# Patient Record
Sex: Female | Born: 1965
Health system: Southern US, Community
[De-identification: ages and names within clinical notes are randomized; demographics above are authoritative.]

## PROBLEM LIST (undated history)

## (undated) DIAGNOSIS — E079 Disorder of thyroid, unspecified: Secondary | ICD-10-CM

## (undated) DIAGNOSIS — A6920 Lyme disease, unspecified: Secondary | ICD-10-CM

## (undated) DIAGNOSIS — I10 Essential (primary) hypertension: Secondary | ICD-10-CM

## (undated) DIAGNOSIS — F419 Anxiety disorder, unspecified: Secondary | ICD-10-CM

## (undated) DIAGNOSIS — E7800 Pure hypercholesterolemia, unspecified: Secondary | ICD-10-CM

## (undated) HISTORY — PX: MASTOIDECTOMY: SHX711

## (undated) HISTORY — PX: ABDOMINAL EXPLORATION SURGERY: SHX538

## (undated) HISTORY — DX: Lyme disease, unspecified: A69.20

## (undated) HISTORY — PX: APPENDECTOMY: SHX54

## (undated) HISTORY — PX: TONSILECTOMY/ADENOIDECTOMY WITH MYRINGOTOMY: SHX6125

---

## 1997-12-02 ENCOUNTER — Inpatient Hospital Stay (HOSPITAL_COMMUNITY): Admission: AD | Admit: 1997-12-02 | Discharge: 1997-12-02 | Payer: Self-pay | Admitting: Obstetrics and Gynecology

## 2009-04-11 ENCOUNTER — Emergency Department (HOSPITAL_BASED_OUTPATIENT_CLINIC_OR_DEPARTMENT_OTHER): Admission: EM | Admit: 2009-04-11 | Discharge: 2009-04-12 | Payer: Self-pay | Admitting: Emergency Medicine

## 2009-04-12 ENCOUNTER — Ambulatory Visit: Payer: Self-pay | Admitting: Diagnostic Radiology

## 2010-10-09 LAB — URINALYSIS, ROUTINE W REFLEX MICROSCOPIC
Bilirubin Urine: NEGATIVE
Glucose, UA: NEGATIVE mg/dL
Hgb urine dipstick: NEGATIVE
Protein, ur: NEGATIVE mg/dL
Urobilinogen, UA: 0.2 mg/dL (ref 0.0–1.0)
pH: 6.5 (ref 5.0–8.0)

## 2010-10-09 LAB — BASIC METABOLIC PANEL
Chloride: 98 mEq/L (ref 96–112)
GFR calc Af Amer: >60 mL/min (ref >60)
Glucose, Bld: 94 mg/dL (ref 70–99)
Sodium: 136 mEq/L (ref 135–145)

## 2010-10-09 LAB — CBC
HCT: 41.5 % (ref 36.0–46.0)
Hemoglobin: 14.3 g/dL (ref 12.0–15.0)
Platelets: 306 10*3/uL (ref 150–400)
WBC: 7.5 10*3/uL (ref 4.0–10.5)

## 2010-10-09 LAB — DIFFERENTIAL
Basophils Relative: 1 % (ref 0–1)
Eosinophils Relative: 2 % (ref 0–5)

## 2010-10-09 LAB — WET PREP, GENITAL

## 2010-10-09 LAB — GC/CHLAMYDIA PROBE AMP, GENITAL: Chlamydia, DNA Probe: NEGATIVE

## 2011-06-17 ENCOUNTER — Ambulatory Visit: Payer: Self-pay | Admitting: Pulmonary Disease

## 2011-08-11 ENCOUNTER — Ambulatory Visit: Payer: Self-pay | Admitting: Pulmonary Disease

## 2014-08-25 ENCOUNTER — Emergency Department (HOSPITAL_BASED_OUTPATIENT_CLINIC_OR_DEPARTMENT_OTHER)
Admission: EM | Admit: 2014-08-25 | Discharge: 2014-08-25 | Disposition: A | Discharge status: Home / Self Care | Payer: 59 | Attending: Emergency Medicine | Admitting: Emergency Medicine

## 2014-08-25 ENCOUNTER — Encounter (HOSPITAL_BASED_OUTPATIENT_CLINIC_OR_DEPARTMENT_OTHER): Payer: Self-pay | Admitting: *Deleted

## 2014-08-25 DIAGNOSIS — Z79899 Other long term (current) drug therapy: Secondary | ICD-10-CM | POA: Insufficient documentation

## 2014-08-25 DIAGNOSIS — E079 Disorder of thyroid, unspecified: Secondary | ICD-10-CM | POA: Diagnosis not present

## 2014-08-25 DIAGNOSIS — R112 Nausea with vomiting, unspecified: Secondary | ICD-10-CM

## 2014-08-25 DIAGNOSIS — M791 Myalgia, unspecified site: Secondary | ICD-10-CM

## 2014-08-25 DIAGNOSIS — E875 Hyperkalemia: Secondary | ICD-10-CM | POA: Diagnosis not present

## 2014-08-25 HISTORY — DX: Disorder of thyroid, unspecified: E07.9

## 2014-08-25 LAB — CBC WITH DIFFERENTIAL/PLATELET
BASOS PCT: 0 % (ref 0–1)
Basophils Absolute: 0 10*3/uL (ref 0.0–0.1)
EOS ABS: 0.2 10*3/uL (ref 0.0–0.7)
Eosinophils Relative: 2 % (ref 0–5)
HCT: 44.2 % (ref 36.0–46.0)
HEMOGLOBIN: 15 g/dL (ref 12.0–15.0)
LYMPHS ABS: 2.2 10*3/uL (ref 0.7–4.0)
LYMPHS PCT: 20 % (ref 12–46)
MCH: 30.2 pg (ref 26.0–34.0)
MCHC: 33.9 g/dL (ref 30.0–36.0)
MCV: 88.9 fL (ref 78.0–100.0)
MONO ABS: 0.5 10*3/uL (ref 0.1–1.0)
Monocytes Relative: 5 % (ref 3–12)
Neutro Abs: 7.8 10*3/uL — ABNORMAL HIGH (ref 1.7–7.7)
Neutrophils Relative %: 73 % (ref 43–77)
Platelets: 320 10*3/uL (ref 150–400)
RBC: 4.97 MIL/uL (ref 3.87–5.11)
RDW: 13.5 % (ref 11.5–15.5)
WBC: 10.7 10*3/uL — ABNORMAL HIGH (ref 4.0–10.5)

## 2014-08-25 LAB — BASIC METABOLIC PANEL
ANION GAP: 4 — AB (ref 5–15)
BUN: 21 mg/dL (ref 6–23)
CALCIUM: 10 mg/dL (ref 8.4–10.5)
CO2: 31 mmol/L (ref 19–32)
Chloride: 102 mmol/L (ref 96–112)
Creatinine, Ser: 0.9 mg/dL (ref 0.50–1.10)
GFR calc non Af Amer: 74 mL/min — ABNORMAL LOW (ref >90)
GFR, EST AFRICAN AMERICAN: 86 mL/min — AB (ref >90)
GLUCOSE: 103 mg/dL — AB (ref 70–99)
POTASSIUM: 5.2 mmol/L — AB (ref 3.5–5.1)
Sodium: 137 mmol/L (ref 135–145)

## 2014-08-25 LAB — URINALYSIS, ROUTINE W REFLEX MICROSCOPIC
BILIRUBIN URINE: NEGATIVE
GLUCOSE, UA: NEGATIVE mg/dL
HGB URINE DIPSTICK: NEGATIVE
Ketones, ur: NEGATIVE mg/dL
LEUKOCYTES UA: NEGATIVE
Nitrite: NEGATIVE
PH: 6.5 (ref 5.0–8.0)
PROTEIN: NEGATIVE mg/dL
Specific Gravity, Urine: 1.006 (ref 1.005–1.030)
UROBILINOGEN UA: 0.2 mg/dL (ref 0.0–1.0)

## 2014-08-25 LAB — MONONUCLEOSIS SCREEN: Mono Screen: NEGATIVE

## 2014-08-25 MED ORDER — PROMETHAZINE HCL 25 MG PO TABS: 25.0000 mg | ORAL_TABLET | Freq: Four times a day (QID) | ORAL | Status: DC | PRN

## 2014-08-25 MED ORDER — PROMETHAZINE HCL 25 MG PO TABS
25.0000 mg | ORAL_TABLET | Freq: Once | ORAL | Status: AC
  Administered 2014-08-25: 25 mg via ORAL
  Filled 2014-08-25: qty 1

## 2014-08-25 NOTE — ED Notes (Signed)
C/o weakness and vomiting. Strep throat 2 weeks ago. Weakness started Tuesday. Pt was zithromax for strep throat. Also c/o diarrhea.

## 2014-08-25 NOTE — ED Provider Notes (Signed)
CSN: [id number]     Arrival date & time 08/25/14  1625 History   This chart was scribed for No att. providers found by Jarvis Morgan, ED Scribe. This patient was seen in room MH08/MH08 and the patient's care was started at 12:14 AM.    Chief Complaint  Patient presents with  . Emesis    Patient is a 49 y.o. female presenting with vomiting. The history is provided by the patient. No language interpreter was used.  Emesis Severity:  Moderate Duration:  2 weeks Timing:  Intermittent Able to tolerate:  Liquids Progression:  Worsening Chronicity:  New Recent urination:  Normal Relieved by:  Nothing Worsened by:  Nothing tried Ineffective treatments:  None tried Associated symptoms: chills, cough, diarrhea, headaches, myalgias and sore throat   Associated symptoms: no abdominal pain, no arthralgias, no fever and no URI     HPI Comments: Jenna Arroyo is a 49 y.o. female who presents to the Emergency Department complaining of episodic emesis for 2 weeks. Pt states that 2 weeks ago she had strep throat and was given Zithromax. She was having associated headaches, emesis, and diarrhea. She states that she took the Zithromax for 4 days and started feeling great again and then she states that about 4 days ago she began feeling bad again. She is currently having the same symptoms as she had previously along with fatigue, chills, subjective fever, mild non productive cough, generalized body aches. Her last episode of emesis was last night She was given a refill of 500 mg Zithromax and began to feel her previous symptoms again and having diaphoresis and heart palpitations. She went off her thyroid medication to see if that helped at all with how she felt. She also notes she has been having spiking BP readings. 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 She has been having some sick contacts at work. She denies any other complaints at this time.   Past Medical History   Diagnosis Date  . Thyroid disease    History reviewed. No pertinent past surgical history. No family history on file. History  Substance Use Topics  . Smoking status: Never Smoker   . Smokeless tobacco: Not on file  . Alcohol Use: Not on file   OB History    No data available     Review of Systems  Constitutional: Positive for chills and fatigue. Negative for fever, diaphoresis, activity change and appetite change.  HENT: Positive for sore throat. Negative for congestion, facial swelling and rhinorrhea.   Eyes: Negative for photophobia and discharge.  Respiratory: Positive for cough. Negative for chest tightness and shortness of breath.   Cardiovascular: Negative for chest pain, palpitations and leg swelling.  Gastrointestinal: Positive for vomiting and diarrhea. Negative for nausea and abdominal pain.  Endocrine: Negative for polydipsia and polyuria.  Genitourinary: Negative for dysuria, frequency, difficulty urinating and pelvic pain.  Musculoskeletal: Positive for myalgias. Negative for back pain, arthralgias, neck pain and neck stiffness.  Skin: Negative for color change and wound.  Allergic/Immunologic: Negative for immunocompromised state.  Neurological: Positive for light-headedness and headaches. Negative for facial asymmetry, weakness and numbness.  Hematological: Does not bruise/bleed easily.  Psychiatric/Behavioral: Negative for confusion and agitation.      Allergies  Rocephin  Home Medications   Prior to Admission medications   Medication Sig Start Date End Date Taking? Authorizing Provider  ALPRAZolam Prudy Feeler) 0.5 MG tablet Take 0.5 mg by mouth 3 (three) times daily  as needed for anxiety.   Yes Historical Provider, MD  atenolol (TENORMIN) 25 MG tablet Take 25 mg by mouth 2 (two) times daily.   Yes Historical Provider, MD  DULoxetine (CYMBALTA) 60 MG capsule Take 60 mg by mouth daily.   Yes Historical Provider, MD  thyroid (ARMOUR) 32.5 MG tablet Take 32.5 mg  by mouth daily.   Yes Historical Provider, MD  promethazine (PHENERGAN) 25 MG tablet Take 1 tablet (25 mg total) by mouth every 6 (six) hours as needed for nausea or vomiting. 08/25/14   Toy Cookey, MD   Triage Vitals: BP 139/105 mmHg  Pulse 73  Temp(Src) 98.2 F (36.8 C) (Oral)  Resp 16  Ht  (1.676 m)  Wt 204 lb (92.534 kg)  BMI 32.94 kg/m2  SpO2 100%  LMP   Physical Exam  Constitutional: She is oriented to person, place, and time. She appears well-developed and well-nourished. No distress.  HENT:  Head: Normocephalic.  Mouth/Throat: Oropharynx is clear and moist.  Small effusion behind right TM  Eyes: Pupils are equal, round, and reactive to light.  Neck: Neck supple.  Cardiovascular: Normal rate, regular rhythm and normal heart sounds.   Pulmonary/Chest: Effort normal and breath sounds normal. No respiratory distress. She has no wheezes.  Abdominal: Soft. She exhibits no distension. There is no tenderness. There is no rebound and no guarding.  Musculoskeletal: She exhibits no edema or tenderness.  Lymphadenopathy:    She has cervical adenopathy.  Neurological: She is alert and oriented to person, place, and time.  Skin: Skin is warm and dry.  Psychiatric: She has a normal mood and affect.  Nursing note and vitals reviewed.   ED Course  Procedures (including critical care time)  DIAGNOSTIC STUDIES: Oxygen Saturation is 100% on RA, normal by my interpretation.    COORDINATION OF CARE: 8:24 PM- Will order diagnostic lab work and 12-lead EKG.  Pt advised of plan for treatment and pt agrees.     Labs Review Labs Reviewed  CBC WITH DIFFERENTIAL/PLATELET - Abnormal; Notable for the following:    WBC 10.7 (*)    Neutro Abs 7.8 (*)    All other components within normal limits  BASIC METABOLIC PANEL - Abnormal; Notable for the following:    Potassium 5.2 (*)    Glucose, Bld 103 (*)    GFR calc non Af Amer 74 (*)    GFR calc Af Amer 86 (*)    Anion gap 4 (*)     All other components within normal limits  URINE CULTURE  URINALYSIS, ROUTINE W REFLEX MICROSCOPIC  MONONUCLEOSIS SCREEN  TSH  T4, FREE  T4, FREE    Imaging Review No results found.   EKG Interpretation   Date/Time:  Saturday August 25 2014 18:33:24 EST Ventricular Rate:  66 PR Interval:  114 QRS Duration: 88 QT Interval:  382 QTC Calculation: 400 R Axis:   52 Text Interpretation:  Normal sinus rhythm Normal ECG No prior for  comparison Confirmed by Devaney Segers  MD, Elzie Sheets ([id number]) on 08/25/2014 7:30:01 PM      MDM   Final diagnoses:  Non-intractable vomiting with nausea, vomiting of unspecified type  Myalgia  Hyperkalemia    Patient presents with multiple complaints including intermittent vomiting, generalized weakness, malaise.  She had strep throat 2 weeks ago and was treated with azithromycin.  She is also had some intermittent loose stools.  She denies fevers.  On physical exam, vital signs are stable.  She is no acute  distress.  Physical exam grossly unremarkable.  Monospot is negative.  CBC with mild leukocytosis.  BMP with a K of 5.2 without hemolysis, but a normal creatinine.  She has had similar potassiums in the past.  She has no EKG changes.  I suspect an acute viral syndrome.  We'll discharge home with instructions for supportive care as well as Phenergan for nausea or vomiting.  Asked her to follow-up with her primary care doctor for a repeat potassium in one week.   .  She'll follow-up with her primary doctor for results of TSH and free T4  Toy CookeyMegan Szymon Foiles, MD 08/26/14 [id number]

## 2014-08-25 NOTE — ED Notes (Signed)
Pt alert x4 respirations easy non labored.  

## 2014-08-25 NOTE — ED Notes (Signed)
Pt alert x4 mae randomly. Pt c/o generalized fatigue

## 2014-08-25 NOTE — Discharge Instructions (Signed)

## 2014-08-26 LAB — TSH: TSH: 4.116 u[IU]/mL (ref 0.350–4.500)

## 2014-08-26 LAB — T4, FREE: FREE T4: 0.99 ng/dL (ref 0.80–1.80)

## 2014-08-27 LAB — URINE CULTURE

## 2014-08-30 LAB — T4, FREE

## 2015-11-07 DIAGNOSIS — Z23 Encounter for immunization: Secondary | ICD-10-CM | POA: Diagnosis not present

## 2015-12-12 DIAGNOSIS — J45909 Unspecified asthma, uncomplicated: Secondary | ICD-10-CM | POA: Diagnosis not present

## 2015-12-12 DIAGNOSIS — J209 Acute bronchitis, unspecified: Secondary | ICD-10-CM | POA: Diagnosis not present

## 2016-02-06 DIAGNOSIS — J029 Acute pharyngitis, unspecified: Secondary | ICD-10-CM | POA: Diagnosis not present

## 2016-03-12 DIAGNOSIS — F411 Generalized anxiety disorder: Secondary | ICD-10-CM | POA: Diagnosis not present

## 2016-04-02 DIAGNOSIS — M2550 Pain in unspecified joint: Secondary | ICD-10-CM | POA: Diagnosis not present

## 2016-04-02 DIAGNOSIS — M545 Low back pain: Secondary | ICD-10-CM | POA: Diagnosis not present

## 2016-04-02 DIAGNOSIS — I119 Hypertensive heart disease without heart failure: Secondary | ICD-10-CM | POA: Diagnosis not present

## 2016-04-02 DIAGNOSIS — E784 Other hyperlipidemia: Secondary | ICD-10-CM | POA: Diagnosis not present

## 2016-05-06 DIAGNOSIS — F329 Major depressive disorder, single episode, unspecified: Secondary | ICD-10-CM | POA: Diagnosis not present

## 2016-05-06 DIAGNOSIS — F419 Anxiety disorder, unspecified: Secondary | ICD-10-CM | POA: Diagnosis not present

## 2016-05-06 DIAGNOSIS — I10 Essential (primary) hypertension: Secondary | ICD-10-CM | POA: Diagnosis not present

## 2016-05-06 DIAGNOSIS — R112 Nausea with vomiting, unspecified: Secondary | ICD-10-CM | POA: Diagnosis not present

## 2016-05-06 DIAGNOSIS — R079 Chest pain, unspecified: Secondary | ICD-10-CM | POA: Diagnosis not present

## 2016-05-06 DIAGNOSIS — Z885 Allergy status to narcotic agent status: Secondary | ICD-10-CM | POA: Diagnosis not present

## 2016-05-06 DIAGNOSIS — E039 Hypothyroidism, unspecified: Secondary | ICD-10-CM | POA: Diagnosis not present

## 2016-05-06 DIAGNOSIS — R0789 Other chest pain: Secondary | ICD-10-CM | POA: Diagnosis not present

## 2016-05-06 DIAGNOSIS — Z780 Asymptomatic menopausal state: Secondary | ICD-10-CM | POA: Diagnosis not present

## 2016-05-06 DIAGNOSIS — R002 Palpitations: Secondary | ICD-10-CM | POA: Diagnosis not present

## 2016-05-06 DIAGNOSIS — E782 Mixed hyperlipidemia: Secondary | ICD-10-CM | POA: Diagnosis not present

## 2016-05-12 DIAGNOSIS — E784 Other hyperlipidemia: Secondary | ICD-10-CM | POA: Diagnosis not present

## 2016-05-12 DIAGNOSIS — E559 Vitamin D deficiency, unspecified: Secondary | ICD-10-CM | POA: Diagnosis not present

## 2016-05-12 DIAGNOSIS — R5382 Chronic fatigue, unspecified: Secondary | ICD-10-CM | POA: Diagnosis not present

## 2016-05-12 DIAGNOSIS — I509 Heart failure, unspecified: Secondary | ICD-10-CM | POA: Diagnosis not present

## 2016-05-13 DIAGNOSIS — F418 Other specified anxiety disorders: Secondary | ICD-10-CM | POA: Diagnosis not present

## 2016-05-13 DIAGNOSIS — R5383 Other fatigue: Secondary | ICD-10-CM | POA: Diagnosis not present

## 2016-05-13 DIAGNOSIS — I10 Essential (primary) hypertension: Secondary | ICD-10-CM | POA: Diagnosis not present

## 2016-05-13 DIAGNOSIS — E785 Hyperlipidemia, unspecified: Secondary | ICD-10-CM | POA: Diagnosis not present

## 2016-05-13 DIAGNOSIS — E559 Vitamin D deficiency, unspecified: Secondary | ICD-10-CM | POA: Diagnosis not present

## 2016-05-13 DIAGNOSIS — M2550 Pain in unspecified joint: Secondary | ICD-10-CM | POA: Diagnosis not present

## 2016-05-13 DIAGNOSIS — E063 Autoimmune thyroiditis: Secondary | ICD-10-CM | POA: Diagnosis not present

## 2016-05-13 DIAGNOSIS — E039 Hypothyroidism, unspecified: Secondary | ICD-10-CM | POA: Diagnosis not present

## 2016-05-25 DIAGNOSIS — E042 Nontoxic multinodular goiter: Secondary | ICD-10-CM | POA: Diagnosis not present

## 2016-05-25 DIAGNOSIS — E063 Autoimmune thyroiditis: Secondary | ICD-10-CM | POA: Diagnosis not present

## 2016-06-18 ENCOUNTER — Ambulatory Visit (INDEPENDENT_AMBULATORY_CARE_PROVIDER_SITE_OTHER): Payer: BLUE CROSS/BLUE SHIELD | Admitting: Family Medicine

## 2016-06-18 ENCOUNTER — Encounter: Payer: Self-pay | Admitting: Family Medicine

## 2016-06-18 VITALS — BP 142/92 | HR 74 | Temp 98.2°F | Ht 66.0 in | Wt 227.2 lb

## 2016-06-18 DIAGNOSIS — I10 Essential (primary) hypertension: Secondary | ICD-10-CM | POA: Insufficient documentation

## 2016-06-18 DIAGNOSIS — E039 Hypothyroidism, unspecified: Secondary | ICD-10-CM | POA: Diagnosis not present

## 2016-06-18 DIAGNOSIS — E785 Hyperlipidemia, unspecified: Secondary | ICD-10-CM | POA: Diagnosis not present

## 2016-06-18 DIAGNOSIS — R1013 Epigastric pain: Secondary | ICD-10-CM

## 2016-06-18 NOTE — Progress Notes (Addendum)
[facility] [address], [address] [place], [place] [address] [phone] [phone]  Date:  06/18/2016   Name:  Jenna Arroyo   DOB:  10/17/1965   MRN:  [id number]  PCP:  Abbe AmsterdamOPLAND,Deshara Rossi, MD    Chief Complaint: Establish Care   History of Present Illness:  Jenna Arroyo is a 50 y.o. very pleasant female patient who presents with the following:  Here today to establish care- [place] too far [place] for her She has been seeing a cardiologist for her lipids- we think with [facility] She has had a lot of trouble with statins in the past- she is going to try pravastatin, and if she fails this they will try an injectable.  She had a stress last spring which was normal.  She had lyme disease in the past- ever since then she seemed to have an underactive thyroid.  She has acquired hypothyroidism. This is managed per [facility]- apparently they are also "trying to get a positive lyme test so they can treat me for chronic lyme."    She did not take her atenolol this morning  She takes cymbalta, and she is trying to taper off her xanax. She is taking 3 a day right now- down from a high of 5 pills  She has dx of "hiatal hernia." She notes epigastric pains and more "acid in my stomach," she will feel bloated after eating.  She is not on any anti-reflux medications.  She has tried tums, and zantac on occasion This has bothered her more for 2-3 weeks but has been chronic off and on Pt reports that her chiropractor diagnosed her hiatal hernia many years ago by feeling her stomach- she did not know that this cannot be reliably dx on physical exam. Never had any imaging that confirmed this hernia.   Decided to get an h pylori breath test for her and treat accordingly      There are no active problems to display for this patient.   Past Medical History:  Diagnosis Date  . Thyroid disease     History  reviewed. No pertinent surgical history.  Social History  Substance Use Topics  . Smoking status: Never Smoker  . Smokeless tobacco: Never Used  . Alcohol use No    Family History  Problem Relation Age of Onset  . Hypertension Mother   . Heart disease Father   . Hypertension Sister   . Hypertension Brother     Allergies  Allergen Reactions  . Ceftriaxone Sodium In Dextrose Other (See Comments)    "heart races and cannot breathe"  . Guaifenesin Other (See Comments)    "heart races, dizziness, light-headedness"  . Guaifenesin & Derivatives Other (See Comments)    "heart races, dizziness, light-headedness"  . Meperidine Other (See Comments)    "heart races"  . Other Rash  . Pseudoephedrine Other (See Comments)    "heart races and high blood pressure"  . Rosuvastatin Calcium Nausea And Vomiting    Muscle aches   . Simvastatin Other (See Comments)    muscle aching   . Ciprofloxacin   . Rocephin [Ceftriaxone] Itching    Medication list has been reviewed and updated.  Current Outpatient Prescriptions on File Prior to Visit  Medication Sig Dispense Refill  . ALPRAZolam (XANAX) 0.5 MG tablet Take 0.5 mg by mouth 3 (three) times daily as needed for anxiety.    .Marland Kitchen  atenolol (TENORMIN) 25 MG tablet Take 25 mg by mouth 2 (two) times daily.    . DULoxetine (CYMBALTA) 60 MG capsule Take 60 mg by mouth daily.    . promethazine (PHENERGAN) 25 MG tablet Take 1 tablet (25 mg total) by mouth every 6 (six) hours as needed for nausea or vomiting. 15 tablet 0  . thyroid (ARMOUR) 32.5 MG tablet Take 32.5 mg by mouth daily.     No current facility-administered medications on file prior to visit.     Review of Systems:  As per HPI- otherwise negative.   Physical Examination: Vitals:   06/18/16 1529  BP: (!) 142/92  Pulse: 74  Temp: 98.2 F (36.8 C)   Vitals:   06/18/16 1529  Weight: 227 lb 3.2 oz (103.1 kg)  Height: 5\' 6"  (1.676 m)   Body mass index is 36.67 kg/m. Ideal  Body Weight: Weight in (lb) to have BMI = 25: 154.6  GEN: WDWN, NAD, Non-toxic, A & O x 3, obese, looks well HEENT: Atraumatic, Normocephalic. Neck supple. No masses, No LAD.  Bilateral TM wnl, oropharynx normal.  PEERL,EOMI.   Ears and Nose: No external deformity. CV: RRR, No M/G/R. No JVD. No thrill. No extra heart sounds. PULM: CTA B, no wheezes, crackles, rhonchi. No retractions. No resp. distress. No accessory muscle use. ABD: S, NT, ND, +BS. No rebound. No HSM. EXTR: No c/c/e NEURO Normal gait.  PSYCH: Normally interactive. Conversant. Not depressed or anxious appearing.  Calm demeanor.    Assessment and Plan: Abdominal pain, epigastric - Plan: H. pylori breath test  Dyslipidemia  Essential hypertension, benign  Acquired hypothyroidism  Here today as a new patient as has last PCP have to move too far away Her BP is under reasonable control- continue current regimen It seems that she was dx with hiatal hernia on a physical exam only years ago by her chiropractor. Advised pt that per my knowledge this cannot be dx on physical exam alone. Will do an H pylori due to her epigastric symptoms. If negative would suggest that we have her see GI. She is ok with this plan   Signed Abbe AmsterdamJessica Kamaile Zachow, MD  Results for orders placed or performed in visit on 06/18/16  H. pylori breath test  Result Value Ref Range   H. pylori Breath Test NOT DETECTED Not Detected   Negative H pylori- called pt on 12/20. Will refer to GI

## 2016-06-18 NOTE — Progress Notes (Signed)
Pre visit review using our clinic review tool, if applicable. No additional management support is needed unless otherwise documented below in the visit note. 

## 2016-06-18 NOTE — Patient Instructions (Signed)
It was very nice to see you today- let me know if you need anything, but it sounds like your thyroid and cholesterol are being managed by [facility] We will do a breath test today to look for H pylori; this is a bacteria that can cause gastritis and ulcers.  If this is negative I would recommend that we have you see GI to evaluate your abdominal pain. You might need an upper GI scope to determine if you actually have a hiatal hernia   I will be in touch with your breath test results asap

## 2016-06-19 LAB — H. PYLORI BREATH TEST: H. PYLORI BREATH TEST: NOT DETECTED

## 2016-06-24 NOTE — Addendum Note (Signed)
Addended by: Abbe AmsterdamOPLAND, Winna Golla C on: 06/24/2016 12:40 PM   Modules accepted: Orders

## 2016-06-25 ENCOUNTER — Encounter: Payer: Self-pay | Admitting: Family Medicine

## 2016-06-25 DIAGNOSIS — R1013 Epigastric pain: Secondary | ICD-10-CM | POA: Diagnosis not present

## 2016-06-25 DIAGNOSIS — Z1211 Encounter for screening for malignant neoplasm of colon: Secondary | ICD-10-CM | POA: Diagnosis not present

## 2016-06-25 NOTE — Telephone Encounter (Signed)
error:[id number] ° °

## 2016-06-28 DIAGNOSIS — M5432 Sciatica, left side: Secondary | ICD-10-CM | POA: Diagnosis not present

## 2016-06-28 DIAGNOSIS — S39012A Strain of muscle, fascia and tendon of lower back, initial encounter: Secondary | ICD-10-CM | POA: Diagnosis not present

## 2016-07-09 DIAGNOSIS — G8929 Other chronic pain: Secondary | ICD-10-CM | POA: Diagnosis not present

## 2016-07-09 DIAGNOSIS — M25561 Pain in right knee: Secondary | ICD-10-CM | POA: Diagnosis not present

## 2016-07-17 ENCOUNTER — Encounter: Payer: Self-pay | Admitting: Family Medicine

## 2016-07-17 ENCOUNTER — Ambulatory Visit (INDEPENDENT_AMBULATORY_CARE_PROVIDER_SITE_OTHER): Payer: BLUE CROSS/BLUE SHIELD | Admitting: Family Medicine

## 2016-07-17 VITALS — BP 128/56 | HR 89 | Temp 98.3°F | Wt 230.8 lb

## 2016-07-17 DIAGNOSIS — J111 Influenza due to unidentified influenza virus with other respiratory manifestations: Secondary | ICD-10-CM

## 2016-07-17 MED ORDER — BENZONATATE 100 MG PO CAPS: 100.0000 mg | ORAL_CAPSULE | Freq: Three times a day (TID) | ORAL | 0 refills | Status: DC | PRN

## 2016-07-17 MED ORDER — OSELTAMIVIR PHOSPHATE 75 MG PO CAPS: 75.0000 mg | ORAL_CAPSULE | Freq: Two times a day (BID) | ORAL | 0 refills | Status: DC

## 2016-07-17 NOTE — Progress Notes (Signed)
Chief Complaint  Patient presents with  . Flu like symptom    Body aches, sore throat, chills, ear ache. Doesn't know if she has ran a fever.    Jenna GessLisa M Arroyo here for URI complaints.  Duration: 1 day  Associated symptoms: ear pain, sore throat, chills, myalgia and cough Denies: itchy watery eyes, ear drainage and shortness of breath Treatment to date: ibuprofen Sick contacts: Yes- father was dx'd with PNA  ROS:  Const: +chills, unsure about fever HEENT: As noted in HPI Lungs: No SOB  Past Medical History:  Diagnosis Date  . Thyroid disease    Family History  Problem Relation Age of Onset  . Hypertension Mother   . Heart disease Father   . Hypertension Sister   . Hypertension Brother     BP (!) 128/56 (BP Location: Right Arm, Patient Position: Sitting, Cuff Size: Large)   Pulse 89   Temp 98.3 F (36.8 C) (Oral)   Wt 230 lb 12.8 oz (104.7 kg)   SpO2 98% Comment: RA  BMI 37.25 kg/m  General: Awake, alert, appears stated age HEENT: AT, [place], ears patent b/l and TM's neg, nares patent w/o discharge, pharynx pink and without exudates, MMM Neck: No masses or asymmetry Heart: RRR, no murmurs, no bruits Lungs: CTAB, no accessory muscle use Skin: Diaphoretic Psych: Age appropriate judgment and insight, normal mood and affect  Influenza - Plan: oseltamivir (TAMIFLU) 75 MG capsule, benzonatate (TESSALON) 100 MG capsule  Orders as above. Continue to push fluids, practice good hand hygiene, cover mouth when coughing. Letter for work given. F/u in 1 week if symptoms worsen or fail to improve. Pt voiced understanding and agreement to the plan.  Jenna Arroyo [place], DO 07/17/16 10:22 AM

## 2016-07-17 NOTE — Patient Instructions (Signed)
Keep pushing fluids, cover your mouth when you cough, keep practicing good hand hygiene.   Ibuprofen and Tylenol for muscle aches and chills.

## 2016-07-17 NOTE — Progress Notes (Signed)
Pre visit review using our clinic review tool, if applicable. No additional management support is needed unless otherwise documented below in the visit note. 

## 2016-07-19 DIAGNOSIS — J209 Acute bronchitis, unspecified: Secondary | ICD-10-CM | POA: Diagnosis not present

## 2016-07-20 ENCOUNTER — Telehealth: Payer: Self-pay | Admitting: Family Medicine

## 2016-07-20 NOTE — Telephone Encounter (Signed)
Caller name: Relationship to patient: Self Can be reached: [phone]  Pharmacy:  Reason for call: Patient saw Dr. Carmelia RollerWendling last week and was told to call if she needed a work note. Patient request note for work be faxed to [phone] ATTN: Teressa LowerJackie Kennedy. Patient is coming in tomorrow to see Ramon Dredgedward but has been out of work for 3 days.

## 2016-07-20 NOTE — Telephone Encounter (Signed)
OK to give letter excusing through Monday. TY.

## 2016-07-20 NOTE — Telephone Encounter (Signed)
Letter faxed to [phone]

## 2016-07-21 ENCOUNTER — Ambulatory Visit (HOSPITAL_BASED_OUTPATIENT_CLINIC_OR_DEPARTMENT_OTHER)
Admission: RE | Admit: 2016-07-21 | Discharge: 2016-07-21 | Disposition: A | Discharge status: Home / Self Care | Payer: BLUE CROSS/BLUE SHIELD | Source: Ambulatory Visit | Attending: Medical | Admitting: Medical

## 2016-07-21 ENCOUNTER — Telehealth: Payer: Self-pay | Admitting: Medical

## 2016-07-21 ENCOUNTER — Ambulatory Visit (INDEPENDENT_AMBULATORY_CARE_PROVIDER_SITE_OTHER): Payer: BLUE CROSS/BLUE SHIELD | Admitting: Medical

## 2016-07-21 ENCOUNTER — Telehealth: Payer: Self-pay | Admitting: *Deleted

## 2016-07-21 ENCOUNTER — Encounter: Payer: Self-pay | Admitting: Medical

## 2016-07-21 VITALS — BP 126/67 | HR 82 | Temp 98.5°F | Resp 16 | Ht 66.0 in | Wt 226.5 lb

## 2016-07-21 DIAGNOSIS — J209 Acute bronchitis, unspecified: Secondary | ICD-10-CM

## 2016-07-21 DIAGNOSIS — R05 Cough: Secondary | ICD-10-CM | POA: Diagnosis not present

## 2016-07-21 DIAGNOSIS — R509 Fever, unspecified: Secondary | ICD-10-CM | POA: Insufficient documentation

## 2016-07-21 DIAGNOSIS — R6889 Other general symptoms and signs: Secondary | ICD-10-CM

## 2016-07-21 DIAGNOSIS — J029 Acute pharyngitis, unspecified: Secondary | ICD-10-CM

## 2016-07-21 DIAGNOSIS — R059 Cough, unspecified: Secondary | ICD-10-CM

## 2016-07-21 LAB — CBC WITH DIFFERENTIAL/PLATELET
BASOS ABS: 0 10*3/uL (ref 0.0–0.1)
BASOS PCT: 0.3 % (ref 0.0–3.0)
EOS ABS: 0.3 10*3/uL (ref 0.0–0.7)
Eosinophils Relative: 3.8 % (ref 0.0–5.0)
HCT: 40.4 % (ref 36.0–46.0)
Hemoglobin: 14.1 g/dL (ref 12.0–15.0)
LYMPHS PCT: 20.5 % (ref 12.0–46.0)
Lymphs Abs: 1.5 10*3/uL (ref 0.7–4.0)
MCHC: 34.9 g/dL (ref 30.0–36.0)
MCV: 88.4 fl (ref 78.0–100.0)
Monocytes Absolute: 0.6 10*3/uL (ref 0.1–1.0)
Monocytes Relative: 8.4 % (ref 3.0–12.0)
NEUTROS ABS: 4.9 10*3/uL (ref 1.4–7.7)
Neutrophils Relative %: 67 % (ref 43.0–77.0)
PLATELETS: 304 10*3/uL (ref 150.0–400.0)
RBC: 4.57 Mil/uL (ref 3.87–5.11)
RDW: 13.6 % (ref 11.5–15.5)
WBC: 7.4 10*3/uL (ref 4.0–10.5)

## 2016-07-21 LAB — POCT INFLUENZA A: RAPID INFLUENZA A AGN: NEGATIVE

## 2016-07-21 LAB — POCT RAPID STREP A (OFFICE): Rapid Strep A Screen: NEGATIVE

## 2016-07-21 MED ORDER — HYDROCOD POLST-CPM POLST ER 10-8 MG/5ML PO SUER: 5.0000 mL | Freq: Two times a day (BID) | ORAL | 0 refills | Status: DC | PRN

## 2016-07-21 MED ORDER — AMOXICILLIN 875 MG PO TABS
875.0000 mg | ORAL_TABLET | Freq: Two times a day (BID) | ORAL | 0 refills | Status: DC
Start: 2016-07-21 — End: 2016-07-24

## 2016-07-21 NOTE — Telephone Encounter (Signed)
tyring to close pt note. Will you result her test. Thanks

## 2016-07-21 NOTE — Telephone Encounter (Signed)
Amoxicillin Rx printed this Am; did not go to pharmacy, phoned Rx to pharmacy and spoke with patient to inform her, verified that she has no problem with Amoxicillin/SLS 01/16

## 2016-07-21 NOTE — Patient Instructions (Addendum)
Your rapid flu and strep test is negative. Send out throat culture pending.  For your cough will rx tussionex. Stop benzonatate.  Will get cbc an cxr.   You have extensive allergy and side effect history to cephalosporin, sulfa, quinolones, doxycycline and azithromycin. Possible to amoxicillin as well as this is standard treatment for lyme as you report having been treated for.  Will follow you stat chest xray and cbc results and decide if you need antibiotic and what type?  Rest hydrate and tylenol for fever.  Follow up in 7 days or as needed   Note pt at the end of exam stated she had reaction to rocephin 10 yrs ago. She states has used amoxicillin various times since then with no reaction. She expresses strong confidence that she can take. Pt wbc was not elevated and her cxr did not show pneumonia. She may have some persisting secondar sinus infection and bronchitis post flu like syndrome. So will rx amoxicillin.

## 2016-07-21 NOTE — Progress Notes (Signed)
Pre visit review using our clinic review tool, if applicable. No additional management support is needed unless otherwise documented below in the visit note/SLS  

## 2016-07-21 NOTE — Progress Notes (Signed)
Subjective:    Patient ID: Jenna Arroyo, female    DOB: 05-27-1966, 51 y.o.   MRN: [id number]  HPI  Pt in for evaluation. Pt had one day of body aches, chills and ear pain before saw Dr. Carmelia Arroyo. Pt treated with benzonatate. No tamiflu given per pt.   Pt then went to UC and was given doxycycline but she could not tolerate the med. She states vomited with doxycycline. Pt took with food.  Pt overall sick for 4-5 days. Has transient fever. Temp 100.3 on and off. Pt has chest congestion, sinus pressure and pain. Also some pnd and productive cough. Pt is fatigued. Pt had body aches before seen first time.  Pt has very extensive allergy history to various antibiotics. Mentions azithromycin. Also reaction to antibiotic that was once given for lyme disease(she thinks may have been doxy)    Review of Systems  Constitutional: Positive for chills, fatigue and fever.  HENT: Positive for congestion, sinus pain, sinus pressure and sore throat. Negative for trouble swallowing.   Respiratory: Positive for cough. Negative for choking, shortness of breath and wheezing.   Cardiovascular: Negative for chest pain and palpitations.  Gastrointestinal: Negative for abdominal pain, constipation, diarrhea, nausea and vomiting.  Musculoskeletal: Positive for myalgias. Negative for arthralgias and back pain.  Neurological: Negative for dizziness, seizures and headaches.  Hematological: Negative for adenopathy. Does not bruise/bleed easily.  Psychiatric/Behavioral: Negative for behavioral problems, confusion and dysphoric mood.    Past Medical History:  Diagnosis Date  . Thyroid disease      Social History   Social History  . Marital status: Divorced    Spouse name: N/A  . Number of children: N/A  . Years of education: N/A   Occupational History  . Not on file.   Social History Main Topics  . Smoking status: Never Smoker  . Smokeless tobacco: Never Used  . Alcohol use No  . Drug use: No  .  Sexual activity: No   Other Topics Concern  . Not on file   Social History Narrative  . No narrative on file    No past surgical history on file.  Family History  Problem Relation Age of Onset  . Hypertension Mother   . Heart disease Father   . Hypertension Sister   . Hypertension Brother     Allergies  Allergen Reactions  . Ceftriaxone Sodium In Dextrose Other (See Comments)    "heart races and cannot breathe"  . Guaifenesin Other (See Comments)    "heart races, dizziness, light-headedness"  . Guaifenesin & Derivatives Other (See Comments)    "heart races, dizziness, light-headedness"  . Meperidine Other (See Comments)    "heart races"  . Other Rash  . Pseudoephedrine Other (See Comments)    "heart races and high blood pressure"  . Rosuvastatin Calcium Nausea And Vomiting    Muscle aches   . Simvastatin Other (See Comments)    muscle aching   . Ciprofloxacin   . Rocephin [Ceftriaxone] Itching    Current Outpatient Prescriptions on File Prior to Visit  Medication Sig Dispense Refill  . ALPRAZolam (XANAX) 0.5 MG tablet Take 0.5 mg by mouth 3 (three) times daily as needed for anxiety.    Marland Kitchen atenolol (TENORMIN) 25 MG tablet Take 25 mg by mouth 2 (two) times daily.    . DULoxetine (CYMBALTA) 60 MG capsule Take 60 mg by mouth daily.    . irbesartan (AVAPRO) 150 MG tablet Take 0.5 tablets by mouth daily.  Take half tablet daily  6  . promethazine (PHENERGAN) 25 MG tablet Take 1 tablet (25 mg total) by mouth every 6 (six) hours as needed for nausea or vomiting. 15 tablet 0  . thyroid (ARMOUR) 32.5 MG tablet Take 32.5 mg by mouth daily.     No current facility-administered medications on file prior to visit.     BP 126/67 (BP Location: Right Arm, Patient Position: Sitting, Cuff Size: Large)   Pulse 82   Temp 98.5 F (36.9 C) (Oral)   Resp 16   Ht 5\' 6"  (1.676 m)   Wt 226 lb 8 oz (102.7 kg)   SpO2 96%   BMI 36.56 kg/m       Objective:   Physical  Exam  General  Mental Status - Alert. General Appearance - Well groomed. Not in acute distress.  Skin Rashes- No Rashes.  HEENT Head- Normal. Ear Auditory Canal - Left- Normal. Right - Normal.Tympanic Membrane- Left- Normal. Right- Normal. Eye Sclera/Conjunctiva- Left- Normal. Right- Normal. Nose & Sinuses Nasal Mucosa- Left-  Boggy and Congested. Right-  Boggy and  Congested.Bilateral maxillary and frontal sinus pressure. Mouth & Throat Lips: Upper Lip- Normal: no dryness, cracking, pallor, cyanosis, or vesicular eruption. Lower Lip-Normal: no dryness, cracking, pallor, cyanosis or vesicular eruption. Buccal Mucosa- Bilateral- No Aphthous ulcers. Oropharynx- No Discharge or Erythema. Tonsils: Characteristics- Bilateral- No Erythema or Congestion. Size/Enlargement- Bilateral- No enlargement. Discharge- bilateral-None.  Neck Neck- Supple. No Masses.   Chest and Lung Exam Auscultation: Breath Sounds:-Clear even and unlabored.  Cardiovascular Auscultation:Rythm- Regular, rate and rhythm. Murmurs & Other Heart Sounds:Ausculatation of the heart reveal- No Murmurs.  Lymphatic Head & Neck General Head & Neck Lymphatics: Bilateral: Description- No Localized lymphadenopathy.       Assessment & Plan:  Your rapid flu and strep test is negative. Send out throat culture pending.  For your cough will rx tussionex. Stop benzonatate.  Will get cbc an cxr.   You have extensive allergy and side effect history to cephalasporin, sulfa, quinolones, doxycycline and azithromycin. Possible to amoxicillin as well as this is standard treatment for lyme as you report having been treated for.  Will follow you stat chest xray and cbc results and decide if you need antibiotic and what type?  Rest hydrate and tylenol for fever.  Follow up in 7 days or as needed  Note pt at the end of exam stated she had reaction to rocephin 10 yrs ago. She states has used amoxicillin various times since then  with no reaction. She expresses strong confidence that she can take. Pt wbc was not elevated and her cxr did not show pneumonia. She may have some persisting secondar sinus infection and bronchitis post flu like syndrome. So will rx amoxicillin.  Jenna Arroyo, Jenna DredgeEdward, PA-C

## 2016-07-22 LAB — CULTURE, GROUP A STREP: Organism ID, Bacteria: NORMAL

## 2016-07-24 ENCOUNTER — Other Ambulatory Visit: Payer: Self-pay | Admitting: *Deleted

## 2016-07-24 ENCOUNTER — Telehealth: Payer: Self-pay | Admitting: Family Medicine

## 2016-07-24 MED ORDER — AMOXICILLIN 875 MG PO TABS
875.0000 mg | ORAL_TABLET | Freq: Two times a day (BID) | ORAL | 0 refills | Status: DC
Start: 2016-07-24 — End: 2016-12-11

## 2016-07-24 NOTE — Telephone Encounter (Signed)
LMOM with contact name and number [for return call, if needed] RE: results and further provider instructions; Rx to pharmacy with note to pharmacist/SLs 01/19

## 2016-07-24 NOTE — Progress Notes (Signed)
Rx to pharmacy, see Result note/SLS 01/19

## 2016-07-24 NOTE — Telephone Encounter (Signed)
Patient was seen on 07/21/16 by Ramon DredgeEdward and is calling to request her lab and imaging results from that visit. Please advise.   Phone: [phone]

## 2016-08-06 DIAGNOSIS — E559 Vitamin D deficiency, unspecified: Secondary | ICD-10-CM | POA: Diagnosis not present

## 2016-08-06 DIAGNOSIS — R5383 Other fatigue: Secondary | ICD-10-CM | POA: Diagnosis not present

## 2016-08-06 DIAGNOSIS — I10 Essential (primary) hypertension: Secondary | ICD-10-CM | POA: Diagnosis not present

## 2016-08-06 DIAGNOSIS — E039 Hypothyroidism, unspecified: Secondary | ICD-10-CM | POA: Diagnosis not present

## 2016-08-06 DIAGNOSIS — F418 Other specified anxiety disorders: Secondary | ICD-10-CM | POA: Diagnosis not present

## 2016-08-06 DIAGNOSIS — M2550 Pain in unspecified joint: Secondary | ICD-10-CM | POA: Diagnosis not present

## 2016-08-17 DIAGNOSIS — Z789 Other specified health status: Secondary | ICD-10-CM | POA: Diagnosis not present

## 2016-08-17 DIAGNOSIS — E785 Hyperlipidemia, unspecified: Secondary | ICD-10-CM | POA: Diagnosis not present

## 2016-08-24 DIAGNOSIS — I2510 Atherosclerotic heart disease of native coronary artery without angina pectoris: Secondary | ICD-10-CM | POA: Diagnosis not present

## 2016-09-08 DIAGNOSIS — J020 Streptococcal pharyngitis: Secondary | ICD-10-CM | POA: Diagnosis not present

## 2016-09-18 DIAGNOSIS — J029 Acute pharyngitis, unspecified: Secondary | ICD-10-CM | POA: Diagnosis not present

## 2016-09-24 DIAGNOSIS — F411 Generalized anxiety disorder: Secondary | ICD-10-CM | POA: Diagnosis not present

## 2016-10-08 DIAGNOSIS — M2550 Pain in unspecified joint: Secondary | ICD-10-CM | POA: Diagnosis not present

## 2016-10-08 DIAGNOSIS — R5383 Other fatigue: Secondary | ICD-10-CM | POA: Diagnosis not present

## 2016-10-08 DIAGNOSIS — E063 Autoimmune thyroiditis: Secondary | ICD-10-CM | POA: Diagnosis not present

## 2016-10-08 DIAGNOSIS — E785 Hyperlipidemia, unspecified: Secondary | ICD-10-CM | POA: Diagnosis not present

## 2016-10-08 DIAGNOSIS — E039 Hypothyroidism, unspecified: Secondary | ICD-10-CM | POA: Diagnosis not present

## 2016-11-19 DIAGNOSIS — M2550 Pain in unspecified joint: Secondary | ICD-10-CM | POA: Diagnosis not present

## 2016-11-19 DIAGNOSIS — E039 Hypothyroidism, unspecified: Secondary | ICD-10-CM | POA: Diagnosis not present

## 2016-11-19 DIAGNOSIS — R5383 Other fatigue: Secondary | ICD-10-CM | POA: Diagnosis not present

## 2016-11-19 DIAGNOSIS — I10 Essential (primary) hypertension: Secondary | ICD-10-CM | POA: Diagnosis not present

## 2016-12-06 DIAGNOSIS — J029 Acute pharyngitis, unspecified: Secondary | ICD-10-CM | POA: Diagnosis not present

## 2016-12-09 DIAGNOSIS — R5383 Other fatigue: Secondary | ICD-10-CM | POA: Diagnosis not present

## 2016-12-09 DIAGNOSIS — Z885 Allergy status to narcotic agent status: Secondary | ICD-10-CM | POA: Diagnosis not present

## 2016-12-09 DIAGNOSIS — F419 Anxiety disorder, unspecified: Secondary | ICD-10-CM | POA: Diagnosis not present

## 2016-12-09 DIAGNOSIS — Z87891 Personal history of nicotine dependence: Secondary | ICD-10-CM | POA: Diagnosis not present

## 2016-12-09 DIAGNOSIS — F329 Major depressive disorder, single episode, unspecified: Secondary | ICD-10-CM | POA: Diagnosis not present

## 2016-12-09 DIAGNOSIS — Z881 Allergy status to other antibiotic agents status: Secondary | ICD-10-CM | POA: Diagnosis not present

## 2016-12-09 DIAGNOSIS — Z91040 Latex allergy status: Secondary | ICD-10-CM | POA: Diagnosis not present

## 2016-12-09 DIAGNOSIS — Z888 Allergy status to other drugs, medicaments and biological substances status: Secondary | ICD-10-CM | POA: Diagnosis not present

## 2016-12-09 DIAGNOSIS — M791 Myalgia: Secondary | ICD-10-CM | POA: Diagnosis not present

## 2016-12-09 DIAGNOSIS — A6920 Lyme disease, unspecified: Secondary | ICD-10-CM | POA: Diagnosis not present

## 2016-12-09 DIAGNOSIS — Z91048 Other nonmedicinal substance allergy status: Secondary | ICD-10-CM | POA: Diagnosis not present

## 2016-12-09 DIAGNOSIS — Z79899 Other long term (current) drug therapy: Secondary | ICD-10-CM | POA: Diagnosis not present

## 2016-12-09 DIAGNOSIS — I10 Essential (primary) hypertension: Secondary | ICD-10-CM | POA: Diagnosis not present

## 2016-12-11 ENCOUNTER — Ambulatory Visit (INDEPENDENT_AMBULATORY_CARE_PROVIDER_SITE_OTHER): Payer: BLUE CROSS/BLUE SHIELD | Admitting: Family Medicine

## 2016-12-11 ENCOUNTER — Encounter: Payer: Self-pay | Admitting: Family Medicine

## 2016-12-11 VITALS — BP 122/80 | HR 79 | Temp 98.3°F | Resp 16 | Ht 66.0 in | Wt 221.2 lb

## 2016-12-11 DIAGNOSIS — J029 Acute pharyngitis, unspecified: Secondary | ICD-10-CM | POA: Insufficient documentation

## 2016-12-11 DIAGNOSIS — R519 Headache, unspecified: Secondary | ICD-10-CM

## 2016-12-11 DIAGNOSIS — A6920 Lyme disease, unspecified: Secondary | ICD-10-CM

## 2016-12-11 DIAGNOSIS — R590 Localized enlarged lymph nodes: Secondary | ICD-10-CM | POA: Diagnosis not present

## 2016-12-11 DIAGNOSIS — R51 Headache: Secondary | ICD-10-CM | POA: Diagnosis not present

## 2016-12-11 LAB — CBC WITH DIFFERENTIAL/PLATELET
Basophils Absolute: 0 cells/uL (ref 0–200)
Basophils Relative: 0 %
EOS ABS: 392 {cells}/uL (ref 15–500)
Eosinophils Relative: 4 %
HCT: 43.9 % (ref 35.0–45.0)
HEMOGLOBIN: 14.6 g/dL (ref 11.7–15.5)
LYMPHS ABS: 2156 {cells}/uL (ref 850–3900)
Lymphocytes Relative: 22 %
MCH: 30.2 pg (ref 27.0–33.0)
MCHC: 33.3 g/dL (ref 32.0–36.0)
MCV: 90.7 fL (ref 80.0–100.0)
MONO ABS: 588 {cells}/uL (ref 200–950)
MPV: 8.8 fL (ref 7.5–12.5)
Monocytes Relative: 6 %
NEUTROS ABS: 6664 {cells}/uL (ref 1500–7800)
Neutrophils Relative %: 68 %
Platelets: 326 10*3/uL (ref 140–400)
RBC: 4.84 MIL/uL (ref 3.80–5.10)
RDW: 13.6 % (ref 11.0–15.0)
WBC: 9.8 10*3/uL (ref 3.8–10.8)

## 2016-12-11 MED ORDER — KETOROLAC TROMETHAMINE 60 MG/2ML IM SOLN
60.0000 mg | Freq: Once | INTRAMUSCULAR | Status: AC
  Administered 2016-12-11: 60 mg via INTRAMUSCULAR

## 2016-12-11 NOTE — Patient Instructions (Signed)

## 2016-12-11 NOTE — Assessment & Plan Note (Signed)
Finish abx

## 2016-12-11 NOTE — Progress Notes (Signed)
Patient ID: BILLY ROCCO, female   DOB: 1966-01-07, 51 y.o.   MRN: [id number]     Subjective:  I acted as a Education administrator for Dr. Carollee Herter.  Guerry Bruin, [place]   Patient ID: SHARMEL BALLANTINE, female    DOB: 20-Apr-1966, 51 y.o.   MRN: [id number]  Chief Complaint  Patient presents with  . Migraine  . lymph nodes under both arms    Migraine   This is a new problem. Episode onset: 6 days ago. Associated symptoms include nausea. Pertinent negatives include no back pain, blurred vision, coughing, fever or vomiting.   pt went to urgent care about 5 days ago with sore throat and was given amoxicillin She went to ER 2 days ago because of the headache and nausea.  Er gave her reglan and benadryl.  Headache improved so they sent her home with instructions to f/u here  Patient is in today for migraine.  She also has had lymph nodules under both arms for about a couple of week.  They do not hurt and just swollen.  Patient Care Team: Copland, Gay Filler, MD as PCP - General (Family Medicine)   Past Medical History:  Diagnosis Date  . Thyroid disease     No past surgical history on file.  Family History  Problem Relation Age of Onset  . Hypertension Mother   . Heart disease Father   . Hypertension Sister   . Hypertension Brother     Social History   Social History  . Marital status: Divorced    Spouse name: N/A  . Number of children: N/A  . Years of education: N/A   Occupational History  . Not on file.   Social History Main Topics  . Smoking status: Never Smoker  . Smokeless tobacco: Never Used  . Alcohol use No  . Drug use: No  . Sexual activity: No   Other Topics Concern  . Not on file   Social History Narrative  . No narrative on file    Outpatient Medications Prior to Visit  Medication Sig Dispense Refill  . ALPRAZolam (XANAX) 0.5 MG tablet Take 0.5 mg by mouth 3 (three) times daily as needed for anxiety.    Marland Kitchen atenolol (TENORMIN) 25 MG tablet Take 25 mg by mouth 2 (two) times  daily.    . DULoxetine (CYMBALTA) 60 MG capsule Take 60 mg by mouth daily.    . irbesartan (AVAPRO) 150 MG tablet Take 0.5 tablets by mouth daily. Take half tablet daily  6  . amoxicillin (AMOXIL) 875 MG tablet Take 1 tablet (875 mg total) by mouth 2 (two) times daily. 20 tablet 0  . chlorpheniramine-HYDROcodone (TUSSIONEX PENNKINETIC ER) 10-8 MG/5ML SUER Take 5 mLs by mouth every 12 (twelve) hours as needed for cough. 140 mL 0  . promethazine (PHENERGAN) 25 MG tablet Take 1 tablet (25 mg total) by mouth every 6 (six) hours as needed for nausea or vomiting. 15 tablet 0  . thyroid (ARMOUR) 32.5 MG tablet Take 32.5 mg by mouth daily.     No facility-administered medications prior to visit.     Allergies  Allergen Reactions  . Ceftriaxone Sodium In Dextrose Other (See Comments)    "heart races and cannot breathe"  . Guaifenesin Other (See Comments)    "heart races, dizziness, light-headedness"  . Guaifenesin & Derivatives Other (See Comments)    "heart races, dizziness, light-headedness"  . Meperidine Other (See Comments)    "heart races"  . Other Rash  .  Pseudoephedrine Other (See Comments)    "heart races and high blood pressure"  . Rosuvastatin Calcium Nausea And Vomiting    Muscle aches   . Simvastatin Other (See Comments)    muscle aching   . Ciprofloxacin   . Rocephin [Ceftriaxone] Itching    Review of Systems  Constitutional: Negative for fever and malaise/fatigue.       Swollen lymph nodes under both arm pits.  HENT: Negative for congestion.   Eyes: Negative for blurred vision.  Respiratory: Negative for cough and shortness of breath.   Cardiovascular: Negative for chest pain, palpitations and leg swelling.  Gastrointestinal: Positive for nausea. Negative for vomiting.  Musculoskeletal: Negative for back pain.  Skin: Negative for rash.  Neurological: Negative for loss of consciousness and headaches.       Objective:    Physical Exam  Constitutional: She is  oriented to person, place, and time. She appears well-developed and well-nourished.  HENT:  Head: Normocephalic and atraumatic.  Eyes: Conjunctivae and EOM are normal.  Neck: Normal range of motion. Neck supple. No JVD present. Carotid bruit is not present. No thyromegaly present.  Cardiovascular: Normal rate, regular rhythm and normal heart sounds.   No murmur heard. Pulmonary/Chest: Effort normal and breath sounds normal. No respiratory distress. She has no wheezes. She has no rales. She exhibits no tenderness.  Abdominal: Soft. She exhibits no distension. There is no tenderness. There is no rebound and no guarding.  Musculoskeletal: She exhibits tenderness. She exhibits no edema.       Back:  Neurological: She is alert and oriented to person, place, and time.  Psychiatric: She has a normal mood and affect.  Nursing note and vitals reviewed.   BP 122/80 (BP Location: Left Arm, Cuff Size: Normal)   Pulse 79   Temp 98.3 F (36.8 C) (Oral)   Resp 16   Ht '5\' 6"'  (1.676 m)   Wt 221 lb 3.2 oz (100.3 kg)   SpO2 98%   BMI 35.70 kg/m  Wt Readings from Last 3 Encounters:  12/11/16 221 lb 3.2 oz (100.3 kg)  07/21/16 226 lb 8 oz (102.7 kg)  07/17/16 230 lb 12.8 oz (104.7 kg)   BP Readings from Last 3 Encounters:  12/11/16 122/80  07/21/16 126/67  07/17/16 (!) 128/56      There is no immunization history on file for this patient.  Health Maintenance  Topic Date Due  . HIV Screening  08/12/1980  . TETANUS/TDAP  08/12/1984  . PAP SMEAR  08/12/1986  . MAMMOGRAM  08/13/2015  . COLONOSCOPY  08/13/2015  . INFLUENZA VACCINE  02/03/2017    Lab Results  Component Value Date   WBC 9.8 12/11/2016   HGB 14.6 12/11/2016   HCT 43.9 12/11/2016   PLT 326 12/11/2016   GLUCOSE 103 (H) 08/25/2014   NA 137 08/25/2014   K 5.2 (H) 08/25/2014   CL 102 08/25/2014   CREATININE 0.90 08/25/2014   BUN 21 08/25/2014   CO2 31 08/25/2014   TSH 4.116 08/25/2014    Lab Results  Component Value  Date   TSH 4.116 08/25/2014   Lab Results  Component Value Date   WBC 9.8 12/11/2016   HGB 14.6 12/11/2016   HCT 43.9 12/11/2016   MCV 90.7 12/11/2016   PLT 326 12/11/2016   Lab Results  Component Value Date   NA 137 08/25/2014   K 5.2 (H) 08/25/2014   CO2 31 08/25/2014   GLUCOSE 103 (H) 08/25/2014   BUN  21 08/25/2014   CREATININE 0.90 08/25/2014   CALCIUM 10.0 08/25/2014   ANIONGAP 4 (L) 08/25/2014   No results found for: CHOL No results found for: HDL No results found for: LDLCALC No results found for: TRIG No results found for: CHOLHDL No results found for: HGBA1C       Assessment & Plan:   Problem List Items Addressed This Visit      Unprioritized   Axillary lymphadenopathy    Check labs Finish abx rto if they do not resolve      Lyme disease (Chronic)    Per [facility] Pt will back off herbal meds and inc slowly Probable cause of nausea and hot flashes etc      Sore throat    Finish abx        Other Visit Diagnoses    Nonintractable headache, unspecified chronicity pattern, unspecified headache type    -  Primary   Relevant Medications   ketorolac (TORADOL) injection 60 mg (Completed)   Other Relevant Orders   CBC w/Diff (Completed)   Comp Met (CMET) (Completed)   Sed Rate (ESR) (Completed)      I have discontinued Ms. Commons's thyroid, promethazine, chlorpheniramine-HYDROcodone, and amoxicillin. I am also having her maintain her atenolol, ALPRAZolam, DULoxetine, irbesartan, and ARMOUR THYROID. We administered ketorolac.  Meds ordered this encounter  Medications  . ARMOUR THYROID 15 MG tablet    Sig: Take 2 tablets by mouth daily.    Refill:  2  . ketorolac (TORADOL) injection 60 mg    CMA served as scribe during this visit. History, Physical and Plan performed by medical provider. Documentation and orders reviewed and attested to.  Ann Held, DO

## 2016-12-11 NOTE — Assessment & Plan Note (Signed)
[facility] Pt will back off herbal meds and inc slowly Probable cause of nausea and hot flashes etc

## 2016-12-11 NOTE — Assessment & Plan Note (Signed)
Check labs Finish abx rto if they do not resolve

## 2016-12-12 LAB — COMPREHENSIVE METABOLIC PANEL
ALBUMIN: 4.2 g/dL (ref 3.6–5.1)
ALK PHOS: 101 U/L (ref 33–130)
ALT: 43 U/L — ABNORMAL HIGH (ref 6–29)
AST: 24 U/L (ref 10–35)
BILIRUBIN TOTAL: 0.4 mg/dL (ref 0.2–1.2)
BUN: 21 mg/dL (ref 7–25)
CALCIUM: 10 mg/dL (ref 8.6–10.4)
CO2: 26 mmol/L (ref 20–31)
Chloride: 99 mmol/L (ref 98–110)
Creat: 0.9 mg/dL (ref 0.50–1.05)
GLUCOSE: 82 mg/dL (ref 65–99)
POTASSIUM: 4.8 mmol/L (ref 3.5–5.3)
Sodium: 140 mmol/L (ref 135–146)
TOTAL PROTEIN: 6.9 g/dL (ref 6.1–8.1)

## 2016-12-12 LAB — SEDIMENTATION RATE: SED RATE: 14 mm/h (ref 0–30)

## 2016-12-14 ENCOUNTER — Other Ambulatory Visit: Payer: Self-pay | Admitting: Family Medicine

## 2016-12-14 DIAGNOSIS — R945 Abnormal results of liver function studies: Secondary | ICD-10-CM

## 2016-12-14 DIAGNOSIS — R109 Unspecified abdominal pain: Secondary | ICD-10-CM

## 2016-12-14 DIAGNOSIS — R7989 Other specified abnormal findings of blood chemistry: Secondary | ICD-10-CM

## 2016-12-15 ENCOUNTER — Ambulatory Visit (HOSPITAL_BASED_OUTPATIENT_CLINIC_OR_DEPARTMENT_OTHER)
Admission: RE | Admit: 2016-12-15 | Discharge: 2016-12-15 | Disposition: A | Discharge status: Home / Self Care | Payer: BLUE CROSS/BLUE SHIELD | Source: Ambulatory Visit | Attending: Family Medicine | Admitting: Family Medicine

## 2016-12-15 DIAGNOSIS — K760 Fatty (change of) liver, not elsewhere classified: Secondary | ICD-10-CM | POA: Insufficient documentation

## 2016-12-15 DIAGNOSIS — R109 Unspecified abdominal pain: Secondary | ICD-10-CM

## 2016-12-15 DIAGNOSIS — R7989 Other specified abnormal findings of blood chemistry: Secondary | ICD-10-CM

## 2016-12-15 DIAGNOSIS — R945 Abnormal results of liver function studies: Secondary | ICD-10-CM

## 2016-12-16 ENCOUNTER — Other Ambulatory Visit: Payer: Self-pay | Admitting: *Deleted

## 2016-12-16 DIAGNOSIS — R10811 Right upper quadrant abdominal tenderness: Secondary | ICD-10-CM

## 2016-12-25 DIAGNOSIS — I10 Essential (primary) hypertension: Secondary | ICD-10-CM | POA: Diagnosis not present

## 2016-12-25 DIAGNOSIS — E559 Vitamin D deficiency, unspecified: Secondary | ICD-10-CM | POA: Diagnosis not present

## 2016-12-25 DIAGNOSIS — F419 Anxiety disorder, unspecified: Secondary | ICD-10-CM | POA: Diagnosis not present

## 2016-12-25 DIAGNOSIS — E039 Hypothyroidism, unspecified: Secondary | ICD-10-CM | POA: Diagnosis not present

## 2016-12-25 DIAGNOSIS — E063 Autoimmune thyroiditis: Secondary | ICD-10-CM | POA: Diagnosis not present

## 2017-01-07 DIAGNOSIS — A6920 Lyme disease, unspecified: Secondary | ICD-10-CM | POA: Diagnosis not present

## 2017-01-07 DIAGNOSIS — F419 Anxiety disorder, unspecified: Secondary | ICD-10-CM | POA: Diagnosis not present

## 2017-01-07 DIAGNOSIS — R748 Abnormal levels of other serum enzymes: Secondary | ICD-10-CM | POA: Diagnosis not present

## 2017-01-07 DIAGNOSIS — R5383 Other fatigue: Secondary | ICD-10-CM | POA: Diagnosis not present

## 2017-01-07 DIAGNOSIS — E039 Hypothyroidism, unspecified: Secondary | ICD-10-CM | POA: Diagnosis not present

## 2017-01-07 DIAGNOSIS — E063 Autoimmune thyroiditis: Secondary | ICD-10-CM | POA: Diagnosis not present

## 2017-03-02 ENCOUNTER — Ambulatory Visit (INDEPENDENT_AMBULATORY_CARE_PROVIDER_SITE_OTHER): Payer: BLUE CROSS/BLUE SHIELD | Admitting: Family

## 2017-03-02 ENCOUNTER — Encounter: Payer: Self-pay | Admitting: Family

## 2017-03-02 VITALS — BP 132/78 | HR 83 | Temp 98.0°F | Resp 16 | Ht 66.0 in | Wt 223.0 lb

## 2017-03-02 DIAGNOSIS — R110 Nausea: Secondary | ICD-10-CM

## 2017-03-02 DIAGNOSIS — R1011 Right upper quadrant pain: Secondary | ICD-10-CM

## 2017-03-02 LAB — CBC WITH DIFFERENTIAL/PLATELET
BASOS ABS: 0.1 10*3/uL (ref 0.0–0.1)
BASOS PCT: 1.1 % (ref 0.0–3.0)
EOS ABS: 0.3 10*3/uL (ref 0.0–0.7)
Eosinophils Relative: 4.1 % (ref 0.0–5.0)
HEMATOCRIT: 43 % (ref 36.0–46.0)
HEMOGLOBIN: 14.5 g/dL (ref 12.0–15.0)
LYMPHS PCT: 25.5 % (ref 12.0–46.0)
Lymphs Abs: 1.9 10*3/uL (ref 0.7–4.0)
MCHC: 33.7 g/dL (ref 30.0–36.0)
MCV: 91 fl (ref 78.0–100.0)
MONO ABS: 0.4 10*3/uL (ref 0.1–1.0)
Monocytes Relative: 5.8 % (ref 3.0–12.0)
Neutro Abs: 4.6 10*3/uL (ref 1.4–7.7)
Neutrophils Relative %: 63.5 % (ref 43.0–77.0)
PLATELETS: 342 10*3/uL (ref 150.0–400.0)
RBC: 4.72 Mil/uL (ref 3.87–5.11)
RDW: 13.2 % (ref 11.5–15.5)
WBC: 7.3 10*3/uL (ref 4.0–10.5)

## 2017-03-02 LAB — COMPREHENSIVE METABOLIC PANEL
ALBUMIN: 4.1 g/dL (ref 3.5–5.2)
ALK PHOS: 84 U/L (ref 39–117)
ALT: 29 U/L (ref 0–35)
AST: 19 U/L (ref 0–37)
BUN: 11 mg/dL (ref 6–23)
CO2: 35 mEq/L — ABNORMAL HIGH (ref 19–32)
CREATININE: 0.86 mg/dL (ref 0.40–1.20)
Calcium: 10.3 mg/dL (ref 8.4–10.5)
Chloride: 99 mEq/L (ref 96–112)
GFR: 73.78 mL/min (ref >60.00)
Glucose, Bld: 96 mg/dL (ref 70–99)
Potassium: 4.8 mEq/L (ref 3.5–5.1)
SODIUM: 139 meq/L (ref 135–145)
Total Bilirubin: 0.4 mg/dL (ref 0.2–1.2)
Total Protein: 7.2 g/dL (ref 6.0–8.3)

## 2017-03-02 LAB — LIPASE: LIPASE: 25 U/L (ref 11.0–59.0)

## 2017-03-02 MED ORDER — OMEPRAZOLE 40 MG PO CPDR: 40.0000 mg | DELAYED_RELEASE_CAPSULE | Freq: Every day | ORAL | 3 refills | Status: DC

## 2017-03-02 MED ORDER — ONDANSETRON 4 MG PO TBDP: 4.0000 mg | ORAL_TABLET | Freq: Three times a day (TID) | ORAL | 0 refills | Status: DC | PRN

## 2017-03-02 NOTE — Progress Notes (Signed)
Subjective:    Patient ID: Jenna Arroyo, female    DOB: May 03, 1966, 51 y.o.   MRN: [id number]  HPI  Jenna Arroyo is a 51 yr old female who presents today with chief complaint of nausea. She reports that nausea has been present x 1 week. Reports associated vomiting which began last night. She did have an abdominal ultrasound performed on June 12 of this year which noted questionable dependent gallbladder sludge without gallstones or wall thickening.   Reports sore throat, "lymph nodes swollen" beneath her arms. Reports that her lymph nodes have been swollen since "last visit." That was in early June. Reports that she even stopped using deodorant.  She reports normal temp is 96.3-7. Has had temp of 98 which she fells is high for her. She denies sore throat today, thinks it may have been related to acid.    Never did follow through with surgical referral. Reports that last night she was unable to keep "even water down."  At a piece of bread which helps some with her acid symptoms.  + fatigue.  She reports hx of chronic lyme disease.  Nausea today is "not as bad." Has kept down some coconut milk today and water.    Review of Systems See HPI  Past Medical History:  Diagnosis Date  . Thyroid disease      Social History   Social History  . Marital status: Divorced    Spouse name: N/A  . Number of children: N/A  . Years of education: N/A   Occupational History  . Not on file.   Social History Main Topics  . Smoking status: Never Smoker  . Smokeless tobacco: Never Used  . Alcohol use No  . Drug use: No  . Sexual activity: No   Other Topics Concern  . Not on file   Social History Narrative  . No narrative on file    No past surgical history on file.  Family History  Problem Relation Age of Onset  . Hypertension Mother   . Heart disease Father   . Hypertension Sister   . Hypertension Brother     Allergies  Allergen Reactions  . Ceftriaxone Sodium In Dextrose Other (See  Comments)    "heart races and cannot breathe"  . Guaifenesin Other (See Comments)    "heart races, dizziness, light-headedness"  . Guaifenesin & Derivatives Other (See Comments)    "heart races, dizziness, light-headedness"  . Meperidine Other (See Comments)    "heart races"  . Other Rash  . Pseudoephedrine Other (See Comments)    "heart races and high blood pressure"  . Rosuvastatin Calcium Nausea And Vomiting    Muscle aches   . Simvastatin Other (See Comments)    muscle aching   . Ciprofloxacin   . Rocephin [Ceftriaxone] Itching    Current Outpatient Prescriptions on File Prior to Visit  Medication Sig Dispense Refill  . ALPRAZolam (XANAX) 0.5 MG tablet Take 0.5 mg by mouth 3 (three) times daily as needed for anxiety.    Mack Guise THYROID 15 MG tablet Take 2 tablets by mouth daily.  2  . atenolol (TENORMIN) 25 MG tablet Take 25 mg by mouth 2 (two) times daily.    . DULoxetine (CYMBALTA) 60 MG capsule Take 60 mg by mouth daily.    . irbesartan (AVAPRO) 150 MG tablet Take 0.5 tablets by mouth daily. Take half tablet daily  6   No current facility-administered medications on file prior to visit.  BP 132/78 (BP Location: Left Arm, Cuff Size: Large)   Pulse 83   Temp 98 F (36.7 C) (Oral)   Resp 16   Ht 5\' 6"  (1.676 m)   Wt 223 lb (101.2 kg)   SpO2 98%   BMI 35.99 kg/m       Objective:   Physical Exam  Constitutional: She appears well-developed and well-nourished.  Cardiovascular: Normal rate, regular rhythm and normal heart sounds.   No murmur heard. Pulmonary/Chest: Effort normal and breath sounds normal. No respiratory distress. She has no wheezes.  Abdominal: Soft. Bowel sounds are normal. There is no tenderness.  + Murphy's sign  Lymphadenopathy:    She has no cervical adenopathy.  Skin: Skin is warm and dry.  Psychiatric: She has a normal mood and affect. Her behavior is normal. Judgment and thought content normal.  Lymph: no axillary lymphadenopathy.          Assessment & Plan:  Nausea/Abdominal Pain- CBC, lipase, LFT unremarkable. Abd [place] negative.  Trial of PPI and prn zofrain, obtain HIDA scan. Await H pylori breath test results.  If Hida scan negative and symptoms do not improve plan referral to GI.  If HIDA scan abnormal plan referral to gen surgery.

## 2017-03-02 NOTE — Patient Instructions (Addendum)
Please complete lab work prior to leaving. You may use zofran as needed for nausea. Please begin omeprazole (antacid medicine). Please schedule your ultrasound on the first floor in imaging. You will be contacted about your referral to the general surgeon. Please let us know if you have not been contacted in 1 week about this appointment.  Go to the ER if severe/worsening abdominal pain or if you are unable to keep down food/liquid.

## 2017-03-03 ENCOUNTER — Telehealth: Payer: Self-pay | Admitting: Family

## 2017-03-03 DIAGNOSIS — R1011 Right upper quadrant pain: Secondary | ICD-10-CM

## 2017-03-03 NOTE — Telephone Encounter (Signed)
Kidney function, electrolytes, and blood count looked good. I see that she did not complete the H. pylori test. If she has already started the omeprazole we cannot do the breath test. If she has not yet started omeprazole please ask her to return to the lab to complete the H. pylori breath test. If she has started omeprazole then I would like to change the order to an H. pylori IgG please.

## 2017-03-03 NOTE — Telephone Encounter (Signed)
Left message for pt to return my call.

## 2017-03-04 ENCOUNTER — Ambulatory Visit (HOSPITAL_BASED_OUTPATIENT_CLINIC_OR_DEPARTMENT_OTHER)
Admission: RE | Admit: 2017-03-04 | Discharge: 2017-03-04 | Disposition: A | Discharge status: Home / Self Care | Payer: BLUE CROSS/BLUE SHIELD | Source: Ambulatory Visit | Attending: Family | Admitting: Family

## 2017-03-04 ENCOUNTER — Other Ambulatory Visit: Payer: BLUE CROSS/BLUE SHIELD

## 2017-03-04 DIAGNOSIS — R1011 Right upper quadrant pain: Secondary | ICD-10-CM | POA: Diagnosis not present

## 2017-03-04 NOTE — Telephone Encounter (Signed)
Pt called back to follow up.  Lab results discussed.  She stated understanding.  Per patient she completed her h pylori test today.  She had not started her omeprazole. Therefore, orders were kept the same.  Please advise when patient should start omeprazole.

## 2017-03-04 NOTE — Telephone Encounter (Signed)
abd us normal.  No gallstones.  I am not convinced that her pain is related to her gallbladder.  I would like to have her complete a HIDA scan (evaluates gallbladder function). After reviewing HIDA and her H Pylori results, we will decide if GI or Surgery referral is most appropriate.

## 2017-03-04 NOTE — Telephone Encounter (Signed)
Pt called in returning call for lab results.  °

## 2017-03-05 ENCOUNTER — Encounter: Payer: Self-pay | Admitting: Family

## 2017-03-05 LAB — H. PYLORI BREATH TEST: H. pylori Breath Test: NOT DETECTED

## 2017-03-05 NOTE — Telephone Encounter (Signed)
Notified pt. She is concerned about the toxicity of the contrast media used for the HIDA scan?  Please advise?

## 2017-03-05 NOTE — Telephone Encounter (Signed)
Left detailed message on voicemail and to call and let us know how she wants to proceed. 

## 2017-03-05 NOTE — Telephone Encounter (Signed)
Typically we only worry about risk of contrast media in patients with kidney impairment. Her kidney function is normal.

## 2017-03-05 NOTE — Telephone Encounter (Signed)
Pt returned call. She said to go ahead and proceed.

## 2017-03-09 NOTE — Telephone Encounter (Signed)
Test was completed

## 2017-03-09 NOTE — Telephone Encounter (Signed)
Order signed.

## 2017-03-11 DIAGNOSIS — F411 Generalized anxiety disorder: Secondary | ICD-10-CM | POA: Diagnosis not present

## 2017-04-08 DIAGNOSIS — E039 Hypothyroidism, unspecified: Secondary | ICD-10-CM | POA: Diagnosis not present

## 2017-04-08 DIAGNOSIS — M2550 Pain in unspecified joint: Secondary | ICD-10-CM | POA: Diagnosis not present

## 2017-04-08 DIAGNOSIS — E559 Vitamin D deficiency, unspecified: Secondary | ICD-10-CM | POA: Diagnosis not present

## 2017-04-08 DIAGNOSIS — I10 Essential (primary) hypertension: Secondary | ICD-10-CM | POA: Diagnosis not present

## 2017-04-08 DIAGNOSIS — F419 Anxiety disorder, unspecified: Secondary | ICD-10-CM | POA: Diagnosis not present

## 2017-04-08 DIAGNOSIS — E063 Autoimmune thyroiditis: Secondary | ICD-10-CM | POA: Diagnosis not present

## 2017-04-15 DIAGNOSIS — E039 Hypothyroidism, unspecified: Secondary | ICD-10-CM | POA: Diagnosis not present

## 2017-04-15 DIAGNOSIS — R5383 Other fatigue: Secondary | ICD-10-CM | POA: Diagnosis not present

## 2017-04-15 DIAGNOSIS — A6920 Lyme disease, unspecified: Secondary | ICD-10-CM | POA: Diagnosis not present

## 2017-04-15 DIAGNOSIS — M2550 Pain in unspecified joint: Secondary | ICD-10-CM | POA: Diagnosis not present

## 2017-04-16 ENCOUNTER — Encounter: Payer: Self-pay | Admitting: Medical

## 2017-04-16 ENCOUNTER — Ambulatory Visit (INDEPENDENT_AMBULATORY_CARE_PROVIDER_SITE_OTHER): Payer: BLUE CROSS/BLUE SHIELD | Admitting: Medical

## 2017-04-16 VITALS — BP 120/80 | HR 86 | Temp 98.1°F | Resp 16 | Ht 66.0 in | Wt 228.6 lb

## 2017-04-16 DIAGNOSIS — R059 Cough, unspecified: Secondary | ICD-10-CM

## 2017-04-16 DIAGNOSIS — R05 Cough: Secondary | ICD-10-CM

## 2017-04-16 DIAGNOSIS — J3089 Other allergic rhinitis: Secondary | ICD-10-CM

## 2017-04-16 DIAGNOSIS — K219 Gastro-esophageal reflux disease without esophagitis: Secondary | ICD-10-CM | POA: Diagnosis not present

## 2017-04-16 MED ORDER — FLUTICASONE PROPIONATE 50 MCG/ACT NA SUSP: 2.0000 | Freq: Every day | NASAL | 1 refills | Status: DC

## 2017-04-16 MED ORDER — LEVOCETIRIZINE DIHYDROCHLORIDE 5 MG PO TABS: 5.0000 mg | ORAL_TABLET | Freq: Every evening | ORAL | 0 refills | Status: DC

## 2017-04-16 NOTE — Progress Notes (Signed)
Subjective:    Patient ID: Jenna Arroyo, female    DOB: 05-28-1966, 51 y.o.   MRN: [id number]  HPI  Pt in states in past gets rare bronchitis.  Pt states sometimes she has coughing episodes recently for a year and half. Worse in past but cough still intermittent and recently mild worse..  She states even if not obviously sick will get cough. Cough occurs randomly. No associated wheezing. Pt does get occasional heart burn sensation at times in past. Sometimes after she eats she does cough immediately.(no ace inhibitors used)   Pt has been sneezing some recently with recent increase in cough over past year. States when temp was hotter sneezed more. She can feel some pnd.   Pt does not smoke.  Faint mild st.(rapid strep test is negative today). Faint mild achy all over but chronic lyme disease. She sees integrative medicine for this.   Review of Systems  Constitutional: Negative for chills, fatigue and fever.  HENT: Positive for congestion, postnasal drip, sneezing and sore throat. Negative for ear pain, nosebleeds, rhinorrhea, sinus pressure and trouble swallowing.   Respiratory: Negative for cough, chest tightness, shortness of breath and wheezing.        Cough intermittent and dry. Not productive.  Cardiovascular: Negative for chest pain and palpitations.  Gastrointestinal: Negative for abdominal distention, abdominal pain, constipation, diarrhea, nausea and vomiting.  Musculoskeletal: Negative for back pain.  Skin: Negative for color change and rash.  Neurological: Negative for dizziness, seizures, weakness, numbness and headaches.  Hematological: Negative for adenopathy. Does not bruise/bleed easily.  Psychiatric/Behavioral: Negative for behavioral problems and confusion.    Past Medical History:  Diagnosis Date  . Thyroid disease      Social History   Social History  . Marital status: Divorced    Spouse name: N/A  . Number of children: N/A  . Years of education: N/A     Occupational History  . Not on file.   Social History Main Topics  . Smoking status: Never Smoker  . Smokeless tobacco: Never Used  . Alcohol use No  . Drug use: No  . Sexual activity: No   Other Topics Concern  . Not on file   Social History Narrative  . No narrative on file    No past surgical history on file.  Family History  Problem Relation Age of Onset  . Hypertension Mother   . Heart disease Father   . Hypertension Sister   . Hypertension Brother     Allergies  Allergen Reactions  . Ceftriaxone Sodium In Dextrose Other (See Comments)    "heart races and cannot breathe"  . Guaifenesin Other (See Comments)    "heart races, dizziness, light-headedness"  . Guaifenesin & Derivatives Other (See Comments)    "heart races, dizziness, light-headedness"  . Meperidine Other (See Comments)    "heart races"  . Other Rash  . Pseudoephedrine Other (See Comments)    "heart races and high blood pressure"  . Rosuvastatin Calcium Nausea And Vomiting    Muscle aches   . Simvastatin Other (See Comments)    muscle aching   . Ciprofloxacin   . Rocephin [Ceftriaxone] Itching    Current Outpatient Prescriptions on File Prior to Visit  Medication Sig Dispense Refill  . ALPRAZolam (XANAX) 0.5 MG tablet Take 0.5 mg by mouth 3 (three) times daily as needed for anxiety.    Mack Guise THYROID 15 MG tablet Take 2 tablets by mouth daily.  2  .  atenolol (TENORMIN) 25 MG tablet Take 25 mg by mouth 2 (two) times daily.    . DULoxetine (CYMBALTA) 60 MG capsule Take 60 mg by mouth daily.    . irbesartan (AVAPRO) 150 MG tablet Take 0.5 tablets by mouth daily. Take half tablet daily  6  . NON FORMULARY CBD OIL.  Take 1 capsule by mouth daily.    Marland Kitchen omeprazole (PRILOSEC) 40 MG capsule Take 1 capsule (40 mg total) by mouth daily. 30 capsule 3  . ondansetron (ZOFRAN ODT) 4 MG disintegrating tablet Take 1 tablet (4 mg total) by mouth every 8 (eight) hours as needed for nausea or vomiting. 20  tablet 0  . PREBIOTIC PRODUCT PO Take by mouth. Mix in shake and drink once a day.    . Probiotic Product (PROBIOTIC DAILY PO) Take 1 capsule by mouth daily.     No current facility-administered medications on file prior to visit.     BP (!) 115/97   Pulse 86   Temp 98.1 F (36.7 C) (Oral)   Resp 16   Ht  (1.676 m)   Wt 228 lb 9.6 oz (103.7 kg)   SpO2 100%   BMI 36.90 kg/m      Objective:   Physical Exam  General  Mental Status - Alert. General Appearance - Well groomed. Not in acute distress.  Skin Rashes- No Rashes.  HEENT Head- Normal. Ear Auditory Canal - Left- Normal. Right - Normal.Tympanic Membrane- Left- Normal. Right- Normal. Eye Sclera/Conjunctiva- Left- Normal. Right- Normal. Nose & Sinuses Nasal Mucosa- Left-  Boggy and Congested. Right-  Boggy and  Congested.Bilateral no maxillary and  No frontal sinus pressure. Mouth & Throat Lips: Upper Lip- Normal: no dryness, cracking, pallor, cyanosis, or vesicular eruption. Lower Lip-Normal: no dryness, cracking, pallor, cyanosis or vesicular eruption. Buccal Mucosa- Bilateral- No Aphthous ulcers. Oropharynx- No Discharge or Erythema. +pnd. Tonsils: Characteristics- Bilateral- No Erythema or Congestion. Size/Enlargement- Bilateral- No enlargement. Discharge- bilateral-None.  Neck Neck- Supple. No Masses.   Chest and Lung Exam Auscultation: Breath Sounds:-Clear even and unlabored.  Cardiovascular Auscultation:Rythm- Regular, rate and rhythm. Murmurs & Other Heart Sounds:Ausculatation of the heart reveal- No Murmurs.  Lymphatic Head & Neck General Head & Neck Lymphatics: Bilateral: Description- No Localized lymphadenopathy.   Neurologic Cranial Nerve exam:- CN III-XII intact(No nystagmus), symmetric smile. Strength:- 5/5 equal and symmetric strength both upper and lower extremities.       Assessment & Plan:  On review and discussion, I think your cough is probably allergy related. I will  prescribe Xyzal antihistamine and Flonase nasal spray. I want you to know that over the next 2 weeks if you think your cough has improved.  Also you had mentioned sometimes occasional cough after eating. Cough can also be associated with reflux. I do want you to eat healthy diet but if you note cough after eating then you can add on Zantac/ranitidine. This is over-the-counter.  Chest x-ray in the past was negative. If you develop productive cough and chest congestion recommend getting chest x-ray again.  If you note any cough associated with wheezing then would prescribe inhaler.  Your rapid strep test was negative. He report minimal faint throat. At times thinks sore throat can be postnasal drainage related. But I am also going to send out throat culture. We'll let you know the results of that culture.  Follow-up in 2-3 weeks or as needed.  I did not think any indication for antibiotic today/  Esperanza Richters, PA-C

## 2017-04-16 NOTE — Patient Instructions (Addendum)
On review and discussion, I think your cough is probably allergy related. I will prescribe Xyzal antihistamine and Flonase nasal spray. I want you to know that over the next 2 weeks if you think your cough has improved.  Also you had mentioned sometimes occasional cough after eating. Cough can also be associated with reflux. I do want you to eat healthy diet but if you note cough after eating then you can add on Zantac/ranitidine. This is over-the-counter.  Chest x-ray in the past was negative. If you develop productive cough and chest congestion recommend getting chest x-ray again.  If you note any cough associated with wheezing then would prescribe inhaler.  Your rapid strep test was negative. He report minimal faint throat. At times thinks sore throat can be postnasal drainage related. But I am also going to send out throat culture. We'll let you know the results of that culture.  Follow-up in 2-3 weeks or as needed.

## 2017-04-18 LAB — CULTURE, GROUP A STREP
MICRO NUMBER: 81140218
SPECIMEN QUALITY: ADEQUATE

## 2017-05-07 DIAGNOSIS — Z79899 Other long term (current) drug therapy: Secondary | ICD-10-CM | POA: Diagnosis not present

## 2017-05-07 DIAGNOSIS — E785 Hyperlipidemia, unspecified: Secondary | ICD-10-CM | POA: Diagnosis not present

## 2017-05-13 DIAGNOSIS — I10 Essential (primary) hypertension: Secondary | ICD-10-CM | POA: Diagnosis not present

## 2017-05-13 DIAGNOSIS — E785 Hyperlipidemia, unspecified: Secondary | ICD-10-CM | POA: Diagnosis not present

## 2017-05-25 NOTE — Progress Notes (Deleted)
[facility] [address], [address] [place], [place] [address] [phone] [phone]  Date:  05/26/2017   Name:  Jenna GessLisa M Arroyo   DOB:  05/17/1966   MRN:  [id number]  PCP:  Pearline Cablesopland, Jessica C, MD    Chief Complaint: No chief complaint on file.   History of Present Illness:  Jenna Arroyo is a 51 y.o. very pleasant female patient who presents with the following:  History of hypothyroidism, HTN.  I last saw her last December Here today with concern of   Her thyroid is managed by [facility]   Patient Active Problem List   Diagnosis Date Noted  . Sore throat 12/11/2016  . Axillary lymphadenopathy 12/11/2016  . Lyme disease 12/11/2016  . Dyslipidemia 06/18/2016  . Essential hypertension, benign 06/18/2016  . Acquired hypothyroidism 06/18/2016    Past Medical History:  Diagnosis Date  . Thyroid disease     No past surgical history on file.  Social History   Tobacco Use  . Smoking status: Never Smoker  . Smokeless tobacco: Never Used  Substance Use Topics  . Alcohol use: No  . Drug use: No    Family History  Problem Relation Age of Onset  . Hypertension Mother   . Heart disease Father   . Hypertension Sister   . Hypertension Brother     Allergies  Allergen Reactions  . Ceftriaxone Sodium In Dextrose Other (See Comments)    "heart races and cannot breathe"  . Guaifenesin Other (See Comments)    "heart races, dizziness, light-headedness"  . Guaifenesin & Derivatives Other (See Comments)    "heart races, dizziness, light-headedness"  . Meperidine Other (See Comments)    "heart races"  . Other Rash  . Pseudoephedrine Other (See Comments)    "heart races and high blood pressure"  . Rosuvastatin Calcium Nausea And Vomiting    Muscle aches   . Simvastatin Other (See Comments)    muscle aching   . Ciprofloxacin   . Rocephin [Ceftriaxone] Itching    Medication list has been reviewed and  updated.  Current Outpatient Medications on File Prior to Visit  Medication Sig Dispense Refill  . ALPRAZolam (XANAX) 0.5 MG tablet Take 0.5 mg by mouth 3 (three) times daily as needed for anxiety.    Mack Guise. ARMOUR THYROID 15 MG tablet Take 2 tablets by mouth daily.  2  . atenolol (TENORMIN) 25 MG tablet Take 25 mg by mouth 2 (two) times daily.    . DULoxetine (CYMBALTA) 60 MG capsule Take 60 mg by mouth daily.    . fluticasone (FLONASE) 50 MCG/ACT nasal spray Place 2 sprays into both nostrils daily. 16 g 1  . irbesartan (AVAPRO) 150 MG tablet Take 0.5 tablets by mouth daily. Take half tablet daily  6  . levocetirizine (XYZAL) 5 MG tablet Take 1 tablet (5 mg total) by mouth every evening. 30 tablet 0  . NON FORMULARY CBD OIL.  Take 1 capsule by mouth daily.    Marland Kitchen. omeprazole (PRILOSEC) 40 MG capsule Take 1 capsule (40 mg total) by mouth daily. 30 capsule 3  . ondansetron (ZOFRAN ODT) 4 MG disintegrating tablet Take 1 tablet (4 mg total) by mouth every 8 (eight) hours as needed for nausea or vomiting. 20 tablet 0  . PREBIOTIC PRODUCT PO Take by mouth. Mix in shake and drink once a day.    . Probiotic Product (PROBIOTIC DAILY PO) Take 1 capsule by mouth  daily.     No current facility-administered medications on file prior to visit.     Review of Systems:  As per HPI- otherwise negative.   Physical Examination: There were no vitals filed for this visit. There were no vitals filed for this visit. There is no height or weight on file to calculate BMI. Ideal Body Weight:    GEN: WDWN, NAD, Non-toxic, A & O x 3 HEENT: Atraumatic, Normocephalic. Neck supple. No masses, No LAD. Ears and Nose: No external deformity. CV: RRR, No M/G/R. No JVD. No thrill. No extra heart sounds. PULM: CTA B, no wheezes, crackles, rhonchi. No retractions. No resp. distress. No accessory muscle use. ABD: S, NT, ND, +BS. No rebound. No HSM. EXTR: No c/c/e NEURO Normal gait.  PSYCH: Normally interactive. Conversant.  Not depressed or anxious appearing.  Calm demeanor.    Assessment and Plan: ***  Signed Abbe AmsterdamJessica Copland, MD

## 2017-05-26 ENCOUNTER — Ambulatory Visit: Payer: Self-pay | Admitting: Family Medicine

## 2017-06-01 ENCOUNTER — Emergency Department (HOSPITAL_BASED_OUTPATIENT_CLINIC_OR_DEPARTMENT_OTHER)
Admission: EM | Admit: 2017-06-01 | Discharge: 2017-06-01 | Disposition: A | Discharge status: Home / Self Care | Payer: BLUE CROSS/BLUE SHIELD | Attending: Emergency Medicine | Admitting: Emergency Medicine

## 2017-06-01 ENCOUNTER — Encounter (HOSPITAL_BASED_OUTPATIENT_CLINIC_OR_DEPARTMENT_OTHER): Payer: Self-pay | Admitting: Emergency Medicine

## 2017-06-01 ENCOUNTER — Other Ambulatory Visit: Payer: Self-pay

## 2017-06-01 DIAGNOSIS — Z79899 Other long term (current) drug therapy: Secondary | ICD-10-CM | POA: Insufficient documentation

## 2017-06-01 DIAGNOSIS — S0990XA Unspecified injury of head, initial encounter: Secondary | ICD-10-CM | POA: Diagnosis present

## 2017-06-01 DIAGNOSIS — Y999 Unspecified external cause status: Secondary | ICD-10-CM | POA: Diagnosis not present

## 2017-06-01 DIAGNOSIS — E039 Hypothyroidism, unspecified: Secondary | ICD-10-CM | POA: Diagnosis not present

## 2017-06-01 DIAGNOSIS — Y9389 Activity, other specified: Secondary | ICD-10-CM | POA: Diagnosis not present

## 2017-06-01 DIAGNOSIS — R51 Headache: Secondary | ICD-10-CM | POA: Diagnosis not present

## 2017-06-01 DIAGNOSIS — S060X0A Concussion without loss of consciousness, initial encounter: Secondary | ICD-10-CM | POA: Diagnosis not present

## 2017-06-01 DIAGNOSIS — W2209XA Striking against other stationary object, initial encounter: Secondary | ICD-10-CM | POA: Insufficient documentation

## 2017-06-01 DIAGNOSIS — Y92512 Supermarket, store or market as the place of occurrence of the external cause: Secondary | ICD-10-CM | POA: Insufficient documentation

## 2017-06-01 DIAGNOSIS — I10 Essential (primary) hypertension: Secondary | ICD-10-CM | POA: Diagnosis not present

## 2017-06-01 NOTE — ED Triage Notes (Signed)
Pt presents with c/o head injury that happened Sunday. No LOC or vomiting.

## 2017-06-01 NOTE — ED Provider Notes (Signed)
[facility] EMERGENCY DEPARTMENT Provider Note   CSN: [id number] Arrival date & time: 06/01/17  2002     History   Chief Complaint Chief Complaint  Patient presents with  . Head Injury    HPI Jenna Arroyo is a 51 y.o. female.  HPI  51 year old female presents with a headache after suffering a head injury 2 days ago.  [facility] and was [facility] down to go under a brick wall above her.  However she stood up too quick and hit the back of her head on this wall.  She states it really jarred her.  However no loss of consciousness.  She states she actually did not feel bad the first 24 hours.  Has developed progressive headaches since last night.  Currently is about a 5 or 6 out of 10.  Has taken some ibuprofen with partial relief.  No vomiting, dizziness, blurry vision, or weakness/numbness.  Some nausea.  She is not taking aspirin or other blood thinners. Some neck/shoulder pain as well.  Past Medical History:  Diagnosis Date  . Thyroid disease     Patient Active Problem List   Diagnosis Date Noted  . Sore throat 12/11/2016  . Axillary lymphadenopathy 12/11/2016  . Lyme disease 12/11/2016  . Dyslipidemia 06/18/2016  . Essential hypertension, benign 06/18/2016  . Acquired hypothyroidism 06/18/2016    History reviewed. No pertinent surgical history.  OB History    No data available       Home Medications    Prior to Admission medications   Medication Sig Start Date End Date Taking? Authorizing Provider  ALPRAZolam Prudy Feeler(XANAX) 0.5 MG tablet Take 0.5 mg by mouth 3 (three) times daily as needed for anxiety.    [provider]  ARMOUR THYROID 15 MG tablet Take 2 tablets by mouth daily. 11/20/16   [provider]  atenolol (TENORMIN) 25 MG tablet Take 25 mg by mouth 2 (two) times daily.    [provider]  DULoxetine (CYMBALTA) 60 MG capsule Take 60 mg by mouth daily.    [provider]  fluticasone (FLONASE) 50 MCG/ACT nasal  spray Place 2 sprays into both nostrils daily. 04/16/17   Saguier, Ramon DredgeEdward, PA-C  irbesartan (AVAPRO) 150 MG tablet Take 0.5 tablets by mouth daily. Take half tablet daily 04/29/16   [provider]  levocetirizine (XYZAL) 5 MG tablet Take 1 tablet (5 mg total) by mouth every evening. 04/16/17   Saguier, Ramon DredgeEdward, PA-C  NON FORMULARY CBD OIL.  Take 1 capsule by mouth daily.    [provider]  omeprazole (PRILOSEC) 40 MG capsule Take 1 capsule (40 mg total) by mouth daily. 03/02/17   Sandford Craze'Sullivan, Melissa, NP  ondansetron (ZOFRAN ODT) 4 MG disintegrating tablet Take 1 tablet (4 mg total) by mouth every 8 (eight) hours as needed for nausea or vomiting. 03/02/17   Sandford Craze'Sullivan, Melissa, NP  PREBIOTIC PRODUCT PO Take by mouth. Mix in shake and drink once a day.    [provider]  Probiotic Product (PROBIOTIC DAILY PO) Take 1 capsule by mouth daily.    [provider]    Family History Family History  Problem Relation Age of Onset  . Hypertension Mother   . Heart disease Father   . Hypertension Sister   . Hypertension Brother     Social History Social History   Tobacco Use  . Smoking status: Never Smoker  . Smokeless tobacco: Never Used  Substance Use Topics  . Alcohol use: No  .  Drug use: No     Allergies   Ceftriaxone sodium in dextrose; Guaifenesin; Guaifenesin & derivatives; Meperidine; Other; Pseudoephedrine; Rosuvastatin calcium; Simvastatin; Ciprofloxacin; and Rocephin [ceftriaxone]   Review of Systems Review of Systems  Constitutional: Negative for fever.  Eyes: Positive for photophobia (mild). Negative for visual disturbance.  Gastrointestinal: Positive for nausea. Negative for vomiting.  Musculoskeletal: Positive for neck pain.  Neurological: Positive for headaches. Negative for dizziness and weakness.  All other systems reviewed and are negative.    Physical Exam Updated Vital Signs BP 126/85 (BP Location: Right Arm)   Pulse 68    Temp 98.8 F (37.1 C) (Oral)   Resp 18   Ht 5\' 6"  (1.676 m)   Wt 99.8 kg (220 lb)   SpO2 100%   BMI 35.51 kg/m   Physical Exam  Constitutional: She is oriented to person, place, and time. She appears well-developed and well-nourished.  HENT:  Head: Normocephalic and atraumatic.    Right Ear: External ear normal.  Left Ear: External ear normal.  Nose: Nose normal.  Eyes: EOM are normal. Pupils are equal, round, and reactive to light. Right eye exhibits no discharge. Left eye exhibits no discharge.  Neck: Normal range of motion. Neck supple. Muscular tenderness (mild) present. No spinous process tenderness present.    Cardiovascular: Normal rate, regular rhythm and normal heart sounds.  Pulmonary/Chest: Effort normal and breath sounds normal.  Abdominal: Soft. There is no tenderness.  Neurological: She is alert and oriented to person, place, and time.  CN 3-12 grossly intact. 5/5 strength in all 4 extremities. Normal finger to nose.   Skin: Skin is warm and dry.  Nursing note and vitals reviewed.    ED Treatments / Results  Labs (all labs ordered are listed, but only abnormal results are displayed) Labs Reviewed - No data to display  EKG  EKG Interpretation None       Radiology No results found.  Procedures Procedures (including critical care time)  Medications Ordered in ED Medications - No data to display   Initial Impression / Assessment and Plan / ED Course  I have reviewed the triage vital signs and the nursing notes.  Pertinent labs & imaging results that were available during my care of the patient were reviewed by me and considered in my medical decision making (see chart for details).     Patient symptoms are consistent with a concussion, mild in severity.  She did not lose consciousness or have a severe head injury just concern for intracranial injury such as bleeding or skull fracture.  Given the delayed onset of her symptoms my suspicion of  serious injury is very low.  I do not think CT scan warranted.  Treat symptomatically with NSAIDs, Tylenol, fluids.  Offered anti-emetics but she declines.  Follow-up with PCP.  Discussed return precautions.  Final Clinical Impressions(s) / ED Diagnoses   Final diagnoses:  Concussion without loss of consciousness, initial encounter    ED Discharge Orders    None       Pricilla LovelessGoldston, Greggory Safranek, MD 06/01/17 2134

## 2017-06-02 ENCOUNTER — Ambulatory Visit: Payer: BLUE CROSS/BLUE SHIELD | Admitting: Internal Medicine

## 2017-06-02 ENCOUNTER — Encounter: Payer: Self-pay | Admitting: Internal Medicine

## 2017-06-02 VITALS — BP 118/78 | HR 80 | Temp 97.5°F | Resp 14 | Ht 66.0 in | Wt 232.1 lb

## 2017-06-02 DIAGNOSIS — R51 Headache: Secondary | ICD-10-CM

## 2017-06-02 DIAGNOSIS — S060X0D Concussion without loss of consciousness, subsequent encounter: Secondary | ICD-10-CM

## 2017-06-02 DIAGNOSIS — R519 Headache, unspecified: Secondary | ICD-10-CM

## 2017-06-02 MED ORDER — CYCLOBENZAPRINE HCL 10 MG PO TABS: 10.0000 mg | ORAL_TABLET | Freq: Every evening | ORAL | 0 refills | Status: DC | PRN

## 2017-06-02 NOTE — Progress Notes (Signed)
Pre visit review using our clinic review tool, if applicable. No additional management support is needed unless otherwise documented below in the visit note. 

## 2017-06-02 NOTE — Progress Notes (Signed)
Subjective:    Patient ID: Jenna GessLisa M Curenton, female    DOB: 02/20/1966, 51 y.o.   MRN: [id number]  DOS:  06/02/2017 Type of visit - description : ED f/u Interval history:  Was seen at the ER 06/01/2017, 2 days after fall and head injury. Excerpts form the ER    51 year old female presents with a headache after suffering a head injury 2 days ago.  [facility] and was bending down to go under a brick wall above her.  However she stood up too quick and hit the back of her head on this wall.  She states it really jarred her.  However no loss of consciousness.  She states she actually did not feel bad the first 24 hours.  Has developed progressive headaches since last night.  Currently is about a 5 or 6 out of 10.  Has taken some ibuprofen with partial relief.  No vomiting, dizziness, blurry vision, or weakness/numbness.  Some nausea.  She is not taking aspirin or other blood thinners. Some neck/shoulder pain as well.  After the ER evaluation was recommended to go home, take ibuprofen. The patient declines to take ibuprofen because the risk of bleeding. She is  here today because she continue with a headache, goes from the  back of the head  down to the neck and upper back bilaterally . Worse with certain movements.  Review of Systems Denies upper or lower extremity paresthesias Had some nausea but she has that symptom from time to time. No vomiting. She did feel slightly lightheaded this morning when she turned in bed but no dizziness per se. Last menstrual period 4 years ago.  Past Medical History:  Diagnosis Date  . Thyroid disease     History reviewed. No pertinent surgical history.  Social History   Socioeconomic History  . Marital status: Divorced    Spouse name: Not on file  . Number of children: Not on file  . Years of education: Not on file  . Highest education level: Not on file  Social Needs  . Financial resource strain: Not on file  . Food insecurity - worry: Not  on file  . Food insecurity - inability: Not on file  . Transportation needs - medical: Not on file  . Transportation needs - non-medical: Not on file  Occupational History  . Not on file  Tobacco Use  . Smoking status: Never Smoker  . Smokeless tobacco: Never Used  Substance and Sexual Activity  . Alcohol use: No  . Drug use: No  . Sexual activity: No  Other Topics Concern  . Not on file  Social History Narrative  . Not on file      Allergies as of 06/02/2017      Reactions   Ceftriaxone Sodium In Dextrose Other (See Comments)   "heart races and cannot breathe"   Guaifenesin Other (See Comments)   "heart races, dizziness, light-headedness"   Guaifenesin & Derivatives Other (See Comments)   "heart races, dizziness, light-headedness"   Meperidine Other (See Comments)   "heart races"   Other Rash   Pseudoephedrine Other (See Comments)   "heart races and high blood pressure"   Rosuvastatin Calcium Nausea And Vomiting   Muscle aches    Simvastatin Other (See Comments)   muscle aching    Ciprofloxacin    Rocephin [ceftriaxone] Itching      Medication List        Accurate as of 06/02/17 11:59 PM. Always use  your most recent med list.          ALPRAZolam 0.5 MG tablet Commonly known as:  XANAX Take 0.5 mg by mouth 3 (three) times daily as needed for anxiety.   ARMOUR THYROID 15 MG tablet Generic drug:  thyroid Take 2 tablets by mouth daily.   atenolol 25 MG tablet Commonly known as:  TENORMIN Take 25 mg by mouth 2 (two) times daily.   CANNABIDIOL PO Take by mouth. As directed   cyclobenzaprine 10 MG tablet Commonly known as:  FLEXERIL Take 1 tablet (10 mg total) by mouth at bedtime as needed for muscle spasms.   DULoxetine 60 MG capsule Commonly known as:  CYMBALTA Take 60 mg by mouth daily.   fluticasone 50 MCG/ACT nasal spray Commonly known as:  FLONASE Place 2 sprays into both nostrils daily.   irbesartan 150 MG tablet Commonly known as:   AVAPRO Take 0.5 tablets by mouth daily. Take half tablet daily   NON FORMULARY CBD OIL.  Take 1 capsule by mouth daily.   omeprazole 40 MG capsule Commonly known as:  PRILOSEC Take 1 capsule (40 mg total) by mouth daily.   ondansetron 4 MG disintegrating tablet Commonly known as:  ZOFRAN ODT Take 1 tablet (4 mg total) by mouth every 8 (eight) hours as needed for nausea or vomiting.   PREBIOTIC PRODUCT PO Take by mouth. Mix in shake and drink once a day.   PROBIOTIC DAILY PO Take 1 capsule by mouth daily.          Objective:   Physical Exam  Neck:     BP 118/78 (BP Location: Left Arm, Patient Position: Sitting, Cuff Size: Normal)   Pulse 80   Temp (!) 97.5 F (36.4 C) (Oral)   Resp 14   Ht 5\' 6"  (1.676 m)   Wt 232 lb 2 oz (105.3 kg)   SpO2 98%   BMI 37.47 kg/m  General:   Well developed, well nourished . NAD.  HEENT:  Normocephalic . Face symmetric, atraumatic Neck: No TTP at the cervical spine, range of motion slightly decreased. + Pain particularly with hyper extension. Lungs:  CTA B Normal respiratory effort, no intercostal retractions, no accessory muscle use. Heart: RRR,  no murmur.  No pretibial edema bilaterally  Skin: Not pale. Not jaundice Neurologic:  alert & oriented X3.  Speech normal, gait appropriate for age and unassisted. Motor and DTRs symmetric Psych--  Cognition and judgment appear intact.  Cooperative with normal attention span and concentration.  Behavior appropriate. No anxious or depressed appearing.      Assessment & Plan:   51 year old female with history of HTN, thyroid disease, Lyme disease, presents for the following:  Head concussion: Head concussion 4 days ago without LOC. Currently with headache with radiation down to the neck and upper back. Neuro  exam is normal. She is concerned about the headache and concussion and would like to get a CT head. Plan: CTA head, warm compresses to the neck, stretching, Tylenol as  needed, Flexeril (watch for drowsiness). Patient does not like to take ibuprofen because the risk of bleeding in the context of recent head injury. Call if not better.

## 2017-06-02 NOTE — Patient Instructions (Addendum)
We are scheduling an CT head  Tylenol  500 mg OTC 2 tabs a day every 8 hours as needed for pain  Flexeril, a muscle relaxant, at nighttime.  Warm compress to the neck  Stretch your  neck frequently  Call if not gradually better, call if you have severe symptoms.   Concussion, Adult A concussion is a brain injury from a direct hit (blow) to the head or body. This injury causes the brain to shake quickly back and forth inside the skull. It is caused by:  A hit to the head.  A quick and sudden movement (jolt) of the head or neck.  How fast you will get better from a concussion depends on many things like how bad your concussion was, what part of your brain was hurt, how old you are, and how healthy you were before the concussion. Recovery can take time. It is important to wait to return to activity until a doctor says it is safe and your symptoms are all gone. Follow these instructions at home: Activity  Limit activities that need a lot of thought or concentration. These include: ? Homework or work for your job. ? Watching TV. ? Computer work. ? Playing memory games and puzzles.  Rest. Rest helps the brain to heal. Make sure you: ? Get plenty of sleep at night. Do not stay up late. ? Go to bed at the same time every day. ? Rest during the day. Take naps or rest breaks when you feel tired.  It can be dangerous if you get another concussion before the first one has healed Do not do activities that could cause a second concussion, such as riding a bike or playing sports.  Ask your doctor when you can return to your normal activities, like driving, riding a bike, or using machinery. Your ability to react may be slower. Do not do these activities if you are dizzy. Your doctor will likely give you a plan for slowly going back to activities. General instructions  Take over-the-counter and prescription medicines only as told by your doctor.  Do not drink alcohol until your doctor says  you can.  If it is harder than usual to remember things, write them down.  If you are easily distracted, try to do one thing at a time. For example, do not try to watch TV while making dinner.  Talk with family members or close friends when you need to make important decisions.  Watch your symptoms and tell other people to do the same. Other problems (complications) can happen after a concussion. Older adults with a brain injury may have a higher risk of serious problems, such as a blood clot in the brain.  Tell your teachers, school nurse, school counselor, coach, Event organiserathletic trainer, or work Production designer, theatre/television/filmmanager about your injury and symptoms. Tell them about what you can or cannot do. They should watch for: ? More problems with attention or concentration. ? More trouble remembering or learning new information. ? More time needed to do tasks or assignments. ? Being more annoyed (irritable) or having a harder time dealing with stress. ? Any other symptoms that get worse.  Keep all follow-up visits as told by your health care provider. This is important. Prevention  It is very important that you donot get another brain injury, especially before you have healed. In rare cases, another injury can cause permanent brain damage, brain swelling, or death. You have the most risk if you get another head injury in  the first 7-10 days after you were hurt before. To avoid injuries: ? Wear a seat belt when you ride in a car. ? Do not drink too much alcohol. ? Avoid activities that could make you get a second concussion, like contact sports. ? Wear a helmet when you do activities like:  Biking.  Skiing.  Skateboarding.  Skating. ? Make your home safe by:  Removing things from the floor or stairs that could make you trip.  Using grab bars in bathrooms and handrails by stairs.  Placing non-slip mats on floors and in bathtubs.  Putting more light in dark areas. Contact a doctor if:  Your symptoms get  worse.  You have new symptoms.  You keep having symptoms for more than 2 weeks. Get help right away if:  You have bad headaches, or your headaches get worse.  You have weakness in any part of your body.  You have loss of feeling (numbness).  You feel off balance.  You keep throwing up (vomiting).  You feel more sleepy.  The black center of one eye (pupil) is bigger than the other one.  You twitch or shake violently (convulse) or have a seizure.  Your speech is not clear (is slurred).  You feel more tired, more confused, or more annoyed.  You do not recognize people or places.  You have neck pain.  It is hard to wake you up.  You have strange behavior changes.  You pass out (lose consciousness). Summary  A concussion is a brain injury from a direct hit (blow) to the head or body.  This condition is treated with rest and careful watching of symptoms.  If you keep having symptoms for more than 2 weeks, call your doctor. This information is not intended to replace advice given to you by your health care provider. Make sure you discuss any questions you have with your health care provider. Document Released: 06/10/2009 Document Revised: 06/06/2016 Document Reviewed: 06/06/2016 Elsevier Interactive Patient Education  2017 [employer]

## 2017-06-04 ENCOUNTER — Ambulatory Visit (HOSPITAL_BASED_OUTPATIENT_CLINIC_OR_DEPARTMENT_OTHER)
Admission: RE | Admit: 2017-06-04 | Discharge: 2017-06-04 | Disposition: A | Discharge status: Home / Self Care | Payer: BLUE CROSS/BLUE SHIELD | Source: Ambulatory Visit | Attending: Internal Medicine | Admitting: Internal Medicine

## 2017-06-04 ENCOUNTER — Telehealth: Payer: Self-pay

## 2017-06-04 DIAGNOSIS — X58XXXD Exposure to other specified factors, subsequent encounter: Secondary | ICD-10-CM | POA: Insufficient documentation

## 2017-06-04 DIAGNOSIS — R51 Headache: Secondary | ICD-10-CM | POA: Diagnosis not present

## 2017-06-04 DIAGNOSIS — S060X0D Concussion without loss of consciousness, subsequent encounter: Secondary | ICD-10-CM | POA: Insufficient documentation

## 2017-06-04 NOTE — Telephone Encounter (Signed)
Letter placed at front desk for pick up

## 2017-06-04 NOTE — Telephone Encounter (Signed)
Copied from CRM [id number]. Topic: General - Other >> Jun 04, 2017 11:17 AM Clack, Princella PellegriniJessica D wrote: Reason for CRM: Pt is requesting a work note stating she was in office on 06/02/17. She will be coming by for a scan at 1 and would like to pick the note up then.

## 2017-06-04 NOTE — Telephone Encounter (Signed)
Pt came to pick up note, still having pain requesting medication. Please advise.

## 2017-06-04 NOTE — Telephone Encounter (Signed)
Letter printed, awaiting MD signature.

## 2017-06-04 NOTE — Telephone Encounter (Signed)
Advised patient:  Waiting for the CT report;  if it is negative then will be  okay to take ibuprofen 200 mg 1 or 2 every 6 hours as needed

## 2017-06-04 NOTE — Telephone Encounter (Signed)
Spoke w/ Pt, informed of results and recommendations. Pt verbalized understanding.  

## 2017-07-13 DIAGNOSIS — E559 Vitamin D deficiency, unspecified: Secondary | ICD-10-CM | POA: Diagnosis not present

## 2017-07-13 DIAGNOSIS — E063 Autoimmune thyroiditis: Secondary | ICD-10-CM | POA: Diagnosis not present

## 2017-07-13 DIAGNOSIS — M2550 Pain in unspecified joint: Secondary | ICD-10-CM | POA: Diagnosis not present

## 2017-07-13 DIAGNOSIS — E039 Hypothyroidism, unspecified: Secondary | ICD-10-CM | POA: Diagnosis not present

## 2017-07-13 DIAGNOSIS — F419 Anxiety disorder, unspecified: Secondary | ICD-10-CM | POA: Diagnosis not present

## 2017-07-13 DIAGNOSIS — I10 Essential (primary) hypertension: Secondary | ICD-10-CM | POA: Diagnosis not present

## 2017-07-20 DIAGNOSIS — E039 Hypothyroidism, unspecified: Secondary | ICD-10-CM | POA: Diagnosis not present

## 2017-07-20 DIAGNOSIS — R5383 Other fatigue: Secondary | ICD-10-CM | POA: Diagnosis not present

## 2017-07-20 DIAGNOSIS — A6920 Lyme disease, unspecified: Secondary | ICD-10-CM | POA: Diagnosis not present

## 2017-07-20 DIAGNOSIS — E559 Vitamin D deficiency, unspecified: Secondary | ICD-10-CM | POA: Diagnosis not present

## 2017-07-20 DIAGNOSIS — M2550 Pain in unspecified joint: Secondary | ICD-10-CM | POA: Diagnosis not present

## 2017-07-20 DIAGNOSIS — R748 Abnormal levels of other serum enzymes: Secondary | ICD-10-CM | POA: Diagnosis not present

## 2017-07-28 NOTE — Progress Notes (Signed)
[facility] [place] [address], [address] [place], [place] [address] [phone] [phone]  Date:  07/29/2017   Name:  Jenna GessLisa M Minkin   DOB:  02/07/1966   MRN:  [id number]  PCP:  Pearline Cablesopland, Alyzza Andringa C, MD    Chief Complaint: Cyst (c/o poss cyst on finger )   History of Present Illness:  Jenna Arroyo is a 52 y.o. very pleasant female patient who presents with the following:  History of dyslipidemia, hypothyroidism and HTN- I have seen her once in 12/17 She did have a concussion in November of 17 as well Here today with concern of a "mucinoid cyst" on her right long finger which she would like me to drain- she has been watching videos of this procedure on youtube This has been present for about 8 months  It has been getting larger- she "popped it at home" about 6 weeks ago and it drained some clear jelly like substance. It is smaller than it was but did not resolve, but it is no longer draining and is kind of hard and uncomfortable It does not restrict the motion of her finger She is otherwise doing ok today  Discussed options- can try needling the cyst again today but it may come back.  Can also see surgery to have the cyst removed. For the time being she prefers to try needling the cyst in hopes of avoiding a specialist appt  Patient Active Problem List   Diagnosis Date Noted  . Sore throat 12/11/2016  . Axillary lymphadenopathy 12/11/2016  . Lyme disease 12/11/2016  . Dyslipidemia 06/18/2016  . Essential hypertension, benign 06/18/2016  . Acquired hypothyroidism 06/18/2016    Past Medical History:  Diagnosis Date  . Thyroid disease     No past surgical history on file.  Social History   Tobacco Use  . Smoking status: Never Smoker  . Smokeless tobacco: Never Used  Substance Use Topics  . Alcohol use: No  . Drug use: No    Family History  Problem Relation Age of Onset  . Hypertension Mother   . Heart disease Father   .  Hypertension Sister   . Hypertension Brother     Allergies  Allergen Reactions  . Ceftriaxone Sodium In Dextrose Other (See Comments)    "heart races and cannot breathe"  . Guaifenesin Other (See Comments)    "heart races, dizziness, light-headedness"  . Guaifenesin & Derivatives Other (See Comments)    "heart races, dizziness, light-headedness"  . Meperidine Other (See Comments)    "heart races"  . Other Rash  . Pseudoephedrine Other (See Comments)    "heart races and high blood pressure"  . Rosuvastatin Calcium Nausea And Vomiting    Muscle aches   . Simvastatin Other (See Comments)    muscle aching   . Ciprofloxacin   . Rocephin [Ceftriaxone] Itching    Medication list has been reviewed and updated.  Current Outpatient Medications on File Prior to Visit  Medication Sig Dispense Refill  . ALPRAZolam (XANAX) 0.5 MG tablet Take 0.5 mg by mouth 3 (three) times daily as needed for anxiety.    Mack Guise. ARMOUR THYROID 15 MG tablet Take 2 tablets by mouth daily.  2  . atenolol (TENORMIN) 25 MG tablet Take 25 mg by mouth 2 (two) times daily.    Marland Kitchen. CANNABIDIOL PO Take by mouth. As directed    . cyclobenzaprine (FLEXERIL) 10 MG tablet Take 1 tablet (10 mg total)  by mouth at bedtime as needed for muscle spasms. 21 tablet 0  . DULoxetine (CYMBALTA) 60 MG capsule Take 60 mg by mouth daily.    . fluticasone (FLONASE) 50 MCG/ACT nasal spray Place 2 sprays into both nostrils daily. 16 g 1  . irbesartan (AVAPRO) 150 MG tablet Take 0.5 tablets by mouth daily. Take half tablet daily  6  . NON FORMULARY CBD OIL.  Take 1 capsule by mouth daily.    Marland Kitchen omeprazole (PRILOSEC) 40 MG capsule Take 1 capsule (40 mg total) by mouth daily. 30 capsule 3  . ondansetron (ZOFRAN ODT) 4 MG disintegrating tablet Take 1 tablet (4 mg total) by mouth every 8 (eight) hours as needed for nausea or vomiting. 20 tablet 0  . PREBIOTIC PRODUCT PO Take by mouth. Mix in shake and drink once a day.    . Probiotic Product  (PROBIOTIC DAILY PO) Take 1 capsule by mouth daily.     No current facility-administered medications on file prior to visit.     Review of Systems:  As per HPI- otherwise negative.   Physical Examination: Vitals:   07/29/17 1418  BP: 124/82  Pulse: 84  Temp: 98.4 F (36.9 C)  SpO2: 99%   Vitals:   07/29/17 1418  Weight: 235 lb 9.6 oz (106.9 kg)  Height: 5\' 6"  (1.676 m)   Body mass index is 38.03 kg/m. Ideal Body Weight: Weight in (lb) to have BMI = 25: 154.6  GEN: WDWN, NAD, Non-toxic, A & O x 3, obese, otherwise looks well  HEENT: Atraumatic, Normocephalic. Neck supple. No masses, No LAD. Ears and Nose: No external deformity. CV: RRR, No M/G/R. No JVD. No thrill. No extra heart sounds. PULM: CTA B, no wheezes, crackles, rhonchi. No retractions. No resp. distress. No accessory muscle use. EXTR: No c/c/e NEURO Normal gait.  PSYCH: Normally interactive. Conversant. Not depressed or anxious appearing.  Calm demeanor.  Right long finger, dorsal aspect over DIP displays a typical mucous cyst  Area prepped with alcohol pad x2. Pt requested cold spray which was employed.  Injected lateral aspect of cyst with a small wheal of 2% lido- clear jelly material spontaneously drained through needle defect in the skin. Squeezed gently to express all material and then placed a band-aid   Assessment and Plan: Mucous cyst of digit of right hand  Needle drainage of cyst as above.  Pt is aware of recurrence rate of at least 40%.   She will watch carefully for any sign of infection See patient instructions for more details.     Signed Abbe Amsterdam, MD

## 2017-07-29 ENCOUNTER — Encounter: Payer: Self-pay | Admitting: Family Medicine

## 2017-07-29 ENCOUNTER — Ambulatory Visit: Payer: BLUE CROSS/BLUE SHIELD | Admitting: Family Medicine

## 2017-07-29 VITALS — BP 124/82 | HR 84 | Temp 98.4°F | Ht 66.0 in | Wt 235.6 lb

## 2017-07-29 DIAGNOSIS — M67441 Ganglion, right hand: Secondary | ICD-10-CM | POA: Diagnosis not present

## 2017-07-29 NOTE — Patient Instructions (Addendum)
We drained the mucous cyst on your finger today.  Keep a snug band- aid or tape on the area for a couple of days to try and keep the material from coming back.  If it does return let me know and we can have you see a hand surgeon for a more definitive procedure.  If any sign of infection- swelling, heat, redness- please let me know right away

## 2017-08-02 ENCOUNTER — Encounter: Payer: Self-pay | Admitting: Medical

## 2017-08-02 ENCOUNTER — Ambulatory Visit: Payer: BLUE CROSS/BLUE SHIELD | Admitting: Medical

## 2017-08-02 VITALS — BP 129/93 | HR 87 | Temp 98.6°F | Resp 16 | Ht 66.0 in | Wt 232.4 lb

## 2017-08-02 DIAGNOSIS — J0100 Acute maxillary sinusitis, unspecified: Secondary | ICD-10-CM | POA: Diagnosis not present

## 2017-08-02 DIAGNOSIS — M791 Myalgia, unspecified site: Secondary | ICD-10-CM | POA: Diagnosis not present

## 2017-08-02 DIAGNOSIS — R0981 Nasal congestion: Secondary | ICD-10-CM

## 2017-08-02 DIAGNOSIS — J029 Acute pharyngitis, unspecified: Secondary | ICD-10-CM | POA: Diagnosis not present

## 2017-08-02 DIAGNOSIS — R059 Cough, unspecified: Secondary | ICD-10-CM

## 2017-08-02 DIAGNOSIS — R05 Cough: Secondary | ICD-10-CM | POA: Diagnosis not present

## 2017-08-02 LAB — POCT INFLUENZA A/B
Influenza A, POC: NEGATIVE
Influenza B, POC: NEGATIVE

## 2017-08-02 LAB — POCT RAPID STREP A (OFFICE): Rapid Strep A Screen: NEGATIVE

## 2017-08-02 MED ORDER — FLUTICASONE PROPIONATE 50 MCG/ACT NA SUSP: 2.0000 | Freq: Every day | NASAL | 1 refills | Status: DC

## 2017-08-02 NOTE — Progress Notes (Signed)
Subjective:    Patient ID: Jenna Arroyo, female    DOB: Jul 15, 1965, 52 y.o.   MRN: [id number]  HPI  Pt has nasal congestion, st, fatigue, runny nose and cough. She had this since past 4 days.  Pt has been taking care of niece who had st and some possible fever. Pt had some achiness all for 2 days.    Review of Systems  Constitutional: Positive for fatigue and fever. Negative for chills and diaphoresis.  HENT: Positive for congestion, sore throat and voice change. Negative for ear pain, nosebleeds, postnasal drip, rhinorrhea, sinus pressure, sinus pain and trouble swallowing.   Respiratory: Negative for cough, chest tightness, shortness of breath and wheezing.   Cardiovascular: Negative for chest pain and palpitations.  Gastrointestinal: Negative for abdominal pain, blood in stool, nausea and vomiting.  Genitourinary: Negative for dysuria, frequency and genital sores.  Musculoskeletal: Positive for myalgias. Negative for back pain.  Skin: Negative for rash.  Neurological: Negative for dizziness, speech difficulty, light-headedness and headaches.  Hematological: Negative for adenopathy. Does not bruise/bleed easily.  Psychiatric/Behavioral: Negative for behavioral problems and confusion. The patient is not nervous/anxious.      Past Medical History:  Diagnosis Date  . Thyroid disease      Social History   Socioeconomic History  . Marital status: Divorced    Spouse name: Not on file  . Number of children: Not on file  . Years of education: Not on file  . Highest education level: Not on file  Social Needs  . Financial resource strain: Not on file  . Food insecurity - worry: Not on file  . Food insecurity - inability: Not on file  . Transportation needs - medical: Not on file  . Transportation needs - non-medical: Not on file  Occupational History  . Not on file  Tobacco Use  . Smoking status: Never Smoker  . Smokeless tobacco: Never Used  Substance and Sexual Activity    . Alcohol use: No  . Drug use: No  . Sexual activity: No  Other Topics Concern  . Not on file  Social History Narrative  . Not on file    No past surgical history on file.  Family History  Problem Relation Age of Onset  . Hypertension Mother   . Heart disease Father   . Hypertension Sister   . Hypertension Brother     Allergies  Allergen Reactions  . Ceftriaxone Sodium In Dextrose Other (See Comments)    "heart races and cannot breathe"  . Guaifenesin Other (See Comments)    "heart races, dizziness, light-headedness"  . Guaifenesin & Derivatives Other (See Comments)    "heart races, dizziness, light-headedness"  . Meperidine Other (See Comments)    "heart races"  . Other Rash  . Pseudoephedrine Other (See Comments)    "heart races and high blood pressure"  . Rosuvastatin Calcium Nausea And Vomiting    Muscle aches   . Simvastatin Other (See Comments)    muscle aching   . Ciprofloxacin   . Rocephin [Ceftriaxone] Itching    Current Outpatient Medications on File Prior to Visit  Medication Sig Dispense Refill  . ALPRAZolam (XANAX) 0.5 MG tablet Take 0.5 mg by mouth 3 (three) times daily as needed for anxiety.    Mack Guise THYROID 15 MG tablet Take 2 tablets by mouth daily.  2  . atenolol (TENORMIN) 25 MG tablet Take 25 mg by mouth 2 (two) times daily.    Marland Kitchen CANNABIDIOL PO  Take by mouth. As directed    . cyclobenzaprine (FLEXERIL) 10 MG tablet Take 1 tablet (10 mg total) by mouth at bedtime as needed for muscle spasms. 21 tablet 0  . DULoxetine (CYMBALTA) 60 MG capsule Take 60 mg by mouth daily.    . fluticasone (FLONASE) 50 MCG/ACT nasal spray Place 2 sprays into both nostrils daily. 16 g 1  . irbesartan (AVAPRO) 150 MG tablet Take 0.5 tablets by mouth daily. Take half tablet daily  6  . NON FORMULARY CBD OIL.  Take 1 capsule by mouth daily.    Marland Kitchen. omeprazole (PRILOSEC) 40 MG capsule Take 1 capsule (40 mg total) by mouth daily. 30 capsule 3  . ondansetron (ZOFRAN ODT) 4  MG disintegrating tablet Take 1 tablet (4 mg total) by mouth every 8 (eight) hours as needed for nausea or vomiting. 20 tablet 0  . PREBIOTIC PRODUCT PO Take by mouth. Mix in shake and drink once a day.    . Probiotic Product (PROBIOTIC DAILY PO) Take 1 capsule by mouth daily.     No current facility-administered medications on file prior to visit.     BP (!) 129/93   Pulse 87   Temp 98.6 F (37 C) (Oral)   Resp 16   Ht 5\' 6"  (1.676 m)   Wt 232 lb 6.4 oz (105.4 kg)   SpO2 98%   BMI 37.51 kg/m      Objective:   Physical Exam   General  Mental Status - Alert. General Appearance - Well groomed. Not in acute distress.  Skin Rashes- No Rashes.  HEENT Head- Normal. Ear Auditory Canal - Left- Normal. Right - Normal.Tympanic Membrane- Left- Normal. Right- Normal. Eye Sclera/Conjunctiva- Left- Normal. Right- Normal. Nose & Sinuses Nasal Mucosa- Left-  Boggy and Congested. Right-  Boggy and  Congested.Bilateral faint maxillary  but no frontal sinus pressure. Mouth & Throat Lips: Upper Lip- Normal: no dryness, cracking, pallor, cyanosis, or vesicular eruption. Lower Lip-Normal: no dryness, cracking, pallor, cyanosis or vesicular eruption. Buccal Mucosa- Bilateral- No Aphthous ulcers. Oropharynx- No Discharge or Erythema. Tonsils: Characteristics- Bilateral- No Erythema or Congestion. Size/Enlargement- Bilateral- No enlargement. Discharge- bilateral-None.  Neck Neck- Supple. No Masses.   Chest and Lung Exam Auscultation: Breath Sounds:-Clear even and unlabored.  Cardiovascular Auscultation:Rythm- Regular, rate and rhythm. Murmurs & Other Heart Sounds:Ausculatation of the heart reveal- No Murmurs.  Lymphatic Head & Neck General Head & Neck Lymphatics: Bilateral: Description- No Localized lymphadenopathy.      Assessment & Plan:  Your rapid strep and flu test were both negative.  You may have viral uri vs early sinus infection. For congestion can use flonase. Rx  given.  For cough that is minimal use  otc cough drops. If cough worsens then let me know and could rx benzonatate.  You express reluctance to use antibiotics but if sinus pain worsens, st or if bronchitis type symptoms occur then notify me and could rx azithromycin.  Follow up in 7 days or as needed  Whole FoodsEdward Lashandra Arauz, [employer] CorporationPA-C

## 2017-08-02 NOTE — Patient Instructions (Addendum)
Your rapid strep and flu test were both negative.  You may have viral uri vs early sinus infection. For congestion can use flonase. Rx given.  For cough that is minimal use otc cough drops. If cough worsens then let me know and could rx benzonatate.  You express reluctance to use antibiotics but if sinus pain worsens, st or if bronchitis type symptoms occur then notify me and could rx azithromycin.  Follow up in 7 days or as needed

## 2017-08-03 ENCOUNTER — Telehealth: Payer: Self-pay

## 2017-08-03 NOTE — Telephone Encounter (Signed)
Pt may need to come in. We have to know what is causing he to have trouble breathing.

## 2017-08-03 NOTE — Telephone Encounter (Signed)
Copied from CRM [id number]. Topic: Quick Communication - Rx Refill/Question >> Aug 02, 2017  6:28 PM Alexander BergeronBarksdale, Harvey B wrote: Reason for CRM: pt called and stated that she forgot to  tell the doctor that she couldn't breathe/ having trouble breathing, pt would like to have something called in, contact pt to advise

## 2017-08-06 ENCOUNTER — Telehealth: Payer: Self-pay | Admitting: Emergency Medicine

## 2017-08-06 MED ORDER — IRBESARTAN 150 MG PO TABS: 75.0000 mg | ORAL_TABLET | Freq: Every day | ORAL | 2 refills | Status: DC

## 2017-08-27 ENCOUNTER — Ambulatory Visit: Payer: Self-pay | Admitting: Medical

## 2017-08-27 DIAGNOSIS — Z0289 Encounter for other administrative examinations: Secondary | ICD-10-CM

## 2017-08-28 DIAGNOSIS — J9801 Acute bronchospasm: Secondary | ICD-10-CM | POA: Diagnosis not present

## 2017-08-31 ENCOUNTER — Encounter: Payer: Self-pay | Admitting: Medical

## 2017-09-03 DIAGNOSIS — J209 Acute bronchitis, unspecified: Secondary | ICD-10-CM | POA: Diagnosis not present

## 2017-09-03 DIAGNOSIS — J9801 Acute bronchospasm: Secondary | ICD-10-CM | POA: Diagnosis not present

## 2017-09-09 DIAGNOSIS — A6920 Lyme disease, unspecified: Secondary | ICD-10-CM | POA: Diagnosis not present

## 2017-09-09 DIAGNOSIS — M2550 Pain in unspecified joint: Secondary | ICD-10-CM | POA: Diagnosis not present

## 2017-09-09 DIAGNOSIS — R5383 Other fatigue: Secondary | ICD-10-CM | POA: Diagnosis not present

## 2017-09-09 DIAGNOSIS — E039 Hypothyroidism, unspecified: Secondary | ICD-10-CM | POA: Diagnosis not present

## 2017-09-16 ENCOUNTER — Telehealth: Payer: Self-pay | Admitting: Family Medicine

## 2017-09-16 NOTE — Telephone Encounter (Signed)
Copied from CRM [id number]. Topic: Quick Communication - Rx Refill/Question >> Sep 16, 2017  2:09 PM Jolayne Hainesaylor, Brittany L wrote: Medication:  irbesartan (AVAPRO) 150 MG tablet  Has the patient contacted their pharmacy? Yes on yesterday   (Agent: If no, request that the patient contact the pharmacy for the refill.)   Preferred Pharmacy (with phone number or street name): [facility] [phone] - [place] - [address]   Agent: Please be advised that RX refills may take up to 3 business days. We ask that you follow-up with your pharmacy.

## 2017-09-17 ENCOUNTER — Other Ambulatory Visit: Payer: Self-pay

## 2017-09-17 MED ORDER — IRBESARTAN 150 MG PO TABS: 75.0000 mg | ORAL_TABLET | Freq: Every day | ORAL | 2 refills | Status: DC

## 2017-10-14 DIAGNOSIS — F411 Generalized anxiety disorder: Secondary | ICD-10-CM | POA: Diagnosis not present

## 2017-10-21 DIAGNOSIS — M2550 Pain in unspecified joint: Secondary | ICD-10-CM | POA: Diagnosis not present

## 2017-10-21 DIAGNOSIS — E063 Autoimmune thyroiditis: Secondary | ICD-10-CM | POA: Diagnosis not present

## 2017-10-21 DIAGNOSIS — E039 Hypothyroidism, unspecified: Secondary | ICD-10-CM | POA: Diagnosis not present

## 2017-10-21 DIAGNOSIS — A6920 Lyme disease, unspecified: Secondary | ICD-10-CM | POA: Diagnosis not present

## 2017-10-21 DIAGNOSIS — R5383 Other fatigue: Secondary | ICD-10-CM | POA: Diagnosis not present

## 2017-10-21 DIAGNOSIS — E559 Vitamin D deficiency, unspecified: Secondary | ICD-10-CM | POA: Diagnosis not present

## 2017-10-21 DIAGNOSIS — I10 Essential (primary) hypertension: Secondary | ICD-10-CM | POA: Diagnosis not present

## 2017-11-12 IMAGING — US US ABDOMEN COMPLETE
1 series · 14 of 25 positions shown · non-contrast
Comparison: [REDACTED] right upper quadrant
ultrasound 04/01/2015 and CT Abdomen and Pelvis 03/15/2012.

CLINICAL DATA: 51-year-old female with abnormal LFTs.

EXAM:
ABDOMEN ULTRASOUND COMPLETE

[Series 1: us abdomen complete · 0.21mm/px · 14 of 68 slices shown]
[im 1/68]
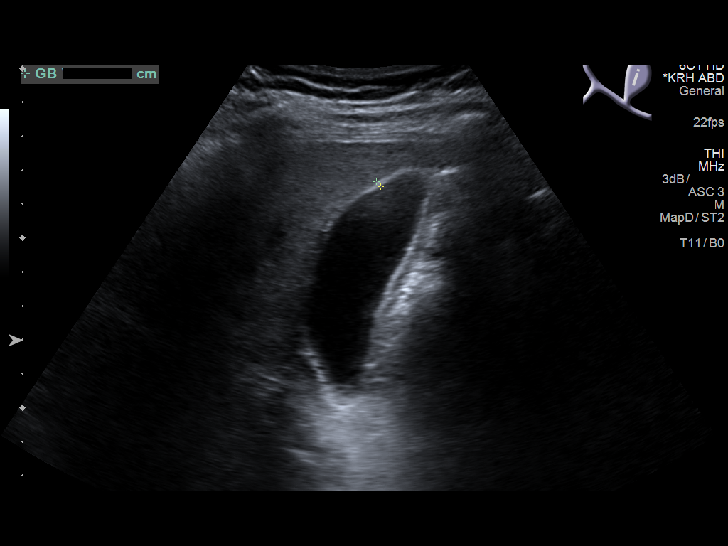
[im 6/68]
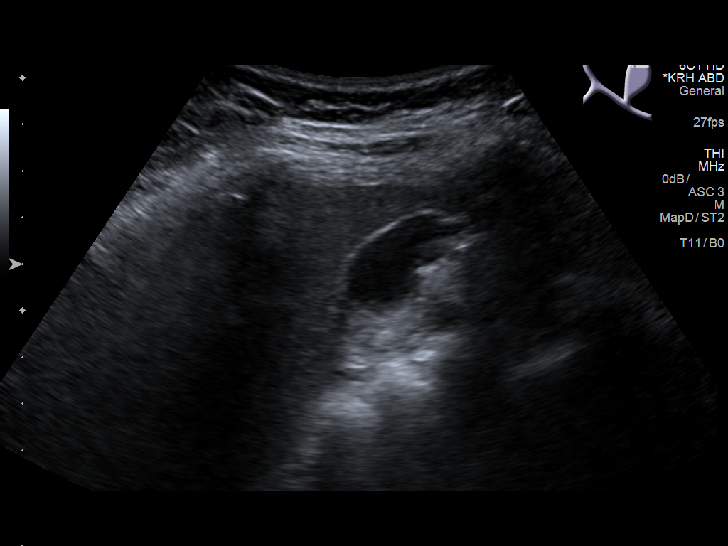
[im 12/68]
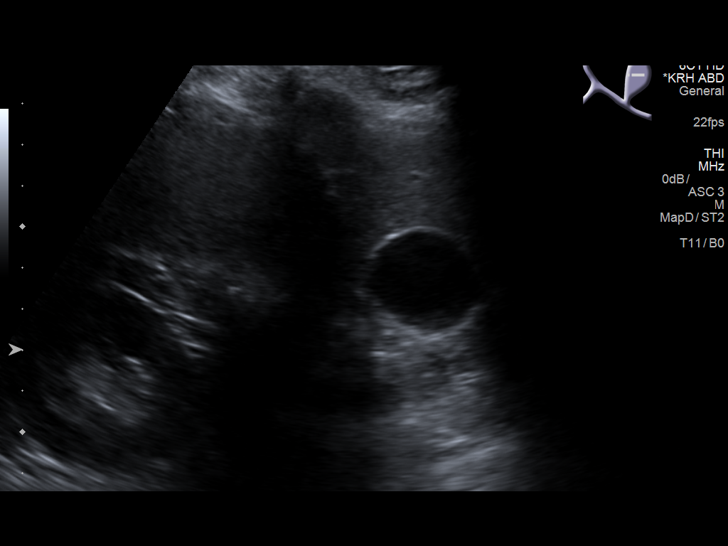
[im 17/68]
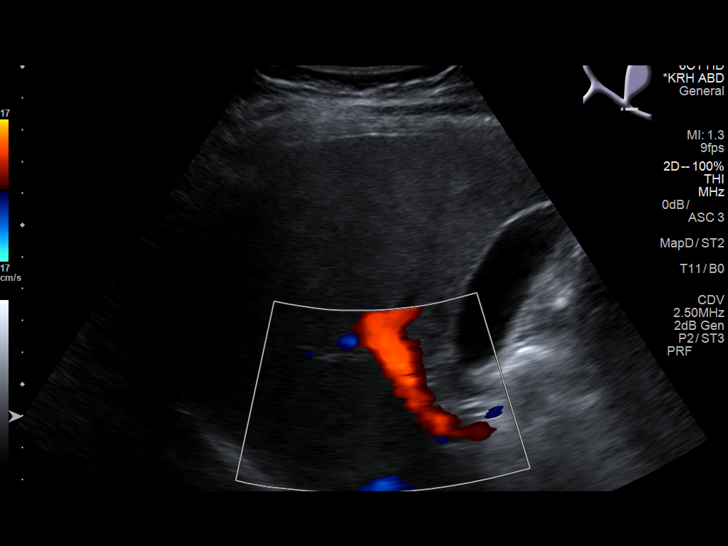
[im 23/68]
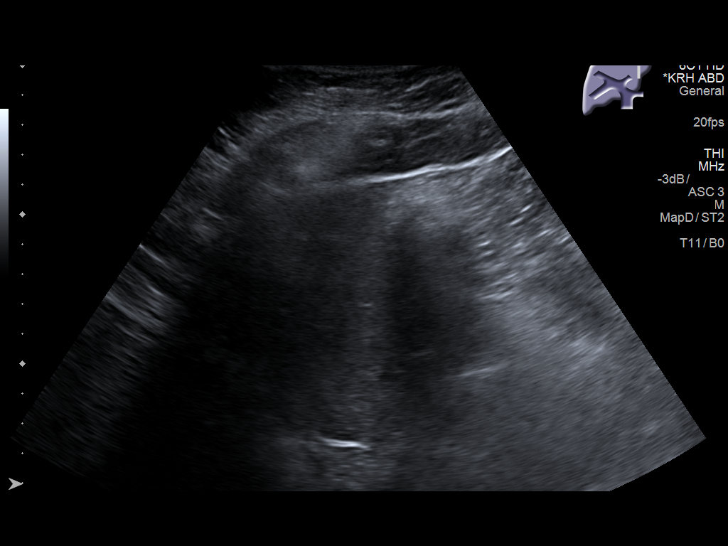
[im 26/68]
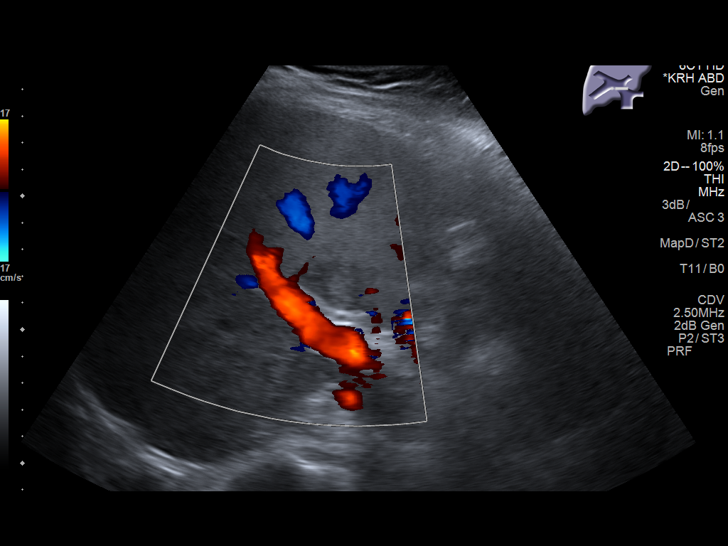
[im 31/68]
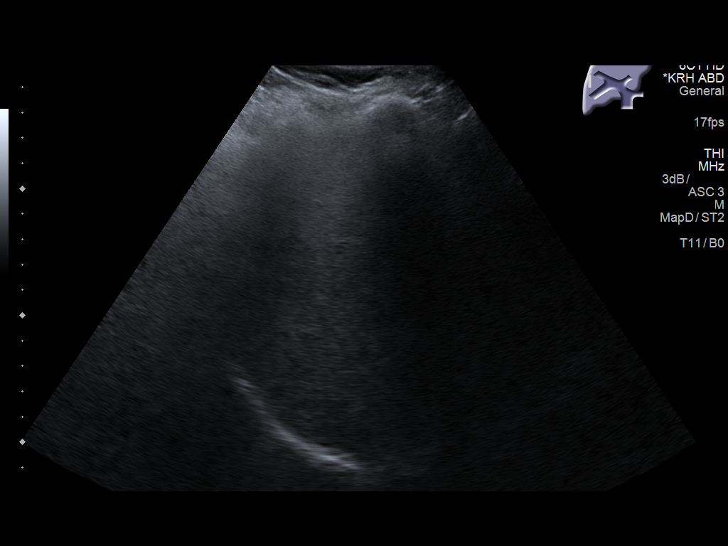
[im 37/68]
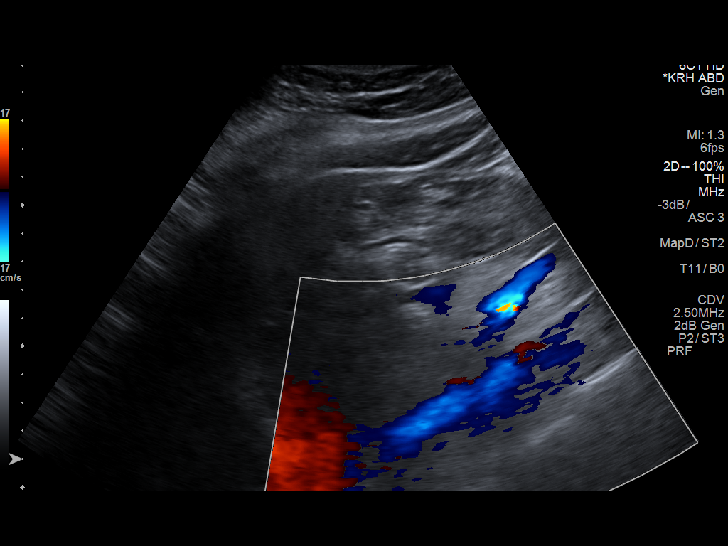
[im 42/68]
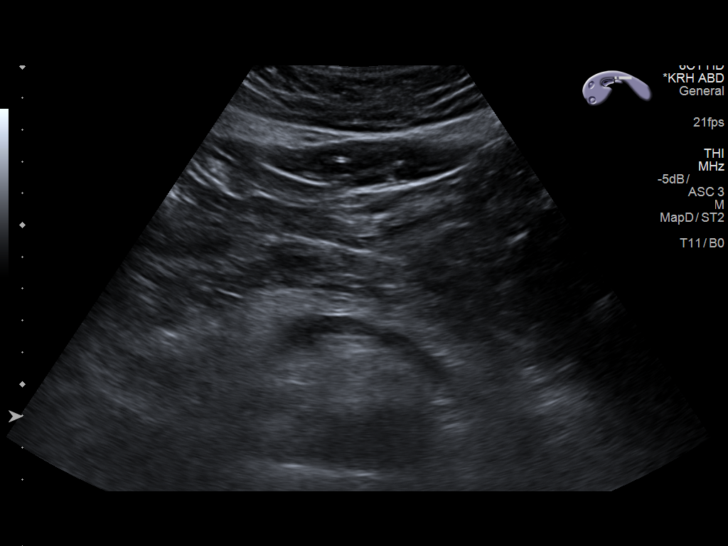
[im 45/68]
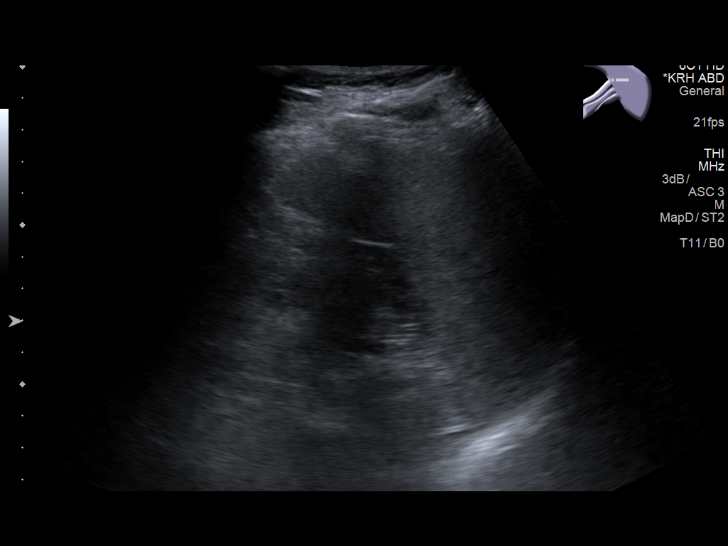
[im 51/68]
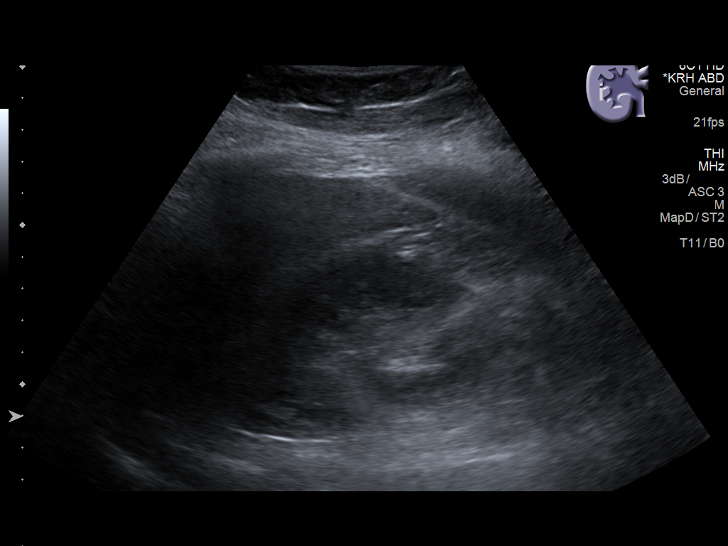
[im 56/68]
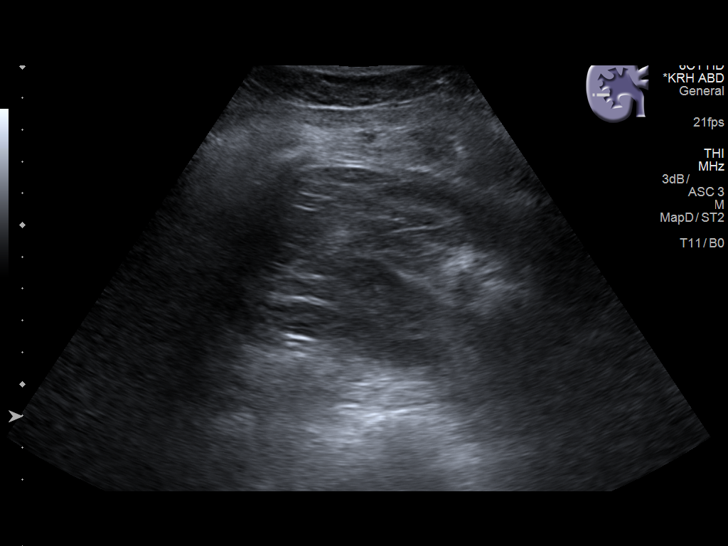
[im 62/68]
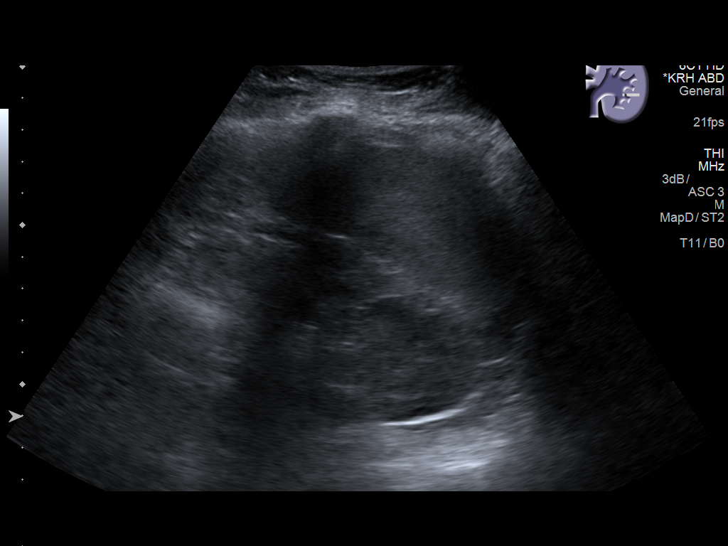
[im 68/68]
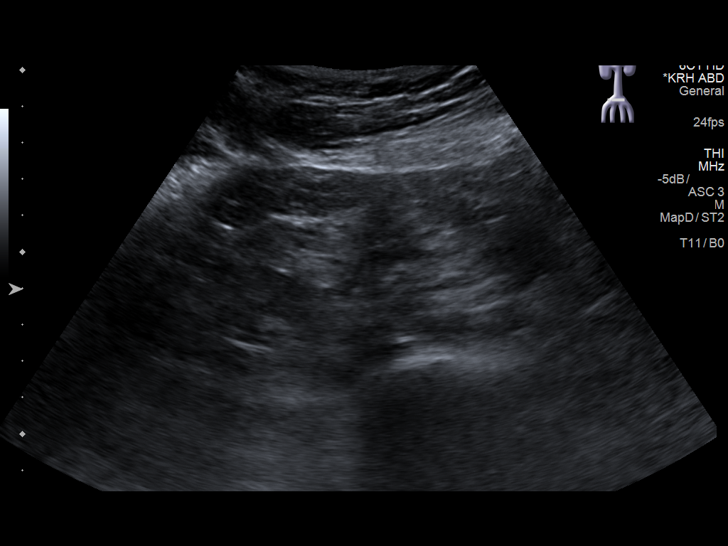

[14 of 25 positions shown; findings below may reference images not displayed]

FINDINGS: Gallbladder: Phrygian cap again noted (normal variant). Questionable
dependent gallbladder sludge, but no gallstones or wall thickening.
No sonographic Murphy sign noted by sonographer.

Common bile duct: Diameter: 2 mm, normal

Liver: Chronically increased liver echogenicity (image 37 today). No
intrahepatic biliary ductal dilatation. No discrete liver lesion.

IVC: No abnormality visualized.

Pancreas: Visualized portion unremarkable.

Spleen: Size and appearance within normal limits.

Right Kidney: Length: 11.1 cm. Echogenicity within normal limits. No
mass or hydronephrosis visualized.

Left Kidney: Length: 10.8 cm. Incidental dromedary hump (normal
variant). Echogenicity within normal limits. No mass or
hydronephrosis visualized.

Abdominal aorta: No aneurysm visualized.

Other findings: None.
IMPRESSION: 1. Chronic hepatic steatosis.
2. No acute findings in the abdomen.

## 2017-11-17 DIAGNOSIS — R748 Abnormal levels of other serum enzymes: Secondary | ICD-10-CM | POA: Diagnosis not present

## 2017-11-17 DIAGNOSIS — R5383 Other fatigue: Secondary | ICD-10-CM | POA: Diagnosis not present

## 2017-11-17 DIAGNOSIS — F419 Anxiety disorder, unspecified: Secondary | ICD-10-CM | POA: Diagnosis not present

## 2017-11-17 DIAGNOSIS — I10 Essential (primary) hypertension: Secondary | ICD-10-CM | POA: Diagnosis not present

## 2017-11-17 DIAGNOSIS — E559 Vitamin D deficiency, unspecified: Secondary | ICD-10-CM | POA: Diagnosis not present

## 2017-11-17 DIAGNOSIS — E039 Hypothyroidism, unspecified: Secondary | ICD-10-CM | POA: Diagnosis not present

## 2017-11-17 DIAGNOSIS — A6920 Lyme disease, unspecified: Secondary | ICD-10-CM | POA: Diagnosis not present

## 2017-11-17 DIAGNOSIS — E063 Autoimmune thyroiditis: Secondary | ICD-10-CM | POA: Diagnosis not present

## 2017-11-17 DIAGNOSIS — E785 Hyperlipidemia, unspecified: Secondary | ICD-10-CM | POA: Diagnosis not present

## 2017-11-24 DIAGNOSIS — R0982 Postnasal drip: Secondary | ICD-10-CM | POA: Diagnosis not present

## 2017-11-24 DIAGNOSIS — R05 Cough: Secondary | ICD-10-CM | POA: Diagnosis not present

## 2017-11-24 DIAGNOSIS — J9801 Acute bronchospasm: Secondary | ICD-10-CM | POA: Diagnosis not present

## 2017-12-03 DIAGNOSIS — R05 Cough: Secondary | ICD-10-CM | POA: Diagnosis not present

## 2017-12-03 DIAGNOSIS — H9201 Otalgia, right ear: Secondary | ICD-10-CM | POA: Diagnosis not present

## 2017-12-09 DIAGNOSIS — E039 Hypothyroidism, unspecified: Secondary | ICD-10-CM | POA: Diagnosis not present

## 2017-12-09 DIAGNOSIS — R5383 Other fatigue: Secondary | ICD-10-CM | POA: Diagnosis not present

## 2017-12-09 DIAGNOSIS — A6920 Lyme disease, unspecified: Secondary | ICD-10-CM | POA: Diagnosis not present

## 2017-12-09 DIAGNOSIS — M2550 Pain in unspecified joint: Secondary | ICD-10-CM | POA: Diagnosis not present

## 2018-01-02 ENCOUNTER — Encounter (HOSPITAL_BASED_OUTPATIENT_CLINIC_OR_DEPARTMENT_OTHER): Payer: Self-pay | Admitting: Emergency Medicine

## 2018-01-02 ENCOUNTER — Other Ambulatory Visit: Payer: Self-pay

## 2018-01-02 ENCOUNTER — Emergency Department (HOSPITAL_BASED_OUTPATIENT_CLINIC_OR_DEPARTMENT_OTHER)
Admission: EM | Admit: 2018-01-02 | Discharge: 2018-01-02 | Disposition: A | Discharge status: Home / Self Care | Payer: BLUE CROSS/BLUE SHIELD | Attending: Emergency Medicine | Admitting: Emergency Medicine

## 2018-01-02 DIAGNOSIS — I10 Essential (primary) hypertension: Secondary | ICD-10-CM

## 2018-01-02 DIAGNOSIS — E039 Hypothyroidism, unspecified: Secondary | ICD-10-CM | POA: Insufficient documentation

## 2018-01-02 DIAGNOSIS — Z79899 Other long term (current) drug therapy: Secondary | ICD-10-CM | POA: Insufficient documentation

## 2018-01-02 NOTE — ED Triage Notes (Signed)
Patient states that she has been under some stress recently and has been forgetting to take her blood pressure medication. The patient states that she took her pressure PTA when she states that she felt "weird"  - the patient reports that she had a DBP  Over 100 at home

## 2018-01-02 NOTE — ED Notes (Signed)
Pt verbalized understanding of discharge instructions.

## 2018-01-02 NOTE — ED Provider Notes (Signed)
[facility] EMERGENCY DEPARTMENT Provider Note   CSN: [id number] Arrival date & time: 01/02/18  1854     History   Chief Complaint Chief Complaint  Patient presents with  . Hypertension    HPI Jenna Arroyo is a 52 y.o. female.  52 y/o with PMH of HTN and lyme disease presents to the ED complaining of high blood pressure since this morning. Patient did not take her BP medication this morning.Patient states she checked her BP at home and the reading was 130-140/115. She called the after hours service at her PCP and was told to come in to the ED. Patient denies any headache, chest pain, shortness of breath.      Past Medical History:  Diagnosis Date  . Thyroid disease     Patient Active Problem List   Diagnosis Date Noted  . Sore throat 12/11/2016  . Axillary lymphadenopathy 12/11/2016  . Lyme disease 12/11/2016  . Dyslipidemia 06/18/2016  . Essential hypertension, benign 06/18/2016  . Acquired hypothyroidism 06/18/2016    History reviewed. No pertinent surgical history.   OB History   None      Home Medications    Prior to Admission medications   Medication Sig Start Date End Date Taking? Authorizing Provider  ALPRAZolam Prudy Feeler) 0.5 MG tablet Take 0.5 mg by mouth 3 (three) times daily as needed for anxiety.    [provider]  ARMOUR THYROID 15 MG tablet Take 2 tablets by mouth daily. 11/20/16   [provider]  atenolol (TENORMIN) 25 MG tablet Take 25 mg by mouth 2 (two) times daily.    [provider]  CANNABIDIOL PO Take by mouth. As directed    [provider]  cyclobenzaprine (FLEXERIL) 10 MG tablet Take 1 tablet (10 mg total) by mouth at bedtime as needed for muscle spasms. 06/02/17   Wanda Plump, MD  DULoxetine (CYMBALTA) 60 MG capsule Take 60 mg by mouth daily.    [provider]  fluticasone (FLONASE) 50 MCG/ACT nasal spray Place 2 sprays into both nostrils daily. 08/02/17   Saguier, [name], PA-C    irbesartan (AVAPRO) 150 MG tablet Take 0.5 tablets (75 mg total) by mouth daily. Take half tablet daily 09/17/17   Copland, Gwenlyn Found, MD  NON FORMULARY CBD OIL.  Take 1 capsule by mouth daily.    [provider]  omeprazole (PRILOSEC) 40 MG capsule Take 1 capsule (40 mg total) by mouth daily. 03/02/17   Sandford Craze, NP  ondansetron (ZOFRAN ODT) 4 MG disintegrating tablet Take 1 tablet (4 mg total) by mouth every 8 (eight) hours as needed for nausea or vomiting. 03/02/17   Sandford Craze, NP  PREBIOTIC PRODUCT PO Take by mouth. Mix in shake and drink once a day.    [provider]  Probiotic Product (PROBIOTIC DAILY PO) Take 1 capsule by mouth daily.    [provider]    Family History Family History  Problem Relation Age of Onset  . Hypertension Mother   . Heart disease Father   . Hypertension Sister   . Hypertension Brother     Social History Social History   Tobacco Use  . Smoking status: Never Smoker  . Smokeless tobacco: Never Used  Substance Use Topics  . Alcohol use: No  . Drug use: No     Allergies   Ceftriaxone sodium in dextrose; Guaifenesin; Guaifenesin & derivatives; Meperidine; Other; Pseudoephedrine; Rosuvastatin calcium; Simvastatin; Ciprofloxacin; and Rocephin [ceftriaxone]   Review of Systems  Review of Systems  Respiratory: Negative for shortness of breath.   Cardiovascular: Negative for chest pain and palpitations.  Gastrointestinal: Negative for abdominal pain, diarrhea, nausea and vomiting.  Musculoskeletal: Negative for back pain.  Neurological: Negative for syncope, weakness, light-headedness and headaches.  All other systems reviewed and are negative.    Physical Exam Updated Vital Signs BP (!) 145/88 (BP Location: Left Arm)   Pulse 84   Temp 98.4 F (36.9 C) (Oral)   Resp 18   Ht 5\' 6"  (1.676 m)   Wt 105.2 kg (232 lb)   SpO2 100%   BMI 37.45 kg/m   Physical Exam  Constitutional: She is oriented  to person, place, and time. She appears well-developed and well-nourished.  HENT:  Head: Normocephalic and atraumatic.  Cardiovascular: Normal heart sounds.  Pulmonary/Chest: Effort normal and breath sounds normal. No respiratory distress.  Abdominal: Soft. Bowel sounds are normal. She exhibits no distension. There is no tenderness.  Musculoskeletal: She exhibits no tenderness or deformity.  Neurological: She is alert and oriented to person, place, and time.  Nursing note and vitals reviewed.    ED Treatments / Results  Labs (all labs ordered are listed, but only abnormal results are displayed) Labs Reviewed - No data to display  EKG None  Radiology No results found.  Procedures Procedures (including critical care time)  Medications Ordered in ED Medications - No data to display   Initial Impression / Assessment and Plan / ED Course  I have reviewed the triage vital signs and the nursing notes.  Pertinent labs & imaging results that were available during my care of the patient were reviewed by me and considered in my medical decision making (see chart for details).     Patient seen in the ED for high blood pressure.Patient states she forgot to take her BP medication today and when she checked her pressure it was running 130-140/115.Patient called the after hours service and was told to come into the ED.Patient is lying comfortable me in the bed carrying a conversation with family member at the bedside. Patient denies any headache, shortness of breath, or chest pain.Spoke to patient about BP management and following up with her PCP.Patient will follow up with PCP and return to the ED if she experience any of the mentioned symptoms.     Final Clinical Impressions(s) / ED Diagnoses   Final diagnoses:  Essential hypertension    ED Discharge Orders    None       Claude MangesSoto, Jenniger Figiel, PA-C 01/02/18 2020    Rolan BuccoBelfi, Melanie, MD 01/02/18 2046

## 2018-01-02 NOTE — Discharge Instructions (Signed)
Today you were seen in the ED for High blood pressure. Please continue to take your blood pressure daily. If you experience chest pain, shortness of breath please return to the Ed.

## 2018-01-04 ENCOUNTER — Telehealth: Payer: Self-pay

## 2018-01-04 NOTE — Telephone Encounter (Signed)
It looks like she was seen in the ER because her BP was high - she had forgotten to take her medication.  Please give hear call back- as long as her BP is under control again (less than about  150/90) it is ok to be seen on the 10th. If she would record some home BP readings for me in the interim that would be great   Thank you! JC  BP Readings from Last 3 Encounters:  01/02/18 (!) 145/88  08/02/17 (!) 129/93  07/29/17 124/82

## 2018-01-04 NOTE — Telephone Encounter (Signed)
Should I double book this patient as well?

## 2018-01-04 NOTE — Telephone Encounter (Signed)
Copied from CRM [phone]. Topic: Quick Communication - See Telephone Encounter >> Jan 03, 2018 11:17 AM Herby AbrahamJohnson, Shiquita C wrote: CRM for notification. See Telephone encounter for: 01/03/18.  Pt called in to schedule a ED follow up on providers next available. Pt says that she was advised by the hospital to call. Pt would like to be sure that it is okay to wait until Wednesday 01/12/18 to see a provider because hospital made it seem like she need to be seen soon as possible?   Please advise.

## 2018-01-05 NOTE — Telephone Encounter (Signed)
Left message to return call. Ok for pec to discuss.  

## 2018-01-08 NOTE — Progress Notes (Deleted)
[facility] [address], [address] [place], [place] [address] [phone]  Date:  01/12/2018   Name:  Jenna Arroyo   DOB:  02-Aug-1965   MRN:  [id number]  PCP:  Pearline Cables, MD    Chief Complaint: No chief complaint on file.   History of Present Illness:  Jenna Arroyo is a 52 y.o. very pleasant female patient who presents with the following:  Following up from an ER visit today- she was seen in the ER on 6/30 as follows: 52 y/o with PMH of HTN and lyme disease presents to the ED complaining of high blood pressure since this morning. Patient did not take her BP medication this morning.Patient states she checked her BP at home and the reading was 130-140/115. She called the after hours service at her PCP and was told to come in to the ED. Patient denies any headache, chest pain, shortness of breath. //////////////////////////////////// Patient seen in the ED for high blood pressure.Patient states she forgot to take her BP medication today and when she checked her pressure it was running 130-140/115.Patient called the after hours service and was told to come into the ED.Patient is lying comfortable me in the bed carrying a conversation with family member at the bedside. Patient denies any headache, shortness of breath, or chest pain.Spoke to patient about BP management and following up with her PCP.Patient will follow up with PCP and return to the ED if she experience any of the mentioned symptoms.     BP Readings from Last 3 Encounters:  01/02/18 (!) 145/88  08/02/17 (!) 129/93  07/29/17 124/82   She is using atenolol and irbersartan  She is behind on several health maint issues: Mammo: Pap: Colon:  Patient Active Problem List   Diagnosis Date Noted  . Axillary lymphadenopathy 12/11/2016  . Lyme disease 12/11/2016  . Dyslipidemia 06/18/2016  . Essential hypertension, benign 06/18/2016  . Acquired hypothyroidism  06/18/2016    Past Medical History:  Diagnosis Date  . Thyroid disease     No past surgical history on file.  Social History   Tobacco Use  . Smoking status: Never Smoker  . Smokeless tobacco: Never Used  Substance Use Topics  . Alcohol use: No  . Drug use: No    Family History  Problem Relation Age of Onset  . Hypertension Mother   . Heart disease Father   . Hypertension Sister   . Hypertension Brother     Allergies  Allergen Reactions  . Ceftriaxone Sodium In Dextrose Other (See Comments)    "heart races and cannot breathe"  . Guaifenesin Other (See Comments)    "heart races, dizziness, light-headedness"  . Guaifenesin & Derivatives Other (See Comments)    "heart races, dizziness, light-headedness"  . Meperidine Other (See Comments)    "heart races"  . Other Rash  . Pseudoephedrine Other (See Comments)    "heart races and high blood pressure"  . Rosuvastatin Calcium Nausea And Vomiting    Muscle aches   . Simvastatin Other (See Comments)    muscle aching   . Ciprofloxacin   . Rocephin [Ceftriaxone] Itching    Medication list has been reviewed and updated.  Current Outpatient Medications on File Prior to Visit  Medication Sig Dispense Refill  . ALPRAZolam (XANAX) 0.5 MG tablet Take 0.5 mg by mouth 3 (three) times daily as needed for anxiety.    Mack Guise THYROID 15 MG  tablet Take 2 tablets by mouth daily.  2  . atenolol (TENORMIN) 25 MG tablet Take 25 mg by mouth 2 (two) times daily.    Marland Kitchen. CANNABIDIOL PO Take by mouth. As directed    . cyclobenzaprine (FLEXERIL) 10 MG tablet Take 1 tablet (10 mg total) by mouth at bedtime as needed for muscle spasms. 21 tablet 0  . DULoxetine (CYMBALTA) 60 MG capsule Take 60 mg by mouth daily.    . fluticasone (FLONASE) 50 MCG/ACT nasal spray Place 2 sprays into both nostrils daily. 16 g 1  . irbesartan (AVAPRO) 150 MG tablet Take 0.5 tablets (75 mg total) by mouth daily. Take half tablet daily 15 tablet 2  . NON FORMULARY  CBD OIL.  Take 1 capsule by mouth daily.    Marland Kitchen. omeprazole (PRILOSEC) 40 MG capsule Take 1 capsule (40 mg total) by mouth daily. 30 capsule 3  . ondansetron (ZOFRAN ODT) 4 MG disintegrating tablet Take 1 tablet (4 mg total) by mouth every 8 (eight) hours as needed for nausea or vomiting. 20 tablet 0  . PREBIOTIC PRODUCT PO Take by mouth. Mix in shake and drink once a day.    . Probiotic Product (PROBIOTIC DAILY PO) Take 1 capsule by mouth daily.     No current facility-administered medications on file prior to visit.     Review of Systems:  As per HPI- otherwise negative.   Physical Examination: There were no vitals filed for this visit. There were no vitals filed for this visit. There is no height or weight on file to calculate BMI. Ideal Body Weight:    GEN: WDWN, NAD, Non-toxic, A & O x 3 HEENT: Atraumatic, Normocephalic. Neck supple. No masses, No LAD. Ears and Nose: No external deformity. CV: RRR, No M/G/R. No JVD. No thrill. No extra heart sounds. PULM: CTA B, no wheezes, crackles, rhonchi. No retractions. No resp. distress. No accessory muscle use. ABD: S, NT, ND, +BS. No rebound. No HSM. EXTR: No c/c/e NEURO Normal gait.  PSYCH: Normally interactive. Conversant. Not depressed or anxious appearing.  Calm demeanor.    Assessment and Plan: ***  Signed Abbe AmsterdamJessica Sandara Tyree, MD

## 2018-01-11 DIAGNOSIS — E039 Hypothyroidism, unspecified: Secondary | ICD-10-CM | POA: Diagnosis not present

## 2018-01-11 DIAGNOSIS — M2550 Pain in unspecified joint: Secondary | ICD-10-CM | POA: Diagnosis not present

## 2018-01-11 DIAGNOSIS — A6920 Lyme disease, unspecified: Secondary | ICD-10-CM | POA: Diagnosis not present

## 2018-01-11 DIAGNOSIS — R5383 Other fatigue: Secondary | ICD-10-CM | POA: Diagnosis not present

## 2018-01-12 ENCOUNTER — Ambulatory Visit: Payer: BLUE CROSS/BLUE SHIELD | Admitting: Family Medicine

## 2018-01-15 ENCOUNTER — Other Ambulatory Visit: Payer: Self-pay | Admitting: Family Medicine

## 2018-01-17 ENCOUNTER — Encounter: Payer: Self-pay | Admitting: Family Medicine

## 2018-01-17 ENCOUNTER — Ambulatory Visit (INDEPENDENT_AMBULATORY_CARE_PROVIDER_SITE_OTHER): Payer: BLUE CROSS/BLUE SHIELD | Admitting: Family Medicine

## 2018-01-17 VITALS — BP 124/88 | HR 86 | Resp 16 | Ht 66.0 in | Wt 229.0 lb

## 2018-01-17 DIAGNOSIS — Z5181 Encounter for therapeutic drug level monitoring: Secondary | ICD-10-CM

## 2018-01-17 DIAGNOSIS — E785 Hyperlipidemia, unspecified: Secondary | ICD-10-CM

## 2018-01-17 DIAGNOSIS — Z09 Encounter for follow-up examination after completed treatment for conditions other than malignant neoplasm: Secondary | ICD-10-CM | POA: Diagnosis not present

## 2018-01-17 DIAGNOSIS — I10 Essential (primary) hypertension: Secondary | ICD-10-CM

## 2018-01-17 MED ORDER — IRBESARTAN 150 MG PO TABS: 75.0000 mg | ORAL_TABLET | Freq: Every day | ORAL | 3 refills | Status: DC

## 2018-01-17 MED ORDER — ATENOLOL 25 MG PO TABS: 25.0000 mg | ORAL_TABLET | Freq: Two times a day (BID) | ORAL | 3 refills | Status: DC

## 2018-01-17 NOTE — Telephone Encounter (Signed)
Copied from CRM [phone]. Topic: Quick Communication - Rx Refill/Question >> Jan 17, 2018 11:40 AM Arlyss Gandyichardson, Mekiyah Gladwell N, NT wrote: Medication: irbesartan (AVAPRO) 150 MG tablet  Has the patient contacted their pharmacy? Yes.   (Agent: If no, request that the patient contact the pharmacy for the refill.) (Agent: If yes, when and what did the pharmacy advise?)  Preferred Pharmacy (with phone number or street name): [facility] [id number] - [place], [place] - [address] [phone] (Phone) [phone] (Fax)      Agent: Please be advised that RX refills may take up to 3 business days. We ask that you follow-up with your pharmacy.

## 2018-01-17 NOTE — Progress Notes (Addendum)
[facility] [address], [address] [place], [place] [address] [phone] [phone]  Date:  01/17/2018   Name:  Jenna GessLisa M Arroyo   DOB:  09/10/1965   MRN:  [id number]  PCP:  Pearline Cables[place], [place] Espinoza C, MD    Chief Complaint: Hypertension (seen in ER, high blood pressure, needing medication refilled)   History of Present Illness:  Jenna Arroyo is a 52 y.o. very pleasant female patient who presents with the following:  Following up from ER visit for HTN - she was a no show for her appt last week.  Reports that she was very tired, having a flare of her chronic lyme and "I just forgot."  Seen in the ER on 6/30 for HTN as follows  Patient seen in the ED for high blood pressure.Patient states she forgot to take her BP medication today and when she checked her pressure it was running 130-140/115.Patient called the after hours service and was told to come into the ED.Patient is lying comfortable me in the bed carrying a conversation with family member at the bedside. Patient denies any headache, shortness of breath, or chest pain.Spoke to patient about BP management and following up with her PCP.Patient will follow up with PCP and return to the ED if she experience any of the mentioned symptoms.    Pt reports that she could not find her BP med and this is why she did not take it.   She is taking atenolol 25 BID and irbersartan 150 taking 1/2 daily  BP Readings from Last 3 Encounters:  01/17/18 124/88  01/02/18 (!) 145/88  08/02/17 (!) 129/93   She does check her BP at home- over the last couple of weeks her SBP is 120- 134/ 80- 90s.    Her thyroid is managed by [facility]- they are also treating her for "chronic lyme and mold poisoning" per her report but she is not sure if they are doing her more routine labs for her so will check today   Wt Readings from Last 3 Encounters:  01/17/18 229 lb (103.9 kg)  01/02/18 232 lb (105.2 kg)  08/02/17 232 lb  6.4 oz (105.4 kg)     Patient Active Problem List   Diagnosis Date Noted  . Axillary lymphadenopathy 12/11/2016  . Lyme disease 12/11/2016  . Dyslipidemia 06/18/2016  . Essential hypertension, benign 06/18/2016  . Acquired hypothyroidism 06/18/2016    Past Medical History:  Diagnosis Date  . Thyroid disease     No past surgical history on file.  Social History   Tobacco Use  . Smoking status: Never Smoker  . Smokeless tobacco: Never Used  Substance Use Topics  . Alcohol use: No  . Drug use: No    Family History  Problem Relation Age of Onset  . Hypertension Mother   . Heart disease Father   . Hypertension Sister   . Hypertension Brother     Allergies  Allergen Reactions  . Ceftriaxone Sodium In Dextrose Other (See Comments)    "heart races and cannot breathe"  . Guaifenesin Other (See Comments)    "heart races, dizziness, light-headedness"  . Guaifenesin & Derivatives Other (See Comments)    "heart races, dizziness, light-headedness"  . Meperidine Other (See Comments)    "heart races"  . Other Rash  . Pseudoephedrine Other (See Comments)    "heart races and high blood pressure"  . Rosuvastatin Calcium Nausea And Vomiting    Muscle  aches   . Simvastatin Other (See Comments)    muscle aching   . Ciprofloxacin   . Rocephin [Ceftriaxone] Itching    Medication list has been reviewed and updated.  Current Outpatient Medications on File Prior to Visit  Medication Sig Dispense Refill  . ALPRAZolam (XANAX) 0.5 MG tablet Take 0.5 mg by mouth 3 (three) times daily as needed for anxiety.    Mack Guise THYROID 15 MG tablet Take 2 tablets by mouth daily.  2  . CANNABIDIOL PO Take by mouth. As directed    . cholestyramine (QUESTRAN) 4 GM/DOSE powder BEGIN WITH 1/4 TSP QOD. FOLLOW PROTOCOL  1  . cyclobenzaprine (FLEXERIL) 10 MG tablet Take 1 tablet (10 mg total) by mouth at bedtime as needed for muscle spasms. 21 tablet 0  . DULoxetine (CYMBALTA) 60 MG capsule Take  60 mg by mouth daily.    . fluticasone (FLONASE) 50 MCG/ACT nasal spray Place 2 sprays into both nostrils daily. 16 g 1  . NON FORMULARY CBD OIL.  Take 1 capsule by mouth daily.    Marland Kitchen omeprazole (PRILOSEC) 40 MG capsule Take 1 capsule (40 mg total) by mouth daily. 30 capsule 3  . ondansetron (ZOFRAN ODT) 4 MG disintegrating tablet Take 1 tablet (4 mg total) by mouth every 8 (eight) hours as needed for nausea or vomiting. 20 tablet 0  . PREBIOTIC PRODUCT PO Take by mouth. Mix in shake and drink once a day.    . Probiotic Product (PROBIOTIC DAILY PO) Take 1 capsule by mouth daily.     No current facility-administered medications on file prior to visit.     Review of Systems:  As per HPI- otherwise negative. Reports that she recently had a flare of her chronic lyme which is why she did not come last week- she was "too sick and forgot"   Physical Examination: Vitals:   01/17/18 1506 01/17/18 1519  BP: (!) 122/92 124/88  Pulse: 86   Resp: 16   SpO2: 97%    Vitals:   01/17/18 1506  Weight: 229 lb (103.9 kg)  Height: 5\' 6"  (1.676 m)   Body mass index is 36.96 kg/m. Ideal Body Weight: Weight in (lb) to have BMI = 25: 154.6  GEN: WDWN, NAD, Non-toxic, A & O x 3, obese, looks well  HEENT: Atraumatic, Normocephalic. Neck supple. No masses, No LAD. Ears and Nose: No external deformity. CV: RRR, No M/G/R. No JVD. No thrill. No extra heart sounds. PULM: CTA B, no wheezes, crackles, rhonchi. No retractions. No resp. distress. No accessory muscle use. EXTR: No c/c/e NEURO Normal gait.  PSYCH: Normally interactive. Conversant. Not depressed or anxious appearing.  Calm demeanor.    Assessment and Plan: Essential hypertension, benign - Plan: irbesartan (AVAPRO) 150 MG tablet, atenolol (TENORMIN) 25 MG tablet  Dyslipidemia - Plan: Lipid panel  Medication monitoring encounter - Plan: CBC, Comprehensive metabolic panel  Hospital discharge follow-up  Labs pending as above Refilled her BP  meds- advised her to monitor at home and let me know if consistently higher than 140/90 Needs lipid panel   Signed Abbe Amsterdam, MD  Received her labs 7/16, letter to pt   Results for orders placed or performed in visit on 01/17/18  CBC  Result Value Ref Range   WBC 9.0 4.0 - 10.5 K/uL   RBC 4.55 3.87 - 5.11 Mil/uL   Platelets 371.0 150.0 - 400.0 K/uL   Hemoglobin 13.9 12.0 - 15.0 g/dL   HCT 36.6 44.0 -  46.0 %   MCV 89.0 78.0 - 100.0 fl   MCHC 34.4 30.0 - 36.0 g/dL   RDW 16.1 09.6 - 04.5 %  Comprehensive metabolic panel  Result Value Ref Range   Sodium 137 135 - 145 mEq/L   Potassium 4.2 3.5 - 5.1 mEq/L   Chloride 100 96 - 112 mEq/L   CO2 31 19 - 32 mEq/L   Glucose, Bld 80 70 - 99 mg/dL   BUN 13 6 - 23 mg/dL   Creatinine, Ser 4.09 0.40 - 1.20 mg/dL   Total Bilirubin 0.6 0.2 - 1.2 mg/dL   Alkaline Phosphatase 91 39 - 117 U/L   AST 24 0 - 37 U/L   ALT 50 (H) 0 - 35 U/L   Total Protein 6.6 6.0 - 8.3 g/dL   Albumin 4.1 3.5 - 5.2 g/dL   Calcium 9.4 8.4 - 81.1 mg/dL   GFR 91.47 >82.95 mL/min  Lipid panel  Result Value Ref Range   Cholesterol 280 (H) 0 - 200 mg/dL   Triglycerides 621.3 (H) 0.0 - 149.0 mg/dL   HDL 08.65 >78.46 mg/dL   VLDL 96.2 0.0 - 95.2 mg/dL   LDL Cholesterol 841 (H) 0 - 99 mg/dL   Total CHOL/HDL Ratio 4    NonHDL 215.43

## 2018-01-17 NOTE — Patient Instructions (Signed)
Good to see you today-  I refilled your blood pressure meds today

## 2018-01-18 LAB — CBC
HEMATOCRIT: 40.5 % (ref 36.0–46.0)
Hemoglobin: 13.9 g/dL (ref 12.0–15.0)
MCHC: 34.4 g/dL (ref 30.0–36.0)
MCV: 89 fl (ref 78.0–100.0)
Platelets: 371 10*3/uL (ref 150.0–400.0)
RBC: 4.55 Mil/uL (ref 3.87–5.11)
RDW: 13.8 % (ref 11.5–15.5)
WBC: 9 10*3/uL (ref 4.0–10.5)

## 2018-01-18 LAB — COMPREHENSIVE METABOLIC PANEL
ALT: 50 U/L — ABNORMAL HIGH (ref 0–35)
AST: 24 U/L (ref 0–37)
Albumin: 4.1 g/dL (ref 3.5–5.2)
Alkaline Phosphatase: 91 U/L (ref 39–117)
BUN: 13 mg/dL (ref 6–23)
CHLORIDE: 100 meq/L (ref 96–112)
CO2: 31 mEq/L (ref 19–32)
Calcium: 9.4 mg/dL (ref 8.4–10.5)
Creatinine, Ser: 0.87 mg/dL (ref 0.40–1.20)
GFR: 72.55 mL/min (ref >60.00)
GLUCOSE: 80 mg/dL (ref 70–99)
Potassium: 4.2 mEq/L (ref 3.5–5.1)
SODIUM: 137 meq/L (ref 135–145)
TOTAL PROTEIN: 6.6 g/dL (ref 6.0–8.3)
Total Bilirubin: 0.6 mg/dL (ref 0.2–1.2)

## 2018-01-18 LAB — LIPID PANEL
CHOL/HDL RATIO: 4
Cholesterol: 280 mg/dL — ABNORMAL HIGH (ref 0–200)
HDL: 64.7 mg/dL (ref >39.00)
LDL CALC: 178 mg/dL — AB (ref 0–99)
NonHDL: 215.43
TRIGLYCERIDES: 189 mg/dL — AB (ref 0.0–149.0)
VLDL: 37.8 mg/dL (ref 0.0–40.0)

## 2018-02-01 DIAGNOSIS — Z87891 Personal history of nicotine dependence: Secondary | ICD-10-CM | POA: Diagnosis not present

## 2018-02-01 DIAGNOSIS — F418 Other specified anxiety disorders: Secondary | ICD-10-CM | POA: Diagnosis not present

## 2018-02-01 DIAGNOSIS — R7989 Other specified abnormal findings of blood chemistry: Secondary | ICD-10-CM | POA: Diagnosis not present

## 2018-02-01 DIAGNOSIS — E079 Disorder of thyroid, unspecified: Secondary | ICD-10-CM | POA: Diagnosis not present

## 2018-02-01 DIAGNOSIS — I10 Essential (primary) hypertension: Secondary | ICD-10-CM | POA: Diagnosis not present

## 2018-02-01 DIAGNOSIS — R197 Diarrhea, unspecified: Secondary | ICD-10-CM | POA: Diagnosis not present

## 2018-02-01 DIAGNOSIS — Z79899 Other long term (current) drug therapy: Secondary | ICD-10-CM | POA: Diagnosis not present

## 2018-02-01 DIAGNOSIS — Z888 Allergy status to other drugs, medicaments and biological substances status: Secondary | ICD-10-CM | POA: Diagnosis not present

## 2018-02-02 ENCOUNTER — Telehealth: Payer: Self-pay

## 2018-02-02 NOTE — Telephone Encounter (Signed)
Patietnt called [facility] on 02/01/18 regarding Diarrhea. [facility] where she was treated and released.

## 2018-02-07 DIAGNOSIS — M67449 Ganglion, unspecified hand: Secondary | ICD-10-CM | POA: Diagnosis not present

## 2018-02-07 DIAGNOSIS — R52 Pain, unspecified: Secondary | ICD-10-CM | POA: Diagnosis not present

## 2018-02-17 DIAGNOSIS — Z888 Allergy status to other drugs, medicaments and biological substances status: Secondary | ICD-10-CM | POA: Diagnosis not present

## 2018-02-17 DIAGNOSIS — R112 Nausea with vomiting, unspecified: Secondary | ICD-10-CM | POA: Diagnosis not present

## 2018-02-17 DIAGNOSIS — Z87891 Personal history of nicotine dependence: Secondary | ICD-10-CM | POA: Diagnosis not present

## 2018-02-17 DIAGNOSIS — I10 Essential (primary) hypertension: Secondary | ICD-10-CM | POA: Diagnosis not present

## 2018-02-17 DIAGNOSIS — Z79899 Other long term (current) drug therapy: Secondary | ICD-10-CM | POA: Diagnosis not present

## 2018-02-17 DIAGNOSIS — E079 Disorder of thyroid, unspecified: Secondary | ICD-10-CM | POA: Diagnosis not present

## 2018-02-17 DIAGNOSIS — Z91040 Latex allergy status: Secondary | ICD-10-CM | POA: Diagnosis not present

## 2018-02-17 DIAGNOSIS — Z91048 Other nonmedicinal substance allergy status: Secondary | ICD-10-CM | POA: Diagnosis not present

## 2018-02-17 DIAGNOSIS — Z885 Allergy status to narcotic agent status: Secondary | ICD-10-CM | POA: Diagnosis not present

## 2018-02-17 DIAGNOSIS — Z883 Allergy status to other anti-infective agents status: Secondary | ICD-10-CM | POA: Diagnosis not present

## 2018-02-17 DIAGNOSIS — R51 Headache: Secondary | ICD-10-CM | POA: Diagnosis not present

## 2018-02-21 DIAGNOSIS — R1011 Right upper quadrant pain: Secondary | ICD-10-CM | POA: Diagnosis not present

## 2018-02-21 DIAGNOSIS — K828 Other specified diseases of gallbladder: Secondary | ICD-10-CM | POA: Diagnosis not present

## 2018-03-14 DIAGNOSIS — R1011 Right upper quadrant pain: Secondary | ICD-10-CM | POA: Diagnosis not present

## 2018-03-14 DIAGNOSIS — K828 Other specified diseases of gallbladder: Secondary | ICD-10-CM | POA: Diagnosis not present

## 2018-03-15 ENCOUNTER — Telehealth: Payer: Self-pay | Admitting: *Deleted

## 2018-03-15 NOTE — Progress Notes (Signed)
Healthcare at [facility] [address], [address] [place], [place] [address] [phone]  Date:  03/17/2018   Name:  Jenna Arroyo   DOB:  01-16-1966   MRN:  [id number]  PCP:  Pearline Cables, MD    Chief Complaint: ER follow up (vomiting, nausea, trouble keeping food down)   History of Present Illness:  Jenna Arroyo is a 52 y.o. very pleasant female patient who presents with the following:  Last seen by myself in July to recheck HTN  ER follow-up visit today History of hypothyroidism and HTN  She was in the ER with [facility] about a month ago on 8/15 She was having diarrhea, RUQ pain, vomiting and a nurse told her that she had "gallstones in my stool" per her report.  She reports that her BM contained little green blobs that floated in the toilet bowl and that these were gallstones. She also reports seeing numerous examples of the same online.  She feels like she was able to empty her gallbladder in this way. We spent some time talking about this today and I explained to the pt that due to the small size of the gall ducts don't think she would be able to pass any significant sized stones this way  She did see general surgery on 8/19, and underwent a HIDA scan on Monday and this was normal:  FINDINGS:  Homogeneous distribution of radiopharmaceutical is seen within the liver.  Cystic and common bile ducts appear patent.  Radiopharmaceutical demonstrated normal progression into the gallbladder and upper gastrointestinal tract. Following cholecystokinin administration intravenously, gallbladder ejection fraction was calculated at is 52%.  IMPRESSION: Normal exam. Normal gallbladder ejection fraction. Jenna Arroyo was somewhat confused by what the next step would be. I called and spoke with Dr. Luster Landsberg, the general surgeon who she saw.  He related that although certainly they can remove her gallbladder he thinks that this is less likely to be the cause of her  sx and that he would recommend a GI consulation for likely UGI first.  He also confirmed that gallstones generally cannot be passed in the stool as the pt described above  She notes that she has had GI concerns for several months- approx 4 months  If she does not stick to small amounts of food/ bland food she will have vomiting and RUQ pain She may have intermittent diarrhea as well  Her temp may have run up to 99.5 when she had an attack  She was on omeprazole but has not taken in the last 3 days Zantac does seem to help her more   Wt Readings from Last 3 Encounters:  03/17/18 226 lb (102.5 kg)  01/17/18 229 lb (103.9 kg)  01/02/18 232 lb (105.2 kg)    Lab Results  Component Value Date   TSH 4.116 08/25/2014     Patient Active Problem List   Diagnosis Date Noted  . Axillary lymphadenopathy 12/11/2016  . Lyme disease 12/11/2016  . Dyslipidemia 06/18/2016  . Essential hypertension, benign 06/18/2016  . Acquired hypothyroidism 06/18/2016    Past Medical History:  Diagnosis Date  . Thyroid disease     No past surgical history on file.  Social History   Tobacco Use  . Smoking status: Never Smoker  . Smokeless tobacco: Never Used  Substance Use Topics  . Alcohol use: No  . Drug use: No    Family History  Problem Relation Age of Onset  .  Hypertension Mother   . Heart disease Father   . Hypertension Sister   . Hypertension Brother     Allergies  Allergen Reactions  . Ceftriaxone Sodium In Dextrose Other (See Comments)    "heart races and cannot breathe"  . Guaifenesin Other (See Comments)    "heart races, dizziness, light-headedness"  . Guaifenesin & Derivatives Other (See Comments)    "heart races, dizziness, light-headedness"  . Meperidine Other (See Comments)    "heart races"  . Other Rash  . Penicillins Hives  . Pseudoephedrine Other (See Comments)    "heart races and high blood pressure"  . Rosuvastatin Calcium Nausea And Vomiting    Muscle aches    . Simvastatin Other (See Comments)    muscle aching   . Ciprofloxacin   . Rocephin [Ceftriaxone] Itching    Medication list has been reviewed and updated.  Current Outpatient Medications on File Prior to Visit  Medication Sig Dispense Refill  . ALPRAZolam (XANAX) 0.5 MG tablet Take 0.5 mg by mouth 3 (three) times daily as needed for anxiety.    Mack Guise THYROID 15 MG tablet Take 2 tablets by mouth daily.  2  . atenolol (TENORMIN) 25 MG tablet Take 1 tablet (25 mg total) by mouth 2 (two) times daily. 180 tablet 3  . CANNABIDIOL PO Take by mouth. As directed    . cholestyramine (QUESTRAN) 4 GM/DOSE powder BEGIN WITH 1/4 TSP QOD. FOLLOW PROTOCOL  1  . cyclobenzaprine (FLEXERIL) 10 MG tablet Take 1 tablet (10 mg total) by mouth at bedtime as needed for muscle spasms. 21 tablet 0  . DULoxetine (CYMBALTA) 60 MG capsule Take 60 mg by mouth daily.    . fluticasone (FLONASE) 50 MCG/ACT nasal spray Place 2 sprays into both nostrils daily. 16 g 1  . irbesartan (AVAPRO) 150 MG tablet Take 0.5 tablets (75 mg total) by mouth daily. 45 tablet 3  . NON FORMULARY CBD OIL.  Take 1 capsule by mouth daily.    Marland Kitchen omeprazole (PRILOSEC) 40 MG capsule Take 1 capsule (40 mg total) by mouth daily. 30 capsule 3  . ondansetron (ZOFRAN ODT) 4 MG disintegrating tablet Take 1 tablet (4 mg total) by mouth every 8 (eight) hours as needed for nausea or vomiting. 20 tablet 0  . PREBIOTIC PRODUCT PO Take by mouth. Mix in shake and drink once a day.    . Probiotic Product (PROBIOTIC DAILY PO) Take 1 capsule by mouth daily.     No current facility-administered medications on file prior to visit.     Review of Systems:  As per HPI- otherwise negative.   Physical Examination: Vitals:   03/17/18 1107  BP: 132/82  Pulse: 96  Resp: 16  Temp: 97.9 F (36.6 C)  SpO2: 98%   Vitals:   03/17/18 1107  Weight: 226 lb (102.5 kg)  Height: 5\' 6"  (1.676 m)   Body mass index is 36.48 kg/m. Ideal Body Weight: Weight in  (lb) to have BMI = 25: 154.6  GEN: WDWN, NAD, Non-toxic, A & O x 3, looks well, obese  HEENT: Atraumatic, Normocephalic. Neck supple. No masses, No LAD. Ears and Nose: No external deformity. CV: RRR, No M/G/R. No JVD. No thrill. No extra heart sounds. PULM: CTA B, no wheezes, crackles, rhonchi. No retractions. No resp. distress. No accessory muscle use. ABD: S, ND, +BS. No rebound. No HSM.  Epigastric and RUQ tenderness is present  EXTR: No c/c/e NEURO Normal gait.  PSYCH: Normally interactive. Conversant. Not  depressed or anxious appearing.  Calm demeanor.    Assessment and Plan: Epigastric pain - Plan: sucralfate (CARAFATE) 1 g tablet, Ambulatory referral to Gastroenterology  Here today with episodic epigastric pain. Sometimes associated with vomiting and diarrhea Although her sx do suggest gallbladder pathology her HIDA scan was negative We will refer her to GI for a consultation and possibly an UGI if appropriate In the meantime she will continue to use zantac and also gave her an rx for carafate to use for 10 days- suspect that she may actually have gastritis   Signed Abbe Amsterdam, MD

## 2018-03-15 NOTE — Telephone Encounter (Signed)
Copied from CRM [id number]. Topic: General - Other >> Mar 15, 2018  3:13 PM Elliot Gault wrote: Relation to pt: self  Call back number: [phone]   Reason for call:  Patient was seen in the ED and decided to follow up with general surgeon Rodena Goldmann, MD General Surgery [address] [place], [place] [address] [phone] . Patient requested specialist to fax over office notes to PCP  at [phone]. Patient scheduled ED follow up for 03/17/18 with PCP.

## 2018-03-16 NOTE — Telephone Encounter (Signed)
Received Office and ED notes from [facility]; forwarded to provider/SLS 09/11

## 2018-03-16 NOTE — Telephone Encounter (Signed)
Noted, thank you

## 2018-03-17 ENCOUNTER — Encounter: Payer: Self-pay | Admitting: Family Medicine

## 2018-03-17 ENCOUNTER — Ambulatory Visit: Payer: BLUE CROSS/BLUE SHIELD | Admitting: Family Medicine

## 2018-03-17 VITALS — BP 132/82 | HR 96 | Temp 97.9°F | Resp 16 | Ht 66.0 in | Wt 226.0 lb

## 2018-03-17 DIAGNOSIS — R1013 Epigastric pain: Secondary | ICD-10-CM

## 2018-03-17 MED ORDER — SUCRALFATE 1 G PO TABS: 1.0000 g | ORAL_TABLET | Freq: Three times a day (TID) | ORAL | 0 refills | Status: DC

## 2018-03-17 NOTE — Patient Instructions (Signed)
I am going to set you up to see GI for a consultation-  they may want to do an upper GI for you to look at your stomach and small intestine. If this is also normal we might consider taking your gallbladder out  I gave you an rx for carafate to use- with meals and at bedtime- for about 10 days.  This may help to soothe your stomach Continue your zantac as needed

## 2018-03-21 ENCOUNTER — Encounter: Payer: Self-pay | Admitting: Gastroenterology

## 2018-03-21 ENCOUNTER — Ambulatory Visit (INDEPENDENT_AMBULATORY_CARE_PROVIDER_SITE_OTHER): Payer: BLUE CROSS/BLUE SHIELD | Admitting: Gastroenterology

## 2018-03-21 VITALS — BP 128/76 | HR 68 | Ht 66.5 in | Wt 224.0 lb

## 2018-03-21 DIAGNOSIS — Z1211 Encounter for screening for malignant neoplasm of colon: Secondary | ICD-10-CM

## 2018-03-21 DIAGNOSIS — R740 Nonspecific elevation of levels of transaminase and lactic acid dehydrogenase [LDH]: Secondary | ICD-10-CM

## 2018-03-21 DIAGNOSIS — R1011 Right upper quadrant pain: Secondary | ICD-10-CM

## 2018-03-21 DIAGNOSIS — R7401 Elevation of levels of liver transaminase levels: Secondary | ICD-10-CM

## 2018-03-21 NOTE — Patient Instructions (Addendum)
If you are age 52 or older, your body mass index should be between 23-30. Your Body mass index is 35.61 kg/m. If this is out of the aforementioned range listed, please consider follow up with your Primary Care Provider.  If you are age 52 or younger, your body mass index should be between 19-25. Your Body mass index is 35.61 kg/m. If this is out of the aformentioned range listed, please consider follow up with your Primary Care Provider.   You have been scheduled for an endoscopy. Please follow written instructions given to you at your visit today. If you use inhalers (even only as needed), please bring them with you on the day of your procedure. Your physician has requested that you go to www.startemmi.com and enter the access code given to you at your visit today. This web site gives a general overview about your procedure. However, you should still follow specific instructions given to you by our office regarding your preparation for the procedure.  It was a pleasure to see you today!  Vito Cirigliano, D.O.

## 2018-03-21 NOTE — Progress Notes (Signed)
Chief Complaint: Abdominal pain   Referring Provider:     Abbe Amsterdam, MD    HPI:     Jenna Arroyo is a 52 y.o. female referred to the Gastroenterology Clinic for evaluation of RUQ pain.   She presented to the [facility] ER on 02/17/18 with diarrhea, nausea, vomiting and upper abdominal pain.  She was treated with IVFs and sxs eventually resolved. No associated GIB. Diarrhea has stopped, but still with nausea and vomiting and intermittent RUQ pain. No hematemesis. Does report a MEG "gnawing" discomfort.  She was evaluated by Dr. Luster Landsberg in General Surgery on 02/21/2018 with subsequent normal HIDA with EF 52% and referred to GI for further evaluation.  She was additionally seen by Dr. Patsy Lager on 03/17/2018 and prescribed Carafate to use in addition to her Zantac and referred to GI.  She reports no change despite taking medications as prescribed.  Previous evaluation includes: RUQ ultrasound in August 2018 with mildly increased hepatic echotexture, no stones and no duct dilation.  Previous RUQ ultrasound in June 2018 with questionable gallbladder sludge without stones, normal CBD and similar increased hepatic echotexture.  Recent evaluation notable for mildly elevated ALT (50) with otherwise normal LAE's and normal CBC.  She was previously evaluated by Dr. Loman [facility]) in December 2017 for MEG pain that have been ongoing for years along with reflux.  CBC and CMP normal in 2017, negative H. pylori breath test in 2017 and 2018.  Normal RUQ ultrasound in 2016.  Was recommended for EGD along with colonoscopy for routine CRC screening, but these were not completed at that time.  Had colonoscopy 20 years ago for hematochezia- reportedly normal study, but admits that she cannot recall results. No recurrence of hematochezia.   Past Medical History:  Diagnosis Date  . Thyroid disease      Past Surgical History:  Procedure Laterality Date  . ABDOMINAL EXPLORATION  SURGERY    . APPENDECTOMY    . MASTOIDECTOMY    . TONSILECTOMY/ADENOIDECTOMY WITH MYRINGOTOMY     Family History  Problem Relation Age of Onset  . Hypertension Mother   . Heart disease Father   . Hypertension Sister   . Hypertension Brother   . Colon cancer Neg Hx    Social History   Tobacco Use  . Smoking status: Never Smoker  . Smokeless tobacco: Never Used  Substance Use Topics  . Alcohol use: No  . Drug use: No   Current Outpatient Medications  Medication Sig Dispense Refill  . ALPRAZolam (XANAX) 0.5 MG tablet Take 0.5 mg by mouth 3 (three) times daily as needed for anxiety.    Mack Guise THYROID 15 MG tablet Take 2 tablets by mouth daily.  2  . atenolol (TENORMIN) 25 MG tablet Take 1 tablet (25 mg total) by mouth 2 (two) times daily. 180 tablet 3  . cholestyramine (QUESTRAN) 4 GM/DOSE powder BEGIN WITH 1/4 TSP QOD. FOLLOW PROTOCOL  1  . DULoxetine (CYMBALTA) 60 MG capsule Take 60 mg by mouth daily.    . fluticasone (FLONASE) 50 MCG/ACT nasal spray Place 2 sprays into both nostrils daily. 16 g 1  . irbesartan (AVAPRO) 150 MG tablet Take 0.5 tablets (75 mg total) by mouth daily. 45 tablet 3  . PREBIOTIC PRODUCT PO Take by mouth. Mix in shake and drink once a day.    . Probiotic Product (PROBIOTIC DAILY PO) Take 1 capsule by mouth daily.  No current facility-administered medications for this visit.    Allergies  Allergen Reactions  . Ceftriaxone Sodium In Dextrose Other (See Comments)    "heart races and cannot breathe"  . Guaifenesin Other (See Comments)    "heart races, dizziness, light-headedness"  . Guaifenesin & Derivatives Other (See Comments)    "heart races, dizziness, light-headedness"  . Meperidine Other (See Comments)    "heart races"  . Other Rash  . Penicillins Hives  . Pseudoephedrine Other (See Comments)    "heart races and high blood pressure"  . Rosuvastatin Calcium Nausea And Vomiting    Muscle aches   . Simvastatin Other (See Comments)     muscle aching   . Ciprofloxacin   . Rocephin [Ceftriaxone] Itching     Review of Systems: All systems reviewed and negative except where noted in HPI.     Physical Exam:    Wt Readings from Last 3 Encounters:  03/21/18 224 lb (101.6 kg)  03/17/18 226 lb (102.5 kg)  01/17/18 229 lb (103.9 kg)    BP 128/76   Pulse 68   Ht 5' 6.5" (1.689 m)   Wt 224 lb (101.6 kg)   BMI 35.61 kg/m  Constitutional:  Pleasant, in no acute distress. Psychiatric: Normal mood and affect. Behavior is normal. EENT: Pupils normal.  Conjunctivae are normal. No scleral icterus. Neck supple. No cervical LAD. Cardiovascular: Normal rate, regular rhythm. No edema Pulmonary/chest: Effort normal and breath sounds normal. No wheezing, rales or rhonchi. Abdominal: Soft, nondistended, nontender. Bowel sounds active throughout. There are no masses palpable. No hepatomegaly. Neurological: Alert and oriented to person place and time. Skin: Skin is warm and dry. No rashes noted.   ASSESSMENT AND PLAN;   Jenna Arroyo is a 52 y.o. female presenting with:  1) RUQ Abdominal pain: Recent evaluation demonstrates normal CMP and normal HIDA.  Agree with surgical recommendation for EGD to evaluate for mucosal/luminal etiology for presenting symptoms prior to entertaining elective ccy for possible biliary dyskinesia. - EGD with bx - Can stop Zantac and Carafate given no clinical response - RTC after EGD  2) CRC screening: - Offered colonoscopy, but would like to hold off until resolution of acute issues. Will review again at f/u appt  3) Elevated ALT: Mildly elevated ALT with otherwise normal liver enzymes and preserved hepatic synthetic function.  Given body habitus, comorbidities, previous ultrasound with increased hepatic echotexture, all consistent with NAFLD.  Discussed diagnosis at length, to include risk of progression to NASH or cirrhosis, and will proceed as below:  - Given the presumed diagnosis of fatty  liver disease, I extensively counseled the patient on the importance of diet and exercise, with a modest weight loss of 10% of total body weight, done slowly over weeks to months - Repeat LAEs in 6 months - RUQ in 2018 x2 with increased echotextrure c/w fatty liver.  No repeat imaging needed at this time - We will hold off on additional serologic studies at this time pending improvement with diet/exercise as above.   The indications, risks, and benefits of EGD were explained to the patient in detail. Risks include but are not limited to bleeding, perforation, adverse reaction to medications, and cardiopulmonary compromise. Sequelae include but are not limited to the possibility of surgery, hositalization, and mortality. The patient verbalized understanding and wished to proceed. All questions answered, referred to scheduler. Further recommendations pending results of the exam.     Shellia Cleverly, DO, FACG  03/21/2018, 9:54 AM  [place], Gwenlyn FoundJessica C, MD

## 2018-03-29 ENCOUNTER — Encounter: Payer: Self-pay | Admitting: Gastroenterology

## 2018-03-29 ENCOUNTER — Ambulatory Visit (AMBULATORY_SURGERY_CENTER): Payer: BLUE CROSS/BLUE SHIELD | Admitting: Gastroenterology

## 2018-03-29 VITALS — BP 133/72 | HR 70 | Temp 98.0°F | Resp 14 | Ht 66.5 in | Wt 224.0 lb

## 2018-03-29 DIAGNOSIS — Z1211 Encounter for screening for malignant neoplasm of colon: Secondary | ICD-10-CM

## 2018-03-29 DIAGNOSIS — K2970 Gastritis, unspecified, without bleeding: Secondary | ICD-10-CM | POA: Diagnosis not present

## 2018-03-29 DIAGNOSIS — K449 Diaphragmatic hernia without obstruction or gangrene: Secondary | ICD-10-CM

## 2018-03-29 DIAGNOSIS — K2980 Duodenitis without bleeding: Secondary | ICD-10-CM | POA: Diagnosis not present

## 2018-03-29 DIAGNOSIS — K317 Polyp of stomach and duodenum: Secondary | ICD-10-CM

## 2018-03-29 DIAGNOSIS — R1011 Right upper quadrant pain: Secondary | ICD-10-CM

## 2018-03-29 DIAGNOSIS — K209 Esophagitis, unspecified without bleeding: Secondary | ICD-10-CM

## 2018-03-29 DIAGNOSIS — K2990 Gastroduodenitis, unspecified, without bleeding: Secondary | ICD-10-CM

## 2018-03-29 DIAGNOSIS — R109 Unspecified abdominal pain: Secondary | ICD-10-CM | POA: Diagnosis not present

## 2018-03-29 DIAGNOSIS — K3189 Other diseases of stomach and duodenum: Secondary | ICD-10-CM | POA: Diagnosis not present

## 2018-03-29 MED ORDER — SODIUM CHLORIDE 0.9 % IV SOLN: 500.0000 mL | Freq: Once | INTRAVENOUS | Status: DC

## 2018-03-29 MED ORDER — PANTOPRAZOLE SODIUM 40 MG PO TBEC: 40.0000 mg | DELAYED_RELEASE_TABLET | Freq: Two times a day (BID) | ORAL | 0 refills | Status: DC

## 2018-03-29 NOTE — Progress Notes (Signed)
Called to room to assist during endoscopic procedure.  Patient ID and intended procedure confirmed with present staff. Received instructions for my participation in the procedure from the performing physician.  

## 2018-03-29 NOTE — Patient Instructions (Signed)
YOU HAD AN ENDOSCOPIC PROCEDURE TODAY AT THE [facility]:   Refer to the procedure report that was given to you for any specific questions about what was found during the examination.  If the procedure report does not answer your questions, please call your gastroenterologist to clarify.  If you requested that your care partner not be given the details of your procedure findings, then the procedure report has been included in a sealed envelope for you to review at your convenience later.  YOU SHOULD EXPECT: Some feelings of bloating in the abdomen. Passage of more gas than usual.  Walking can help get rid of the air that was put into your GI tract during the procedure and reduce the bloating. If you had a lower endoscopy (such as a colonoscopy or flexible sigmoidoscopy) you may notice spotting of blood in your stool or on the toilet paper. If you underwent a bowel prep for your procedure, you may not have a normal bowel movement for a few days.  Please Note:  You might notice some irritation and congestion in your nose or some drainage.  This is from the oxygen used during your procedure.  There is no need for concern and it should clear up in a day or so.  SYMPTOMS TO REPORT IMMEDIATELY:   Following upper endoscopy (EGD)  Vomiting of blood or coffee ground material  New chest pain or pain under the shoulder blades  Painful or persistently difficult swallowing  New shortness of breath  Fever of 100F or higher  Black, tarry-looking stools  For urgent or emergent issues, a gastroenterologist can be reached at any hour by calling [phone].   DIET:  We do recommend a small meal at first, but then you may proceed to your regular diet.  Drink plenty of fluids but you should avoid alcoholic beverages for 24 hours.  ACTIVITY:  You should plan to take it easy for the rest of today and you should NOT DRIVE or use heavy machinery until tomorrow (because of the sedation medicines used  during the test).    FOLLOW UP: Our staff will call the number listed on your records the next business day following your procedure to check on you and address any questions or concerns that you may have regarding the information given to you following your procedure. If we do not reach you, we will leave a message.  However, if you are feeling well and you are not experiencing any problems, there is no need to return our call.  We will assume that you have returned to your regular daily activities without incident.  If any biopsies were taken you will be contacted by phone or by letter within the next 1-3 weeks.  Please call us at [phone] if you have not heard about the biopsies in 3 weeks.    SIGNATURES/CONFIDENTIALITY: You and/or your care partner have signed paperwork which will be entered into your electronic medical record.  These signatures attest to the fact that that the information above on your After Visit Summary has been reviewed and is understood.  Full responsibility of the confidentiality of this discharge information lies with you and/or your care-partner. 

## 2018-03-29 NOTE — Op Note (Signed)
[facility] Patient Name: Jenna EvesLisa Arroyo Procedure Date: 03/29/2018 9:41 AM MRN: [id number] Endoscopist: Doristine LocksVito Kinney Sackmann , MD Age: [age] Referring MD:  Date of Birth: 07/24/1965 Gender: Female Account #: [id number] Procedure:                Upper GI endoscopy Indications:              Epigastric abdominal pain, Abdominal pain in the                            right upper quadrant, Nausea with vomiting Medicines:                Monitored Anesthesia Care Procedure:                Pre-Anesthesia Assessment:                           - Prior to the procedure, a History and Physical                            was performed, and patient medications and                            allergies were reviewed. The patient's tolerance of                            previous anesthesia was also reviewed. The risks                            and benefits of the procedure and the sedation                            options and risks were discussed with the patient.                            All questions were answered, and informed consent                            was obtained. Prior Anticoagulants: The patient has                            taken no previous anticoagulant or antiplatelet                            agents. ASA Grade Assessment: II - A patient with                            mild systemic disease. After reviewing the risks                            and benefits, the patient was deemed in                            satisfactory condition to undergo the procedure.  After obtaining informed consent, the endoscope was                            passed under direct vision. Throughout the                            procedure, the patient's blood pressure, pulse, and                            oxygen saturations were monitored continuously. The                            Endoscope was introduced through the mouth, and                            advanced to the  third part of duodenum. The upper                            GI endoscopy was accomplished without difficulty.                            The patient tolerated the procedure well. Scope In: Scope Out: Findings:                 LA Grade A (one or more mucosal breaks less than 5                            mm, not extending between tops of 2 mucosal folds)                            esophagitis with no bleeding was found 35 cm from                            the incisors.                           The upper third of the esophagus and middle third                            of the esophagus were normal.                           The gastroesophageal flap valve was visualized                            endoscopically and classified as Hill Grade III                            (minimal fold, loose to endoscope, hiatal hernia                            likely). An axial hernia was not appreciated on  anterograde views.                           Localized mild inflammation characterized by                            erythema was found in the gastric antrum. Biopsies                            were taken with a cold forceps for Helicobacter                            pylori testing. Estimated blood loss was minimal.                           Multiple 2 to 4 mm sessile polyps with no bleeding                            and no stigmata of recent bleeding were found in                            the gastric fundus. Several of these polyps were                            removed with a cold biopsy forceps for histologic                            representative evaluation. Resection and retrieval                            were complete. Estimated blood loss was minimal.                           The gastric body and pylorus were normal.                           Patchy mildly erythematous mucosa without active                            bleeding and with no stigmata of  bleeding was found                            in the duodenal bulb, in the first portion of the                            duodenum and in the second portion of the duodenum.                            Biopsies were taken with a cold forceps for                            histology. Estimated blood loss was minimal.  The third portion of the duodenum was normal.                            Biopsies for histology were taken with a cold                            forceps for evaluation of celiac disease. Estimated                            blood loss was minimal. Complications:            No immediate complications. Estimated Blood Loss:     Estimated blood loss was minimal. Impression:               - LA Grade A esophagitis.                           - Normal upper third of esophagus and middle third                            of esophagus.                           - Gastroesophageal flap valve classified as Hill                            Grade III (minimal fold, loose to endoscope, hiatal                            hernia likely).                           - Gastritis. Biopsied.                           - Multiple gastric polyps. Resected and retrieved.                           - Normal gastric body and pylorus.                           - Erythematous duodenopathy. Biopsied.                           - Normal third portion of the duodenum. Biopsied. Recommendation:           - Patient has a contact number available for                            emergencies. The signs and symptoms of potential                            delayed complications were discussed with the                            patient. Return to normal activities tomorrow.  Written discharge instructions were provided to the                            patient.                           - Resume previous diet today.                           - Continue present  medications.                           - Await pathology results.                           - Use Protonix (pantoprazole) 40 mg PO BID for 8                            weeks, then reduce to 40 mg daily and will continue                            to titrate to the lowest effective dose.                           - Return to GI clinic at appointment to be                            scheduled. Doristine Locks, MD 03/29/2018 10:03:06 AM

## 2018-03-29 NOTE — Progress Notes (Signed)
To PACU, VSS. Report to [email or web address].tb 

## 2018-03-30 ENCOUNTER — Telehealth: Payer: Self-pay

## 2018-03-30 NOTE — Telephone Encounter (Signed)
  Follow up Call-  Call back number 03/29/2018  Post procedure Call Back phone  # [phone]  Permission to leave phone message Yes  Some recent data might be hidden     Patient questions:  Do you have a fever, pain , or abdominal swelling? No. Pain Score  0 *  Have you tolerated food without any problems? Yes.    Have you been able to return to your normal activities? Yes.    Do you have any questions about your discharge instructions: Diet   No. Medications  No. Follow up visit  No.  Do you have questions or concerns about your Care? No.  Actions: * If pain score is 4 or above: No action needed, pain <4.

## 2018-04-01 ENCOUNTER — Ambulatory Visit: Payer: Self-pay | Admitting: *Deleted

## 2018-04-01 NOTE — Telephone Encounter (Signed)
Pt calling stating that when she took her BP last night at work she noted that it was 150/138-140 in her left arm and 138/100 in the right arm. Pt states at that time she felt flushed and had a headache. Pt states she has not taken her BP today and is currently at work. Pt states she has been taking Avapro daily and has not missed any doses. Pt states her BP usually runs 120-125/80 and does experience spikes in her BP frequently. Pt states she is unable to take time off next week and does not get off until 6 pm. Pt scheduled for appt 04/11/18 and advised if symptoms become worse to return call to the office or to seek care in the ED. Pt verbalized understanding.  Reason for Disposition . [1] Systolic BP  >= 130 OR Diastolic >= 80 AND [2] taking BP medications  Answer Assessment - Initial Assessment Questions 1. BLOOD PRESSURE: "What is the blood pressure?" "Did you take at least two measurements 5 minutes apart?"     150/138-140 in left arm and 138/100 in right arm 2. ONSET: "When did you take your blood pressure?"     Last night 3. HOW: "How did you obtain the blood pressure?" (e.g., visiting nurse, automatic home BP monitor)     Automatic home BP 4. HISTORY: "Do you have a history of high blood pressure?"     yes 5. MEDICATIONS: "Are you taking any medications for blood pressure?" "Have you missed any doses recently?"     Avapro and has not missed any doses 6. OTHER SYMPTOMS: "Do you have any symptoms?" (e.g., headache, chest pain, blurred vision, difficulty breathing, weakness)     Headache and very tired on yesterday but not like last night, pt states headache is still present 7. PREGNANCY: "Is there any chance you are pregnant?" "When was your last menstrual period?"     No menstrual cycles for years  Protocols used: HIGH BLOOD PRESSURE-A-AH

## 2018-04-05 ENCOUNTER — Ambulatory Visit: Payer: Self-pay

## 2018-04-05 ENCOUNTER — Encounter: Payer: Self-pay | Admitting: Gastroenterology

## 2018-04-05 NOTE — Telephone Encounter (Signed)
Patient called in with c/o "leg pain." She says "my left leg started hurting 1 week ago. I think it may be a blood clot. I was told to take a baby aspirin." I asked where is the pain, she says "the pain is to my left lower leg to the back side right above my calf and below my knee cap. I walk with a limp." I asked about other symptoms, she says , swelling, bruise, a tiny lump and warmth."  According to protocol, see PCP within 24 hours, no availability with PCP, appointment scheduled for tomorrow at 1515 with Dr. Carmelia Roller, care advice given, patient verbalized understanding.  Reason for Disposition . Localized pain, redness or hard lump along vein  Answer Assessment - Initial Assessment Questions 1. ONSET: "When did the pain start?"      1 week ago 2. LOCATION: "Where is the pain located?"      Left leg on the back side right below knee cap above calf 3. PAIN: "How bad is the pain?"    (Scale 1-10; or mild, moderate, severe)   -  MILD (1-3): doesn't interfere with normal activities    -  MODERATE (4-7): interferes with normal activities (e.g., work or school) or awakens from sleep, limping    -  SEVERE (8-10): excruciating pain, unable to do any normal activities, unable to walk     Moderate 4. WORK OR EXERCISE: "Has there been any recent work or exercise that involved this part of the body?"      No 5. CAUSE: "What do you think is causing the leg pain?"     I don't think 6. OTHER SYMPTOMS: "Do you have any other symptoms?" (e.g., chest pain, back pain, breathing difficulty, swelling, rash, fever, numbness, weakness)     Bruising, swelling, lump is felt 7. PREGNANCY: "Is there any chance you are pregnant?" "When was your last menstrual period?"     No  Protocols used: LEG PAIN-A-AH

## 2018-04-06 ENCOUNTER — Ambulatory Visit: Payer: BLUE CROSS/BLUE SHIELD | Admitting: Family Medicine

## 2018-04-06 DIAGNOSIS — Z0289 Encounter for other administrative examinations: Secondary | ICD-10-CM

## 2018-04-08 ENCOUNTER — Telehealth: Payer: Self-pay | Admitting: Gastroenterology

## 2018-04-08 NOTE — Telephone Encounter (Signed)
Patient called for her results, let her know to expect a letter in the mail. I did read her the result letter. Patient asked if she was tested for celiac?

## 2018-04-08 NOTE — Progress Notes (Signed)
[facility] [address], [address] [place], [place] [address] [phone]  Date:  04/11/2018   Name:  Jenna Arroyo   DOB:  1966-02-24   MRN:  [id number]  PCP:  Pearline Cables, MD    Chief Complaint: Hypertension (has been running high, headache, 140/102 at home machine); Bleeding/Bruising (bruise for several months on left calf, feels like there is a small knot in leg); and Flu Vaccine (refused flu shot)   History of Present Illness:  [name] is a 52 y.o. very pleasant female patient who presents with the following:  Here with concern of HTN History of HTN, dyslipidemia, hypothyroidism  Lab Results  Component Value Date   TSH 4.116 08/25/2014   Seen by myself last month with concern of persistent epigastric/ RUQ pain. She has seen GI and recently had an upper GI scope which did show esophagitis.  Her discomfort has gotten better since which is great news  She is behind on several screening services-   BP Readings from Last 3 Encounters:  04/11/18 124/90  03/29/18 133/72  03/21/18 128/76   Mammo: I will schedule for her  Colon: never had, she is not interested in discussing screning at this time  Pap: last done 15 years ago.  She does not wish to do this today but might consider doing it later on. She Flu: declines  She is taking armour thyroid, atenolol 25 BID, cymbalta, avapro 75 daily  She is checking her BP at home- she has gotten some headaches and has been rushing around, under a lot a stress She has a houseguest as well right now Her home BP may run 148/110- she would say this is her average BP   She also has noted a bruise on her posterior left calf with a palpable "knot" in this area for a month or so.  No history of DVT.   It seems to be getting a bit larger.  We will set up for an [place] to rule out DVT She also has a large bruise on her left thigh from a dog jumping on her -she was surprised that she  got this bruise   Pulse Readings from Last 3 Encounters:  04/11/18 85  03/29/18 70  03/21/18 68    Patient Active Problem List   Diagnosis Date Noted  . Axillary lymphadenopathy 12/11/2016  . Lyme disease 12/11/2016  . Dyslipidemia 06/18/2016  . Essential hypertension, benign 06/18/2016  . Acquired hypothyroidism 06/18/2016    Past Medical History:  Diagnosis Date  . Lyme disease   . Thyroid disease     Past Surgical History:  Procedure Laterality Date  . ABDOMINAL EXPLORATION SURGERY    . APPENDECTOMY    . MASTOIDECTOMY    . TONSILECTOMY/ADENOIDECTOMY WITH MYRINGOTOMY      Social History   Tobacco Use  . Smoking status: Never Smoker  . Smokeless tobacco: Never Used  Substance Use Topics  . Alcohol use: No  . Drug use: No    Family History  Problem Relation Age of Onset  . Hypertension Mother   . Heart disease Father   . Hypertension Sister   . Hypertension Brother   . Colon cancer Neg Hx     Allergies  Allergen Reactions  . Ceftriaxone Sodium In Dextrose Other (See Comments)    "heart races and cannot breathe"  . Guaifenesin Other (See Comments)    "heart races, dizziness, light-headedness"  .  Guaifenesin & Derivatives Other (See Comments)    "heart races, dizziness, light-headedness"  . Meperidine Other (See Comments)    "heart races"  . Other Rash  . Penicillins Hives  . Pseudoephedrine Other (See Comments)    "heart races and high blood pressure"  . Rosuvastatin Calcium Nausea And Vomiting    Muscle aches   . Simvastatin Other (See Comments)    muscle aching   . Ciprofloxacin   . Rocephin [Ceftriaxone] Itching    Medication list has been reviewed and updated.  Current Outpatient Medications on File Prior to Visit  Medication Sig Dispense Refill  . ALPRAZolam (XANAX) 0.5 MG tablet Take 0.5 mg by mouth 3 (three) times daily as needed for anxiety.    Mack Guise THYROID 15 MG tablet Take 2 tablets by mouth daily.  2  . atenolol (TENORMIN) 25  MG tablet Take 1 tablet (25 mg total) by mouth 2 (two) times daily. 180 tablet 3  . cholestyramine (QUESTRAN) 4 GM/DOSE powder BEGIN WITH 1/4 TSP QOD. FOLLOW PROTOCOL  1  . DULoxetine (CYMBALTA) 60 MG capsule Take 60 mg by mouth daily.    Marland Kitchen ELDERBERRY PO Take by mouth.    . fluticasone (FLONASE) 50 MCG/ACT nasal spray Place 2 sprays into both nostrils daily. 16 g 1  . irbesartan (AVAPRO) 150 MG tablet Take 0.5 tablets (75 mg total) by mouth daily. 45 tablet 3  . pantoprazole (PROTONIX) 40 MG tablet Take 1 tablet (40 mg total) by mouth 2 (two) times daily. Take Protonix 40 mg BID for 8 weeks,take this medication 30 mins to 1 hour prior to morning and evening meals. Then decrease to once daily until return office appointment. 150 tablet 0  . PREBIOTIC PRODUCT PO Take by mouth. Mix in shake and drink once a day.    . Probiotic Product (PROBIOTIC DAILY PO) Take 1 capsule by mouth daily.     No current facility-administered medications on file prior to visit.     Review of Systems:  As per HPI- otherwise negative. No fever or chills    Physical Examination: Vitals:   04/11/18 0839  BP: 124/90  Pulse: 85  Resp: 16  Temp: 98 F (36.7 C)  SpO2: 97%   Vitals:   04/11/18 0839  Weight: 223 lb (101.2 kg)  Height: 5' 6.5" (1.689 m)   Body mass index is 35.45 kg/m. Ideal Body Weight: Weight in (lb) to have BMI = 25: 156.9  GEN: WDWN, NAD, Non-toxic, A & O x 3, obese, looks well  HEENT: Atraumatic, Normocephalic. Neck supple. No masses, No LAD.  Left TM wnl, oropharynx normal.  PEERL,EOMI.  Cerumen on right  Ears and Nose: No external deformity. CV: RRR, No M/G/R. No JVD. No thrill. No extra heart sounds. PULM: CTA B, no wheezes, crackles, rhonchi. No retractions. No resp. distress. No accessory muscle use. ABD: S, NT, ND, +BS. No rebound. No HSM. EXTR: No c/c/e NEURO Normal gait.  PSYCH: Normally interactive. Conversant. Not depressed or anxious appearing.  Calm demeanor.  She has a  small bruise on her left anterior thigh c/w trauma she described.  I reassured her that this does not seem to be unusual for the trauma that she describes  On the posterior left calf she has minor varicose veins and a small nodule which I think is likely a cyst but we will certainly rule out a clot    Assessment and Plan: Screening for breast cancer - Plan: MM 3D SCREEN BREAST  BILATERAL  Pain of left calf - Plan: US Venous Img Lower Unilateral Left, CANCELED: VAS [place] LOWER EXTREMITY VENOUS (DVT)  Essential hypertension, benign  Here today to discuss a few concerns Her BP has apparently been higher at home and she is concerned. Will have her double up her irbesartan but she will watch for any hypotension Ordered mammo and an [place] of her left leg to be done now, Will plan further follow- up pending labs. Encouraged her to have a pap. Also need to remind about colon cancer screening with results   She will see me in 6 months   Signed Abbe Amsterdam, MD  Received her ultrasound report, negative for any DVT   Icalled pt and let her know   US Venous Img Lower Unilateral Left  Result Date: 04/11/2018 CLINICAL DATA:  Left lower extremity pain and edema. Evaluate for DVT. EXAM: LEFT LOWER EXTREMITY VENOUS DOPPLER ULTRASOUND TECHNIQUE: Gray-scale sonography with graded compression, as well as color Doppler and duplex ultrasound were performed to evaluate the lower extremity deep venous systems from the level of the common femoral vein and including the common femoral, femoral, profunda femoral, popliteal and calf veins including the posterior tibial, peroneal and gastrocnemius veins when visible. The superficial great saphenous vein was also interrogated. Spectral Doppler was utilized to evaluate flow at rest and with distal augmentation maneuvers in the common femoral, femoral and popliteal veins. COMPARISON:  None. FINDINGS: Contralateral Common Femoral Vein: Respiratory phasicity is normal and  symmetric with the symptomatic side. No evidence of thrombus. Normal compressibility. Common Femoral Vein: No evidence of thrombus. Normal compressibility, respiratory phasicity and response to augmentation. Saphenofemoral Junction: No evidence of thrombus. Normal compressibility and flow on color Doppler imaging. Profunda Femoral Vein: No evidence of thrombus. Normal compressibility and flow on color Doppler imaging. Femoral Vein: No evidence of thrombus. Normal compressibility, respiratory phasicity and response to augmentation. Popliteal Vein: No evidence of thrombus. Normal compressibility, respiratory phasicity and response to augmentation. Calf Veins: No evidence of thrombus. Normal compressibility and flow on color Doppler imaging. Superficial Great Saphenous Vein: No evidence of thrombus. Normal compressibility. Venous Reflux:  None. Other Findings:  None. IMPRESSION: No evidence of DVT within the left lower extremity. Electronically Signed   By: Simonne Come M.D.   On: 04/11/2018 10:18

## 2018-04-11 ENCOUNTER — Encounter (HOSPITAL_BASED_OUTPATIENT_CLINIC_OR_DEPARTMENT_OTHER): Payer: Self-pay

## 2018-04-11 ENCOUNTER — Encounter: Payer: Self-pay | Admitting: Family Medicine

## 2018-04-11 ENCOUNTER — Ambulatory Visit (HOSPITAL_BASED_OUTPATIENT_CLINIC_OR_DEPARTMENT_OTHER)
Admission: RE | Admit: 2018-04-11 | Discharge: 2018-04-11 | Disposition: A | Discharge status: Home / Self Care | Payer: BLUE CROSS/BLUE SHIELD | Source: Ambulatory Visit | Attending: Family Medicine | Admitting: Family Medicine

## 2018-04-11 ENCOUNTER — Ambulatory Visit: Payer: BLUE CROSS/BLUE SHIELD | Admitting: Family Medicine

## 2018-04-11 VITALS — BP 124/90 | HR 85 | Temp 98.0°F | Resp 16 | Ht 66.5 in | Wt 223.0 lb

## 2018-04-11 DIAGNOSIS — Z1239 Encounter for other screening for malignant neoplasm of breast: Secondary | ICD-10-CM | POA: Diagnosis not present

## 2018-04-11 DIAGNOSIS — M79662 Pain in left lower leg: Secondary | ICD-10-CM | POA: Insufficient documentation

## 2018-04-11 DIAGNOSIS — I10 Essential (primary) hypertension: Secondary | ICD-10-CM

## 2018-04-11 DIAGNOSIS — Z1231 Encounter for screening mammogram for malignant neoplasm of breast: Secondary | ICD-10-CM | POA: Diagnosis not present

## 2018-04-11 DIAGNOSIS — R600 Localized edema: Secondary | ICD-10-CM | POA: Diagnosis not present

## 2018-04-11 NOTE — Telephone Encounter (Signed)
Left detailed message for patient that Dr. Barron Alvine did test for Celiac, which was negative.

## 2018-04-11 NOTE — Patient Instructions (Addendum)
I have ordered an ultrasound of your left calf and also a mammogram for you- you can go and have these tests done now   I would encourage you to have a pap smear asap!  We are glad to do this at your convenience.   This is a very routine service that we provide to maintain your health!  For your blood pressure, try taking 75 mg of irbesartan twice a day instead of once.  Let me know if your BP does not fall into the 120- 135/ 75- 85 range on this regimen

## 2018-04-12 ENCOUNTER — Other Ambulatory Visit: Payer: Self-pay | Admitting: Family Medicine

## 2018-04-12 DIAGNOSIS — R928 Other abnormal and inconclusive findings on diagnostic imaging of breast: Secondary | ICD-10-CM

## 2018-04-14 ENCOUNTER — Ambulatory Visit: Payer: Self-pay

## 2018-04-14 ENCOUNTER — Ambulatory Visit
Admission: RE | Admit: 2018-04-14 | Discharge: 2018-04-14 | Disposition: A | Discharge status: Home / Self Care | Payer: BLUE CROSS/BLUE SHIELD | Source: Ambulatory Visit | Attending: Family Medicine | Admitting: Family Medicine

## 2018-04-14 ENCOUNTER — Telehealth: Payer: Self-pay | Admitting: *Deleted

## 2018-04-14 DIAGNOSIS — R928 Other abnormal and inconclusive findings on diagnostic imaging of breast: Secondary | ICD-10-CM | POA: Diagnosis not present

## 2018-04-14 NOTE — Telephone Encounter (Signed)
Received Physician Orders from [facility]; forwarded to provider/SLS 10/10

## 2018-04-19 ENCOUNTER — Encounter: Payer: Self-pay | Admitting: Family Medicine

## 2018-04-22 DIAGNOSIS — F988 Other specified behavioral and emotional disorders with onset usually occurring in childhood and adolescence: Secondary | ICD-10-CM | POA: Insufficient documentation

## 2018-04-22 DIAGNOSIS — F411 Generalized anxiety disorder: Secondary | ICD-10-CM | POA: Insufficient documentation

## 2018-05-02 ENCOUNTER — Ambulatory Visit: Payer: Self-pay | Admitting: Psychiatry

## 2018-05-18 ENCOUNTER — Telehealth: Payer: Self-pay | Admitting: Family Medicine

## 2018-05-18 DIAGNOSIS — R0683 Snoring: Secondary | ICD-10-CM

## 2018-05-18 NOTE — Telephone Encounter (Signed)
Copied from CRM [id number]. Topic: Referral - Request for Referral >> May 18, 2018  1:56 PM Jaquita Rectoravis, Karen A wrote: Has patient seen PCP for this complaint? No. *If NO, is insurance requiring patient see PCP for this issue before PCP can refer them? Referral for which specialty: Sleep apnea testing Preferred provider/office: Dr Warner MccreedyJessica Copeland Reason for referral: Snoring, and exhaustion

## 2018-05-18 NOTE — Telephone Encounter (Signed)
Called pt and let her know that I will certainly place referral to neurology for sleep study for her

## 2018-05-19 ENCOUNTER — Other Ambulatory Visit: Payer: Self-pay | Admitting: Family Medicine

## 2018-05-19 DIAGNOSIS — I10 Essential (primary) hypertension: Secondary | ICD-10-CM

## 2018-05-19 NOTE — Telephone Encounter (Signed)
Copied from CRM [id number]. Topic: Quick Communication - Rx Refill/Question >> May 19, 2018 12:13 PM Laural BenesJohnson, Louisianahiquita C wrote: Medication: irbesartan (AVAPRO) 150 MG tablet   Has the patient contacted their pharmacy? Yes  (Agent: If no, request that the patient contact the pharmacy for the refill.) (Agent: If yes, when and what did the pharmacy advise?)  Preferred Pharmacy (with phone number or street name): [id number] - [place],  - [address] [phone] (Phone) [phone] (Fax)    Agent: Please be advised that RX refills may take up to 3 business days. We ask that you follow-up with your pharmacy.

## 2018-05-23 ENCOUNTER — Ambulatory Visit: Payer: BLUE CROSS/BLUE SHIELD | Admitting: Family Medicine

## 2018-05-23 ENCOUNTER — Telehealth: Payer: Self-pay | Admitting: Family Medicine

## 2018-05-23 ENCOUNTER — Encounter: Payer: Self-pay | Admitting: Family Medicine

## 2018-05-23 DIAGNOSIS — I10 Essential (primary) hypertension: Secondary | ICD-10-CM

## 2018-05-23 DIAGNOSIS — B349 Viral infection, unspecified: Secondary | ICD-10-CM | POA: Diagnosis not present

## 2018-05-23 NOTE — Progress Notes (Signed)
Jenna Arroyo - 52 y.o. female MRN [id number]  Date of birth: 09/23/1965  Subjective Chief Complaint  Patient presents with  . Diarrhea    HPI Jenna Arroyo is a 52 y.o. female with reported history of Lyme disease, mold toxicity and hashimoto disease here today with complaint of diarrhea, nausea, body aches, intermittent chills, sinus pressure and congestion.  She reports that symptoms started this morning.  She has had no fever.  She thinks that her symptoms may be from chemicals in her city drinking water.  Reports that she typically drinks bottled water however was out and used city water this morning.  She reports boiling before drinking but water still had a soapy, chemical taste.  She has not taken anything for treatment so far, she reports that she does not do well with decongestants and expectorants.  ROS:  A comprehensive ROS was completed and negative except as noted per HPI    Allergies  Allergen Reactions  . Ceftriaxone Sodium In Dextrose Other (See Comments)    "heart races and cannot breathe"  . Guaifenesin Other (See Comments)    "heart races, dizziness, light-headedness"  . Guaifenesin & Derivatives Other (See Comments)    "heart races, dizziness, light-headedness"  . Meperidine Other (See Comments)    "heart races"  . Other Rash  . Penicillins Hives  . Pseudoephedrine Other (See Comments)    "heart races and high blood pressure"  . Rosuvastatin Calcium Nausea And Vomiting    Muscle aches   . Simvastatin Other (See Comments)    muscle aching   . Ciprofloxacin   . Rocephin [Ceftriaxone] Itching    Past Medical History:  Diagnosis Date  . Lyme disease   . Thyroid disease     Past Surgical History:  Procedure Laterality Date  . ABDOMINAL EXPLORATION SURGERY    . APPENDECTOMY    . MASTOIDECTOMY    . TONSILECTOMY/ADENOIDECTOMY WITH MYRINGOTOMY      Social History   Socioeconomic History  . Marital status: Divorced    Spouse name: Not on file  . Number  of children: Not on file  . Years of education: Not on file  . Highest education level: Not on file  Occupational History  . Not on file  Social Needs  . Financial resource strain: Not on file  . Food insecurity:    Worry: Not on file    Inability: Not on file  . Transportation needs:    Medical: Not on file    Non-medical: Not on file  Tobacco Use  . Smoking status: Never Smoker  . Smokeless tobacco: Never Used  Substance and Sexual Activity  . Alcohol use: No  . Drug use: No  . Sexual activity: Never  Lifestyle  . Physical activity:    Days per week: Not on file    Minutes per session: Not on file  . Stress: Not on file  Relationships  . Social connections:    Talks on phone: Not on file    Gets together: Not on file    Attends religious service: Not on file    Active member of club or organization: Not on file    Attends meetings of clubs or organizations: Not on file    Relationship status: Not on file  Other Topics Concern  . Not on file  Social History Narrative  . Not on file    Family History  Problem Relation Age of Onset  . Hypertension Mother   .  Heart disease Father   . Hypertension Sister   . Hypertension Brother   . Colon cancer Neg Hx     Health Maintenance  Topic Date Due  . HIV Screening  08/12/1980  . TETANUS/TDAP  08/12/1984  . PAP SMEAR  08/12/1986  . COLONOSCOPY  08/13/2015  . INFLUENZA VACCINE  04/12/2019 (Originally 02/03/2018)  . MAMMOGRAM  04/11/2020    ----------------------------------------------------------------------------------------------------------------------------------------------------------------------------------------------------------------- Physical Exam BP 120/82   Pulse 67   Temp 97.6 F (36.4 C)   Wt 218 lb 6.4 oz (99.1 kg)   SpO2 99%   BMI 34.72 kg/m   Physical Exam  Constitutional: She is oriented to person, place, and time. She appears well-nourished. No distress.  HENT:  Head: Normocephalic and  atraumatic.  Mouth/Throat: Oropharynx is clear and moist.  Eyes: No scleral icterus.  Neck: Neck supple. No thyromegaly present.  Cardiovascular: Normal rate, regular rhythm and normal heart sounds.  Pulmonary/Chest: Effort normal and breath sounds normal.  Abdominal: Soft.  Hyperactive bowel sounds  Lymphadenopathy:    She has no cervical adenopathy.  Neurological: She is alert and oriented to person, place, and time.  Skin: Skin is warm and dry. No rash noted.  Psychiatric: She has a normal mood and affect. Her behavior is normal.    ------------------------------------------------------------------------------------------------------------------------------------------------------------------------------------------------------------------- Assessment and Plan  Viral syndrome -Symptoms consistent with viral etiology.  -Recommend supportive treatment -Declines zofran for nausea. -Push fluids, remain well hydrated.  -Call if symptoms are not improving.

## 2018-05-23 NOTE — Patient Instructions (Signed)
Viral Illness, Adult Viruses are tiny germs that can get into a person's body and cause illness. There are many different types of viruses, and they cause many types of illness. Viral illnesses can range from mild to severe. They can affect various parts of the body. Common illnesses that are caused by a virus include colds and the flu. Viral illnesses also include serious conditions such as HIV/AIDS (human immunodeficiency virus/acquired immunodeficiency syndrome). A few viruses have been linked to certain cancers. What are the causes? Many types of viruses can cause illness. Viruses invade cells in your body, multiply, and cause the infected cells to malfunction or die. When the cell dies, it releases more of the virus. When this happens, you develop symptoms of the illness, and the virus continues to spread to other cells. If the virus takes over the function of the cell, it can cause the cell to divide and grow out of control, as is the case when a virus causes cancer. Different viruses get into the body in different ways. You can get a virus by:  Swallowing food or water that is contaminated with the virus.  Breathing in droplets that have been coughed or sneezed into the air by an infected person.  Touching a surface that has been contaminated with the virus and then touching your eyes, nose, or mouth.  Being bitten by an insect or animal that carries the virus.  Having sexual contact with a person who is infected with the virus.  Being exposed to blood or fluids that contain the virus, either through an open cut or during a transfusion.  If a virus enters your body, your body's defense system (immune system) will try to fight the virus. You may be at higher risk for a viral illness if your immune system is weak. What are the signs or symptoms? Symptoms vary depending on the type of virus and the location of the cells that it invades. Common symptoms of the main types of viral illnesses  include: Cold and flu viruses  Fever.  Headache.  Sore throat.  Muscle aches.  Nasal congestion.  Cough. Digestive system (gastrointestinal) viruses  Fever.  Abdominal pain.  Nausea.  Diarrhea. Liver viruses (hepatitis)  Loss of appetite.  Tiredness.  Yellowing of the skin (jaundice). Brain and spinal cord viruses  Fever.  Headache.  Stiff neck.  Nausea and vomiting.  Confusion or sleepiness. Skin viruses  Warts.  Itching.  Rash. Sexually transmitted viruses  Discharge.  Swelling.  Redness.  Rash. How is this treated? Viruses can be difficult to treat because they live within cells. Antibiotic medicines do not treat viruses because these drugs do not get inside cells. Treatment for a viral illness may include:  Resting and drinking plenty of fluids.  Medicines to relieve symptoms. These can include over-the-counter medicine for pain and fever, medicines for cough or congestion, and medicines to relieve diarrhea.  Antiviral medicines. These drugs are available only for certain types of viruses. They may help reduce flu symptoms if taken early. There are also many antiviral medicines for hepatitis and HIV/AIDS.  Some viral illnesses can be prevented with vaccinations. A common example is the flu shot. Follow these instructions at home: Medicines   Take over-the-counter and prescription medicines only as told by your health care provider.  If you were prescribed an antiviral medicine, take it as told by your health care provider. Do not stop taking the medicine even if you start to feel better.  Be aware   of when antibiotics are needed and when they are not needed. Antibiotics do not treat viruses. If your health care provider thinks that you may have a bacterial infection as well as a viral infection, you may get an antibiotic. ? Do not ask for an antibiotic prescription if you have been diagnosed with a viral illness. That will not make your  illness go away faster. ? Frequently taking antibiotics when they are not needed can lead to antibiotic resistance. When this develops, the medicine no longer works against the bacteria that it normally fights. General instructions  Drink enough fluids to keep your urine clear or pale yellow.  Rest as much as possible.  Return to your normal activities as told by your health care provider. Ask your health care provider what activities are safe for you.  Keep all follow-up visits as told by your health care provider. This is important. How is this prevented? Take these actions to reduce your risk of viral infection:  Eat a healthy diet and get enough rest.  Wash your hands often with soap and water. This is especially important when you are in public places. If soap and water are not available, use hand sanitizer.  Avoid close contact with friends and family who have a viral illness.  If you travel to areas where viral gastrointestinal infection is common, avoid drinking water or eating raw food.  Keep your immunizations up to date. Get a flu shot every year as told by your health care provider.  Do not share toothbrushes, nail clippers, razors, or needles with other people.  Always practice safe sex.  Contact a health care provider if:  You have symptoms of a viral illness that do not go away.  Your symptoms come back after going away.  Your symptoms get worse. Get help right away if:  You have trouble breathing.  You have a severe headache or a stiff neck.  You have severe vomiting or abdominal pain. This information is not intended to replace advice given to you by your health care provider. Make sure you discuss any questions you have with your health care provider. Document Released: 11/01/2015 Document Revised: 12/04/2015 Document Reviewed: 11/01/2015 Elsevier Interactive Patient Education  2018 Elsevier Inc.  

## 2018-05-23 NOTE — Assessment & Plan Note (Signed)
-  Symptoms consistent with viral etiology.  -Recommend supportive treatment -Declines zofran for nausea. -Push fluids, remain well hydrated.  -Call if symptoms are not improving.

## 2018-05-23 NOTE — Telephone Encounter (Signed)
Copied from CRM [id number]. Topic: Quick Communication - Rx Refill/Question >> May 23, 2018  3:45 PM Jens SomMedley, Jennifer A wrote: Medication: irbesartan (AVAPRO) 150 MG tablet [[id number]][[id number]]  Has the patient contacted their pharmacy? Yes  (Agent: If no, request that the patient contact the pharmacy for the refill.) (Agent: If yes, when and what did the pharmacy advise?) [id number] - [place], [place] - [address] [place] [phone] Phone: [phone] Fax: [phone]   Preferred Pharmacy (with phone number or street name):   Agent: Please be advised that RX refills may take up to 3 business days. We ask that you follow-up with your pharmacy.

## 2018-05-24 ENCOUNTER — Telehealth: Payer: Self-pay

## 2018-05-24 MED ORDER — IRBESARTAN 150 MG PO TABS: 150.0000 mg | ORAL_TABLET | Freq: Every day | ORAL | 3 refills | Status: DC

## 2018-05-24 MED ORDER — IRBESARTAN 150 MG PO TABS: 75.0000 mg | ORAL_TABLET | Freq: Every day | ORAL | 3 refills | Status: DC

## 2018-05-24 NOTE — Addendum Note (Signed)
Addended by: Abbe AmsterdamOPLAND, Rendy Lazard C on: 05/24/2018 01:31 PM   Modules accepted: Orders

## 2018-05-24 NOTE — Telephone Encounter (Signed)
My mistake, we increased to 150 mg DAILY Clarified this rx with pharmacy and left new message for pt

## 2018-05-24 NOTE — Telephone Encounter (Addendum)
Caller Name:  Delice Bisonara  Relation to pt: Pharmacist  Call back number: [phone]  [id number] - [place], [place] - [address] [phone])     Reason for call:  Pharmacy in need of clarity regarding irbesartan (AVAPRO) 150 MG tablet patient states PCP increase frequency to 2x times daily and the Rx sent over today states Sig: Take 0.5 tablets (75 mg total) by mouth daily, please advise

## 2018-05-24 NOTE — Telephone Encounter (Signed)
We did increase her dose to 150 BID at last visit Called pt and LMOM on her machine to make sure 150 BID is working ok for her  Updated this rx with pharmacy

## 2018-05-24 NOTE — Telephone Encounter (Signed)
Pt is on the waitlist for a sleep consult. I called and offered her an appt tomorrow with Dr. Frances FurbishAthar but pt cannot take off of work tomorrow. Will try again another time.

## 2018-05-24 NOTE — Telephone Encounter (Signed)
Refill has been sent.  °

## 2018-06-07 ENCOUNTER — Other Ambulatory Visit: Payer: Self-pay | Admitting: Family Medicine

## 2018-06-07 ENCOUNTER — Encounter: Payer: Self-pay | Admitting: Internal Medicine

## 2018-06-07 ENCOUNTER — Ambulatory Visit: Payer: BLUE CROSS/BLUE SHIELD | Admitting: Internal Medicine

## 2018-06-07 VITALS — BP 128/64 | HR 64 | Temp 98.4°F | Resp 16 | Ht 66.5 in | Wt 219.4 lb

## 2018-06-07 DIAGNOSIS — B349 Viral infection, unspecified: Secondary | ICD-10-CM | POA: Diagnosis not present

## 2018-06-07 LAB — POCT RAPID STREP A (OFFICE): Rapid Strep A Screen: NEGATIVE

## 2018-06-07 MED ORDER — PROMETHAZINE HCL 12.5 MG PO TABS: 12.5000 mg | ORAL_TABLET | Freq: Three times a day (TID) | ORAL | 0 refills | Status: DC | PRN

## 2018-06-07 MED ORDER — ARMOUR THYROID 15 MG PO TABS: 30.0000 mg | ORAL_TABLET | Freq: Every day | ORAL | 1 refills | Status: DC

## 2018-06-07 MED ORDER — ONDANSETRON HCL 4 MG PO TABS: 4.0000 mg | ORAL_TABLET | Freq: Three times a day (TID) | ORAL | 0 refills | Status: DC | PRN

## 2018-06-07 NOTE — Progress Notes (Signed)
Pre visit review using our clinic review tool, if applicable. No additional management support is needed unless otherwise documented below in the visit note. 

## 2018-06-07 NOTE — Patient Instructions (Addendum)
Rest, fluids , tylenol  For cough:  Take Dextromethorphan OTC , follow the instructions from the box   If nausea: Take promethazine  as needed.  For nasal congestion: Use OTC Nasocort or Flonase : 2 nasal sprays on each side of the nose in the morning until you feel better     Call if not gradually better over the next  4-5 days  Call anytime if the symptoms are severe

## 2018-06-07 NOTE — Progress Notes (Signed)
Subjective:    Patient ID: Jenna Arroyo, female    DOB: 10/21/1965, 52 y.o.   MRN: [id number]  DOS:  06/07/2018 Type of visit - description : Acute visit Symptoms started to 3 days ago: Cough, some yellow sputum, feeling tired and lethargic. She had some headaches, mostly behind the eyes, headache is better. 2 days ago developed sore throat. Yesterday she had several episodes of nausea and vomiting, but is resolved today. I asked about sick contacts, she works at a [employer] and has been in contact with multiple people some of them sick.   Review of Systems  + Subjective fever. No diarrhea, no abdominal pain.  Past Medical History:  Diagnosis Date  . Lyme disease   . Thyroid disease     Past Surgical History:  Procedure Laterality Date  . ABDOMINAL EXPLORATION SURGERY    . APPENDECTOMY    . MASTOIDECTOMY    . TONSILECTOMY/ADENOIDECTOMY WITH MYRINGOTOMY      Social History   Socioeconomic History  . Marital status: Divorced    Spouse name: Not on file  . Number of children: Not on file  . Years of education: Not on file  . Highest education level: Not on file  Occupational History  . Not on file  Social Needs  . Financial resource strain: Not on file  . Food insecurity:    Worry: Not on file    Inability: Not on file  . Transportation needs:    Medical: Not on file    Non-medical: Not on file  Tobacco Use  . Smoking status: Never Smoker  . Smokeless tobacco: Never Used  Substance and Sexual Activity  . Alcohol use: No  . Drug use: No  . Sexual activity: Never  Lifestyle  . Physical activity:    Days per week: Not on file    Minutes per session: Not on file  . Stress: Not on file  Relationships  . Social connections:    Talks on phone: Not on file    Gets together: Not on file    Attends religious service: Not on file    Active member of club or organization: Not on file    Attends meetings of clubs or organizations: Not on file    Relationship  status: Not on file  . Intimate partner violence:    Fear of current or ex partner: Not on file    Emotionally abused: Not on file    Physically abused: Not on file    Forced sexual activity: Not on file  Other Topics Concern  . Not on file  Social History Narrative  . Not on file      Allergies as of 06/07/2018      Reactions   Ceftriaxone Sodium In Dextrose Other (See Comments)   "heart races and cannot breathe"   Guaifenesin & Derivatives Other (See Comments)   "heart races, dizziness, light-headedness"   Meperidine Other (See Comments)   "heart races"   Other Rash   Penicillins Hives   Pseudoephedrine Other (See Comments)   "heart races and high blood pressure"   Rosuvastatin Calcium Nausea And Vomiting   Muscle aches    Simvastatin Other (See Comments)   muscle aching    Ciprofloxacin    Rocephin [ceftriaxone] Itching      Medication List        Accurate as of 06/07/18  1:51 PM. Always use your most recent med list.  ALPRAZolam 0.5 MG tablet Commonly known as:  XANAX Take 0.5 mg by mouth daily. Take 5 (2.5 mg) a day   ARMOUR THYROID 15 MG tablet Generic drug:  thyroid Take 2 tablets by mouth daily.   atenolol 25 MG tablet Commonly known as:  TENORMIN Take 1 tablet (25 mg total) by mouth 2 (two) times daily.   cholestyramine 4 GM/DOSE powder Commonly known as:  QUESTRAN BEGIN WITH 1/4 TSP QOD. FOLLOW PROTOCOL   DULoxetine 60 MG capsule Commonly known as:  CYMBALTA Take 60 mg by mouth daily.   ELDERBERRY PO Take by mouth.   fluticasone 50 MCG/ACT nasal spray Commonly known as:  FLONASE Place 2 sprays into both nostrils daily.   hydrOXYzine 25 MG tablet Commonly known as:  ATARAX/VISTARIL Take 25 mg by mouth 3 (three) times daily as needed.   irbesartan 150 MG tablet Commonly known as:  AVAPRO Take 1 tablet (150 mg total) by mouth daily.   pantoprazole 40 MG tablet Commonly known as:  PROTONIX Take 1 tablet (40 mg total) by mouth 2  (two) times daily. Take Protonix 40 mg BID for 8 weeks,take this medication 30 mins to 1 hour prior to morning and evening meals. Then decrease to once daily until return office appointment.   PREBIOTIC PRODUCT PO Take by mouth. Mix in shake and drink once a day.   PROBIOTIC DAILY PO Take 1 capsule by mouth daily.           Objective:   Physical Exam BP 128/64 (BP Location: Left Arm, Patient Position: Sitting, Cuff Size: Normal)   Pulse 64   Temp 98.4 F (36.9 C) (Oral)   Resp 16   Ht 5' 6.5" (1.689 m)   Wt 219 lb 6 oz (99.5 kg)   SpO2 96%   BMI 34.88 kg/m  General:   Well developed, NAD, BMI noted.  HEENT:  Normocephalic . Face symmetric, atraumatic.  TMs are slightly bulged but no red.  Nose is slightly congested.  Throat symmetric, mildly red, no discharge. Lungs:  Frequent cough noted but lungs are CTA B Normal respiratory effort, no intercostal retractions, no accessory muscle use. Heart: RRR,  no murmur.  no pretibial edema bilaterally  Abdomen:  Not distended, soft, mild tenderness diffusely.  No mass or rebound. Skin: Not pale. Not jaundice Neurologic:  alert & oriented X3.  Speech normal, gait appropriate for age and unassisted Psych--  Cognition and judgment appear intact.  Cooperative with normal attention span and concentration.  Behavior appropriate. No anxious or depressed appearing.     Assessment & Plan:     52 year old female, PMH includes HTN, thyroid disease, Lyme disease, presents for the following:  Viral syndrome: Strep test negative, will send a throat culture.  Will treat as a viral syndrome with supportive care.  See AVS Addendum: Can't take guaifenesin, recommend dextromethorphan. Zofran does not work well for her, will cancel that prescription and I sent promethazine.

## 2018-06-09 LAB — CULTURE, GROUP A STREP
MICRO NUMBER:: 91445798
SPECIMEN QUALITY: ADEQUATE

## 2018-06-14 ENCOUNTER — Ambulatory Visit (INDEPENDENT_AMBULATORY_CARE_PROVIDER_SITE_OTHER): Payer: BLUE CROSS/BLUE SHIELD

## 2018-06-14 ENCOUNTER — Ambulatory Visit: Payer: Self-pay | Admitting: *Deleted

## 2018-06-14 ENCOUNTER — Ambulatory Visit (INDEPENDENT_AMBULATORY_CARE_PROVIDER_SITE_OTHER): Payer: BLUE CROSS/BLUE SHIELD | Admitting: Nurse Practitioner

## 2018-06-14 ENCOUNTER — Encounter: Payer: Self-pay | Admitting: Nurse Practitioner

## 2018-06-14 VITALS — BP 134/86 | HR 73 | Temp 98.7°F | Ht 66.5 in | Wt 219.8 lb

## 2018-06-14 DIAGNOSIS — J209 Acute bronchitis, unspecified: Secondary | ICD-10-CM | POA: Diagnosis not present

## 2018-06-14 DIAGNOSIS — R0982 Postnasal drip: Secondary | ICD-10-CM

## 2018-06-14 DIAGNOSIS — R05 Cough: Secondary | ICD-10-CM | POA: Diagnosis not present

## 2018-06-14 MED ORDER — CETIRIZINE HCL 10 MG PO TABS: 10.0000 mg | ORAL_TABLET | Freq: Every day | ORAL | 0 refills | Status: DC

## 2018-06-14 MED ORDER — ALBUTEROL SULFATE HFA 108 (90 BASE) MCG/ACT IN AERS: 1.0000 | INHALATION_SPRAY | Freq: Four times a day (QID) | RESPIRATORY_TRACT | 0 refills | Status: DC | PRN

## 2018-06-14 MED ORDER — HYDROCODONE-HOMATROPINE 5-1.5 MG/5ML PO SYRP: 5.0000 mL | ORAL_SOLUTION | Freq: Three times a day (TID) | ORAL | 0 refills | Status: DC | PRN

## 2018-06-14 NOTE — Progress Notes (Signed)
Subjective:  Patient ID: Jenna Arroyo, female    DOB: Oct 08, 1965  Age: 52 y.o. MRN: [id number]  CC: Cough (pt is coughing yellow/green mucus, vomit,hear bubles behind ears/ cant take mucinex and chronic lyme disease. note for work. )   Cough  This is a new problem. The current episode started more than 1 month ago. The problem has been waxing and waning. The problem occurs constantly. The cough is productive of sputum. Associated symptoms include chest pain, nasal congestion, postnasal drip, rhinorrhea, shortness of breath and wheezing. Pertinent negatives include no chills or fever. The symptoms are aggravated by lying down and cold air. She has tried OTC cough suppressant for the symptoms. The treatment provided no relief. Her past medical history is significant for environmental allergies.  she does not want to use oral prednisone due to previous intolerance (elevated BP and agitation)  Reviewed past Medical, Social and Family history today.  Outpatient Medications Prior to Visit  Medication Sig Dispense Refill  . ALPRAZolam (XANAX) 0.5 MG tablet Take 0.5 mg by mouth daily. Take 5 (2.5 mg) a day    . ARMOUR THYROID 15 MG tablet Take 2 tablets (30 mg total) by mouth daily. 180 tablet 1  . atenolol (TENORMIN) 25 MG tablet Take 1 tablet (25 mg total) by mouth 2 (two) times daily. 180 tablet 3  . cholestyramine (QUESTRAN) 4 GM/DOSE powder BEGIN WITH 1/4 TSP QOD. FOLLOW PROTOCOL  1  . DULoxetine (CYMBALTA) 60 MG capsule Take 60 mg by mouth daily.    Marland Kitchen ELDERBERRY PO Take by mouth.    . irbesartan (AVAPRO) 150 MG tablet Take 1 tablet (150 mg total) by mouth daily. 90 tablet 3  . PREBIOTIC PRODUCT PO Take by mouth. Mix in shake and drink once a day.    . Probiotic Product (PROBIOTIC DAILY PO) Take 1 capsule by mouth daily.    . promethazine (PHENERGAN) 12.5 MG tablet Take 1 tablet (12.5 mg total) by mouth every 8 (eight) hours as needed for nausea or vomiting. 12 tablet 0  . fluticasone (FLONASE)  50 MCG/ACT nasal spray Place 2 sprays into both nostrils daily. (Patient not taking: Reported on 06/14/2018) 16 g 1  . hydrOXYzine (ATARAX/VISTARIL) 25 MG tablet Take 25 mg by mouth 3 (three) times daily as needed.    . pantoprazole (PROTONIX) 40 MG tablet Take 1 tablet (40 mg total) by mouth 2 (two) times daily. Take Protonix 40 mg BID for 8 weeks,take this medication 30 mins to 1 hour prior to morning and evening meals. Then decrease to once daily until return office appointment. (Patient not taking: Reported on 06/07/2018) 150 tablet 0   No facility-administered medications prior to visit.     ROS See HPI  Objective:  BP 134/86   Pulse 73   Temp 98.7 F (37.1 C) (Oral)   Ht 5' 6.5" (1.689 m)   Wt 219 lb 12.8 oz (99.7 kg)   SpO2 99%   BMI 34.95 kg/m   BP Readings from Last 3 Encounters:  06/14/18 134/86  06/07/18 128/64  05/23/18 120/82    Wt Readings from Last 3 Encounters:  06/14/18 219 lb 12.8 oz (99.7 kg)  06/07/18 219 lb 6 oz (99.5 kg)  05/23/18 218 lb 6.4 oz (99.1 kg)    Physical Exam  Constitutional: She is oriented to person, place, and time.  HENT:  Right Ear: Tympanic membrane, external ear and ear canal normal.  Left Ear: Tympanic membrane, external ear and ear canal  normal.  Nose: Mucosal edema and rhinorrhea present. Right sinus exhibits no maxillary sinus tenderness and no frontal sinus tenderness. Left sinus exhibits no maxillary sinus tenderness and no frontal sinus tenderness.  Mouth/Throat: Uvula is midline. No trismus in the jaw. Posterior oropharyngeal erythema present. No oropharyngeal exudate.  Eyes: No scleral icterus.  Neck: Normal range of motion. Neck supple.  Cardiovascular: Normal rate, regular rhythm and normal heart sounds.  Pulmonary/Chest: Effort normal and breath sounds normal.  Musculoskeletal: She exhibits no edema.  Lymphadenopathy:    She has no cervical adenopathy.  Neurological: She is alert and oriented to person, place, and  time.  Vitals reviewed.   Lab Results  Component Value Date   WBC 9.0 01/17/2018   HGB 13.9 01/17/2018   HCT 40.5 01/17/2018   PLT 371.0 01/17/2018   GLUCOSE 80 01/17/2018   CHOL 280 (H) 01/17/2018   TRIG 189.0 (H) 01/17/2018   HDL 64.70 01/17/2018   LDLCALC 178 (H) 01/17/2018   ALT 50 (H) 01/17/2018   AST 24 01/17/2018   NA 137 01/17/2018   K 4.2 01/17/2018   CL 100 01/17/2018   CREATININE 0.87 01/17/2018   BUN 13 01/17/2018   CO2 31 01/17/2018   TSH 4.116 08/25/2014    Mm Diag Breast Tomo Uni Right  Result Date: 04/14/2018 CLINICAL DATA:  52 year old patient recalled from recent new baseline screening mammogram for evaluation of a possible asymmetry in the right breast. EXAM: DIGITAL DIAGNOSTIC UNILATERAL RIGHT MAMMOGRAM WITH CAD AND TOMO COMPARISON:  April 11, 2018 ACR Breast Density Category b: There are scattered areas of fibroglandular density. FINDINGS: Spot compression views of the posterior upper central/right breast posteriorly show an island of fibroglandular tissue. There is no mass or architectural distortion. Mammographic images were processed with CAD. IMPRESSION: Island of normal fibroglandular tissue accounts for the possible asymmetry seen on the recent baseline screening mammogram. No evidence of malignancy. RECOMMENDATION: Screening mammogram in one year.(Code:SM-B-01Y) I have discussed the findings and recommendations with the patient. Results were also provided in writing at the conclusion of the visit. If applicable, a reminder letter will be sent to the patient regarding the next appointment. BI-RADS CATEGORY  1: Negative. Electronically Signed   By: Britta MccreedySusan  Turner M.D.   On: 04/14/2018 10:32    Assessment & Plan:   Jenna Arroyo was seen today for cough.  Diagnoses and all orders for this visit:  Acute bronchitis, unspecified organism -     cetirizine (ZYRTEC) 10 MG tablet; Take 1 tablet (10 mg total) by mouth daily. -     DG Chest 2 View -     albuterol  (PROVENTIL HFA;VENTOLIN HFA) 108 (90 Base) MCG/ACT inhaler; Inhale 1-2 puffs into the lungs every 6 (six) hours as needed. -     HYDROcodone-homatropine (HYCODAN) 5-1.5 MG/5ML syrup; Take 5 mLs by mouth every 8 (eight) hours as needed for cough.  PND (post-nasal drip) -     cetirizine (ZYRTEC) 10 MG tablet; Take 1 tablet (10 mg total) by mouth daily. -     DG Chest 2 View -     albuterol (PROVENTIL HFA;VENTOLIN HFA) 108 (90 Base) MCG/ACT inhaler; Inhale 1-2 puffs into the lungs every 6 (six) hours as needed.  Normal CXR. She to let me know if seh changes her mind about use of oral prednisone.  I am having Jenna Arroyo start on cetirizine, albuterol, and HYDROcodone-homatropine. I am also having her maintain her ALPRAZolam, DULoxetine, Probiotic Product (PROBIOTIC DAILY PO), PREBIOTIC PRODUCT  PO, fluticasone, cholestyramine, atenolol, ELDERBERRY PO, pantoprazole, hydrOXYzine, irbesartan, promethazine, and ARMOUR THYROID.  Meds ordered this encounter  Medications  . cetirizine (ZYRTEC) 10 MG tablet    Sig: Take 1 tablet (10 mg total) by mouth daily.    Dispense:  30 tablet    Refill:  0    Order Specific Question:   Supervising Provider    Answer:   MATTHEWS, CODY [[id number]]  . albuterol (PROVENTIL HFA;VENTOLIN HFA) 108 (90 Base) MCG/ACT inhaler    Sig: Inhale 1-2 puffs into the lungs every 6 (six) hours as needed.    Dispense:  1 Inhaler    Refill:  0    Order Specific Question:   Supervising Provider    Answer:   MATTHEWS, CODY [[id number]]  . HYDROcodone-homatropine (HYCODAN) 5-1.5 MG/5ML syrup    Sig: Take 5 mLs by mouth every 8 (eight) hours as needed for cough.    Dispense:  50 mL    Refill:  0    Order Specific Question:   Supervising Provider    Answer:   MATTHEWS, CODY [[id number]]    Problem List Items Addressed This Visit    None    Visit Diagnoses    Acute bronchitis, unspecified organism    -  Primary   Relevant Medications   cetirizine (ZYRTEC) 10 MG tablet   albuterol  (PROVENTIL HFA;VENTOLIN HFA) 108 (90 Base) MCG/ACT inhaler   HYDROcodone-homatropine (HYCODAN) 5-1.5 MG/5ML syrup   Other Relevant Orders   DG Chest 2 View (Completed)   PND (post-nasal drip)       Relevant Medications   cetirizine (ZYRTEC) 10 MG tablet   albuterol (PROVENTIL HFA;VENTOLIN HFA) 108 (90 Base) MCG/ACT inhaler   Other Relevant Orders   DG Chest 2 View (Completed)       Follow-up: No follow-ups on file.  Alysia Penna, NP

## 2018-06-14 NOTE — Telephone Encounter (Signed)
Summary: cough, vomiting phlegm   Pt called in stating she is feeling worse since OV 06/07/18 with Dr. Drue NovelPaz. She states she spoke with someone previously and was advised there was nothing else they can do. I do not see documentation of this. Pt did not know who she spoke to. Pt has a productive cough and has been vomiting phlegm. Last episode of vomiting last night. Phlegm is yellowish green. Pt states she has chronic lyme disease and a compromised immune system and concerned about getting worse. Pt has head and chest congestion. Denies being SOB but said she has to take deep breaths. Pt is hoping for something to offer relief and cannot take steroids due to HTN. Please call to advise.     Patient is calling because she is not getting better- her cough is productive at times- she is coughing up thick yellow green mucus. She is still having nausea at times. Appointment scheduled ( patient request female provider)  Reason for Disposition . [1] Continuous (nonstop) coughing interferes with work or school AND [2] no improvement using cough treatment per Care Advice  Answer Assessment - Initial Assessment Questions 1. ONSET: "When did the cough begin?"      End of November- 11/27 2. SEVERITY: "How bad is the cough today?"      Coughing spasms- inhaler helps a little 3. RESPIRATORY DISTRESS: "Describe your breathing."      Congested- breathing is affected at times she does have to draw a deep breath- she die have wheezing the other day. 4. FEVER: "Do you have a fever?" If so, ask: "What is your temperature, how was it measured, and when did it start?"     Low grade temperature 5. SPUTUM: "Describe the color of your sputum" (clear, white, yellow, green)     Yellowish green- thick 6. HEMOPTYSIS: "Are you coughing up any blood?" If so ask: "How much?" (flecks, streaks, tablespoons, etc.)     no 7. CARDIAC HISTORY: "Do you have any history of heart disease?" (e.g., heart attack, congestive heart failure)       Tachycardia  8. LUNG HISTORY: "Do you have any history of lung disease?"  (e.g., pulmonary embolus, asthma, emphysema)     no 9. PE RISK FACTORS: "Do you have a history of blood clots?" (or: recent major surgery, recent prolonged travel, bedridden)     no 10. OTHER SYMPTOMS: "Do you have any other symptoms?" (e.g., runny nose, wheezing, chest pain)       Nasal congestion, some wheezing, fatigue, nausea 11. PREGNANCY: "Is there any chance you are pregnant?" "When was your last menstrual period?"       n/a 12. TRAVEL: "Have you traveled out of the country in the last month?" (e.g., travel history, exposures)       no  Protocols used: COUGH - ACUTE PRODUCTIVE-A-AH

## 2018-06-14 NOTE — Patient Instructions (Addendum)
Normal CXR. She to let me know if seh changes her mind about use of oral prednisone

## 2018-06-15 ENCOUNTER — Telehealth: Payer: Self-pay | Admitting: Nurse Practitioner

## 2018-06-15 ENCOUNTER — Encounter: Payer: Self-pay | Admitting: Nurse Practitioner

## 2018-06-15 NOTE — Telephone Encounter (Signed)
Pt. called from work; her manager requested her to call and ask if she is considered contagious.  Pt. was eval. in office 12/10 for cough and PND; diagnosed with Acute Bronchitis.  Advised pt. that coughing or sneezing can cause airborne respiratory droplets that can spread the virus.  Also advised if running a fever, she could be contagious until afebrile for 24 hrs.  Encouraged to cover her mouth and nose with coughing or sneezing, and to wash hands and disinfect her work station frequently.  Pt. Verb. Understanding.

## 2018-06-19 DIAGNOSIS — R1110 Vomiting, unspecified: Secondary | ICD-10-CM | POA: Diagnosis not present

## 2018-06-19 DIAGNOSIS — J209 Acute bronchitis, unspecified: Secondary | ICD-10-CM | POA: Diagnosis not present

## 2018-07-11 DIAGNOSIS — A6920 Lyme disease, unspecified: Secondary | ICD-10-CM | POA: Diagnosis not present

## 2018-07-11 DIAGNOSIS — I10 Essential (primary) hypertension: Secondary | ICD-10-CM | POA: Diagnosis not present

## 2018-07-11 DIAGNOSIS — E559 Vitamin D deficiency, unspecified: Secondary | ICD-10-CM | POA: Diagnosis not present

## 2018-07-11 DIAGNOSIS — R748 Abnormal levels of other serum enzymes: Secondary | ICD-10-CM | POA: Diagnosis not present

## 2018-07-11 DIAGNOSIS — E063 Autoimmune thyroiditis: Secondary | ICD-10-CM | POA: Diagnosis not present

## 2018-07-11 DIAGNOSIS — M2550 Pain in unspecified joint: Secondary | ICD-10-CM | POA: Diagnosis not present

## 2018-07-11 DIAGNOSIS — R5383 Other fatigue: Secondary | ICD-10-CM | POA: Diagnosis not present

## 2018-07-11 DIAGNOSIS — E039 Hypothyroidism, unspecified: Secondary | ICD-10-CM | POA: Diagnosis not present

## 2018-07-14 ENCOUNTER — Encounter (HOSPITAL_BASED_OUTPATIENT_CLINIC_OR_DEPARTMENT_OTHER): Payer: Self-pay | Admitting: Adult Health

## 2018-07-14 ENCOUNTER — Emergency Department (HOSPITAL_BASED_OUTPATIENT_CLINIC_OR_DEPARTMENT_OTHER)
Admission: EM | Admit: 2018-07-14 | Discharge: 2018-07-14 | Disposition: A | Discharge status: Home / Self Care | Payer: BLUE CROSS/BLUE SHIELD | Attending: Emergency Medicine | Admitting: Emergency Medicine

## 2018-07-14 ENCOUNTER — Other Ambulatory Visit: Payer: Self-pay

## 2018-07-14 ENCOUNTER — Telehealth: Payer: Self-pay | Admitting: Family Medicine

## 2018-07-14 ENCOUNTER — Emergency Department (HOSPITAL_BASED_OUTPATIENT_CLINIC_OR_DEPARTMENT_OTHER): Payer: BLUE CROSS/BLUE SHIELD

## 2018-07-14 ENCOUNTER — Ambulatory Visit: Payer: Self-pay

## 2018-07-14 DIAGNOSIS — I10 Essential (primary) hypertension: Secondary | ICD-10-CM | POA: Insufficient documentation

## 2018-07-14 DIAGNOSIS — Z79899 Other long term (current) drug therapy: Secondary | ICD-10-CM | POA: Insufficient documentation

## 2018-07-14 DIAGNOSIS — G43C0 Periodic headache syndromes in child or adult, not intractable: Secondary | ICD-10-CM

## 2018-07-14 DIAGNOSIS — R079 Chest pain, unspecified: Secondary | ICD-10-CM | POA: Insufficient documentation

## 2018-07-14 DIAGNOSIS — E039 Hypothyroidism, unspecified: Secondary | ICD-10-CM | POA: Insufficient documentation

## 2018-07-14 LAB — CBC
HEMATOCRIT: 43.4 % (ref 36.0–46.0)
Hemoglobin: 14.6 g/dL (ref 12.0–15.0)
MCH: 30.4 pg (ref 26.0–34.0)
MCHC: 33.6 g/dL (ref 30.0–36.0)
MCV: 90.2 fL (ref 80.0–100.0)
Platelets: 387 10*3/uL (ref 150–400)
RBC: 4.81 MIL/uL (ref 3.87–5.11)
RDW: 13 % (ref 11.5–15.5)
WBC: 10.5 10*3/uL (ref 4.0–10.5)
nRBC: 0 % (ref 0.0–0.2)

## 2018-07-14 LAB — BASIC METABOLIC PANEL
Anion gap: 7 (ref 5–15)
BUN: 17 mg/dL (ref 6–20)
CHLORIDE: 100 mmol/L (ref 98–111)
CO2: 29 mmol/L (ref 22–32)
CREATININE: 0.81 mg/dL (ref 0.44–1.00)
Calcium: 9.5 mg/dL (ref 8.9–10.3)
GFR calc Af Amer: >60 mL/min (ref >60)
GFR calc non Af Amer: >60 mL/min (ref >60)
Glucose, Bld: 96 mg/dL (ref 70–99)
Potassium: 3.7 mmol/L (ref 3.5–5.1)
SODIUM: 136 mmol/L (ref 135–145)

## 2018-07-14 LAB — TROPONIN I: Troponin I: <0.03 ng/mL (ref <0.03)

## 2018-07-14 MED ORDER — DIPHENHYDRAMINE HCL 50 MG/ML IJ SOLN
25.0000 mg | Freq: Once | INTRAMUSCULAR | Status: AC
  Administered 2018-07-14: 25 mg via INTRAVENOUS
  Filled 2018-07-14: qty 1

## 2018-07-14 MED ORDER — PROCHLORPERAZINE EDISYLATE 10 MG/2ML IJ SOLN
10.0000 mg | Freq: Once | INTRAMUSCULAR | Status: AC
  Administered 2018-07-14: 10 mg via INTRAVENOUS
  Filled 2018-07-14: qty 2

## 2018-07-14 NOTE — ED Provider Notes (Signed)
[facility] EMERGENCY DEPARTMENT Provider Note   CSN: [id number] Arrival date & time: 07/14/18  1620     History   Chief Complaint Chief Complaint  Patient presents with  . Chest Pain    HPI Jenna Arroyo is a 53 y.o. female.  Patient presents to ED with left sided, cramping, anterior chest pain. It started four days ago, is intermittent in nature, and seems to worsen when laying down. She is under treatment for recurrent lyme disease. A recent cardiac-CRP measurement was elevated, and patient was concerned. She has been evaluated for tachycardia in the past. Her most recent stress test was normal. She also is complaining of a migraine headache. Her pain is located in the back of her head and her neck. This is the usual location of her headache.   The history is provided by the patient. No language interpreter was used.  Chest Pain  Pain location:  L chest Pain quality comment:  Cramping Pain radiates to:  Does not radiate Pain severity:  Moderate Onset quality:  Sudden Duration:  4 days Timing:  Sporadic Progression:  Waxing and waning Chronicity:  New Associated symptoms: headache and shortness of breath   Associated symptoms: no fever, no nausea and no vomiting   Risk factors: high cholesterol and hypertension   Migraine  This is a recurrent problem. The current episode started more than 2 days ago. The problem has not changed since onset.Associated symptoms include chest pain, headaches and shortness of breath.    Past Medical History:  Diagnosis Date  . Lyme disease   . Thyroid disease     Patient Active Problem List   Diagnosis Date Noted  . Viral syndrome 05/23/2018  . GAD (generalized anxiety disorder) 04/22/2018  . ADD (attention deficit disorder) 04/22/2018  . Axillary lymphadenopathy 12/11/2016  . Lyme disease 12/11/2016  . Dyslipidemia 06/18/2016  . Essential hypertension, benign 06/18/2016  . Acquired hypothyroidism 06/18/2016    Past  Surgical History:  Procedure Laterality Date  . ABDOMINAL EXPLORATION SURGERY    . APPENDECTOMY    . MASTOIDECTOMY    . TONSILECTOMY/ADENOIDECTOMY WITH MYRINGOTOMY       OB History   No obstetric history on file.      Home Medications    Prior to Admission medications   Medication Sig Start Date End Date Taking? Authorizing Provider  albuterol (PROVENTIL HFA;VENTOLIN HFA) 108 (90 Base) MCG/ACT inhaler Inhale 1-2 puffs into the lungs every 6 (six) hours as needed. 06/14/18   Nche, Bonna Gainsharlotte Lum, NP  ALPRAZolam Prudy Feeler(XANAX) 0.5 MG tablet Take 0.5 mg by mouth daily. Take 5 (2.5 mg) a day 02/01/17   [provider]  ARMOUR THYROID 15 MG tablet Take 2 tablets (30 mg total) by mouth daily. 06/07/18   Copland, Gwenlyn FoundJessica C, MD  atenolol (TENORMIN) 25 MG tablet Take 1 tablet (25 mg total) by mouth 2 (two) times daily. 01/17/18   Copland, Gwenlyn FoundJessica C, MD  cetirizine (ZYRTEC) 10 MG tablet Take 1 tablet (10 mg total) by mouth daily. 06/14/18   Nche, Bonna Gainsharlotte Lum, NP  cholestyramine Lanetta Inch(QUESTRAN) 4 GM/DOSE powder BEGIN WITH 1/4 TSP QOD. FOLLOW PROTOCOL 01/12/18   [provider]  DULoxetine (CYMBALTA) 60 MG capsule Take 60 mg by mouth daily. 09/07/16   [provider]  ELDERBERRY PO Take by mouth.    [provider]  fluticasone (FLONASE) 50 MCG/ACT nasal spray Place 2 sprays into both nostrils daily. Patient not taking: Reported on 06/14/2018 08/02/17  Saguier, Ramon DredgeEdward, PA-C  HYDROcodone-homatropine [facility](HYCODAN) 5-1.5 MG/5ML syrup Take 5 mLs by mouth every 8 (eight) hours as needed for cough. 06/14/18   Nche, Bonna Gainsharlotte Lum, NP  hydrOXYzine (ATARAX/VISTARIL) 25 MG tablet Take 25 mg by mouth 3 (three) times daily as needed.    [provider]  irbesartan (AVAPRO) 150 MG tablet Take 1 tablet (150 mg total) by mouth daily. 05/24/18   Copland, Gwenlyn FoundJessica C, MD  pantoprazole (PROTONIX) 40 MG tablet Take 1 tablet (40 mg total) by mouth 2 (two) times daily. Take Protonix 40 mg BID for  8 weeks,take this medication 30 mins to 1 hour prior to morning and evening meals. Then decrease to once daily until return office appointment. Patient not taking: Reported on 06/07/2018 03/29/18   Cirigliano, Vito V, DO  PREBIOTIC PRODUCT PO Take by mouth. Mix in shake and drink once a day.    [provider]  Probiotic Product (PROBIOTIC DAILY PO) Take 1 capsule by mouth daily.    [provider]  promethazine (PHENERGAN) 12.5 MG tablet Take 1 tablet (12.5 mg total) by mouth every 8 (eight) hours as needed for nausea or vomiting. 06/07/18   Wanda PlumpPaz, Jose E, MD    Family History Family History  Problem Relation Age of Onset  . Hypertension Mother   . Heart disease Father   . Hypertension Sister   . Hypertension Brother   . Colon cancer Neg Hx     Social History Social History   Tobacco Use  . Smoking status: Never Smoker  . Smokeless tobacco: Never Used  Substance Use Topics  . Alcohol use: No  . Drug use: No     Allergies   Ceftriaxone sodium in dextrose; Guaifenesin & derivatives; Meperidine; Other; Penicillins; Pseudoephedrine; Rosuvastatin calcium; Simvastatin; Ciprofloxacin; and Rocephin [ceftriaxone]   Review of Systems Review of Systems  Constitutional: Negative for fever.  Respiratory: Positive for shortness of breath.   Cardiovascular: Positive for chest pain.  Gastrointestinal: Negative for nausea and vomiting.  Neurological: Positive for headaches.  All other systems reviewed and are negative.    Physical Exam Updated Vital Signs BP 132/85   Pulse 80   Temp 98.4 F (36.9 C) (Oral)   Resp 18   Ht 5' 6.5" (1.689 m)   Wt 99.3 kg   SpO2 97%   BMI 34.82 kg/m   Physical Exam Vitals signs and nursing note reviewed.  Constitutional:      Appearance: She is well-developed. She is not ill-appearing.  HENT:     Head: Normocephalic.  Eyes:     Conjunctiva/sclera: Conjunctivae normal.  Cardiovascular:     Rate and Rhythm: Normal rate and  regular rhythm.     Heart sounds: Normal heart sounds.  Pulmonary:     Effort: Pulmonary effort is normal.     Breath sounds: Normal breath sounds.  Abdominal:     General: Bowel sounds are normal.     Palpations: Abdomen is soft.  Musculoskeletal: Normal range of motion.  Skin:    General: Skin is warm and dry.  Neurological:     Mental Status: She is alert and oriented to person, place, and time.  Psychiatric:        Mood and Affect: Mood normal.      ED Treatments / Results  Labs (all labs ordered are listed, but only abnormal results are displayed) Labs Reviewed  BASIC METABOLIC PANEL  CBC  TROPONIN I  PREGNANCY, URINE    EKG None  Radiology  Dg Chest 2 View  Result Date: 07/14/2018 CLINICAL DATA:  Cramping chest pain x4 days. EXAM: CHEST - 2 VIEW COMPARISON:  07/21/2016 FINDINGS: The heart size and mediastinal contours are within normal limits. Both lungs are clear. The visualized skeletal structures are unremarkable. IMPRESSION: No active cardiopulmonary disease. Electronically Signed   By: Tollie Eth M.D.   On: 07/14/2018 17:34    Procedures Procedures (including critical care time)  Medications Ordered in ED Medications  prochlorperazine (COMPAZINE) injection 10 mg (has no administration in time range)  diphenhydrAMINE (BENADRYL) injection 25 mg (has no administration in time range)     Initial Impression / Assessment and Plan / ED Course  I have reviewed the triage vital signs and the nursing notes.  Pertinent labs & imaging results that were available during my care of the patient were reviewed by me and considered in my medical decision making (see chart for details).     Pt HA treated and improved while in ED.  Presentation is like pts typical HA and non concerning for [facility], ICH, Meningitis, or temporal arteritis. Pt is afebrile with no focal neuro deficits, nuchal rigidity, or change in vision.  Patient is to be discharged with recommendation to  follow up with PCP in regards to today's hospital visit. Chest pain is not likely of cardiac or pulmonary etiology d/t presentation, perc negative, VSS, no tracheal deviation, no JVD or new murmur, RRR, breath sounds equal bilaterally, EKG without acute abnormalities, negative troponin, and negative CXR. Pt has been advised to return to the ED is CP becomes exertional, associated with diaphoresis or nausea, radiates to left jaw/arm, worsens or becomes concerning in any way. Pt appears reliable for follow up and is agreeable to discharge.   Case has been discussed with Dr Rubin Payor who agrees with the above plan to discharge.   Final Clinical Impressions(s) / ED Diagnoses   Final diagnoses:  Nonspecific chest pain  Periodic headache syndrome, not intractable    ED Discharge Orders    None       Felicie Morn, NP 07/14/18 [place]    Benjiman Core, MD 07/15/18 [phone]

## 2018-07-14 NOTE — Telephone Encounter (Addendum)
Copied from CRM [id number]. Topic: Quick Communication - See Telephone Encounter >> Jul 14, 2018  2:42 PM Trula Slade wrote: CRM for notification. See Telephone encounter for: 07/14/18. Patient received some labs back from [employer], [employer] and she wanted the provider to know the following results:  C-Reactive Protein, Cardiac - 7.83mg -L > They said this is high for cardiac risk  Patient is concerned because she is having pains at night in her chest for about 4-5 days.  It's like a cramp but then it goes away.  Please advise.    She will be triaged about the chest pain

## 2018-07-14 NOTE — Telephone Encounter (Signed)
Pt called stating that she has been having chest pain at night.The pain is in her left lower chest around the nipple area.  She describes it as deep. It wakes her from sleep.  This pain started 4 nights ago when vomiting. She say it feel like a bubble bursting. She rates the pain at 4. She denies dizziness SOB she says is normal and come from her chronic Lyme disease. She has very high cholesterol. She has just finished a course of meds for a flare of her lyme dx. She states that nausea and vomiting occur from a die off of the Lyme disease. Pt will go to ED today for evaluation of her symptoms. Care advice read to patient. Pt verbalized understanding of all instructions   Reason for Disposition . Chest pain lasts > 5 minutes (Exceptions: chest pain occurring > 3 days ago and now asymptomatic; same as previously diagnosed heartburn and has accompanying sour taste in mouth)  Answer Assessment - Initial Assessment Questions 1. LOCATION: "Where does it hurt?"       Left lower chest just below the nipple line 2. RADIATION: "Does the pain go anywhere else?" (e.g., into neck, jaw, arms, back)     Neck and back pain there when having flare of Lyme Dx 3. ONSET: "When did the chest pain begin?" (Minutes, hours or days)     4 nights ago when vomiting feel like a bubble 4. PATTERN "Does the pain come and go, or has it been constant since it started?"  "Does it get worse with exertion?"      no 5. DURATION: "How long does it last" (e.g., seconds, minutes, hours)     Seconds only at night 6. SEVERITY: "How bad is the pain?"  (e.g., Scale 1-10; mild, moderate, or severe)    - MILD (1-3): doesn't interfere with normal activities     - MODERATE (4-7): interferes with normal activities or awakens from sleep    - SEVERE (8-10): excruciating pain, unable to do any normal activities       4 7. CARDIAC RISK FACTORS: "Do you have any history of heart problems or risk factors for heart disease?" (e.g., prior heart  attack, angina; high blood pressure, diabetes, being overweight, high cholesterol, smoking, or strong family history of heart disease)     High cholesterol 8. PULMONARY RISK FACTORS: "Do you have any history of lung disease?"  (e.g., blood clots in lung, asthma, emphysema, birth control pills)     no 9. CAUSE: "What do you think is causing the chest pain?"     No possible lyme dx 10. OTHER SYMPTOMS: "Do you have any other symptoms?" (e.g., dizziness, nausea, vomiting, sweating, fever, difficulty breathing, cough)       Cough had flu 11. PREGNANCY: "Is there any chance you are pregnant?" "When was your last menstrual period?"       No  Protocols used: CHEST PAIN-A-AH

## 2018-07-14 NOTE — Telephone Encounter (Signed)
See other telephone encounter.

## 2018-07-14 NOTE — ED Triage Notes (Signed)
Presents with dull, cramping chest pain that began 4 days ago is worse with lying down at night and intermittent. "I got labs in from my integrative medicine clinic and it said I was high risk for some cardiac proteins they check but they didn't call me and it worried me"

## 2018-07-14 NOTE — ED Notes (Signed)
Pt medicated per EDP orders, pt instructed to remain on stretcher unless nursing staff in room, safety measures in place.

## 2018-07-14 NOTE — Telephone Encounter (Signed)
Called patient. She explains to me that she had been having chest pains that feel like "severe cramps" for the past 4-5 days. They last for about 30 seconds off and on. They feel "spaced out". The chest pain is worse when laying down. She states she has been extremely short of breath for the last several days. She has been vomiting and very nauseous. I explained to her that none of the providers could not see her today as we are full and especially if she is having chest pain along with MI symptoms she needs to be evaluated at the ER immediately.   Patient verbalized understanding and agrees. She was going to get dressed at head to this located ER.Routing to PCP for fyi.

## 2018-07-20 ENCOUNTER — Ambulatory Visit (INDEPENDENT_AMBULATORY_CARE_PROVIDER_SITE_OTHER): Payer: BLUE CROSS/BLUE SHIELD | Admitting: Neurology

## 2018-07-20 ENCOUNTER — Encounter: Payer: Self-pay | Admitting: Neurology

## 2018-07-20 ENCOUNTER — Encounter

## 2018-07-20 VITALS — BP 145/90 | HR 69 | Ht 66.0 in | Wt 224.0 lb

## 2018-07-20 DIAGNOSIS — Z820 Family history of epilepsy and other diseases of the nervous system: Secondary | ICD-10-CM | POA: Diagnosis not present

## 2018-07-20 DIAGNOSIS — E669 Obesity, unspecified: Secondary | ICD-10-CM

## 2018-07-20 DIAGNOSIS — R0683 Snoring: Secondary | ICD-10-CM | POA: Diagnosis not present

## 2018-07-20 DIAGNOSIS — G4719 Other hypersomnia: Secondary | ICD-10-CM | POA: Diagnosis not present

## 2018-07-20 DIAGNOSIS — R351 Nocturia: Secondary | ICD-10-CM | POA: Diagnosis not present

## 2018-07-20 NOTE — Progress Notes (Signed)
Subjective:    Patient ID: Jenna Arroyo is a 53 y.o. female.  HPI     [facility] [address], [address] [address] [place], [place] [address]  Dear Dr. Patsy Lageropland,   I saw your patient, Jenna Arroyo, upon your kind request in my sleep clinic today for initial consultation of her sleep disorder, in particular, concern for underlying obstructive sleep apnea. The patient is unaccompanied today. As you know, Jenna Arroyo is a 53 year old right-handed woman with an underlying medical history hypothyroidism, anxiety, depression, elevated blood pressure values, and obesity, who reports snoring and excessive daytime somnolence. I reviewed your office note from 04/11/2018. She reports waking up exhausted. She has woken up with a sense of gasping for air and from her own snoring. Her Epworth sleepiness score is 15 out of 24 today, fatigue score is 49 out of 63. She lives alone, she has 1 child. She quit smoking in 2002, she does not currently utilize any alcohol and drinks caffeine in the form of coffee, about 2 cups per day on average. She does not have recurrent headaches, she has nocturia about twice per average night. She has difficulty falling asleep. It may take more than one hour to fall asleep. Generally, she may be in bed by 9 and rise time is 6:45 AM or 7. She does not watch TV in her bedroom and does not have a TV currently. She has a family history of sleep apnea affecting her mother, maternal aunt, maternal uncle and her brother. She had an adenoidectomy and tonsillectomy at age 89. She has no pets. She has been divorced over 28 years. She has been stable on Cymbalta and alprazolam, follows with psychiatry. [facility]  Her Past Medical History Is Significant For: Past Medical History:  Diagnosis Date  . Lyme disease   . Thyroid disease    Her Past Surgical History Is Significant For: Past Surgical  History:  Procedure Laterality Date  . ABDOMINAL EXPLORATION SURGERY    . APPENDECTOMY    . MASTOIDECTOMY    . TONSILECTOMY/ADENOIDECTOMY WITH MYRINGOTOMY      Her Family History Is Significant For: Family History  Problem Relation Age of Onset  . Hypertension Mother   . Heart disease Father   . Hypertension Sister   . Hypertension Brother   . Colon cancer Neg Hx     Her Social History Is Significant For: Social History   Socioeconomic History  . Marital status: Divorced    Spouse name: Not on file  . Number of children: Not on file  . Years of education: Not on file  . Highest education level: Not on file  Occupational History  . Not on file  Social Needs  . Financial resource strain: Not on file  . Food insecurity:    Worry: Not on file    Inability: Not on file  . Transportation needs:    Medical: Not on file    Non-medical: Not on file  Tobacco Use  . Smoking status: Never Smoker  . Smokeless tobacco: Never Used  Substance and Sexual Activity  . Alcohol use: No  . Drug use: No  . Sexual activity: Not Currently  Lifestyle  . Physical activity:    Days per week: Not on file    Minutes per session: Not on file  . Stress: Not on file  Relationships  . Social connections:  Talks on phone: Not on file    Gets together: Not on file    Attends religious service: Not on file    Active member of club or organization: Not on file    Attends meetings of clubs or organizations: Not on file    Relationship status: Not on file  Other Topics Concern  . Not on file  Social History Narrative  . Not on file    Her Allergies Are:  Allergies  Allergen Reactions  . Ceftriaxone Sodium In Dextrose Other (See Comments)    "heart races and cannot breathe"  . Guaifenesin & Derivatives Other (See Comments)    "heart races, dizziness, light-headedness"  . Meperidine Other (See Comments)    "heart races"  . Other Rash  . Penicillins Hives  . Pseudoephedrine Other  (See Comments)    "heart races and high blood pressure"  . Rosuvastatin Calcium Nausea And Vomiting    Muscle aches   . Simvastatin Other (See Comments)    muscle aching   . Ciprofloxacin   . Rocephin [Ceftriaxone] Itching  :   Her Current Medications Are:  Outpatient Encounter Medications as of 07/20/2018  Medication Sig  . albuterol (PROVENTIL HFA;VENTOLIN HFA) 108 (90 Base) MCG/ACT inhaler Inhale 1-2 puffs into the lungs every 6 (six) hours as needed.  . ALPRAZolam (XANAX) 0.5 MG tablet Take 0.5 mg by mouth daily. Take 5 (2.5 mg) a day  . ARMOUR THYROID 15 MG tablet Take 2 tablets (30 mg total) by mouth daily.  Marland Kitchen atenolol (TENORMIN) 25 MG tablet Take 1 tablet (25 mg total) by mouth 2 (two) times daily.  . cetirizine (ZYRTEC) 10 MG tablet Take 1 tablet (10 mg total) by mouth daily.  . cholestyramine (QUESTRAN) 4 GM/DOSE powder BEGIN WITH 1/4 TSP QOD. FOLLOW PROTOCOL  . DULoxetine (CYMBALTA) 60 MG capsule Take 60 mg by mouth daily.  Marland Kitchen ELDERBERRY PO Take by mouth.  . fluticasone (FLONASE) 50 MCG/ACT nasal spray Place 2 sprays into both nostrils daily. (Patient not taking: Reported on 06/14/2018)  . HYDROcodone-homatropine (HYCODAN) 5-1.5 MG/5ML syrup Take 5 mLs by mouth every 8 (eight) hours as needed for cough.  . hydrOXYzine (ATARAX/VISTARIL) 25 MG tablet Take 25 mg by mouth 3 (three) times daily as needed.  . irbesartan (AVAPRO) 150 MG tablet Take 1 tablet (150 mg total) by mouth daily.  . pantoprazole (PROTONIX) 40 MG tablet Take 1 tablet (40 mg total) by mouth 2 (two) times daily. Take Protonix 40 mg BID for 8 weeks,take this medication 30 mins to 1 hour prior to morning and evening meals. Then decrease to once daily until return office appointment. (Patient not taking: Reported on 06/07/2018)  . PREBIOTIC PRODUCT PO Take by mouth. Mix in shake and drink once a day.  . Probiotic Product (PROBIOTIC DAILY PO) Take 1 capsule by mouth daily.  . promethazine (PHENERGAN) 12.5 MG tablet Take  1 tablet (12.5 mg total) by mouth every 8 (eight) hours as needed for nausea or vomiting.   No facility-administered encounter medications on file as of 07/20/2018.   :  Review of Systems:  Out of a complete 14 point review of systems, all are reviewed and negative with the exception of these symptoms as listed below:  Review of Systems  Neurological:       Pt presents today to discuss her sleep. Pt has never had a sleep study but does endorse snoring.  Epworth Sleepiness Scale 0= would never doze 1= slight chance of dozing  2= moderate chance of dozing 3= high chance of dozing  Sitting and reading: 2 Watching TV: 2 Sitting inactive in a public place (ex. Theater or meeting): 1 As a passenger in a car for an hour without a break: 2 Lying down to rest in the afternoon: 3 Sitting and talking to someone: 1 Sitting quietly after lunch (no alcohol): 2 In a car, while stopped in traffic: 2 Total: 15     Objective:  Neurological Exam  Physical Exam Physical Examination:   Vitals:   07/20/18 1045  BP: (!) 145/90  Pulse: 69    General Examination: The patient is a very pleasant 53 y.o. female in no acute distress. She appears well-developed and well-nourished and well groomed.   HEENT: Normocephalic, atraumatic, pupils are equal, round and reactive to light and accommodation. Extraocular tracking is good without limitation to gaze excursion or nystagmus noted. Normal smooth pursuit is noted. Hearing is grossly intact. Tympanic membranes are clear bilaterally. Face is symmetric with normal facial animation and normal facial sensation. Speech is clear with no dysarthria noted. There is no hypophonia. There is no lip, neck/head, jaw or voice tremor. Neck is supple with full range of passive and active motion. There are no carotid bruits on auscultation. Oropharynx exam reveals: moderate mouth dryness, adequate dental hygiene and mild airway crowding, due to smaller airway entry.  Mallampati is class II. Tongue protrudes centrally and palate elevates symmetrically. Tonsils are absent. Neck size is 14.75 inches. She has a minimal overbite.   Chest: Clear to auscultation without wheezing, rhonchi or crackles noted.  Heart: S1+S2+0, regular and normal without murmurs, rubs or gallops noted.   Abdomen: Soft, non-tender and non-distended with normal bowel sounds appreciated on auscultation.  Extremities: There is no obvious edema.   Skin: Warm and dry without trophic changes noted.  Musculoskeletal: exam reveals no obvious joint deformities, tenderness or joint swelling or erythema.   Neurologically:  Mental status: The patient is awake, alert and oriented in all 4 spheres. Her immediate and remote memory, attention, language skills and fund of knowledge are appropriate. There is no evidence of aphasia, agnosia, apraxia or anomia. Speech is clear with normal prosody and enunciation. Thought process is linear. Mood is normal and affect is normal.  Cranial nerves II - XII are as described above under HEENT exam. In addition: shoulder shrug is normal with equal shoulder height noted. Motor exam: Normal bulk, strength and tone is noted. There is no drift, tremor or rebound. Romberg is negative. Fine motor skills and coordination: grossly intact.  Cerebellar testing: No dysmetria or intention tremor on finger to nose testing. Heel to shin is unremarkable bilaterally. There is no truncal or gait ataxia.  Sensory exam: intact to light touch in the upper and lower extremities.  Gait, station and balance: She stands easily. No veering to one side is noted. No leaning to one side is noted. Posture is age-appropriate and stance is narrow based. Gait shows normal stride length and normal pace. No problems turning are noted. Tandem walk is doable.                Assessment and Plan:    In summary, JANAIYA FISHELL is a very pleasant 53 y.o.-year old female with an underlying medical history  hypothyroidism, anxiety, depression, elevated blood pressure values, and obesity, whose history and physical exam are concerning for obstructive sleep apnea (OSA). I had a long chat with the patient about my findings and the diagnosis of  OSA, its prognosis and treatment options. We talked about medical treatments, surgical interventions and non-pharmacological approaches. I explained in particular the risks and ramifications of untreated moderate to severe OSA, especially with respect to developing cardiovascular disease down the Road, including congestive heart failure, difficult to treat hypertension, cardiac arrhythmias, or stroke. Even type 2 diabetes has, in part, been linked to untreated OSA. Symptoms of untreated OSA include daytime sleepiness, memory problems, mood irritability and mood disorder such as depression and anxiety, lack of energy, as well as recurrent headaches, especially morning headaches. We talked about trying to maintain a healthy lifestyle in general, as well as the importance of weight control. I encouraged the patient to eat healthy, exercise daily and keep well hydrated, to keep a scheduled bedtime and wake time routine, to not skip any meals and eat healthy snacks in between meals. I advised the patient not to drive when feeling sleepy. I recommended the following at this time: sleep study with potential positive airway pressure titration. (We will score hypopneas at 3%).   I explained the sleep test procedure to the patient and also outlined possible surgical and non-surgical treatment options of OSA, including the use of a custom-made dental device (which would require a referral to a specialist dentist or oral surgeon), upper airway surgical options, such as pillar implants, radiofrequency surgery, tongue base surgery, and UPPP (which would involve a referral to an ENT surgeon). Rarely, jaw surgery such as mandibular advancement may be considered.  I also explained the CPAP  treatment option to the patient, who indicated that she would be willing to try CPAP if the need arises. I explained the importance of being compliant with PAP treatment, not only for insurance purposes but primarily to improve Her symptoms, and for the patient's long term health benefit, including to reduce Her cardiovascular risks. I answered all her questions today and the patient was in agreement. I plan to see her back after the sleep study is completed and encouraged her to call with any interim questions, concerns, problems or updates.   Thank you very much for allowing me to participate in the care of this nice patient. If I can be of any further assistance to you please do not hesitate to call me at [phone].  Sincerely,   Jenna Foley, MD, Arroyo

## 2018-07-20 NOTE — Patient Instructions (Signed)

## 2018-07-27 ENCOUNTER — Encounter: Payer: Self-pay | Admitting: Psychiatry

## 2018-07-27 ENCOUNTER — Ambulatory Visit (INDEPENDENT_AMBULATORY_CARE_PROVIDER_SITE_OTHER): Payer: Self-pay | Admitting: Psychiatry

## 2018-07-27 VITALS — BP 152/93 | HR 80

## 2018-07-27 DIAGNOSIS — F900 Attention-deficit hyperactivity disorder, predominantly inattentive type: Secondary | ICD-10-CM

## 2018-07-27 DIAGNOSIS — F411 Generalized anxiety disorder: Secondary | ICD-10-CM

## 2018-07-27 MED ORDER — HYDROXYZINE HCL 25 MG PO TABS: 25.0000 mg | ORAL_TABLET | Freq: Three times a day (TID) | ORAL | 0 refills | Status: DC | PRN

## 2018-07-27 NOTE — Progress Notes (Signed)
Jenna Arroyo [id number] 11/09/1965 53 y.o.  Subjective:   Patient ID:  Jenna Arroyo is a 53 y.o. (DOB 07/26/1965) female.  Chief Complaint:  Chief Complaint  Patient presents with  . Follow-up    Medication management    HPI Jenna Arroyo presents to the office today for follow-up of anxiety and mood.  Work not too bad with better management.  Had to move out from parent's (pushced by siblings)  and has had Lyme 16 years and chronic health problems.  They don't understand how sick I am.  They picked out apt behind her back.  This increased her financial stress.  Limited $ for health issues.    Sleep better in the day.  Went to neuro and planned sleep study in 8 days.  No questions concerns re: meds.   Review of Systems:  Review of Systems  Constitutional: Positive for fatigue.       Low exercise tolerance  Gastrointestinal: Positive for vomiting.  Musculoskeletal: Positive for arthralgias and neck pain.  Neurological: Positive for headaches. Negative for tremors and weakness.       Foggy headed.  Psychiatric/Behavioral: Positive for sleep disturbance. Negative for agitation, behavioral problems, confusion, decreased concentration, dysphoric mood, hallucinations, self-injury and suicidal ideas. The patient is nervous/anxious. The patient is not hyperactive.     Medications: I have reviewed the patient's current medications.  Current Outpatient Medications  Medication Sig Dispense Refill  . albuterol (PROVENTIL HFA;VENTOLIN HFA) 108 (90 Base) MCG/ACT inhaler Inhale 1-2 puffs into the lungs every 6 (six) hours as needed. 1 Inhaler 0  . ALPRAZolam (XANAX) 0.5 MG tablet Take 0.5 mg by mouth daily. Take 5 (2.5 mg) a day    . ARMOUR THYROID 15 MG tablet Take 2 tablets (30 mg total) by mouth daily. 180 tablet 1  . atenolol (TENORMIN) 25 MG tablet Take 1 tablet (25 mg total) by mouth 2 (two) times daily. 180 tablet 3  . Charcoal 200 MG CAPS Take by mouth every other day.    .  Cholecalciferol (VITAMIN D) 10 MCG/ML LIQD Take by mouth.    . cholestyramine (QUESTRAN) 4 GM/DOSE powder BEGIN WITH 1/4 TSP QOD. FOLLOW PROTOCOL  1  . DULoxetine (CYMBALTA) 60 MG capsule Take 60 mg by mouth daily.    . hydrOXYzine (ATARAX/VISTARIL) 25 MG tablet Take 25 mg by mouth 3 (three) times daily as needed.    . irbesartan (AVAPRO) 150 MG tablet Take 1 tablet (150 mg total) by mouth daily. 90 tablet 3  . pantoprazole (PROTONIX) 40 MG tablet Take 1 tablet (40 mg total) by mouth 2 (two) times daily. Take Protonix 40 mg BID for 8 weeks,take this medication 30 mins to 1 hour prior to morning and evening meals. Then decrease to once daily until return office appointment. 150 tablet 0  . Probiotic Product (PROBIOTIC DAILY PO) Take 1 capsule by mouth daily.    . cetirizine (ZYRTEC) 10 MG tablet Take 1 tablet (10 mg total) by mouth daily. (Patient not taking: Reported on 07/27/2018) 30 tablet 0  . ELDERBERRY PO Take by mouth.    . fluticasone (FLONASE) 50 MCG/ACT nasal spray Place 2 sprays into both nostrils daily. (Patient not taking: Reported on 06/14/2018) 16 g 1  . HYDROcodone-homatropine (HYCODAN) 5-1.5 MG/5ML syrup Take 5 mLs by mouth every 8 (eight) hours as needed for cough. (Patient not taking: Reported on 07/27/2018) 50 mL 0  . PREBIOTIC PRODUCT PO Take by mouth. Mix in shake and drink  once a day.    . promethazine (PHENERGAN) 12.5 MG tablet Take 1 tablet (12.5 mg total) by mouth every 8 (eight) hours as needed for nausea or vomiting. (Patient not taking: Reported on 07/27/2018) 12 tablet 0   No current facility-administered medications for this visit.     Medication Side Effects: None  Allergies:  Allergies  Allergen Reactions  . Ceftriaxone Sodium In Dextrose Other (See Comments)    "heart races and cannot breathe"  . Guaifenesin & Derivatives Other (See Comments)    "heart races, dizziness, light-headedness"  . Meperidine Other (See Comments)    "heart races"  . Other Rash  .  Penicillins Hives  . Pseudoephedrine Other (See Comments)    "heart races and high blood pressure"  . Rosuvastatin Calcium Nausea And Vomiting    Muscle aches   . Simvastatin Other (See Comments)    muscle aching   . Ciprofloxacin   . Rocephin [Ceftriaxone] Itching    Past Medical History:  Diagnosis Date  . Lyme disease   . Thyroid disease     Family History  Problem Relation Age of Onset  . Hypertension Mother   . Heart disease Father   . Hypertension Sister   . Hypertension Brother   . Colon cancer Neg Hx     Social History   Socioeconomic History  . Marital status: Divorced    Spouse name: Not on file  . Number of children: Not on file  . Years of education: Not on file  . Highest education level: Not on file  Occupational History  . Not on file  Social Needs  . Financial resource strain: Not on file  . Food insecurity:    Worry: Not on file    Inability: Not on file  . Transportation needs:    Medical: Not on file    Non-medical: Not on file  Tobacco Use  . Smoking status: Never Smoker  . Smokeless tobacco: Never Used  Substance and Sexual Activity  . Alcohol use: No  . Drug use: No  . Sexual activity: Not Currently  Lifestyle  . Physical activity:    Days per week: Not on file    Minutes per session: Not on file  . Stress: Not on file  Relationships  . Social connections:    Talks on phone: Not on file    Gets together: Not on file    Attends religious service: Not on file    Active member of club or organization: Not on file    Attends meetings of clubs or organizations: Not on file    Relationship status: Not on file  . Intimate partner violence:    Fear of current or ex partner: Not on file    Emotionally abused: Not on file    Physically abused: Not on file    Forced sexual activity: Not on file  Other Topics Concern  . Not on file  Social History Narrative  . Not on file    Past Medical History, Surgical history, Social history,  and Family history were reviewed and updated as appropriate.   Please see review of systems for further details on the patient's review from today.   Objective:   Physical Exam:  BP (!) 152/93 (BP Location: Left Arm)   Pulse 80   Physical Exam Constitutional:      General: She is not in acute distress.    Appearance: She is well-developed.  Musculoskeletal:  General: No deformity.  Neurological:     Mental Status: She is alert and oriented to person, place, and time.     Motor: No tremor.     Coordination: Coordination normal.     Gait: Gait normal.  Psychiatric:        Attention and Perception: Attention and perception normal.        Mood and Affect: Mood is anxious. Mood is not depressed. Affect is not labile, blunt, angry or inappropriate.        Speech: Speech normal.        Behavior: Behavior normal.        Thought Content: Thought content normal. Thought content does not include homicidal or suicidal ideation. Thought content does not include homicidal or suicidal plan.        Cognition and Memory: She exhibits impaired recent memory.        Judgment: Judgment normal.     Comments: Insight fair.  Somatic focus. No auditory or visual hallucinations. No delusions.      Lab Review:     Component Value Date/Time   NA 136 07/14/2018 1647   K 3.7 07/14/2018 1647   CL 100 07/14/2018 1647   CO2 29 07/14/2018 1647   GLUCOSE 96 07/14/2018 1647   BUN 17 07/14/2018 1647   CREATININE 0.81 07/14/2018 1647   CREATININE 0.90 12/11/2016 1613   CALCIUM 9.5 07/14/2018 1647   PROT 6.6 01/17/2018 1526   ALBUMIN 4.1 01/17/2018 1526   AST 24 01/17/2018 1526   ALT 50 (H) 01/17/2018 1526   ALKPHOS 91 01/17/2018 1526   BILITOT 0.6 01/17/2018 1526   GFRNONAA >60 07/14/2018 1647   GFRAA >60 07/14/2018 1647       Component Value Date/Time   WBC 10.5 07/14/2018 1647   RBC 4.81 07/14/2018 1647   HGB 14.6 07/14/2018 1647   HCT 43.4 07/14/2018 1647   PLT 387 07/14/2018 1647    MCV 90.2 07/14/2018 1647   MCH 30.4 07/14/2018 1647   MCHC 33.6 07/14/2018 1647   RDW 13.0 07/14/2018 1647   LYMPHSABS 1.9 03/02/2017 1301   MONOABS 0.4 03/02/2017 1301   EOSABS 0.3 03/02/2017 1301   BASOSABS 0.1 03/02/2017 1301    No results found for: POCLITH, LITHIUM   No results found for: PHENYTOIN, PHENOBARB, VALPROATE, CBMZ   .res Assessment: Plan:    GAD (generalized anxiety disorder)  Attention deficit hyperactivity disorder (ADHD), predominantly inattentive type   No indication for med changes.  Disc somatic concerns.  We discussed the short-term risks associated with benzodiazepines including sedation and increased fall risk among others.  Discussed long-term side effect risk including dependence, potential withdrawal symptoms, and the potential eventual dose-related risk of dementia.  FU 6 mos  Meredith Staggersarey Cottle, MD, DFAPA   Please see After Visit Summary for patient specific instructions.  Future Appointments  Date Time Provider Department Center  08/09/2018  8:00 PM [facility] None    No orders of the defined types were placed in this encounter.     -------------------------------

## 2018-07-28 ENCOUNTER — Encounter: Payer: Self-pay | Admitting: Family Medicine

## 2018-07-28 ENCOUNTER — Ambulatory Visit: Payer: BLUE CROSS/BLUE SHIELD | Admitting: Family Medicine

## 2018-07-28 VITALS — BP 126/82 | HR 87 | Temp 97.6°F | Resp 16 | Ht 66.0 in | Wt 224.0 lb

## 2018-07-28 DIAGNOSIS — F411 Generalized anxiety disorder: Secondary | ICD-10-CM

## 2018-07-28 DIAGNOSIS — R5382 Chronic fatigue, unspecified: Secondary | ICD-10-CM

## 2018-07-28 DIAGNOSIS — E039 Hypothyroidism, unspecified: Secondary | ICD-10-CM

## 2018-07-28 NOTE — Patient Instructions (Signed)
I will watch for your sleep apnea report- hopefully this will give [place] some good info! We will refer you to see endocrinology as well

## 2018-07-28 NOTE — Progress Notes (Signed)
[facility] [address] [place], [place] [address] [phone]  Date:  07/28/2018   Name:  Jenna Arroyo   DOB:  August 05, 1965   MRN:  [id number]  PCP:  Pearline Cables, MD    Chief Complaint: Lab Results (Thyroid) and Fatigue (headache, fatugie, thyroid disease/lyme disease related?)   History of Present Illness:  Jenna Arroyo is a 53 y.o. very pleasant female patient who presents with the following:  Here today with various concerns for a week or so She had called last week with complaint of chest pain and was sent to the ER.  She was evaluated and released to home, her chest pain was not thought to be cardiac in nature   She had some labs at [facility] - they did some alternative thyroid labs which she wants me to comment on.   She had a TSH and T4 which were both normal.  They also did some other thyroid labs which I am not familiar with; I advised her that I am not able to comment on these labs   Jenna Arroyo is being treated by [facility] for various concerns.  She states that she has chronic lyme disease as well as mold toxicity   Jenna Arroyo continues to have complaint of feeling very tired-  She is going to have a sleep study coming up in a couple of weeks  I explained that we hope this may help [place] determine the cause of some of her fatigue   She also continues to have complaint of nausea off and on- I had referred her to see GI for this concern  She had gastric polyps removed and she has a hiatal hernia She notes that she is still nauseated and is vomiting sometimes - however this is not new.  I recommended that she follow-up with GI regarding this issue   She asks if I can recommend someone to treat her "chronic lyme," explained that I cannot suggest anyone for this unfortunately     Patient Active Problem List   Diagnosis Date Noted  . Viral syndrome 05/23/2018  . GAD (generalized anxiety disorder) 04/22/2018  . ADD  (attention deficit disorder) 04/22/2018  . Axillary lymphadenopathy 12/11/2016  . Lyme disease 12/11/2016  . Dyslipidemia 06/18/2016  . Essential hypertension, benign 06/18/2016  . Acquired hypothyroidism 06/18/2016    Past Medical History:  Diagnosis Date  . Lyme disease   . Thyroid disease     Past Surgical History:  Procedure Laterality Date  . ABDOMINAL EXPLORATION SURGERY    . APPENDECTOMY    . MASTOIDECTOMY    . TONSILECTOMY/ADENOIDECTOMY WITH MYRINGOTOMY      Social History   Tobacco Use  . Smoking status: Never Smoker  . Smokeless tobacco: Never Used  Substance Use Topics  . Alcohol use: No  . Drug use: No    Family History  Problem Relation Age of Onset  . Hypertension Mother   . Heart disease Father   . Hypertension Sister   . Hypertension Brother   . Colon cancer Neg Hx     Allergies  Allergen Reactions  . Ceftriaxone Sodium In Dextrose Other (See Comments)    "heart races and cannot breathe"  . Guaifenesin & Derivatives Other (See Comments)    "heart races, dizziness, light-headedness"  . Meperidine Other (See Comments)    "heart races"  . Other Rash  . Penicillins Hives  . Pseudoephedrine Other (See  Comments)    "heart races and high blood pressure"  . Rosuvastatin Calcium Nausea And Vomiting    Muscle aches   . Simvastatin Other (See Comments)    muscle aching   . Ciprofloxacin   . Rocephin [Ceftriaxone] Itching    Medication list has been reviewed and updated.  Current Outpatient Medications on File Prior to Visit  Medication Sig Dispense Refill  . albuterol (PROVENTIL HFA;VENTOLIN HFA) 108 (90 Base) MCG/ACT inhaler Inhale 1-2 puffs into the lungs every 6 (six) hours as needed. 1 Inhaler 0  . ALPRAZolam (XANAX) 0.5 MG tablet Take 0.5 mg by mouth daily. Take 5 (2.5 mg) a day    . ARMOUR THYROID 15 MG tablet Take 2 tablets (30 mg total) by mouth daily. 180 tablet 1  . atenolol (TENORMIN) 25 MG tablet Take 1 tablet (25 mg total) by mouth  2 (two) times daily. 180 tablet 3  . Charcoal 200 MG CAPS Take by mouth every other day.    . charcoal activated, NO SORBITOL, (ACTIDOSE-AQUA) suspension Take 50 g by mouth once. 200 mg    . Cholecalciferol (VITAMIN D) 10 MCG/ML LIQD Take by mouth.    . cholestyramine (QUESTRAN) 4 GM/DOSE powder BEGIN WITH 1/4 TSP QOD. FOLLOW PROTOCOL  1  . DULoxetine (CYMBALTA) 60 MG capsule Take 60 mg by mouth daily.    Marland Kitchen. ELDERBERRY PO Take by mouth.    . fluticasone (FLONASE) 50 MCG/ACT nasal spray Place 2 sprays into both nostrils daily. 16 g 1  . HYDROcodone-homatropine (HYCODAN) 5-1.5 MG/5ML syrup Take 5 mLs by mouth every 8 (eight) hours as needed for cough. 50 mL 0  . hydrOXYzine (ATARAX/VISTARIL) 25 MG tablet Take 1 tablet (25 mg total) by mouth 3 (three) times daily as needed. 30 tablet 0  . irbesartan (AVAPRO) 150 MG tablet Take 1 tablet (150 mg total) by mouth daily. 90 tablet 3  . pantoprazole (PROTONIX) 40 MG tablet Take 1 tablet (40 mg total) by mouth 2 (two) times daily. Take Protonix 40 mg BID for 8 weeks,take this medication 30 mins to 1 hour prior to morning and evening meals. Then decrease to once daily until return office appointment. 150 tablet 0  . PREBIOTIC PRODUCT PO Take by mouth. Mix in shake and drink once a day.    . Probiotic Product (PROBIOTIC DAILY PO) Take 1 capsule by mouth daily.    . promethazine (PHENERGAN) 12.5 MG tablet Take 1 tablet (12.5 mg total) by mouth every 8 (eight) hours as needed for nausea or vomiting. 12 tablet 0   No current facility-administered medications on file prior to visit.     Review of Systems:  As per HPI- otherwise negative. Continues to feel very tired and to have difficulty losing weight   Physical Examination: Vitals:   07/28/18 1555  BP: 126/82  Pulse: 87  Resp: 16  Temp: 97.6 F (36.4 C)  SpO2: 97%   Vitals:   07/28/18 1555  Weight: 224 lb (101.6 kg)  Height: 5\' 6"  (1.676 m)   Body mass index is 36.15 kg/m. Ideal Body  Weight: Weight in (lb) to have BMI = 25: 154.6  GEN: WDWN, NAD, Non-toxic, A & O x 3, looks well, obese  HEENT: Atraumatic, Normocephalic. Neck supple. No masses, No LAD. Ears and Nose: No external deformity. CV: RRR, No M/G/R. No JVD. No thrill. No extra heart sounds. PULM: CTA B, no wheezes, crackles, rhonchi. No retractions. No resp. distress. No accessory muscle  EXTR: No  c/c/e NEURO Normal gait.  PSYCH: Normally interactive. Conversant. Not depressed or anxious appearing.  Calm demeanor.    Assessment and Plan: Acquired hypothyroidism - Plan: Ambulatory referral to Endocrinology  GAD (generalized anxiety disorder)  Chronic fatigue  Here today requesting that I interpret some non- standard thyroid labs that were ordered by her integrative doctor.  Complains that they will not answer her calls.  I explained that these are not tests that I am familiar with so I am not able to comment, I would seek the advice of the person who ordered these tests.  Also offered to have her see an endocrinologist about her thyroid and she is willing to do this  She continues to feel very tired - we await her sleep study which I hope will be helpful and informative   Jenna Arroyo continues to have concern about infection with chronic lyme disease and perhaps mold.  Unfortunately I am not able to recommend any physician to help with these conditions   Signed Abbe AmsterdamJessica Copland, MD

## 2018-08-08 DIAGNOSIS — R5383 Other fatigue: Secondary | ICD-10-CM | POA: Diagnosis not present

## 2018-08-08 DIAGNOSIS — M2550 Pain in unspecified joint: Secondary | ICD-10-CM | POA: Diagnosis not present

## 2018-08-08 DIAGNOSIS — A6920 Lyme disease, unspecified: Secondary | ICD-10-CM | POA: Diagnosis not present

## 2018-08-08 DIAGNOSIS — E039 Hypothyroidism, unspecified: Secondary | ICD-10-CM | POA: Diagnosis not present

## 2018-08-22 ENCOUNTER — Encounter: Payer: Self-pay | Admitting: Internal Medicine

## 2018-09-01 ENCOUNTER — Other Ambulatory Visit: Payer: Self-pay | Admitting: Psychiatry

## 2018-09-02 ENCOUNTER — Other Ambulatory Visit: Payer: Self-pay

## 2018-09-02 DIAGNOSIS — I10 Essential (primary) hypertension: Secondary | ICD-10-CM

## 2018-09-02 MED ORDER — ATENOLOL 25 MG PO TABS: 25.0000 mg | ORAL_TABLET | Freq: Two times a day (BID) | ORAL | 2 refills | Status: DC

## 2018-09-02 NOTE — Progress Notes (Signed)
Refill request form [facility] Jenna Arroyo, [facility] for Atenolol 25 mg bid 90 day, last office visit January 2020 to follow up in 6 months.  Next office visit 01/23/2019

## 2018-09-09 ENCOUNTER — Encounter: Payer: Self-pay | Admitting: Family Medicine

## 2018-09-09 ENCOUNTER — Ambulatory Visit: Payer: BLUE CROSS/BLUE SHIELD | Admitting: Family Medicine

## 2018-09-09 VITALS — BP 126/88 | HR 62 | Temp 97.7°F | Ht 66.0 in | Wt 225.0 lb

## 2018-09-09 DIAGNOSIS — J028 Acute pharyngitis due to other specified organisms: Secondary | ICD-10-CM | POA: Diagnosis not present

## 2018-09-09 DIAGNOSIS — J029 Acute pharyngitis, unspecified: Secondary | ICD-10-CM | POA: Diagnosis not present

## 2018-09-09 LAB — POCT RAPID STREP A (OFFICE): Rapid Strep A Screen: NEGATIVE

## 2018-09-09 MED ORDER — AMOXICILLIN 875 MG PO TABS: 875.0000 mg | ORAL_TABLET | Freq: Two times a day (BID) | ORAL | 0 refills | Status: DC

## 2018-09-09 NOTE — Patient Instructions (Signed)

## 2018-09-09 NOTE — Progress Notes (Signed)
Patient ID: Jenna Arroyo, female    DOB: May 04, 1966  Age: 53 y.o. MRN: [id number]    Subjective:  Subjective  HPI  Jenna Arroyo presents for sore throat- nausea and stomach.  This is a typical presentation for strep for her and her granddaughter was just dx with strep.   She states she had a fever last night but she does not have a thermometer.  Symptoms started 3 days ago.    Review of Systems  Constitutional: Positive for chills and fatigue. Negative for appetite change, diaphoresis and unexpected weight change.  HENT: Positive for congestion, ear pain and sore throat. Negative for dental problem, drooling, ear discharge, facial swelling, hearing loss, mouth sores and nosebleeds.   Eyes: Negative for pain, redness and visual disturbance.  Respiratory: Negative for cough, chest tightness, shortness of breath and wheezing.   Cardiovascular: Negative for chest pain, palpitations and leg swelling.  Endocrine: Negative for cold intolerance, heat intolerance, polydipsia, polyphagia and polyuria.  Genitourinary: Negative for difficulty urinating, dysuria and frequency.  Neurological: Negative for dizziness, light-headedness, numbness and headaches.    History Past Medical History:  Diagnosis Date  . Lyme disease   . Thyroid disease     She has a past surgical history that includes Tonsilectomy/adenoidectomy with myringotomy; Mastoidectomy; Appendectomy; and Abdominal exploration surgery.   Her family history includes Heart disease in her father; Hypertension in her brother, mother, and sister.She reports that she has never smoked. She has never used smokeless tobacco. She reports that she does not drink alcohol or use drugs.  Current Outpatient Medications on File Prior to Visit  Medication Sig Dispense Refill  . albuterol (PROVENTIL HFA;VENTOLIN HFA) 108 (90 Base) MCG/ACT inhaler Inhale 1-2 puffs into the lungs every 6 (six) hours as needed. 1 Inhaler 0  . ALPRAZolam (XANAX) 0.5 MG tablet  TAKE 1 TABLET BY MOUTH FIVE TIMES DAILY 150 tablet 2  . ARMOUR THYROID 15 MG tablet Take 2 tablets (30 mg total) by mouth daily. 180 tablet 1  . atenolol (TENORMIN) 25 MG tablet Take 1 tablet (25 mg total) by mouth 2 (two) times daily. 180 tablet 2  . Charcoal 200 MG CAPS Take by mouth every other day.    . charcoal activated, NO SORBITOL, (ACTIDOSE-AQUA) suspension Take 50 g by mouth once. 200 mg    . Cholecalciferol (VITAMIN D) 10 MCG/ML LIQD Take by mouth.    . cholestyramine (QUESTRAN) 4 GM/DOSE powder BEGIN WITH 1/4 TSP QOD. FOLLOW PROTOCOL  1  . DULoxetine (CYMBALTA) 60 MG capsule Take 60 mg by mouth daily.    Marland Kitchen ELDERBERRY PO Take by mouth.    . fluticasone (FLONASE) 50 MCG/ACT nasal spray Place 2 sprays into both nostrils daily. 16 g 1  . HYDROcodone-homatropine (HYCODAN) 5-1.5 MG/5ML syrup Take 5 mLs by mouth every 8 (eight) hours as needed for cough. 50 mL 0  . hydrOXYzine (ATARAX/VISTARIL) 25 MG tablet Take 1 tablet (25 mg total) by mouth 3 (three) times daily as needed. 30 tablet 0  . irbesartan (AVAPRO) 150 MG tablet Take 1 tablet (150 mg total) by mouth daily. 90 tablet 3  . pantoprazole (PROTONIX) 40 MG tablet Take 1 tablet (40 mg total) by mouth 2 (two) times daily. Take Protonix 40 mg BID for 8 weeks,take this medication 30 mins to 1 hour prior to morning and evening meals. Then decrease to once daily until return office appointment. 150 tablet 0  . PREBIOTIC PRODUCT PO Take by mouth. Mix in shake  and drink once a day.    . Probiotic Product (PROBIOTIC DAILY PO) Take 1 capsule by mouth daily.    . promethazine (PHENERGAN) 12.5 MG tablet Take 1 tablet (12.5 mg total) by mouth every 8 (eight) hours as needed for nausea or vomiting. 12 tablet 0   No current facility-administered medications on file prior to visit.      Objective:  Objective  Physical Exam Vitals signs and nursing note reviewed.  Constitutional:      Appearance: She is well-developed.  HENT:     Head:  Normocephalic and atraumatic.     Mouth/Throat:     Pharynx: Posterior oropharyngeal erythema present.  Eyes:     Conjunctiva/sclera: Conjunctivae normal.  Neck:     Musculoskeletal: Normal range of motion and neck supple.     Thyroid: No thyromegaly.     Vascular: No carotid bruit or JVD.  Cardiovascular:     Rate and Rhythm: Normal rate and regular rhythm.     Heart sounds: Normal heart sounds. No murmur.  Pulmonary:     Effort: Pulmonary effort is normal. No respiratory distress.     Breath sounds: Normal breath sounds. No wheezing or rales.  Chest:     Chest wall: No tenderness.  Abdominal:     General: There is no distension.     Palpations: Abdomen is soft.     Tenderness: There is no abdominal tenderness.  Neurological:     Mental Status: She is alert and oriented to person, place, and time.    BP 126/88   Pulse 62   Temp 97.7 F (36.5 C)   Ht 5\' 6"  (1.676 m)   Wt 225 lb (102.1 kg)   SpO2 98%   BMI 36.32 kg/m  Wt Readings from Last 3 Encounters:  09/09/18 225 lb (102.1 kg)  07/28/18 224 lb (101.6 kg)  07/20/18 224 lb (101.6 kg)     Lab Results  Component Value Date   WBC 10.5 07/14/2018   HGB 14.6 07/14/2018   HCT 43.4 07/14/2018   PLT 387 07/14/2018   GLUCOSE 96 07/14/2018   CHOL 280 (H) 01/17/2018   TRIG 189.0 (H) 01/17/2018   HDL 64.70 01/17/2018   LDLCALC 178 (H) 01/17/2018   ALT 50 (H) 01/17/2018   AST 24 01/17/2018   NA 136 07/14/2018   K 3.7 07/14/2018   CL 100 07/14/2018   CREATININE 0.81 07/14/2018   BUN 17 07/14/2018   CO2 29 07/14/2018   TSH 4.116 08/25/2014    Dg Chest 2 View  Result Date: 07/14/2018 CLINICAL DATA:  Cramping chest pain x4 days. EXAM: CHEST - 2 VIEW COMPARISON:  07/21/2016 FINDINGS: The heart size and mediastinal contours are within normal limits. Both lungs are clear. The visualized skeletal structures are unremarkable. IMPRESSION: No active cardiopulmonary disease. Electronically Signed   By: Tollie Eth M.D.   On:  07/14/2018 17:34     Assessment & Plan:  Plan  I am having Jenna Arroyo start on amoxicillin. I am also having her maintain her DULoxetine, Probiotic Product (PROBIOTIC DAILY PO), PREBIOTIC PRODUCT PO, fluticasone, cholestyramine, ELDERBERRY PO, pantoprazole, irbesartan, promethazine, Armour Thyroid, albuterol, HYDROcodone-homatropine, Charcoal, Vitamin D, hydrOXYzine, charcoal activated (NO SORBITOL), ALPRAZolam, and atenolol.  Meds ordered this encounter  Medications  . amoxicillin (AMOXIL) 875 MG tablet    Sig: Take 1 tablet (875 mg total) by mouth 2 (two) times daily.    Dispense:  20 tablet    Refill:  0  Pt states she is not allergic to amoxicillin    Problem List Items Addressed This Visit    None    Visit Diagnoses    Pharyngitis due to other organism    -  Primary   Relevant Medications   amoxicillin (AMOXIL) 875 MG tablet   Other Relevant Orders   Culture, Group A Strep   Sore throat       Relevant Orders   POCT rapid strep A (Completed)      Follow-up: Return if symptoms worsen or fail to improve.  Donato Schultz, DO

## 2018-09-10 LAB — CULTURE, GROUP A STREP
MICRO NUMBER: 286928
SPECIMEN QUALITY:: ADEQUATE

## 2018-09-15 ENCOUNTER — Other Ambulatory Visit: Payer: Self-pay | Admitting: Psychiatry

## 2018-09-16 ENCOUNTER — Other Ambulatory Visit: Payer: Self-pay

## 2018-09-16 MED ORDER — DULOXETINE HCL 60 MG PO CPEP: 60.0000 mg | ORAL_CAPSULE | Freq: Every day | ORAL | 1 refills | Status: DC

## 2018-09-30 DIAGNOSIS — R5383 Other fatigue: Secondary | ICD-10-CM | POA: Diagnosis not present

## 2018-09-30 DIAGNOSIS — E039 Hypothyroidism, unspecified: Secondary | ICD-10-CM | POA: Diagnosis not present

## 2018-09-30 DIAGNOSIS — A6920 Lyme disease, unspecified: Secondary | ICD-10-CM | POA: Diagnosis not present

## 2018-09-30 DIAGNOSIS — M2550 Pain in unspecified joint: Secondary | ICD-10-CM | POA: Diagnosis not present

## 2018-10-03 ENCOUNTER — Other Ambulatory Visit: Payer: Self-pay | Admitting: Family Medicine

## 2018-10-12 ENCOUNTER — Ambulatory Visit (INDEPENDENT_AMBULATORY_CARE_PROVIDER_SITE_OTHER): Payer: Self-pay | Admitting: Family Medicine

## 2018-10-12 ENCOUNTER — Encounter: Payer: Self-pay | Admitting: Family Medicine

## 2018-10-12 ENCOUNTER — Other Ambulatory Visit: Payer: Self-pay

## 2018-10-12 ENCOUNTER — Ambulatory Visit: Payer: Self-pay

## 2018-10-12 DIAGNOSIS — R05 Cough: Secondary | ICD-10-CM

## 2018-10-12 DIAGNOSIS — R5383 Other fatigue: Secondary | ICD-10-CM

## 2018-10-12 DIAGNOSIS — R059 Cough, unspecified: Secondary | ICD-10-CM

## 2018-10-12 NOTE — Telephone Encounter (Addendum)
Pt called to state that she has some concerns about symptoms. She is experiencing cough sore throat and headaches. She is unsure if she has fever but feels feverish. She states that she is immune suppressed . She states that she has not had a know exposure to someone positive.  She states that she feels her symptoms are getting worse.  She has had symptoms for about one week. Care advice read to patient. Pt verbalized understanding. Call transferred to office schedular [facility] Cornea [facility] visit.  Reason for Disposition . 1] COVID-19 infection diagnosed or suspected AND [2] mild symptoms (fever, cough) AND [2] no trouble breathing or other complications  Answer Assessment - Initial Assessment Questions 1. COVID-19 DIAGNOSIS: "Who made your Coronavirus (COVID-19) diagnosis?" "Was it confirmed by a positive lab test?" If not diagnosed by a HCP, ask "Are there lots of cases (community spread) where you live?" (See public health department website, if unsure)   * MAJOR community spread: high number of cases; numbers of cases are increasing; many people hospitalized.   * MINOR community spread: low number of cases; not increasing; few or no people hospitalized     [facility] 2. ONSET: "When did the COVID-19 symptoms start?"      1 week ago 3. WORST SYMPTOM: "What is your worst symptom?" (e.g., cough, fever, shortness of breath, muscle aches)     weakness, sore throat 4. COUGH: "How bad is the cough?"       Worse at night  5. FEVER: "Do you have a fever?" If so, ask: "What is your temperature, how was it measured, and when did it start?"     Feverish, heat through face 6. RESPIRATORY STATUS: "Describe your breathing?" (e.g., shortness of breath, wheezing, unable to speak)      No feel like she has to stop to catch her breath more 7. BETTER-SAME-WORSE: "Are you getting better, staying the same or getting worse compared to yesterday?"  If getting worse, ask, "In what way?"     worse 8. HIGH RISK DISEASE: "Do  you have any chronic medical problems?" (e.g., asthma, heart or lung disease, weak immune system, etc.)     Chronic lyme Dx Hoshimoto thyroid Dx 9. PREGNANCY: "Is there any chance you are pregnant?" "When was your last menstrual period?"     No 10. OTHER SYMPTOMS: "Do you have any other symptoms?"  (e.g., runny nose, headache, sore throat, loss of smell)     Sore throat, runny nose, headache  Protocols used: CORONAVIRUS (COVID-19) DIAGNOSED OR SUSPECTED-A-AH

## 2018-10-12 NOTE — Progress Notes (Signed)
[facility] [address], [address] [place], [place] [address] [phone] [phone]  Date:  10/12/2018   Name:  Jenna GessLisa M Arroyo   DOB:  03/28/1966   MRN:  [id number]  PCP:  Pearline Cablesopland, Deneane Stifter C, MD    Chief Complaint: No chief complaint on file.   History of Present Illness:  Jenna Arroyo is a 53 y.o. very pleasant female patient who presents with the following: Pt ID confirmed by name and DOB Virtual visit today due to COVID-19 outbreak Patient last seen by myself in January, as follows.  Of note she tends to have concerns about chronic Lyme and mold toxicity  She had some labs at [facility] - they did some alternative thyroid labs which she wants me to comment on.   She had a TSH and T4 which were both normal.  They also did some other thyroid labs which I am not familiar with; I advised her that I am not able to comment on these labs  Jenna Arroyo is being treated by [facility] for various concerns.  She states that she has chronic lyme disease as well as mold toxicity  Jenna Arroyo continues to have complaint of feeling very tired-  She is going to have a sleep study coming up in a couple of weeks  I explained that we hope this may help us determine the cause of some of her fatigue  She also continues to have complaint of nausea off and on- I had referred her to see GI for this concern  She had gastric polyps removed and she has a hiatal hernia She notes that she is still nauseated and is vomiting sometimes - however this is not new.  I recommended that she follow-up with GI regarding this issue  She asks if I can recommend someone to treat her "chronic lyme," explained that I cannot suggest anyone for this unfortunately    My partner Dr. Laury AxonLowne saw her about 1 month ago for sore throat, treated with amoxicillin  She called in today with the following concern, was scheduled for what visit Pt called to state that she has some concerns about symptoms. She is  experiencing cough sore throat and headaches. She is unsure if she has fever but feels feverish. She states that she is immune suppressed . She states that she has not had a know exposure to someone positive.  She states that she feels her symptoms are getting worse.  She has had symptoms for about one week. Care advice read to patient. Pt verbalized understanding  Today she notes that she is feeling weak- started about 9 days ago but seems to be getting worse She is carrying groceries for herself and her parents, think she may be just doing too much She is wearing a mask when she goes out She has noted a cough off and on, but getting worse.  Triggered by eating or drinking something hot or cold Might be allergies- she has tried benadryl, just last night.  She is not currently using her Flonase or any nonsedating antihistamine She does not have a thermometer - tried to buy one but could not find 1 in the stores She feels "achy all over" Also some ST- "could be post nasal drip" She is taking OTC vitamin C and some sort of OTC immune booster with zinc  She does not feel in any distress Pt states that she "always feels sick" due to her chronic illnesses  as above but this seems a bit worse than her baseline Her mother has been ill with bacterial pneumonia, she is on antibiotics  She notes that her apt complex has "re-circulated air," she worries that she might be exposed to sick people and other apartments She is spraying lysol in her air vents  She is not drinking the water in her appt and is purchasing all of her drinking water.  She does not feel that the water in the town [place] is okay to drink  Patient Active Problem List   Diagnosis Date Noted  . Viral syndrome 05/23/2018  . GAD (generalized anxiety disorder) 04/22/2018  . ADD (attention deficit disorder) 04/22/2018  . Axillary lymphadenopathy 12/11/2016  . Lyme disease 12/11/2016  . Dyslipidemia 06/18/2016  . Essential  hypertension, benign 06/18/2016  . Acquired hypothyroidism 06/18/2016    Past Medical History:  Diagnosis Date  . Lyme disease   . Thyroid disease     Past Surgical History:  Procedure Laterality Date  . ABDOMINAL EXPLORATION SURGERY    . APPENDECTOMY    . MASTOIDECTOMY    . TONSILECTOMY/ADENOIDECTOMY WITH MYRINGOTOMY      Social History   Tobacco Use  . Smoking status: Never Smoker  . Smokeless tobacco: Never Used  Substance Use Topics  . Alcohol use: No  . Drug use: No    Family History  Problem Relation Age of Onset  . Hypertension Mother   . Heart disease Father   . Hypertension Sister   . Hypertension Brother   . Colon cancer Neg Hx     Allergies  Allergen Reactions  . Ceftriaxone Sodium In Dextrose Other (See Comments)    "heart races and cannot breathe"  . Guaifenesin & Derivatives Other (See Comments)    "heart races, dizziness, light-headedness"  . Meperidine Other (See Comments)    "heart races"  . Other Rash  . Penicillins Hives  . Pseudoephedrine Other (See Comments)    "heart races and high blood pressure"  . Rosuvastatin Calcium Nausea And Vomiting    Muscle aches   . Simvastatin Other (See Comments)    muscle aching   . Ciprofloxacin   . Rocephin [Ceftriaxone] Itching    Medication list has been reviewed and updated.  Current Outpatient Medications on File Prior to Visit  Medication Sig Dispense Refill  . albuterol (PROVENTIL HFA;VENTOLIN HFA) 108 (90 Base) MCG/ACT inhaler Inhale 1-2 puffs into the lungs every 6 (six) hours as needed. 1 Inhaler 0  . ALPRAZolam (XANAX) 0.5 MG tablet TAKE 1 TABLET BY MOUTH FIVE TIMES DAILY 150 tablet 2  . amoxicillin (AMOXIL) 875 MG tablet Take 1 tablet (875 mg total) by mouth 2 (two) times daily. 20 tablet 0  . ARMOUR THYROID 15 MG tablet TAKE 2 TABLETS(30 MG) BY MOUTH DAILY 180 tablet 1  . atenolol (TENORMIN) 25 MG tablet Take 1 tablet (25 mg total) by mouth 2 (two) times daily. 180 tablet 2  . Charcoal  200 MG CAPS Take by mouth every other day.    . charcoal activated, NO SORBITOL, (ACTIDOSE-AQUA) suspension Take 50 g by mouth once. 200 mg    . Cholecalciferol (VITAMIN D) 10 MCG/ML LIQD Take by mouth.    . cholestyramine (QUESTRAN) 4 GM/DOSE powder BEGIN WITH 1/4 TSP QOD. FOLLOW PROTOCOL  1  . DULoxetine (CYMBALTA) 60 MG capsule Take 1 capsule (60 mg total) by mouth daily. 90 capsule 1  . ELDERBERRY PO Take by mouth.    . fluticasone (  FLONASE) 50 MCG/ACT nasal spray Place 2 sprays into both nostrils daily. 16 g 1  . HYDROcodone-homatropine (HYCODAN) 5-1.5 MG/5ML syrup Take 5 mLs by mouth every 8 (eight) hours as needed for cough. 50 mL 0  . hydrOXYzine (ATARAX/VISTARIL) 25 MG tablet TAKE 1 TABLET(25 MG) BY MOUTH THREE TIMES DAILY AS NEEDED 30 tablet 3  . irbesartan (AVAPRO) 150 MG tablet Take 1 tablet (150 mg total) by mouth daily. 90 tablet 3  . pantoprazole (PROTONIX) 40 MG tablet Take 1 tablet (40 mg total) by mouth 2 (two) times daily. Take Protonix 40 mg BID for 8 weeks,take this medication 30 mins to 1 hour prior to morning and evening meals. Then decrease to once daily until return office appointment. 150 tablet 0  . PREBIOTIC PRODUCT PO Take by mouth. Mix in shake and drink once a day.    . Probiotic Product (PROBIOTIC DAILY PO) Take 1 capsule by mouth daily.    . promethazine (PHENERGAN) 12.5 MG tablet Take 1 tablet (12.5 mg total) by mouth every 8 (eight) hours as needed for nausea or vomiting. 12 tablet 0   No current facility-administered medications on file prior to visit.     Review of Systems:  As per HPI- otherwise negative.   Physical Examination: There were no vitals filed for this visit. There were no vitals filed for this visit. There is no height or weight on file to calculate BMI. Ideal Body Weight:    Pt observed over web video today Appears her normal self, no cough, wheezing, tachypnea is observed  Assessment and Plan: Other fatigue  Cough  Virtual  visit today due to COVID-19 outbreak.  Clark has symptoms of fatigue, cough, sore throat for about 9 days.  She does tend to get allergies this time of year.  I have advised her that while it is possible she has COVID-19, if she is feeling mildly ill she is best to stay home and recover there.  I did advise her that if she is in any distress or feels that she is not doing okay at home, she needs to proceed to the ER for further evaluation.  Otherwise I suggest that she start back on her Flonase, and that she try a nonsedating antihistamine during the day  Welcomed and answered all questions.  She will get back in touch with me if I can help  Signed Abbe Amsterdam, MD

## 2018-10-16 ENCOUNTER — Emergency Department (HOSPITAL_BASED_OUTPATIENT_CLINIC_OR_DEPARTMENT_OTHER)
Admission: EM | Admit: 2018-10-16 | Discharge: 2018-10-16 | Disposition: A | Discharge status: Home / Self Care | Payer: Self-pay | Attending: Emergency Medicine | Admitting: Emergency Medicine

## 2018-10-16 ENCOUNTER — Encounter (HOSPITAL_BASED_OUTPATIENT_CLINIC_OR_DEPARTMENT_OTHER): Payer: Self-pay | Admitting: Emergency Medicine

## 2018-10-16 ENCOUNTER — Other Ambulatory Visit: Payer: Self-pay

## 2018-10-16 DIAGNOSIS — I10 Essential (primary) hypertension: Secondary | ICD-10-CM | POA: Insufficient documentation

## 2018-10-16 DIAGNOSIS — E039 Hypothyroidism, unspecified: Secondary | ICD-10-CM | POA: Insufficient documentation

## 2018-10-16 DIAGNOSIS — A6920 Lyme disease, unspecified: Secondary | ICD-10-CM | POA: Insufficient documentation

## 2018-10-16 DIAGNOSIS — Z880 Allergy status to penicillin: Secondary | ICD-10-CM | POA: Insufficient documentation

## 2018-10-16 DIAGNOSIS — Z79899 Other long term (current) drug therapy: Secondary | ICD-10-CM | POA: Insufficient documentation

## 2018-10-16 DIAGNOSIS — B372 Candidiasis of skin and nail: Secondary | ICD-10-CM | POA: Insufficient documentation

## 2018-10-16 MED ORDER — CLOTRIMAZOLE 1 % EX CREA: TOPICAL_CREAM | CUTANEOUS | 0 refills | Status: DC

## 2018-10-16 NOTE — Discharge Instructions (Addendum)
Keep your skin clean and dry. Wear loose-fitting cotton clothing.  Apply the cream twice daily for 2 weeks.  Follow up with your primary care provider as needed.

## 2018-10-16 NOTE — ED Provider Notes (Signed)
[facility] EMERGENCY DEPARTMENT Provider Note   CSN: [id number] Arrival date & time: 10/16/18  1556    History   Chief Complaint Chief Complaint  Patient presents with  . Groin Pain    HPI Jenna Arroyo is a 53 y.o. female w PMHx lyme dz, thyroid dz, HTN, GAD, presenting to the ED with complaint of rash to her left groin that she noticed today. She states rash is slightly painful, but more uncomfortable with palpation. It does not itch. She describes it as red. She has had a similar rash in the past underneath her right breast. No urinary sx, no pelvic complaints, no fever, no abd pain. No interventions tried for symptoms. No hx of diabetes. She did take an antibiotic 1 month ago for sore throat.     The history is provided by the patient.    Past Medical History:  Diagnosis Date  . Lyme disease   . Thyroid disease     Patient Active Problem List   Diagnosis Date Noted  . Viral syndrome 05/23/2018  . GAD (generalized anxiety disorder) 04/22/2018  . ADD (attention deficit disorder) 04/22/2018  . Axillary lymphadenopathy 12/11/2016  . Lyme disease 12/11/2016  . Dyslipidemia 06/18/2016  . Essential hypertension, benign 06/18/2016  . Acquired hypothyroidism 06/18/2016    Past Surgical History:  Procedure Laterality Date  . ABDOMINAL EXPLORATION SURGERY    . APPENDECTOMY    . MASTOIDECTOMY    . TONSILECTOMY/ADENOIDECTOMY WITH MYRINGOTOMY       OB History   No obstetric history on file.      Home Medications    Prior to Admission medications   Medication Sig Start Date End Date Taking? Authorizing Provider  albuterol (PROVENTIL HFA;VENTOLIN HFA) 108 (90 Base) MCG/ACT inhaler Inhale 1-2 puffs into the lungs every 6 (six) hours as needed. 06/14/18   Nche, Bonna Gains, NP  ALPRAZolam Prudy Feeler) 0.5 MG tablet TAKE 1 TABLET BY MOUTH FIVE TIMES DAILY 09/02/18   Cottle, Steva Ready., MD  amoxicillin (AMOXIL) 875 MG tablet Take 1 tablet (875 mg total) by mouth 2  (two) times daily. 09/09/18   Lowne Chase, Grayling Congress, DO  ARMOUR THYROID 15 MG tablet TAKE 2 TABLETS(30 MG) BY MOUTH DAILY 10/04/18   Copland, Gwenlyn Found, MD  atenolol (TENORMIN) 25 MG tablet Take 1 tablet (25 mg total) by mouth 2 (two) times daily. 09/02/18   Cottle, Steva Ready., MD  Charcoal 200 MG CAPS Take by mouth every other day.    [provider]  charcoal activated, NO SORBITOL, (ACTIDOSE-AQUA) suspension Take 50 g by mouth once. 200 mg    [provider]  Cholecalciferol (VITAMIN D) 10 MCG/ML LIQD Take by mouth.    [provider]  cholestyramine (QUESTRAN) 4 GM/DOSE powder BEGIN WITH 1/4 TSP QOD. FOLLOW PROTOCOL 01/12/18   [provider]  clotrimazole (LOTRIMIN) 1 % cream Apply to affected area 2 times daily for 2 weeks 10/16/18   Robinson, [place] N, PA-C  DULoxetine (CYMBALTA) 60 MG capsule Take 1 capsule (60 mg total) by mouth daily. 09/16/18   Cottle, Steva Ready., MD  ELDERBERRY PO Take by mouth.    [provider]  fluticasone (FLONASE) 50 MCG/ACT nasal spray Place 2 sprays into both nostrils daily. 08/02/17   Saguier, [name], PA-C  HYDROcodone-homatropine (HYCODAN) 5-1.5 MG/5ML syrup Take 5 mLs by mouth every 8 (eight) hours as needed for cough. 06/14/18   Nche, Bonna Gains, NP  hydrOXYzine (ATARAX/VISTARIL) 25 MG  tablet TAKE 1 TABLET(25 MG) BY MOUTH THREE TIMES DAILY AS NEEDED 09/16/18   Cottle, Steva Readyarey G Jr., MD  irbesartan (AVAPRO) 150 MG tablet Take 1 tablet (150 mg total) by mouth daily. 05/24/18   Copland, Gwenlyn FoundJessica C, MD  pantoprazole (PROTONIX) 40 MG tablet Take 1 tablet (40 mg total) by mouth 2 (two) times daily. Take Protonix 40 mg BID for 8 weeks,take this medication 30 mins to 1 hour prior to morning and evening meals. Then decrease to once daily until return office appointment. 03/29/18   Cirigliano, Vito V, DO  PREBIOTIC PRODUCT PO Take by mouth. Mix in shake and drink once a day.    [provider]  Probiotic Product (PROBIOTIC  DAILY PO) Take 1 capsule by mouth daily.    [provider]  promethazine (PHENERGAN) 12.5 MG tablet Take 1 tablet (12.5 mg total) by mouth every 8 (eight) hours as needed for nausea or vomiting. 06/07/18   Wanda PlumpPaz, Jose E, MD    Family History Family History  Problem Relation Age of Onset  . Hypertension Mother   . Heart disease Father   . Hypertension Sister   . Hypertension Brother   . Colon cancer Neg Hx     Social History Social History   Tobacco Use  . Smoking status: Never Smoker  . Smokeless tobacco: Never Used  Substance Use Topics  . Alcohol use: No  . Drug use: No     Allergies   Ceftriaxone sodium in dextrose; Guaifenesin & derivatives; Meperidine; Other; Penicillins; Pseudoephedrine; Rosuvastatin calcium; Simvastatin; Ciprofloxacin; and Rocephin [ceftriaxone]   Review of Systems Review of Systems  Constitutional: Negative for fever.  Gastrointestinal: Negative for abdominal pain.  Genitourinary: Negative for dysuria, pelvic pain, vaginal bleeding, vaginal discharge and vaginal pain.  Skin: Positive for rash.     Physical Exam Updated Vital Signs BP (!) 157/98 (BP Location: Left Arm)   Pulse (!) 58   Temp 98.1 F (36.7 C) (Oral)   Resp 18   Ht 5\' 6"  (1.676 m)   Wt 102.1 kg   SpO2 100%   BMI 36.33 kg/m   Physical Exam Vitals signs and nursing note reviewed.  Constitutional:      General: She is not in acute distress.    Appearance: She is well-developed. She is not ill-appearing.  HENT:     Head: Normocephalic and atraumatic.  Eyes:     Conjunctiva/sclera: Conjunctivae normal.  Cardiovascular:     Rate and Rhythm: Normal rate.  Pulmonary:     Effort: Pulmonary effort is normal.  Skin:    Comments: Exam performed with female NT chaperone present. Left groin with erythematous, moist appearing maculopapular rash with well-defined raised border. Rash is about 3-4cm in diameter.  No vesicles or purulence. No streaking.  Adjacent to this  there are a few hair follicles with inflammation, nonvesicular.  Neurological:     Mental Status: She is alert.  Psychiatric:        Mood and Affect: Mood normal.        Behavior: Behavior normal.      ED Treatments / Results  Labs (all labs ordered are listed, but only abnormal results are displayed) Labs Reviewed - No data to display  EKG None  Radiology No results found.  Procedures Procedures (including critical care time)  Medications Ordered in ED Medications - No data to display   Initial Impression / Assessment and Plan / ED Course  I have reviewed the triage vital signs  and the nursing notes.  Pertinent labs & imaging results that were available during my care of the patient were reviewed by me and considered in my medical decision making (see chart for details).        Pt with rash in left groin consistent with candidal infection. Pt with recent antibiotic use last month. No hx of diabetes. Rash is not consistent with herpes virus. Less likely bacterial given appearance and location. Will rx topical antifungal and recommend pt keep skin dry and wear loose-fitting cotton clothing. PCP follow as needed. Safe for discharge.  Discussed results, findings, treatment and follow up. Patient advised of return precautions. Patient verbalized understanding and agreed with plan.  Final Clinical Impressions(s) / ED Diagnoses   Final diagnoses:  Candidiasis of skin    ED Discharge Orders         Ordered    clotrimazole (LOTRIMIN) 1 % cream     10/16/18 1629           Robinson, [place] N, [place] 10/16/18 1643    Maia Plan, MD 10/16/18 1955

## 2018-10-16 NOTE — ED Triage Notes (Signed)
Pt reports pain to left groin area from underwear.

## 2018-11-08 ENCOUNTER — Telehealth: Payer: Self-pay | Admitting: Neurology

## 2018-11-08 NOTE — Telephone Encounter (Signed)
We have attempted to call the patient 2 times to schedule sleep study. Patient has been unavailable at the phone numbers we have on file and has not returned our calls. At this point we will send a letter asking pt to please contact the sleep lab to schedule their sleep study. If patient calls back we will schedule them for their sleep study. ° °

## 2018-11-14 ENCOUNTER — Telehealth: Payer: Self-pay | Admitting: Family Medicine

## 2018-11-14 NOTE — Telephone Encounter (Signed)
Generic levothyroxine- synthetic thyroid hormone- would be the most cost effective option for her If she likes I can change her over There is not per my knowledge a generic pig thyroid (like she is taking now)

## 2018-11-14 NOTE — Telephone Encounter (Signed)
Copied from CRM [id number]. Topic: Quick Communication - See Telephone Encounter >> Nov 14, 2018  1:33 PM Louie Bun, Rosey Bath D wrote: CRM for notification. See Telephone encounter for: 11/14/18. Patient called and would like to speak with Dr. Patsy Lager or her CMA about her medication ARMOUR THYROID 15 MG tablet and if there is a generic for it. Patient said that medication is too much for her. Also if there is a different doze or medication she can take that is the same or similar. Please call patient back, thanks.

## 2018-11-29 ENCOUNTER — Ambulatory Visit: Payer: Self-pay | Admitting: *Deleted

## 2018-11-29 ENCOUNTER — Encounter: Payer: Self-pay | Admitting: Medical

## 2018-11-29 ENCOUNTER — Ambulatory Visit (INDEPENDENT_AMBULATORY_CARE_PROVIDER_SITE_OTHER): Payer: Self-pay | Admitting: Medical

## 2018-11-29 ENCOUNTER — Telehealth: Payer: Self-pay | Admitting: Family Medicine

## 2018-11-29 VITALS — BP 126/92 | HR 82 | Ht 66.0 in

## 2018-11-29 DIAGNOSIS — J301 Allergic rhinitis due to pollen: Secondary | ICD-10-CM

## 2018-11-29 DIAGNOSIS — J40 Bronchitis, not specified as acute or chronic: Secondary | ICD-10-CM

## 2018-11-29 DIAGNOSIS — J4520 Mild intermittent asthma, uncomplicated: Secondary | ICD-10-CM

## 2018-11-29 DIAGNOSIS — R05 Cough: Secondary | ICD-10-CM

## 2018-11-29 DIAGNOSIS — R059 Cough, unspecified: Secondary | ICD-10-CM

## 2018-11-29 MED ORDER — AZITHROMYCIN 250 MG PO TABS: ORAL_TABLET | ORAL | 0 refills | Status: DC

## 2018-11-29 MED ORDER — MONTELUKAST SODIUM 10 MG PO TABS: 10.0000 mg | ORAL_TABLET | Freq: Every day | ORAL | 0 refills | Status: DC

## 2018-11-29 MED ORDER — FLUTICASONE PROPIONATE HFA 110 MCG/ACT IN AERO: 2.0000 | INHALATION_SPRAY | Freq: Two times a day (BID) | RESPIRATORY_TRACT | 12 refills | Status: DC

## 2018-11-29 NOTE — Patient Instructions (Addendum)
Pt has recent cough which appears likely related to allergies and reactive airways. Some concern for bronchitis as well.  Advised continue zyrtec and added montelukast. For recent cough and mild wheeze added flovent and advised use albuterol if needed. Rx albuterol sent to pt pharmacy for bronchitis.  Pt desires to get covid testing. Gave options on locations. [facility] as she lives in [place]. Note not real suspicious for covid. So directed to center that can get drive through testing.  Follow up 7-10 days or as needed

## 2018-11-29 NOTE — Progress Notes (Signed)
Subjective:    Patient ID: Jenna Arroyo, female    DOB: 05-12-1966, 53 y.o.   MRN: [id number]  HPI  Virtual Visit via Video Note  I connected with [name] on 11/29/18 at  3:40 PM EDT by a video enabled telemedicine application and verified that I am speaking with the correct person using two identifiers.  Location: Patient: home/apartment. She is tyring to move. Provider: home   I discussed the limitations of evaluation and management by telemedicine and the availability of in person appointments. The patient expressed understanding and agreed to proceed.  History of Present Illness:  Pt states she has had hx of cough in November. Describes that cough resovled but returned over past 2 weeks.. Worse cough just recently. Pt mom had pneumonia just recently. Pt mom has not been tested for pneumonia. Pt state cough is dry/not productive. Pt neighbors in her apartment smoke so she thinks this might effect her cough. She states recently feeling extremely tired. She recently watched her grandkids but wore mask entire time. Pt has albuterol that she uses in event wheezes.  Pt states felt warm today she felt subjective warmth and chillls. Temp of 98.8 is high for  Her.  Pt also states some fatigue just recently.  Mild gasy/bloated stomach. No diarrhea. Avoiding gluten. She had no diarrhea.    Pt does have some allergy history. She is on zyrtec. She is reluctant to be on new med such as singulair.  Pt expresses concern for covid. Desires to be tested.   Observations/Objective: General-no acute distress, pleasant, oriented. Lungs- on inspection lungs appear unlabored. Neck- no tracheal deviation or jvd on inspection. Neuro- gross motor function appears intact.  Assessment and Plan: Pt has recent cough which appears likely related to allergies and reactive airways. Some concern for bronchitis as well.  Advised continue zyrtec and added montelukast. For recent cough and mild wheeze  added flovent and advised use albuterol if needed. Rx albuterol sent to pt pharmacy for bronchitis.  Pt desires to get covid testing. Gave options on locations. [facility] most convenient [facility] as she lives in [place].  Follow up 7-10 days or as needed  25 minutes spent with pt. 50% of time spent on treatment for diagnosis as well as discussion on covid testing.  Follow Up Instructions:    I discussed the assessment and treatment plan with the patient. The patient was provided an opportunity to ask questions and all were answered. The patient agreed with the plan and demonstrated an understanding of the instructions.   The patient was advised to call back or seek an in-person evaluation if the symptoms worsen or if the condition fails to improve as anticipated.  25 minutes spent with pt. 50%    Esperanza Richters, PA-C   Review of Systems  Constitutional: Negative for chills, fatigue and fever.  HENT: Negative for congestion and ear pain.   Respiratory: Positive for cough and wheezing. Negative for apnea and chest tightness.        One time lying down heard mild wheezing.  Pt has mold allergies in past. Also she is sensitive to smoke if exposed.  She does have albuterol at home.  Cardiovascular: Negative for chest pain and palpitations.  Gastrointestinal: Negative for abdominal pain.  Genitourinary: Negative for dysuria.  Neurological: Negative for dizziness and headaches.  Hematological: Negative for adenopathy. Does not bruise/bleed easily.   Past Medical History:  Diagnosis Date  . Lyme disease   .  Thyroid disease      Social History   Socioeconomic History  . Marital status: Divorced    Spouse name: Not on file  . Number of children: Not on file  . Years of education: Not on file  . Highest education level: Not on file  Occupational History  . Not on file  Social Needs  . Financial resource strain: Not on file  . Food insecurity:    Worry: Not  on file    Inability: Not on file  . Transportation needs:    Medical: Not on file    Non-medical: Not on file  Tobacco Use  . Smoking status: Never Smoker  . Smokeless tobacco: Never Used  Substance and Sexual Activity  . Alcohol use: No  . Drug use: No  . Sexual activity: Not Currently  Lifestyle  . Physical activity:    Days per week: Not on file    Minutes per session: Not on file  . Stress: Not on file  Relationships  . Social connections:    Talks on phone: Not on file    Gets together: Not on file    Attends religious service: Not on file    Active member of club or organization: Not on file    Attends meetings of clubs or organizations: Not on file    Relationship status: Not on file  . Intimate partner violence:    Fear of current or ex partner: Not on file    Emotionally abused: Not on file    Physically abused: Not on file    Forced sexual activity: Not on file  Other Topics Concern  . Not on file  Social History Narrative  . Not on file    Past Surgical History:  Procedure Laterality Date  . ABDOMINAL EXPLORATION SURGERY    . APPENDECTOMY    . MASTOIDECTOMY    . TONSILECTOMY/ADENOIDECTOMY WITH MYRINGOTOMY      Family History  Problem Relation Age of Onset  . Hypertension Mother   . Heart disease Father   . Hypertension Sister   . Hypertension Brother   . Colon cancer Neg Hx     Allergies  Allergen Reactions  . Ceftriaxone Sodium In Dextrose Other (See Comments)    "heart races and cannot breathe"  . Guaifenesin & Derivatives Other (See Comments)    "heart races, dizziness, light-headedness"  . Meperidine Other (See Comments)    "heart races"  . Other Rash  . Penicillins Hives  . Pseudoephedrine Other (See Comments)    "heart races and high blood pressure"  . Rosuvastatin Calcium Nausea And Vomiting    Muscle aches   . Simvastatin Other (See Comments)    muscle aching   . Ciprofloxacin   . Rocephin [Ceftriaxone] Itching    Current  Outpatient Medications on File Prior to Visit  Medication Sig Dispense Refill  . albuterol (PROVENTIL HFA;VENTOLIN HFA) 108 (90 Base) MCG/ACT inhaler Inhale 1-2 puffs into the lungs every 6 (six) hours as needed. 1 Inhaler 0  . ALPRAZolam (XANAX) 0.5 MG tablet TAKE 1 TABLET BY MOUTH FIVE TIMES DAILY 150 tablet 2  . ARMOUR THYROID 15 MG tablet TAKE 2 TABLETS(30 MG) BY MOUTH DAILY 180 tablet 1  . atenolol (TENORMIN) 25 MG tablet Take 1 tablet (25 mg total) by mouth 2 (two) times daily. 180 tablet 2  . Charcoal 200 MG CAPS Take by mouth every other day.    . charcoal activated, NO SORBITOL, (ACTIDOSE-AQUA) suspension Take 50  g by mouth once. 200 mg    . Cholecalciferol (VITAMIN D) 10 MCG/ML LIQD Take by mouth.    . cholestyramine (QUESTRAN) 4 GM/DOSE powder BEGIN WITH 1/4 TSP QOD. FOLLOW PROTOCOL  1  . clotrimazole (LOTRIMIN) 1 % cream Apply to affected area 2 times daily for 2 weeks 15 g 0  . DULoxetine (CYMBALTA) 60 MG capsule Take 1 capsule (60 mg total) by mouth daily. 90 capsule 1  . ELDERBERRY PO Take by mouth.    . fluticasone (FLONASE) 50 MCG/ACT nasal spray Place 2 sprays into both nostrils daily. 16 g 1  . hydrOXYzine (ATARAX/VISTARIL) 25 MG tablet TAKE 1 TABLET(25 MG) BY MOUTH THREE TIMES DAILY AS NEEDED 30 tablet 3  . irbesartan (AVAPRO) 150 MG tablet Take 1 tablet (150 mg total) by mouth daily. 90 tablet 3  . pantoprazole (PROTONIX) 40 MG tablet Take 1 tablet (40 mg total) by mouth 2 (two) times daily. Take Protonix 40 mg BID for 8 weeks,take this medication 30 mins to 1 hour prior to morning and evening meals. Then decrease to once daily until return office appointment. 150 tablet 0  . PREBIOTIC PRODUCT PO Take by mouth. Mix in shake and drink once a day.    . Probiotic Product (PROBIOTIC DAILY PO) Take 1 capsule by mouth daily.     No current facility-administered medications on file prior to visit.     BP (!) 126/92   Pulse 82   Ht 5\' 6"  (1.676 m)   BMI 36.33 kg/m        Objective:   Physical Exam        Assessment & Plan:

## 2018-11-29 NOTE — Telephone Encounter (Signed)
Pt called stating that she had a cough since November 2019, but went away and came back over the pt 2 weeks; she also complains of fatigue over the past 2 weeks; the pt is concerned that she may have COVID; recommendations made per nurse triage protocol; she verbalizes understanding;  Attempted to connect pt with Sherri scheduling at [facility] scheduling; phone error dropped call; unable to contact either party; the pt can be contacted at [phone]; will route to office for scheduling. Reason for Disposition . HIGH RISK patient (e.g., age > 64 years, diabetes, heart or lung disease, weak immune system)  Answer Assessment - Initial Assessment Questions 1. COVID-19 DIAGNOSIS: "Who made your Coronavirus (COVID-19) diagnosis?" "Was it confirmed by a positive lab test?" If not diagnosed by a HCP, ask "Are there lots of cases (community spread) where you live?" (See public health department website, if unsure)   * MAJOR community spread: high number of cases; numbers of cases are increasing; many people hospitalized.   * MINOR community spread: low number of cases; not increasing; few or no people hospitalized     major 2. ONSET: "When did the COVID-19 symptoms start?"      11/17/2018 3. WORST SYMPTOM: "What is your worst symptom?" (e.g., cough, fever, shortness of breath, muscle aches)  cough 4. COUGH: "Do you have a cough?" If so, ask: "How bad is the cough?"       "elevated" Moderate  5. FEVER: "Do you have a fever?" If so, ask: "What is your temperature, how was it measured, and when did it start?"     98.7 oral digital oral thermometer (normally 97.6) 6. RESPIRATORY STATUS: "Describe your breathing?" (e.g., shortness of breath, wheezing, unable to speak)      No only unless "really moving around"which is normal for her 7. BETTER-SAME-WORSE: "Are you getting better, staying the same or getting worse compared to yesterday?"  If getting worse, ask, "In what way?"   The same 8. HIGH RISK  DISEASE: "Do you have any chronic medical problems?" (e.g., asthma, heart or lung disease, weak immune system, etc.)     chronic lyme disease, hashimoto 9. PREGNANCY: "Is there any chance you are pregnant?" "When was your last menstrual period?"     No post menopausal 10. OTHER SYMPTOMS: "Do you have any other symptoms?"  (e.g., runny nose, headache, sore throat, loss of smell)       Headache, sore throat (thought due taking a lot of vitamins)  Protocols used: CORONAVIRUS (COVID-19) DIAGNOSED OR SUSPECTED-A-AH

## 2018-11-29 NOTE — Telephone Encounter (Signed)
Copied from CRM [id number]. Topic: General - Other >> Nov 29, 2018  4:32 PM Tamela Oddi wrote: Reason for CRM: Patient called to request a referral from Dr. Alvira Monday .  Please send in order for a CV Test for patient and call patient when it has been placed.  CB# [phone]

## 2018-11-29 NOTE — Telephone Encounter (Signed)
Copied from CRM [id number]. Topic: General - Other >> Nov 29, 2018  4:24 PM Rica Koyanagi, Barbee Cough wrote: Reason for CRM:pt says she was told to [facility] to get [facility]ona test.  She did, and was triaged.  They said she needed test, but she needs a paper orders.  Please advise

## 2018-11-30 ENCOUNTER — Telehealth: Payer: Self-pay | Admitting: *Deleted

## 2018-11-30 ENCOUNTER — Other Ambulatory Visit: Payer: Self-pay

## 2018-11-30 DIAGNOSIS — Z20822 Contact with and (suspected) exposure to covid-19: Secondary | ICD-10-CM

## 2018-11-30 NOTE — Telephone Encounter (Signed)
Patient called back- patient scheduled after call to office

## 2018-11-30 NOTE — Telephone Encounter (Signed)
Would you call patient and advise her to call [phone]. Let her know these are [facility]. The urgent care I directed her to appears to have changed there procedure.  Person she talks to will put in order for nasal swab test  She need to tell them she wants testing and has cough, fever, chills. They can direct her to [facility].

## 2018-11-30 NOTE — Telephone Encounter (Signed)
Left message for pt to call back. Okay for [facility] to give information.  

## 2018-11-30 NOTE — Telephone Encounter (Signed)
Would you call patient and advise her to call [phone]. Let her know these are [facility]. The urgent care I directed her to appears to have changed there procedure.  Person she talks to will put in order for nasal swab test  She need to tell them she wants testing and has cough, fever, chills. They can direct her to [facility]  Patient returned call- she needs testing- call to office they OK me to schedule her.

## 2018-11-30 NOTE — Telephone Encounter (Signed)
Pt was seen by Saguier on 11-29-2018. Done

## 2018-12-02 LAB — NOVEL CORONAVIRUS, NAA: SARS-CoV-2, NAA: NOT DETECTED

## 2018-12-05 ENCOUNTER — Encounter (HOSPITAL_BASED_OUTPATIENT_CLINIC_OR_DEPARTMENT_OTHER): Payer: Self-pay | Admitting: *Deleted

## 2018-12-05 ENCOUNTER — Emergency Department (HOSPITAL_BASED_OUTPATIENT_CLINIC_OR_DEPARTMENT_OTHER)
Admission: EM | Admit: 2018-12-05 | Discharge: 2018-12-05 | Disposition: A | Discharge status: Home / Self Care | Payer: Self-pay | Attending: Emergency Medicine | Admitting: Emergency Medicine

## 2018-12-05 ENCOUNTER — Other Ambulatory Visit: Payer: Self-pay

## 2018-12-05 ENCOUNTER — Ambulatory Visit (INDEPENDENT_AMBULATORY_CARE_PROVIDER_SITE_OTHER): Payer: Self-pay | Admitting: Family Medicine

## 2018-12-05 ENCOUNTER — Encounter: Payer: Self-pay | Admitting: Family Medicine

## 2018-12-05 VITALS — BP 130/95 | HR 90 | Temp 98.3°F | Ht 66.0 in | Wt 227.0 lb

## 2018-12-05 DIAGNOSIS — E039 Hypothyroidism, unspecified: Secondary | ICD-10-CM | POA: Insufficient documentation

## 2018-12-05 DIAGNOSIS — I10 Essential (primary) hypertension: Secondary | ICD-10-CM

## 2018-12-05 DIAGNOSIS — A6920 Lyme disease, unspecified: Secondary | ICD-10-CM | POA: Insufficient documentation

## 2018-12-05 NOTE — ED Provider Notes (Signed)
[facility] EMERGENCY DEPARTMENT Provider Note   CSN: [id number] Arrival date & time: 12/05/18  2041    History   Chief Complaint Chief Complaint  Patient presents with  . Hypertension    HPI Jenna Arroyo is a 53 y.o. female.     Patient is a 53 year old female with a history of chronic Lyme disease, hypertension, ADD, generalized anxiety and hypothyroidism who is presenting today with persistently elevated blood pressure.  Patient actually saw her PCP earlier today because she was having elevated blood pressure over the last few days.  Yesterday she did take 1 of her medications late and had a salty meal so took an extra half of her ibesartan.  Blood pressure in the office today was in the 140s over 100s.  Patient had blood work done in January that showed renal function.  Patient states that she has not missed any other doses of her medication.  Tonight she was on the phone when she started to get a headache and felt generally not well.  She denied any chest pain or shortness of breath but felt like her heart was racing.  She checked her blood pressure and it was 170/110 so she took an extra half of her ibesartan.  When she rechecked it it was still 145/107 and she was concerned so she came here for further care.  Currently she states she is starting to feel better.  The history is provided by the patient.  Hypertension  This is a recurrent problem. The current episode started 1 to 2 hours ago. The problem occurs constantly. The problem has been gradually improving. Associated symptoms include headaches. Pertinent negatives include no chest pain, no abdominal pain and no shortness of breath. Associated symptoms comments: General malaise and palpitations. Nothing aggravates the symptoms. Nothing relieves the symptoms. Treatments tried: took an extra  of ibasartan. The treatment provided mild relief.    Past Medical History:  Diagnosis Date  . Lyme disease   . Thyroid  disease     Patient Active Problem List   Diagnosis Date Noted  . Viral syndrome 05/23/2018  . GAD (generalized anxiety disorder) 04/22/2018  . ADD (attention deficit disorder) 04/22/2018  . Axillary lymphadenopathy 12/11/2016  . Lyme disease 12/11/2016  . Dyslipidemia 06/18/2016  . Essential hypertension, benign 06/18/2016  . Acquired hypothyroidism 06/18/2016    Past Surgical History:  Procedure Laterality Date  . ABDOMINAL EXPLORATION SURGERY    . APPENDECTOMY    . MASTOIDECTOMY    . TONSILECTOMY/ADENOIDECTOMY WITH MYRINGOTOMY       OB History   No obstetric history on file.      Home Medications    Prior to Admission medications   Medication Sig Start Date End Date Taking? Authorizing Provider  albuterol (PROVENTIL HFA;VENTOLIN HFA) 108 (90 Base) MCG/ACT inhaler Inhale 1-2 puffs into the lungs every 6 (six) hours as needed. 06/14/18   Nche, Bonna Gains, NP  ALPRAZolam Prudy Feeler) 0.5 MG tablet TAKE 1 TABLET BY MOUTH FIVE TIMES DAILY 09/02/18   Cottle, Steva Ready., MD  ARMOUR THYROID 15 MG tablet TAKE 2 TABLETS(30 MG) BY MOUTH DAILY 10/04/18   Copland, Gwenlyn Found, MD  atenolol (TENORMIN) 25 MG tablet Take 1 tablet (25 mg total) by mouth 2 (two) times daily. 09/02/18   Cottle, Steva Ready., MD  azithromycin (ZITHROMAX) 250 MG tablet Take 2 tablets by mouth on day 1, followed by 1 tablet by mouth daily for 4 days. 11/29/18   Saguier, [name],  PA-C  Charcoal 200 MG CAPS Take by mouth every other day.    [provider]  charcoal activated, NO SORBITOL, (ACTIDOSE-AQUA) suspension Take 50 g by mouth once. 200 mg    [provider]  Cholecalciferol (VITAMIN D) 10 MCG/ML LIQD Take by mouth.    [provider]  cholestyramine (QUESTRAN) 4 GM/DOSE powder BEGIN WITH 1/4 TSP QOD. FOLLOW PROTOCOL 01/12/18   [provider]  clotrimazole (LOTRIMIN) 1 % cream Apply to affected area 2 times daily for 2 weeks 10/16/18   Robinson, [place] N, PA-C  DULoxetine  (CYMBALTA) 60 MG capsule Take 1 capsule (60 mg total) by mouth daily. 09/16/18   Cottle, Steva Ready., MD  ELDERBERRY PO Take by mouth.    [provider]  fluticasone (FLONASE) 50 MCG/ACT nasal spray Place 2 sprays into both nostrils daily. 08/02/17   Saguier, [name], PA-C  fluticasone (FLOVENT HFA) 110 MCG/ACT inhaler Inhale 2 puffs into the lungs 2 (two) times daily. 11/29/18   Saguier, [name], PA-C  hydrOXYzine (ATARAX/VISTARIL) 25 MG tablet TAKE 1 TABLET(25 MG) BY MOUTH THREE TIMES DAILY AS NEEDED 09/16/18   Cottle, Steva Ready., MD  irbesartan (AVAPRO) 150 MG tablet Take 1 tablet (150 mg total) by mouth daily. 05/24/18   Copland, Gwenlyn Found, MD  montelukast (SINGULAIR) 10 MG tablet Take 1 tablet (10 mg total) by mouth at bedtime. 11/29/18   Saguier, [name], PA-C  pantoprazole (PROTONIX) 40 MG tablet Take 1 tablet (40 mg total) by mouth 2 (two) times daily. Take Protonix 40 mg BID for 8 weeks,take this medication 30 mins to 1 hour prior to morning and evening meals. Then decrease to once daily until return office appointment. 03/29/18   Cirigliano, Vito V, DO  PREBIOTIC PRODUCT PO Take by mouth. Mix in shake and drink once a day.    [provider]  Probiotic Product (PROBIOTIC DAILY PO) Take 1 capsule by mouth daily.    [provider]    Family History Family History  Problem Relation Age of Onset  . Hypertension Mother   . Heart disease Father   . Hypertension Sister   . Hypertension Brother   . Colon cancer Neg Hx     Social History Social History   Tobacco Use  . Smoking status: Never Smoker  . Smokeless tobacco: Never Used  Substance Use Topics  . Alcohol use: No  . Drug use: No     Allergies   Ceftriaxone sodium in dextrose; Guaifenesin & derivatives; Meperidine; Other; Penicillins; Pseudoephedrine; Rosuvastatin calcium; Simvastatin; Ciprofloxacin; and Rocephin [ceftriaxone]   Review of Systems Review of Systems  Respiratory: Negative for  shortness of breath.   Cardiovascular: Negative for chest pain.  Gastrointestinal: Negative for abdominal pain.  Neurological: Positive for headaches.  All other systems reviewed and are negative.    Physical Exam Updated Vital Signs BP (!) 134/101   Pulse 98   Temp 97.9 F (36.6 C) (Oral)   Resp 20   Ht 5' 6.5" (1.689 m)   Wt 102.9 kg   SpO2 99%   BMI 36.07 kg/m   Physical Exam Vitals signs and nursing note reviewed.  Constitutional:      General: She is not in acute distress.    Appearance: She is well-developed.  HENT:     Head: Normocephalic and atraumatic.  Eyes:     Pupils: Pupils are equal, round, and reactive to light.  Cardiovascular:     Rate and Rhythm: Normal rate and  regular rhythm.     Heart sounds: Normal heart sounds. No murmur. No friction rub.  Pulmonary:     Effort: Pulmonary effort is normal.     Breath sounds: Normal breath sounds. No wheezing or rales.  Musculoskeletal: Normal range of motion.        General: No tenderness.     Comments: No edema  Skin:    General: Skin is warm and dry.     Findings: No rash.  Neurological:     General: No focal deficit present.     Mental Status: She is alert and oriented to person, place, and time. Mental status is at baseline.     Cranial Nerves: No cranial nerve deficit.  Psychiatric:        Mood and Affect: Mood normal.        Behavior: Behavior normal.        Thought Content: Thought content normal.      ED Treatments / Results  Labs (all labs ordered are listed, but only abnormal results are displayed) Labs Reviewed - No data to display  EKG None  Radiology No results found.  Procedures Procedures (including critical care time)  Medications Ordered in ED Medications - No data to display   Initial Impression / Assessment and Plan / ED Course  I have reviewed the triage vital signs and the nursing notes.  Pertinent labs & imaging results that were available during my care of the  patient were reviewed by me and considered in my medical decision making (see chart for details).        Patient presenting today for hypertension.  Patient saw PCP today for the same and they recommended she take an additional half of her Iva Sartain if her blood pressure remained elevated.  Patient started having a headache tonight and checked her blood pressure which was elevated so took an additional ibesartan but after a few minutes her blood pressure remained elevated and she got worried.  Here patient's blood pressure is 134/101.  This is approximately 30 minutes after taking the blood pressure medication.  Discussed with patient that it takes a week or more of taking the same dose of medication to really notice a persistent effect.  She is not complaining of any symptoms concerning for endorgan damage such as visual changes, unilateral weakness or numbness, speech difficulty, chest pain or shortness of breath.  Patient has normal temperature and oxygen saturation with no evidence of tachycardia or dysrhythmia.  Patient was encouraged to continue checking at home and writing down her numbers as well as avoiding salty meals and getting exercise.  Patient is also under a lot of stress right now because of recently losing her job because of COVID as well as losing her insurance.  Today's Vitals   12/05/18 2046 12/05/18 2049 12/05/18 2132  BP:  (!) 134/101 131/75  Pulse:  98 84  Resp:  20 18  Temp:  97.9 F (36.6 C)   TempSrc:  Oral   SpO2:  99% 99%  Weight: 102.9 kg    Height: 5' 6.5" (1.689 m)    PainSc: 5   2    Body mass index is 36.07 kg/m.      Final Clinical Impressions(s) / ED Diagnoses   Final diagnoses:  Essential hypertension    ED Discharge Orders    None       Gwyneth SproutPlunkett, Braedon Sjogren, MD 12/05/18 2139

## 2018-12-05 NOTE — ED Triage Notes (Signed)
States her BP has been elevated. She saw her MD today for same and was told to take her BP in the am and pm. States it remains above 140.

## 2018-12-05 NOTE — Patient Instructions (Signed)
Please check your BP in the monring and in the evening If your BP reading is higher than 145/95 you can take an extra 75 mg (1/2 tablet) of irbesartan  ONCE per day Continue atenolol twice a day    Please let me know how your BP does over the next couple of weeks.  I can change your rx if you end up needing extra irbersartan on a regular basis

## 2018-12-05 NOTE — Progress Notes (Signed)
[facility] [address], [address] [place], [place] [address] [phone]  Date:  12/05/2018   Name:  Jenna Arroyo   DOB:  [birth year]   MRN:  [id number]  PCP:  Pearline Cables, MD    Chief Complaint: Blood Pressure Check   History of Present Illness:  Jenna Arroyo is a 53 y.o. very pleasant female patient who presents with the following:  In person visit today for concern of BP She was seen in the office by [name] last week and had BP reading as below. BP Readings from Last 3 Encounters:  12/05/18 (!) 130/95  11/29/18 (!) 126/92  10/16/18 (!) 157/98   She had a negative COVID test last week -she was not feeling well at that time, but is now feeling better  Current blood pressure medications are: Atenolol 25 BID  irbersartan 150 daily   She prefers to continue taking her current thyroid medication despite price as it seems to work the best for her.  She did unfortunately lose her job, during the current pandemic  She was traveling some yesterday and took her BP med late, ate a salty meal.  She thinks this is why her blood pressure was high Her BP last night was 150/113.  This am it was about 150/100- better on recheck however  She took a second half pill of irbersartan last night -this seemed to help lower her blood pressure  She is under a lot of stress right now due to various factors-pandemic, job loss, current social unrest (her son is a Emergency planning/management officer)  She is not having any CP or SOB She does feel tired, otherwise no particular symptoms She is not getting quite as much exercise as usual due to pandemic  She was seen in the ER back in January for CP and had an EKG and CXR  Patient Active Problem List   Diagnosis Date Noted  . Viral syndrome 05/23/2018  . GAD (generalized anxiety disorder) 04/22/2018  . ADD (attention deficit disorder) 04/22/2018  . Axillary lymphadenopathy 12/11/2016  . Lyme disease 12/11/2016   . Dyslipidemia 06/18/2016  . Essential hypertension, benign 06/18/2016  . Acquired hypothyroidism 06/18/2016    Past Medical History:  Diagnosis Date  . Lyme disease   . Thyroid disease     Past Surgical History:  Procedure Laterality Date  . ABDOMINAL EXPLORATION SURGERY    . APPENDECTOMY    . MASTOIDECTOMY    . TONSILECTOMY/ADENOIDECTOMY WITH MYRINGOTOMY      Social History   Tobacco Use  . Smoking status: Never Smoker  . Smokeless tobacco: Never Used  Substance Use Topics  . Alcohol use: No  . Drug use: No    Family History  Problem Relation Age of Onset  . Hypertension Mother   . Heart disease Father   . Hypertension Sister   . Hypertension Brother   . Colon cancer Neg Hx     Allergies  Allergen Reactions  . Ceftriaxone Sodium In Dextrose Other (See Comments)    "heart races and cannot breathe"  . Guaifenesin & Derivatives Other (See Comments)    "heart races, dizziness, light-headedness"  . Meperidine Other (See Comments)    "heart races"  . Other Rash  . Penicillins Hives  . Pseudoephedrine Other (See Comments)    "heart races and high blood pressure"  . Rosuvastatin Calcium Nausea And Vomiting    Muscle aches   . Simvastatin  Other (See Comments)    muscle aching   . Ciprofloxacin   . Rocephin [Ceftriaxone] Itching    Medication list has been reviewed and updated.  Current Outpatient Medications on File Prior to Visit  Medication Sig Dispense Refill  . albuterol (PROVENTIL HFA;VENTOLIN HFA) 108 (90 Base) MCG/ACT inhaler Inhale 1-2 puffs into the lungs every 6 (six) hours as needed. 1 Inhaler 0  . ALPRAZolam (XANAX) 0.5 MG tablet TAKE 1 TABLET BY MOUTH FIVE TIMES DAILY 150 tablet 2  . ARMOUR THYROID 15 MG tablet TAKE 2 TABLETS(30 MG) BY MOUTH DAILY 180 tablet 1  . atenolol (TENORMIN) 25 MG tablet Take 1 tablet (25 mg total) by mouth 2 (two) times daily. 180 tablet 2  . azithromycin (ZITHROMAX) 250 MG tablet Take 2 tablets by mouth on day 1,  followed by 1 tablet by mouth daily for 4 days. 6 tablet 0  . Charcoal 200 MG CAPS Take by mouth every other day.    . charcoal activated, NO SORBITOL, (ACTIDOSE-AQUA) suspension Take 50 g by mouth once. 200 mg    . Cholecalciferol (VITAMIN D) 10 MCG/ML LIQD Take by mouth.    . cholestyramine (QUESTRAN) 4 GM/DOSE powder BEGIN WITH 1/4 TSP QOD. FOLLOW PROTOCOL  1  . clotrimazole (LOTRIMIN) 1 % cream Apply to affected area 2 times daily for 2 weeks 15 g 0  . DULoxetine (CYMBALTA) 60 MG capsule Take 1 capsule (60 mg total) by mouth daily. 90 capsule 1  . ELDERBERRY PO Take by mouth.    . fluticasone (FLONASE) 50 MCG/ACT nasal spray Place 2 sprays into both nostrils daily. 16 g 1  . fluticasone (FLOVENT HFA) 110 MCG/ACT inhaler Inhale 2 puffs into the lungs 2 (two) times daily. 1 Inhaler 12  . hydrOXYzine (ATARAX/VISTARIL) 25 MG tablet TAKE 1 TABLET(25 MG) BY MOUTH THREE TIMES DAILY AS NEEDED 30 tablet 3  . irbesartan (AVAPRO) 150 MG tablet Take 1 tablet (150 mg total) by mouth daily. 90 tablet 3  . montelukast (SINGULAIR) 10 MG tablet Take 1 tablet (10 mg total) by mouth at bedtime. 30 tablet 0  . pantoprazole (PROTONIX) 40 MG tablet Take 1 tablet (40 mg total) by mouth 2 (two) times daily. Take Protonix 40 mg BID for 8 weeks,take this medication 30 mins to 1 hour prior to morning and evening meals. Then decrease to once daily until return office appointment. 150 tablet 0  . PREBIOTIC PRODUCT PO Take by mouth. Mix in shake and drink once a day.    . Probiotic Product (PROBIOTIC DAILY PO) Take 1 capsule by mouth daily.     No current facility-administered medications on file prior to visit.     Review of Systems:  As per HPI- otherwise negative.   Physical Examination: Vitals:   12/05/18 1553 12/05/18 1609  BP: (!) 142/92 (!) 130/95  Pulse: 90   Temp: 98.3 F (36.8 C)   SpO2: 100%    Vitals:   12/05/18 1553  Weight: 227 lb (103 kg)  Height:  (1.676 m)   Body mass index is  36.64 kg/m. Ideal Body Weight: Weight in (lb) to have BMI = 25: 154.6  GEN: WDWN, NAD, Non-toxic, A & O x 3, obese, looks well HEENT: Atraumatic, Normocephalic. Neck supple. No masses, No LAD. Ears and Nose: No external deformity. CV: RRR, No M/G/R. No JVD. No thrill. No extra heart sounds. PULM: CTA B, no wheezes, crackles, rhonchi. No retractions. No resp. distress. No accessory muscle use. EXTR:  No c/c/e NEURO Normal gait.  PSYCH: Normally interactive. Conversant. Not depressed or anxious appearing.  Calm demeanor.     Wt Readings from Last 3 Encounters:  12/05/18 227 lb (103 kg)  10/16/18 225 lb 1.4 oz (102.1 kg)  09/09/18 225 lb (102.1 kg)    Assessment and Plan:  Essential hypertension, benign  Acquired hypothyroidism  Following up today on essential hypertension.  She has noticed a few slightly higher than normal blood pressure readings.  She typically takes 150 mg of irbersartan.  She took an extra half tablet last night, her blood pressure looks better today.  I have asked her to monitor her blood pressure twice a day for the next couple of weeks, if her pressures are high, she may take an extra 75 mg of irbersartan.  She will let me know how her blood pressure looks-if she needs the extra medication regularly I will change her prescription.  I am not able to get labs right now, as our lab is closed for the day  Continue current dose of thyroid medication Signed Abbe AmsterdamJessica Geriann Lafont, MD

## 2018-12-05 NOTE — Discharge Instructions (Addendum)
Usually it takes about 1-2 weeks at a higher blood pressure medication dose to see how it will affect you.  Continue to monitor at home and keep Dr. Patsy Lager informed.  If you develop vision changes, weakness, numbness or trouble walking return immediately

## 2018-12-05 NOTE — ED Notes (Signed)
Pt. Reports she is here due to her B/P being up today and she felt light headed and dizzy today.  Pt. Now  Here after waiting a while and now doing better.

## 2018-12-09 ENCOUNTER — Ambulatory Visit: Payer: Self-pay

## 2018-12-09 NOTE — Telephone Encounter (Signed)
Incoming call from Patient reporting that her blood Pressure 111/82 HR99 136/87 HR91  Patient voices concern over resting Hr. Of 99and 91.  Obtained with automatic machine. Denies missing any medication,  Request a return phone call.

## 2018-12-09 NOTE — Telephone Encounter (Signed)
   Answer Assessment - Initial Assessment Questions 1. BLOOD PRESSURE: "What is the blood pressure?" "Did you take at least two measurements 5 minutes apart?"     *No Answer*111/82,  Hr 99, 136/87 HR91 2. ONSET: "When did you take your blood pressure?"     Today  3. HOW: "How did you obtain the blood pressure?" (e.g., visiting nurse, automatic home BP monitor)     automatic 4. HISTORY: "Do you have a history of low blood pressure?" "What is your blood pressure normally?"     Yes  5. MEDICATIONS: "Are you taking any medications for blood pressure?" If yes: "Have they been changed recently?"     denies 6. PULSE RATE: "Do you know what your pulse rate is?"     91 7. OTHER SYMPTOMS: "Have you been sick recently?" "Have you had a recent injury?"     Denies 8. PREGNANCY: "Is there any chance you are pregnant?" "When was your last menstrual period?"     na  Protocols used: LOW BLOOD PRESSURE-A-AH

## 2018-12-12 NOTE — Telephone Encounter (Signed)
Please advise 

## 2018-12-12 NOTE — Telephone Encounter (Signed)
Called pt back - had to [facility].  Advised that a pulse in the 90s is not overly concerning, but if it continues please let me know.    I last saw her on 6/1 for HTN and adjusted her medication slightly, and then she visited the ER later that same day for mildly elevated BP BP Readings from Last 3 Encounters:  12/05/18 131/75  12/05/18 (!) 130/95  11/29/18 (!) 126/92   Pulse Readings from Last 3 Encounters:  12/05/18 84  12/05/18 90  11/29/18 82

## 2019-01-13 ENCOUNTER — Telehealth: Payer: Self-pay | Admitting: Medical

## 2019-01-13 ENCOUNTER — Encounter: Payer: Self-pay | Admitting: Medical

## 2019-01-13 ENCOUNTER — Ambulatory Visit (INDEPENDENT_AMBULATORY_CARE_PROVIDER_SITE_OTHER): Payer: Self-pay | Admitting: Medical

## 2019-01-13 ENCOUNTER — Other Ambulatory Visit: Payer: Self-pay

## 2019-01-13 VITALS — BP 120/87 | Temp 98.7°F | Ht 66.5 in

## 2019-01-13 DIAGNOSIS — M791 Myalgia, unspecified site: Secondary | ICD-10-CM

## 2019-01-13 DIAGNOSIS — J029 Acute pharyngitis, unspecified: Secondary | ICD-10-CM

## 2019-01-13 DIAGNOSIS — R1110 Vomiting, unspecified: Secondary | ICD-10-CM

## 2019-01-13 DIAGNOSIS — Z20822 Contact with and (suspected) exposure to covid-19: Secondary | ICD-10-CM

## 2019-01-13 DIAGNOSIS — J3089 Other allergic rhinitis: Secondary | ICD-10-CM

## 2019-01-13 MED ORDER — MONTELUKAST SODIUM 10 MG PO TABS: 10.0000 mg | ORAL_TABLET | Freq: Every day | ORAL | 0 refills | Status: DC

## 2019-01-13 MED ORDER — ONDANSETRON 4 MG PO TBDP: 4.0000 mg | ORAL_TABLET | Freq: Three times a day (TID) | ORAL | 0 refills | Status: DC | PRN

## 2019-01-13 MED ORDER — AZELASTINE HCL 0.1 % NA SOLN: 2.0000 | Freq: Two times a day (BID) | NASAL | 1 refills | Status: DC

## 2019-01-13 NOTE — Progress Notes (Signed)
Subjective:    Patient ID: Jenna Arroyo, female    DOB: 1966/03/04, 53 y.o.   MRN: [id number]  HPI   Virtual Visit via Telephone Note  I connected with Jenna Arroyo on 01/13/19 at  2:40 PM EDT by telephone and verified that I am speaking with the correct person using two identifiers.  Location: Patient: home Provider: home   I discussed the limitations, risks, security and privacy concerns of performing an evaluation and management service by telephone and the availability of in person appointments. I also discussed with the patient that there may be a patient responsible charge related to this service. The patient expressed understanding and agreed to proceed.      HPI. Pt states she has been recently busy moving and traveling back to [place].  Pt has been taking herbal supplement since told she has chronic lyme disease sometimes. She takes that in past and it does cause some gi symptoms in past.   Pt had recently low grade fever of 98.7. Her temp is usually lower. Some ha recently, slight st, mild cough, vomited 3 times other night but no loose stools. Pt does not have a lot drainage. She does have allergies. Some sneezing recently. Rare intemittent cough.But not productive.   Pt in past used flonase and it burns her nose. Pt never had astelin.Pt has zyrtec in the past. Has singulair  Some myaglia 2 days ago.Some lower eye lid pressure.Those symptoms now reolvie.  Review of Systems  Constitutional: Negative for chills, fatigue and fever.  HENT: Negative for congestion, drooling, ear pain, facial swelling and mouth sores.   Respiratory: Positive for cough. Negative for chest tightness, shortness of breath and wheezing.   Cardiovascular: Negative for chest pain and palpitations.  Gastrointestinal: Negative for abdominal pain and constipation.  Musculoskeletal: Positive for myalgias. Negative for back pain.  Skin: Negative for rash.  Hematological: Negative for adenopathy.  Does not bruise/bleed easily.  Psychiatric/Behavioral: Negative for behavioral problems and sleep disturbance. The patient is not nervous/anxious.     Past Medical History:  Diagnosis Date  . Lyme disease   . Thyroid disease      Social History   Socioeconomic History  . Marital status: Divorced    Spouse name: Not on file  . Number of children: Not on file  . Years of education: Not on file  . Highest education level: Not on file  Occupational History  . Not on file  Social Needs  . Financial resource strain: Not on file  . Food insecurity    Worry: Not on file    Inability: Not on file  . Transportation needs    Medical: Not on file    Non-medical: Not on file  Tobacco Use  . Smoking status: Never Smoker  . Smokeless tobacco: Never Used  Substance and Sexual Activity  . Alcohol use: No  . Drug use: No  . Sexual activity: Not Currently  Lifestyle  . Physical activity    Days per week: Not on file    Minutes per session: Not on file  . Stress: Not on file  Relationships  . Social Herbalist on phone: Not on file    Gets together: Not on file    Attends religious service: Not on file    Active member of club or organization: Not on file    Attends meetings of clubs or organizations: Not on file    Relationship status: Not on file  .  Intimate partner violence    Fear of current or ex partner: Not on file    Emotionally abused: Not on file    Physically abused: Not on file    Forced sexual activity: Not on file  Other Topics Concern  . Not on file  Social History Narrative  . Not on file    Past Surgical History:  Procedure Laterality Date  . ABDOMINAL EXPLORATION SURGERY    . APPENDECTOMY    . MASTOIDECTOMY    . TONSILECTOMY/ADENOIDECTOMY WITH MYRINGOTOMY      Family History  Problem Relation Age of Onset  . Hypertension Mother   . Heart disease Father   . Hypertension Sister   . Hypertension Brother   . Colon cancer Neg Hx      Allergies  Allergen Reactions  . Ceftriaxone Sodium In Dextrose Other (See Comments)    "heart races and cannot breathe"  . Guaifenesin & Derivatives Other (See Comments)    "heart races, dizziness, light-headedness"  . Meperidine Other (See Comments)    "heart races"  . Other Rash  . Penicillins Hives  . Pseudoephedrine Other (See Comments)    "heart races and high blood pressure"  . Rosuvastatin Calcium Nausea And Vomiting    Muscle aches   . Simvastatin Other (See Comments)    muscle aching   . Ciprofloxacin   . Rocephin [Ceftriaxone] Itching    Current Outpatient Medications on File Prior to Visit  Medication Sig Dispense Refill  . albuterol (PROVENTIL HFA;VENTOLIN HFA) 108 (90 Base) MCG/ACT inhaler Inhale 1-2 puffs into the lungs every 6 (six) hours as needed. 1 Inhaler 0  . ALPRAZolam (XANAX) 0.5 MG tablet TAKE 1 TABLET BY MOUTH FIVE TIMES DAILY 150 tablet 2  . ARMOUR THYROID 15 MG tablet TAKE 2 TABLETS(30 MG) BY MOUTH DAILY 180 tablet 1  . atenolol (TENORMIN) 25 MG tablet Take 1 tablet (25 mg total) by mouth 2 (two) times daily. 180 tablet 2  . Charcoal 200 MG CAPS Take by mouth every other day.    . charcoal activated, NO SORBITOL, (ACTIDOSE-AQUA) suspension Take 50 g by mouth once. 200 mg    . Cholecalciferol (VITAMIN D) 10 MCG/ML LIQD Take by mouth.    . cholestyramine (QUESTRAN) 4 GM/DOSE powder BEGIN WITH 1/4 TSP QOD. FOLLOW PROTOCOL  1  . clotrimazole (LOTRIMIN) 1 % cream Apply to affected area 2 times daily for 2 weeks 15 g 0  . DULoxetine (CYMBALTA) 60 MG capsule Take 1 capsule (60 mg total) by mouth daily. 90 capsule 1  . ELDERBERRY PO Take by mouth.    . fluticasone (FLONASE) 50 MCG/ACT nasal spray Place 2 sprays into both nostrils daily. 16 g 1  . fluticasone (FLOVENT HFA) 110 MCG/ACT inhaler Inhale 2 puffs into the lungs 2 (two) times daily. 1 Inhaler 12  . hydrOXYzine (ATARAX/VISTARIL) 25 MG tablet TAKE 1 TABLET(25 MG) BY MOUTH THREE TIMES DAILY AS NEEDED 30  tablet 3  . irbesartan (AVAPRO) 150 MG tablet Take 1 tablet (150 mg total) by mouth daily. 90 tablet 3  . montelukast (SINGULAIR) 10 MG tablet Take 1 tablet (10 mg total) by mouth at bedtime. 30 tablet 0  . pantoprazole (PROTONIX) 40 MG tablet Take 1 tablet (40 mg total) by mouth 2 (two) times daily. Take Protonix 40 mg BID for 8 weeks,take this medication 30 mins to 1 hour prior to morning and evening meals. Then decrease to once daily until return office appointment. 150 tablet 0  .  PREBIOTIC PRODUCT PO Take by mouth. Mix in shake and drink once a day.    . Probiotic Product (PROBIOTIC DAILY PO) Take 1 capsule by mouth daily.    Marland Kitchen. azithromycin (ZITHROMAX) 250 MG tablet Take 2 tablets by mouth on day 1, followed by 1 tablet by mouth daily for 4 days. (Patient not taking: Reported on 01/13/2019) 6 tablet 0   No current facility-administered medications on file prior to visit.     BP 120/87   Temp 98.7 F (37.1 C)   Ht 5' 6.5" (1.689 m)   BMI 36.07 kg/m      Assessment & Plan:  You do have some symptoms that may represent covid infection. I put in request to get you tested on Monday. You described recent ha, bodyaches, slight st and mild cough cause for caution. Please update me if your signs and symptoms change or worsen.  If you have recurrent nausea or vomiting then us zofran.  You do have allergic rhinitis so recommend stay on zyrtec. Also prescribing astelin nasal spray and montelukast.  Recommend stay at home pending results of covid test results. Will prepare work note excuse and ask staff to fax to her home or mail on Monday.  Follow up in 7 days or as needed     I  discussed the assessment and treatment plan with the patient. The patient was provided an opportunity to ask questions and all were answered. The patient agreed with the plan and demonstrated an understanding of the instructions.   The patient was advised to call back or seek an in-person evaluation if the  symptoms worsen or if the condition fails to improve as anticipated.  I provided 25  minutes of non-face-to-face time during this encounter.  Esperanza RichtersEdward Jadier Rockers, PA-C

## 2019-01-13 NOTE — Telephone Encounter (Signed)
Opened to review 

## 2019-01-13 NOTE — Telephone Encounter (Signed)
Would you please call pt on Monday and let her know that I wrote work excuse note for her to use until she improves clinically and pending result of covid test. Please call her and notify her. Fax note if necessary or mail.   Let me know that she needs to stay at home and self monitor as note explains.

## 2019-01-13 NOTE — Addendum Note (Signed)
Addended by: Marin Roberts on: 01/13/2019 03:34 PM   Modules accepted: Orders

## 2019-01-13 NOTE — Telephone Encounter (Signed)
Contacted pt and pt agreed to testing. She was scheduled for Monday at the [facility] testing site at 10:45. Pt is aware to remain in her car and to wear a mask. Pt is aware that results could take up to 3-7 days. Pt understood and had no additional questions at this time. Nothing further is needed   Lab was ordered.

## 2019-01-13 NOTE — Patient Instructions (Addendum)
You do have some symptoms that may represent covid infection. I put in request to get you tested on Monday. You described recent ha, bodyaches, slight st and mild cough cause for caution. Please update me if your signs and symptoms change or worsen.  If you have recurrent nausea or vomiting then [place] zofran.  You do have allergic rhinitis so recommend stay on zyrtec. Also prescribing astelin nasal spray and montelukast.  Recommend stay at home pending results of covid test results. Will prepare work note excuse and ask staff to fax to her home or mail on Monday.  Follow up in 7 days or as needed

## 2019-01-13 NOTE — Telephone Encounter (Signed)
Recent low grade fever, ha, myalgia and vomiting. Concern for covid. Will you please  get patient scheduled for covid test on Monday.

## 2019-01-16 ENCOUNTER — Other Ambulatory Visit: Payer: Self-pay

## 2019-01-16 DIAGNOSIS — Z20822 Contact with and (suspected) exposure to covid-19: Secondary | ICD-10-CM

## 2019-01-16 MED ORDER — HYDROXYZINE HCL 25 MG PO TABS: ORAL_TABLET | ORAL | 3 refills | Status: DC

## 2019-01-16 MED ORDER — PROMETHAZINE HCL 12.5 MG PO TABS: 12.5000 mg | ORAL_TABLET | Freq: Three times a day (TID) | ORAL | 0 refills | Status: DC | PRN

## 2019-01-16 NOTE — Addendum Note (Signed)
Addended by: Anabel Halon on: 01/16/2019 08:24 AM   Modules accepted: Orders

## 2019-01-16 NOTE — Telephone Encounter (Signed)
Notified pt. 

## 2019-01-16 NOTE — Telephone Encounter (Signed)
Ok thanks for update. Will prescribe phenergan. Zofran cost $100.

## 2019-01-16 NOTE — Telephone Encounter (Signed)
Notified pt. She does not need work note at this time as she doesn't start new job until after July. Pt states zofran was $100 and she cannot afford this. She would like a cheaper alternative if possible. Please advise.

## 2019-01-20 LAB — NOVEL CORONAVIRUS, NAA: SARS-CoV-2, NAA: NOT DETECTED

## 2019-01-23 ENCOUNTER — Other Ambulatory Visit: Payer: Self-pay

## 2019-01-23 ENCOUNTER — Encounter: Payer: Self-pay | Admitting: Psychiatry

## 2019-01-23 ENCOUNTER — Ambulatory Visit (INDEPENDENT_AMBULATORY_CARE_PROVIDER_SITE_OTHER): Payer: Self-pay | Admitting: Psychiatry

## 2019-01-23 DIAGNOSIS — F411 Generalized anxiety disorder: Secondary | ICD-10-CM

## 2019-01-23 DIAGNOSIS — F900 Attention-deficit hyperactivity disorder, predominantly inattentive type: Secondary | ICD-10-CM

## 2019-01-23 DIAGNOSIS — Z8659 Personal history of other mental and behavioral disorders: Secondary | ICD-10-CM | POA: Insufficient documentation

## 2019-01-23 MED ORDER — DULOXETINE HCL 60 MG PO CPEP: 60.0000 mg | ORAL_CAPSULE | Freq: Every day | ORAL | 1 refills | Status: DC

## 2019-01-23 MED ORDER — ALPRAZOLAM 0.5 MG PO TABS: 0.5000 mg | ORAL_TABLET | Freq: Every day | ORAL | 5 refills | Status: DC

## 2019-01-23 NOTE — Progress Notes (Signed)
Jenna Arroyo [id number] 11/24/1965 53 y.o.  Subjective:   Patient ID:  Jenna Arroyo is a 53 y.o. (DOB 10/17/1965) female.  Chief Complaint:  Chief Complaint  Patient presents with  . Follow-up    Medication Management  . Anxiety    Medication Management  . ADD    Medication Management    Anxiety Symptoms include nervous/anxious behavior. Patient reports no confusion, decreased concentration or suicidal ideas.     Jenna Arroyo presents to the office today for follow-up of anxiety and mood.  Lost job but starting another soon.  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  Seems like a nice place.  Does worry about health otherwise doing ok.  Living in apt.  Grief over ;the world sitiuation.  Health concerns go up and down with relapses with Lyme dz.  Migraine and sore joints and poor STM affected.  Also has Hashimotos..   Had to move out from parent's (pushced by siblings)  and has had Lyme 16 years and chronic health problems.  They don't understand how sick I am.  They picked out apt behind her back.  This increased her financial stress.  Limited $ for health issues.    Patient reports stable mood and denies depressed or irritable moods.  Patient denies any recent difficulty with anxiety.  sleep affected by smokers around her apt.  Will end in 9 days, moving. Denies appetite disturbance.  Patient reports that energy and motivation have been good.  Patient denies any difficulty with concentration.  Patient denies any suicidal ideation. No panic.  Anxiety manageable.  Using Xanax mostly TID.  On duloxetine.  Sleep better in the day.  Went to neuro and planned sleep study in 8 days.  No questions concerns re: meds.  Past Psychiatric Medication Trials:    Review of Systems:  Review of Systems  Constitutional: Positive for fatigue.       Low exercise tolerance  Gastrointestinal: Positive for vomiting.  Musculoskeletal: Positive for arthralgias and neck pain.  Neurological: Positive for headaches.  Negative for tremors and weakness.       Foggy headed.  Psychiatric/Behavioral: Positive for sleep disturbance. Negative for agitation, behavioral problems, confusion, decreased concentration, dysphoric mood, hallucinations, self-injury and suicidal ideas. The patient is nervous/anxious. The patient is not hyperactive.     Medications: I have reviewed the patient's current medications.  Current Outpatient Medications  Medication Sig Dispense Refill  . albuterol (PROVENTIL HFA;VENTOLIN HFA) 108 (90 Base) MCG/ACT inhaler Inhale 1-2 puffs into the lungs every 6 (six) hours as needed. 1 Inhaler 0  . ALPRAZolam (XANAX) 0.5 MG tablet Take 1 tablet (0.5 mg total) by mouth 5 (five) times daily. 150 tablet 5  . ARMOUR THYROID 15 MG tablet TAKE 2 TABLETS(30 MG) BY MOUTH DAILY (Patient taking differently: Take 45 mg by mouth daily. ) 180 tablet 1  . atenolol (TENORMIN) 25 MG tablet Take 1 tablet (25 mg total) by mouth 2 (two) times daily. 180 tablet 2  . azelastine (ASTELIN) 0.1 % nasal spray Place 2 sprays into both nostrils 2 (two) times daily. Use in each nostril as directed 30 mL 1  . Cholecalciferol (VITAMIN D) 10 MCG/ML LIQD Take by mouth.    . cholestyramine (QUESTRAN) 4 GM/DOSE powder BEGIN WITH 1/4 TSP QOD. FOLLOW PROTOCOL  1  . clotrimazole (LOTRIMIN) 1 % cream Apply to affected area 2 times daily for 2 weeks 15 g 0  . DULoxetine (CYMBALTA) 60 MG capsule Take 1 capsule (60 mg total)  by mouth daily. 90 capsule 1  . ELDERBERRY PO Take by mouth.    . fluticasone (FLONASE) 50 MCG/ACT nasal spray Place 2 sprays into both nostrils daily. 16 g 1  . fluticasone (FLOVENT HFA) 110 MCG/ACT inhaler Inhale 2 puffs into the lungs 2 (two) times daily. 1 Inhaler 12  . hydrOXYzine (ATARAX/VISTARIL) 25 MG tablet TAKE 1 TABLET(25 MG) BY MOUTH THREE TIMES DAILY AS NEEDED 30 tablet 3  . irbesartan (AVAPRO) 150 MG tablet Take 1 tablet (150 mg total) by mouth daily. (Patient taking differently: Take 150 mg by mouth  daily. 1 tablet in the morning and 1/2 tablet in the evening) 90 tablet 3  . pantoprazole (PROTONIX) 40 MG tablet Take 1 tablet (40 mg total) by mouth 2 (two) times daily. Take Protonix 40 mg BID for 8 weeks,take this medication 30 mins to 1 hour prior to morning and evening meals. Then decrease to once daily until return office appointment. (Patient taking differently: Take 40 mg by mouth daily. ) 150 tablet 0  . PREBIOTIC PRODUCT PO Take by mouth. Mix in shake and drink once a day.    . Probiotic Product (PROBIOTIC DAILY PO) Take 1 capsule by mouth daily.    . promethazine (PHENERGAN) 12.5 MG tablet Take 1 tablet (12.5 mg total) by mouth every 8 (eight) hours as needed for nausea or vomiting. 20 tablet 0  . Charcoal 200 MG CAPS Take by mouth every other day.    . charcoal activated, NO SORBITOL, (ACTIDOSE-AQUA) suspension Take 50 g by mouth once. 200 mg    . montelukast (SINGULAIR) 10 MG tablet Take 1 tablet (10 mg total) by mouth at bedtime. (Patient not taking: Reported on 01/23/2019) 30 tablet 0   No current facility-administered medications for this visit.     Medication Side Effects: None  Allergies:  Allergies  Allergen Reactions  . Ceftriaxone Sodium In Dextrose Other (See Comments)    "heart races and cannot breathe"  . Guaifenesin & Derivatives Other (See Comments)    "heart races, dizziness, light-headedness"  . Meperidine Other (See Comments)    "heart races"  . Other Rash  . Penicillins Hives  . Pseudoephedrine Other (See Comments)    "heart races and high blood pressure"  . Rosuvastatin Calcium Nausea And Vomiting    Muscle aches   . Simvastatin Other (See Comments)    muscle aching   . Ciprofloxacin   . Rocephin [Ceftriaxone] Itching    Past Medical History:  Diagnosis Date  . Lyme disease   . Thyroid disease     Family History  Problem Relation Age of Onset  . Hypertension Mother   . Heart disease Father   . Hypertension Sister   . Hypertension Brother    . Colon cancer Neg Hx     Social History   Socioeconomic History  . Marital status: Divorced    Spouse name: Not on file  . Number of children: Not on file  . Years of education: Not on file  . Highest education level: Not on file  Occupational History  . Not on file  Social Needs  . Financial resource strain: Not on file  . Food insecurity    Worry: Not on file    Inability: Not on file  . Transportation needs    Medical: Not on file    Non-medical: Not on file  Tobacco Use  . Smoking status: Never Smoker  . Smokeless tobacco: Never Used  Substance and Sexual Activity  .  Alcohol use: No  . Drug use: No  . Sexual activity: Not Currently  Lifestyle  . Physical activity    Days per week: Not on file    Minutes per session: Not on file  . Stress: Not on file  Relationships  . Social Herbalist on phone: Not on file    Gets together: Not on file    Attends religious service: Not on file    Active member of club or organization: Not on file    Attends meetings of clubs or organizations: Not on file    Relationship status: Not on file  . Intimate partner violence    Fear of current or ex partner: Not on file    Emotionally abused: Not on file    Physically abused: Not on file    Forced sexual activity: Not on file  Other Topics Concern  . Not on file  Social History Narrative  . Not on file    Past Medical History, Surgical history, Social history, and Family history were reviewed and updated as appropriate.   Please see review of systems for further details on the patient's review from today.   Objective:   Physical Exam:  There were no vitals taken for this visit.  Physical Exam Constitutional:      General: She is not in acute distress.    Appearance: She is well-developed.  Musculoskeletal:        General: No deformity.  Neurological:     Mental Status: She is alert and oriented to person, place, and time.     Motor: No tremor.      Coordination: Coordination normal.     Gait: Gait normal.  Psychiatric:        Attention and Perception: Attention and perception normal.        Mood and Affect: Mood is anxious. Mood is not depressed. Affect is not labile, blunt, angry or inappropriate.        Speech: Speech normal.        Behavior: Behavior normal.        Thought Content: Thought content normal. Thought content does not include homicidal or suicidal ideation. Thought content does not include homicidal or suicidal plan.        Cognition and Memory: She exhibits impaired recent memory.        Judgment: Judgment normal.     Comments: Insight fair.  Somatic focus. No auditory or visual hallucinations. No delusions.      Lab Review:     Component Value Date/Time   NA 136 07/14/2018 1647   K 3.7 07/14/2018 1647   CL 100 07/14/2018 1647   CO2 29 07/14/2018 1647   GLUCOSE 96 07/14/2018 1647   BUN 17 07/14/2018 1647   CREATININE 0.81 07/14/2018 1647   CREATININE 0.90 12/11/2016 1613   CALCIUM 9.5 07/14/2018 1647   PROT 6.6 01/17/2018 1526   ALBUMIN 4.1 01/17/2018 1526   AST 24 01/17/2018 1526   ALT 50 (H) 01/17/2018 1526   ALKPHOS 91 01/17/2018 1526   BILITOT 0.6 01/17/2018 1526   GFRNONAA >60 07/14/2018 1647   GFRAA >60 07/14/2018 1647       Component Value Date/Time   WBC 10.5 07/14/2018 1647   RBC 4.81 07/14/2018 1647   HGB 14.6 07/14/2018 1647   HCT 43.4 07/14/2018 1647   PLT 387 07/14/2018 1647   MCV 90.2 07/14/2018 1647   MCH 30.4 07/14/2018 1647   MCHC 33.6 07/14/2018  1647   RDW 13.0 07/14/2018 1647   LYMPHSABS 1.9 03/02/2017 1301   MONOABS 0.4 03/02/2017 1301   EOSABS 0.3 03/02/2017 1301   BASOSABS 0.1 03/02/2017 1301    No results found for: POCLITH, LITHIUM   No results found for: PHENYTOIN, PHENOBARB, VALPROATE, CBMZ   .res Assessment: Plan:   Generalized anxiety disorder, manageable  Attention deficit hyperactivity disorder improved with duloxetine  History of panic attacks in  remission with current meds   No indication for med changes.  Disc somatic concerns.  We discussed the short-term risks associated with benzodiazepines including sedation and increased fall risk among others.  Discussed long-term side effect risk including dependence, potential withdrawal symptoms, and the potential eventual dose-related risk of dementia.  FU 6 mos  Meredith Staggersarey Cottle, MD, DFAPA   Please see After Visit Summary for patient specific instructions.  No future appointments.  No orders of the defined types were placed in this encounter.     -------------------------------

## 2019-02-01 ENCOUNTER — Ambulatory Visit: Payer: Self-pay | Admitting: *Deleted

## 2019-02-01 ENCOUNTER — Telehealth: Payer: Self-pay | Admitting: Family Medicine

## 2019-02-01 NOTE — Telephone Encounter (Signed)
Message from Ivar Drape sent at 02/01/2019 6:30 PM EDT  Patient stated that the left foot has swollen really big and now the right foot is swelling. Please advise.    Returned call to patient regarding the swelling in her feet. She stated that she had been on her feet moving to another house, eating fast foods and being in the sun. She denies shortness of breath, fever, pain in her feet or legs.  Advised to elevate her feet and try an ice pack. Avoid sodium and increase her fluids. She will try these things and any changes including shortness of breath, will go to the ED. She would like a call back regarding an appointment with her provider. Routing to the practice for review, recommendation and an appointment.  Reason for Disposition . Swollen ankle joint  (Exception: area of localized swelling which is itchy)  Answer Assessment - Initial Assessment Questions 1. LOCATION: "Which joint is swollen?"     Left foot 2. ONSET: "When did the swelling start?"     yesterday 3. SIZE: "How large is the swelling?"    Looks like a bad sprain 4. PAIN: "Is there any pain?" If so, ask: "How bad is it?" (Scale 1-10; or mild, moderate, severe)     No pain 5. CAUSE: "What do you think caused the swollen joint?"     Could be cardiac or the heat 6. OTHER SYMPTOMS: "Do you have any other symptoms?" (e.g., fever, chest pain, difficulty breathing, calf pain)     no 7. PREGNANCY: "Is there any chance you are pregnant?" "When was your last menstrual period?"     no  Protocols used: ANKLE SWELLING-A-AH

## 2019-02-02 ENCOUNTER — Other Ambulatory Visit: Payer: Self-pay

## 2019-02-02 ENCOUNTER — Ambulatory Visit (INDEPENDENT_AMBULATORY_CARE_PROVIDER_SITE_OTHER): Payer: Self-pay | Admitting: Family Medicine

## 2019-02-02 ENCOUNTER — Encounter: Payer: Self-pay | Admitting: Family Medicine

## 2019-02-02 VITALS — BP 128/88 | HR 96 | Temp 98.1°F | Resp 16 | Ht 66.5 in | Wt 224.0 lb

## 2019-02-02 DIAGNOSIS — M25473 Effusion, unspecified ankle: Secondary | ICD-10-CM

## 2019-02-02 NOTE — Patient Instructions (Addendum)
Let me know if you continue to have any swelling or tenderness in your legs or ankles - in that case we can go ahead and do an [place]

## 2019-02-02 NOTE — Progress Notes (Signed)
[facility] [address], [address] [place], [place] [address] [phone] [phone]  Date:  02/02/2019   Name:  Jenna GessLisa M Arroyo   DOB:  12/10/1965   MRN:  [id number]  PCP:  Pearline Cablesopland, Jessica C, MD    Chief Complaint: Joint Swelling (no known injury bilateral leg swelling, swelling has gone down, keeping foot elevated)   History of Present Illness:  Jenna Arroyo is a 53 y.o. very pleasant female patient who presents with the following:  Pt with history of HTN, hypothyroidism, anxiety and "chronic lyme" here today with concern of ankle swelling  She notes that yesterday her bilateral ankles were swollen- left more than right  She has been moving and has been on her feet a lot the last few days, eating more salty food than is normal for her However today her ankles actually look much better She tried to elevated her legs last night and this did seem to help a lot She is not having any pain in her legs or ankles  BP Readings from Last 3 Encounters:  02/02/19 128/88  01/13/19 120/87  12/05/18 131/75   Wt Readings from Last 3 Encounters:  02/02/19 224 lb (101.6 kg)  12/05/18 226 lb 13.7 oz (102.9 kg)  12/05/18 227 lb (103 kg)   No CP or SOB She has not noted this sort of swelling in the past She is taking her BP meds as prescribed  She has noted a bit of a cough but thinks this is due to black mold in her new place   Patient Active Problem List   Diagnosis Date Noted  . History of panic attacks 01/23/2019  . Viral syndrome 05/23/2018  . GAD (generalized anxiety disorder) 04/22/2018  . ADD (attention deficit disorder) 04/22/2018  . Axillary lymphadenopathy 12/11/2016  . Lyme disease 12/11/2016  . Dyslipidemia 06/18/2016  . Essential hypertension, benign 06/18/2016  . Acquired hypothyroidism 06/18/2016    Past Medical History:  Diagnosis Date  . Lyme disease   . Thyroid disease     Past Surgical History:  Procedure Laterality  Date  . ABDOMINAL EXPLORATION SURGERY    . APPENDECTOMY    . MASTOIDECTOMY    . TONSILECTOMY/ADENOIDECTOMY WITH MYRINGOTOMY      Social History   Tobacco Use  . Smoking status: Never Smoker  . Smokeless tobacco: Never Used  Substance Use Topics  . Alcohol use: No  . Drug use: No    Family History  Problem Relation Age of Onset  . Hypertension Mother   . Heart disease Father   . Hypertension Sister   . Hypertension Brother   . Colon cancer Neg Hx     Allergies  Allergen Reactions  . Ceftriaxone Sodium In Dextrose Other (See Comments)    "heart races and cannot breathe"  . Guaifenesin & Derivatives Other (See Comments)    "heart races, dizziness, light-headedness"  . Meperidine Other (See Comments)    "heart races"  . Other Rash  . Penicillins Hives  . Pseudoephedrine Other (See Comments)    "heart races and high blood pressure"  . Rosuvastatin Calcium Nausea And Vomiting    Muscle aches   . Simvastatin Other (See Comments)    muscle aching   . Ciprofloxacin   . Rocephin [Ceftriaxone] Itching    Medication list has been reviewed and updated.  Current Outpatient Medications on File Prior to Visit  Medication Sig Dispense Refill  . albuterol (  PROVENTIL HFA;VENTOLIN HFA) 108 (90 Base) MCG/ACT inhaler Inhale 1-2 puffs into the lungs every 6 (six) hours as needed. 1 Inhaler 0  . ALPRAZolam (XANAX) 0.5 MG tablet Take 1 tablet (0.5 mg total) by mouth 5 (five) times daily. 150 tablet 5  . ARMOUR THYROID 15 MG tablet TAKE 2 TABLETS(30 MG) BY MOUTH DAILY (Patient taking differently: Take 45 mg by mouth daily. ) 180 tablet 1  . atenolol (TENORMIN) 25 MG tablet Take 1 tablet (25 mg total) by mouth 2 (two) times daily. 180 tablet 2  . azelastine (ASTELIN) 0.1 % nasal spray Place 2 sprays into both nostrils 2 (two) times daily. Use in each nostril as directed 30 mL 1  . Charcoal 200 MG CAPS Take by mouth every other day.    . charcoal activated, NO SORBITOL, (ACTIDOSE-AQUA)  suspension Take 50 g by mouth once. 200 mg    . Cholecalciferol (VITAMIN D) 10 MCG/ML LIQD Take by mouth.    . cholestyramine (QUESTRAN) 4 GM/DOSE powder BEGIN WITH 1/4 TSP QOD. FOLLOW PROTOCOL  1  . clotrimazole (LOTRIMIN) 1 % cream Apply to affected area 2 times daily for 2 weeks 15 g 0  . DULoxetine (CYMBALTA) 60 MG capsule Take 1 capsule (60 mg total) by mouth daily. 90 capsule 1  . ELDERBERRY PO Take by mouth.    . fluticasone (FLONASE) 50 MCG/ACT nasal spray Place 2 sprays into both nostrils daily. 16 g 1  . fluticasone (FLOVENT HFA) 110 MCG/ACT inhaler Inhale 2 puffs into the lungs 2 (two) times daily. 1 Inhaler 12  . hydrOXYzine (ATARAX/VISTARIL) 25 MG tablet TAKE 1 TABLET(25 MG) BY MOUTH THREE TIMES DAILY AS NEEDED 30 tablet 3  . irbesartan (AVAPRO) 150 MG tablet Take 1 tablet (150 mg total) by mouth daily. (Patient taking differently: Take 150 mg by mouth daily. 1 tablet in the morning and 1/2 tablet in the evening) 90 tablet 3  . montelukast (SINGULAIR) 10 MG tablet Take 1 tablet (10 mg total) by mouth at bedtime. 30 tablet 0  . pantoprazole (PROTONIX) 40 MG tablet Take 1 tablet (40 mg total) by mouth 2 (two) times daily. Take Protonix 40 mg BID for 8 weeks,take this medication 30 mins to 1 hour prior to morning and evening meals. Then decrease to once daily until return office appointment. (Patient taking differently: Take 40 mg by mouth daily. ) 150 tablet 0  . PREBIOTIC PRODUCT PO Take by mouth. Mix in shake and drink once a day.    . Probiotic Product (PROBIOTIC DAILY PO) Take 1 capsule by mouth daily.    . promethazine (PHENERGAN) 12.5 MG tablet Take 1 tablet (12.5 mg total) by mouth every 8 (eight) hours as needed for nausea or vomiting. 20 tablet 0   No current facility-administered medications on file prior to visit.     Review of Systems:  As per HPI- otherwise negative.  No fever or chills Physical Examination: Vitals:   02/02/19 1604  BP: 128/88  Pulse: (!) 101   Resp: 16  Temp: 98.1 F (36.7 C)  SpO2: 98%   Vitals:   02/02/19 1604  Weight: 224 lb (101.6 kg)  Height: 5' 6.5" (1.689 m)   Body mass index is 35.61 kg/m. Ideal Body Weight: Weight in (lb) to have BMI = 25: 156.9  GEN: WDWN, NAD, Non-toxic, A & O x 3, obese, looks well  HEENT: Atraumatic, Normocephalic. Neck supple. No masses, No LAD. Ears and Nose: No external deformity. CV: RRR, No  M/G/R. No JVD. No thrill. No extra heart sounds. PULM: CTA B, no wheezes, crackles, rhonchi. No retractions. No resp. distress. No accessory muscle use. EXTR: No c/c/e.  No appreciable swelling in her ankles at this time She has some tenderness of her bilateral calves, anterior over the proximal shin NEURO Normal gait.  PSYCH: Normally interactive. Conversant. Not depressed or anxious appearing.  Calm demeanor.    Assessment and Plan:   ICD-10-CM   1. Ankle swelling, unspecified laterality  M25.473    Here today with concern of ankle swelling that occurred when she moved houses over the last 2 days Basically resolved today Offered an US to rule out DVT (which I think unlikely)- she declines for now due to cost  she will monitor her sx closely and let me know if sx come back, in that case can order an US if she likes   Follow-up: No follow-ups on file.  No orders of the defined types were placed in this encounter.  No orders of the defined types were placed in this encounter.   @SIGN @    Signed Abbe AmsterdamJessica Copland, MD

## 2019-03-17 ENCOUNTER — Telehealth: Payer: Self-pay | Admitting: Psychiatry

## 2019-03-17 NOTE — Telephone Encounter (Signed)
Pt left v-mail stated she is having a hard time remembering things. Just started new job and can't retain the info she is learning. Please advise

## 2019-03-20 NOTE — Telephone Encounter (Signed)
Left detailed message on personal voicemail of recommendation. Advised to call back with instructions and leave a pharmacy for ativan to be sent to

## 2019-03-20 NOTE — Telephone Encounter (Signed)
I am concerned that it could possibly be a side effect of the Xanax.  Ask if she would be willing to switch to Ativan which is a very similar medication but as a rule tends to have less cognitive side effect.  I did that for another patient that I am seeing today and she has had an significant improvement in her ability to concentrate and remember and learn new things.  We would switch it over the course of a week to Ativan 1 mg 3 times daily but for the first week she take half of each medication.

## 2019-03-27 ENCOUNTER — Other Ambulatory Visit: Payer: Self-pay

## 2019-03-27 DIAGNOSIS — I10 Essential (primary) hypertension: Secondary | ICD-10-CM

## 2019-03-27 MED ORDER — ATENOLOL 25 MG PO TABS: 25.0000 mg | ORAL_TABLET | Freq: Two times a day (BID) | ORAL | 2 refills | Status: DC

## 2019-03-28 ENCOUNTER — Other Ambulatory Visit: Payer: Self-pay | Admitting: Psychiatry

## 2019-03-28 ENCOUNTER — Telehealth: Payer: Self-pay | Admitting: Psychiatry

## 2019-03-28 DIAGNOSIS — Z8659 Personal history of other mental and behavioral disorders: Secondary | ICD-10-CM

## 2019-03-28 DIAGNOSIS — F411 Generalized anxiety disorder: Secondary | ICD-10-CM

## 2019-03-28 MED ORDER — ALPRAZOLAM 0.5 MG PO TABS: 0.5000 mg | ORAL_TABLET | Freq: Every day | ORAL | 5 refills | Status: DC

## 2019-03-28 NOTE — Telephone Encounter (Signed)
Sent in refills.  PDMP is clean

## 2019-03-28 NOTE — Telephone Encounter (Signed)
Patient need refill on Alprazolam to be sent to [facility] on [address] in [place], patient has appt, 10/14

## 2019-03-29 ENCOUNTER — Other Ambulatory Visit: Payer: Self-pay

## 2019-03-29 DIAGNOSIS — Z8659 Personal history of other mental and behavioral disorders: Secondary | ICD-10-CM

## 2019-03-29 DIAGNOSIS — F411 Generalized anxiety disorder: Secondary | ICD-10-CM

## 2019-03-29 MED ORDER — ALPRAZOLAM 0.5 MG PO TABS: 0.5000 mg | ORAL_TABLET | Freq: Every day | ORAL | 5 refills | Status: DC

## 2019-04-02 ENCOUNTER — Emergency Department
Admission: EM | Admit: 2019-04-02 | Discharge: 2019-04-02 | Disposition: A | Discharge status: Home / Self Care | Payer: Self-pay | Source: Home / Self Care | Attending: Family Medicine | Admitting: Family Medicine

## 2019-04-02 ENCOUNTER — Other Ambulatory Visit: Payer: Self-pay

## 2019-04-02 ENCOUNTER — Encounter: Payer: Self-pay | Admitting: Emergency Medicine

## 2019-04-02 DIAGNOSIS — B9789 Other viral agents as the cause of diseases classified elsewhere: Secondary | ICD-10-CM

## 2019-04-02 DIAGNOSIS — J069 Acute upper respiratory infection, unspecified: Secondary | ICD-10-CM

## 2019-04-02 HISTORY — DX: Essential (primary) hypertension: I10

## 2019-04-02 HISTORY — DX: Anxiety disorder, unspecified: F41.9

## 2019-04-02 LAB — POCT RAPID STREP A (OFFICE): Rapid Strep A Screen: NEGATIVE

## 2019-04-02 NOTE — Discharge Instructions (Addendum)
Stay well hydrated.  Get adequate rest.   Recommend using saline nasal spray several times daily and saline nasal irrigation (AYR is a common brand).   Try warm salt water gargles for sore throat.  Stop all antihistamines for now, and other non-prescription cough/cold preparations.

## 2019-04-02 NOTE — ED Provider Notes (Signed)
[facility]    CSN: [id number] Arrival date & time: 04/02/19  1227      History   Chief Complaint Chief Complaint  Patient presents with  . Sore Throat    HPI Jenna Arroyo is a 53 y.o. female.   Patient developed intermittent sore throat 5 days ago.  She has had low grade fever, fatigue, headache, myalgias, and mild cough.  She denies chest tightness, shortness of breath, pleuritic pain, and changes in taste/smell.  The history is provided by the patient.    Past Medical History:  Diagnosis Date  . Anxiety   . Hypertension   . Lyme disease   . Thyroid disease     Patient Active Problem List   Diagnosis Date Noted  . History of panic attacks 01/23/2019  . Viral syndrome 05/23/2018  . GAD (generalized anxiety disorder) 04/22/2018  . ADD (attention deficit disorder) 04/22/2018  . Axillary lymphadenopathy 12/11/2016  . Lyme disease 12/11/2016  . Dyslipidemia 06/18/2016  . Essential hypertension, benign 06/18/2016  . Acquired hypothyroidism 06/18/2016    Past Surgical History:  Procedure Laterality Date  . ABDOMINAL EXPLORATION SURGERY    . APPENDECTOMY    . MASTOIDECTOMY    . TONSILECTOMY/ADENOIDECTOMY WITH MYRINGOTOMY      OB History   No obstetric history on file.      Home Medications    Prior to Admission medications   Medication Sig Start Date End Date Taking? Authorizing Provider  albuterol (PROVENTIL HFA;VENTOLIN HFA) 108 (90 Base) MCG/ACT inhaler Inhale 1-2 puffs into the lungs every 6 (six) hours as needed. 06/14/18   Nche, Bonna Gains, NP  ALPRAZolam Prudy Feeler) 0.5 MG tablet Take 1 tablet (0.5 mg total) by mouth 5 (five) times daily. 03/29/19   Cottle, Steva Ready., MD  ARMOUR THYROID 15 MG tablet TAKE 2 TABLETS(30 MG) BY MOUTH DAILY Patient taking differently: Take 45 mg by mouth daily.  10/04/18   Copland, Gwenlyn Found, MD  atenolol (TENORMIN) 25 MG tablet Take 1 tablet (25 mg total) by mouth 2 (two) times daily. 03/27/19   Cottle, Steva Ready., MD  azelastine (ASTELIN) 0.1 % nasal spray Place 2 sprays into both nostrils 2 (two) times daily. Use in each nostril as directed 01/13/19   Saguier, [name], PA-C  Charcoal 200 MG CAPS Take by mouth every other day.    [provider]  charcoal activated, NO SORBITOL, (ACTIDOSE-AQUA) suspension Take 50 g by mouth once. 200 mg    [provider]  Cholecalciferol (VITAMIN D) 10 MCG/ML LIQD Take by mouth.    [provider]  cholestyramine (QUESTRAN) 4 GM/DOSE powder BEGIN WITH 1/4 TSP QOD. FOLLOW PROTOCOL 01/12/18   [provider]  clotrimazole (LOTRIMIN) 1 % cream Apply to affected area 2 times daily for 2 weeks 10/16/18   Robinson, [place] N, PA-C  DULoxetine (CYMBALTA) 60 MG capsule Take 1 capsule (60 mg total) by mouth daily. 01/23/19   Cottle, Steva Ready., MD  ELDERBERRY PO Take by mouth.    [provider]  fluticasone (FLONASE) 50 MCG/ACT nasal spray Place 2 sprays into both nostrils daily. 08/02/17   Saguier, [name], PA-C  fluticasone (FLOVENT HFA) 110 MCG/ACT inhaler Inhale 2 puffs into the lungs 2 (two) times daily. 11/29/18   Saguier, [name], PA-C  hydrOXYzine (ATARAX/VISTARIL) 25 MG tablet TAKE 1 TABLET(25 MG) BY MOUTH THREE TIMES DAILY AS NEEDED 01/16/19   Cottle, Steva Ready., MD  irbesartan (AVAPRO) 150 MG tablet Take  1 tablet (150 mg total) by mouth daily. Patient taking differently: Take 150 mg by mouth daily. 1 tablet in the morning and 1/2 tablet in the evening 05/24/18   Copland, Gwenlyn FoundJessica C, MD  montelukast (SINGULAIR) 10 MG tablet Take 1 tablet (10 mg total) by mouth at bedtime. 01/13/19   Saguier, Ramon DredgeEdward, PA-C  pantoprazole (PROTONIX) 40 MG tablet Take 1 tablet (40 mg total) by mouth 2 (two) times daily. Take Protonix 40 mg BID for 8 weeks,take this medication 30 mins to 1 hour prior to morning and evening meals. Then decrease to once daily until return office appointment. Patient taking differently: Take 40 mg by mouth daily.  03/29/18    Cirigliano, Vito V, DO  PREBIOTIC PRODUCT PO Take by mouth. Mix in shake and drink once a day.    [provider]  Probiotic Product (PROBIOTIC DAILY PO) Take 1 capsule by mouth daily.    [provider]  promethazine (PHENERGAN) 12.5 MG tablet Take 1 tablet (12.5 mg total) by mouth every 8 (eight) hours as needed for nausea or vomiting. 01/16/19   Saguier, Ramon DredgeEdward, PA-C    Family History Family History  Problem Relation Age of Onset  . Hypertension Mother   . Heart disease Father   . Hypertension Sister   . Hypertension Brother   . Colon cancer Neg Hx     Social History Social History   Tobacco Use  . Smoking status: Never Smoker  . Smokeless tobacco: Never Used  Substance Use Topics  . Alcohol use: No  . Drug use: No     Allergies   Ceftriaxone sodium in dextrose, Guaifenesin & derivatives, Meperidine, Other, Penicillins, Pseudoephedrine, Rosuvastatin calcium, Simvastatin, Ciprofloxacin, and Rocephin [ceftriaxone]   Review of Systems Review of Systems + sore throat + cough No pleuritic pain No wheezing + mild nasal congestion ? post-nasal drainage No sinus pain/pressure No itchy/red eyes No earache No hemoptysis No SOB + low grade fever No nausea No vomiting No abdominal pain No diarrhea but stools have been loose No urinary symptoms No skin rash + fatigue + myalgias + headache Used OTC meds without relief   Physical Exam Triage Vital Signs ED Triage Vitals  Enc Vitals Group     BP 04/02/19 1311 (!) 153/92     Pulse Rate 04/02/19 1311 80     Resp 04/02/19 1311 18     Temp 04/02/19 1311 98.1 F (36.7 C)     Temp Source 04/02/19 1311 Oral     SpO2 04/02/19 1311 98 %     Weight 04/02/19 1313 224 lb 13.9 oz (102 kg)     Height 04/02/19 1313 5\' 5"  (1.651 m)     Head Circumference --      Peak Flow --      Pain Score 04/02/19 1312 1     Pain Loc --      Pain Edu? --      Excl. in GC? --    No data found.  Updated Vital Signs  BP (!) 153/92 (BP Location: Right Arm) Comment: took hypertension med just recently  Pulse 80   Temp 98.1 F (36.7 C) (Oral)   Resp 18   Ht 5\' 5"  (1.651 m)   Wt 102 kg   SpO2 98%   BMI 37.42 kg/m   Visual Acuity Right Eye Distance:   Left Eye Distance:   Bilateral Distance:    Right Eye Near:   Left Eye Near:    Bilateral  Near:     Physical Exam Nursing notes and Vital Signs reviewed. Appearance:  Patient appears stated age, and in no acute distress Eyes:  Pupils are equal, round, and reactive to light and accomodation.  Extraocular movement is intact.  Conjunctivae are not inflamed  Ears:  Canals normal.  Tympanic membranes normal.  Nose:  Mildly congested turbinates.  No sinus tenderness.  Pharynx:  Normal Neck:  Supple.  Enlarged posterior/lateral nodes are palpated bilaterally, tender to palpation on the left.   Lungs:  Clear to auscultation.  Breath sounds are equal.  Moving air well. Heart:  Regular rate and rhythm without murmurs, rubs, or gallops.  Abdomen:  Nontender without masses or hepatosplenomegaly.  Bowel sounds are present.  No CVA or flank tenderness.  Extremities:  No edema.  Skin:  No rash present.    UC Treatments / Results  Labs (all labs ordered are listed, but only abnormal results are displayed) Labs Reviewed  SARS-COV-2 RNA, QUALITATIVE REAL-TIME RT-PCR  POCT RAPID STREP A (OFFICE) negative    EKG   Radiology No results found.  Procedures Procedures (including critical care time)  Medications Ordered in UC Medications - No data to display  Initial Impression / Assessment and Plan / UC Course  I have reviewed the triage vital signs and the nursing notes.  Pertinent labs & imaging results that were available during my care of the patient were reviewed by me and considered in my medical decision making (see chart for details).    There is no evidence of bacterial infection today.  COVID19 test pending. Followup with Family Doctor if  not improved in about 8 days.   Final Clinical Impressions(s) / UC Diagnoses   Final diagnoses:  Viral URI with cough     Discharge Instructions     Stay well hydrated.  Get adequate rest.   Recommend using saline nasal spray several times daily and saline nasal irrigation (AYR is a common brand).   Try warm salt water gargles for sore throat.  Stop all antihistamines for now, and other non-prescription cough/cold preparations.      ED Prescriptions    None        Kandra Nicolas, MD 04/04/19 1821

## 2019-04-02 NOTE — ED Triage Notes (Signed)
Patient has had intermittent intensity of sore throat over past 5 days; no fever over 100 degrees; some fatigue; very concerned about lack of COVID cautions at work and her own chronic conditions making her more vulnerable; also wants Strep evaluation.

## 2019-04-03 ENCOUNTER — Telehealth: Payer: Self-pay | Admitting: *Deleted

## 2019-04-03 NOTE — Telephone Encounter (Signed)
Patient declined visit as she was just seen yesterday. She will call us and schedule an appointment if needed!

## 2019-04-03 NOTE — Telephone Encounter (Signed)
FYI Patient went to Urgent Care yesterday   Who Is Calling Patient / Member / Family / Caregiver Call Type Triage / Clinical Relationship To Patient Self Return Phone Number [phone] (Primary) Chief Complaint Headache Reason for Call Symptomatic / Request for Jenna Arroyo states that she has had a sore throat for 4 days, with a headache, sinus issues, fever 99.5, coughing, weak, and tired. Translation No Nurse Assessment Nurse: Hardin Negus, RN, Mardene Celeste Date/Time Jenna Arroyo Time): 04/01/2019 12:56:55 PM Confirm and document reason for call. If symptomatic, describe symptoms. ---Caller states that she has had a sore throat for 4 days, with a headache, sinus issues, fever 99.5 (O), coughing, weak, and tired. She has not eaten yet. She still has her sense of smell

## 2019-04-10 ENCOUNTER — Telehealth: Payer: Self-pay

## 2019-04-10 NOTE — Telephone Encounter (Signed)
Pt notified of neg covid results. Requested copy from lab so pt can pickup.

## 2019-04-12 ENCOUNTER — Other Ambulatory Visit: Payer: Self-pay

## 2019-04-13 ENCOUNTER — Other Ambulatory Visit (HOSPITAL_COMMUNITY)
Admission: RE | Admit: 2019-04-13 | Discharge: 2019-04-13 | Disposition: A | Discharge status: Home / Self Care | Payer: Self-pay | Source: Ambulatory Visit | Attending: Family Medicine | Admitting: Family Medicine

## 2019-04-13 ENCOUNTER — Ambulatory Visit (INDEPENDENT_AMBULATORY_CARE_PROVIDER_SITE_OTHER): Payer: Self-pay | Admitting: Family Medicine

## 2019-04-13 ENCOUNTER — Encounter: Payer: Self-pay | Admitting: Family Medicine

## 2019-04-13 ENCOUNTER — Other Ambulatory Visit: Payer: Self-pay

## 2019-04-13 VITALS — BP 130/90 | HR 90 | Temp 98.2°F | Resp 16 | Ht 65.0 in | Wt 224.0 lb

## 2019-04-13 DIAGNOSIS — Z124 Encounter for screening for malignant neoplasm of cervix: Secondary | ICD-10-CM | POA: Insufficient documentation

## 2019-04-13 DIAGNOSIS — R5383 Other fatigue: Secondary | ICD-10-CM

## 2019-04-13 DIAGNOSIS — E039 Hypothyroidism, unspecified: Secondary | ICD-10-CM

## 2019-04-13 DIAGNOSIS — J029 Acute pharyngitis, unspecified: Secondary | ICD-10-CM

## 2019-04-13 DIAGNOSIS — I10 Essential (primary) hypertension: Secondary | ICD-10-CM

## 2019-04-13 LAB — CBC
HCT: 40.8 % (ref 36.0–46.0)
Hemoglobin: 13.9 g/dL (ref 12.0–15.0)
MCHC: 34.2 g/dL (ref 30.0–36.0)
MCV: 89.3 fl (ref 78.0–100.0)
Platelets: 326 10*3/uL (ref 150.0–400.0)
RBC: 4.57 Mil/uL (ref 3.87–5.11)
RDW: 13.4 % (ref 11.5–15.5)
WBC: 8.3 10*3/uL (ref 4.0–10.5)

## 2019-04-13 LAB — COMPREHENSIVE METABOLIC PANEL
ALT: 36 U/L — ABNORMAL HIGH (ref 0–35)
AST: 19 U/L (ref 0–37)
Albumin: 4.1 g/dL (ref 3.5–5.2)
Alkaline Phosphatase: 96 U/L (ref 39–117)
BUN: 12 mg/dL (ref 6–23)
CO2: 31 mEq/L (ref 19–32)
Calcium: 9.5 mg/dL (ref 8.4–10.5)
Chloride: 100 mEq/L (ref 96–112)
Creatinine, Ser: 0.86 mg/dL (ref 0.40–1.20)
GFR: 68.85 mL/min (ref >60.00)
Glucose, Bld: 80 mg/dL (ref 70–99)
Potassium: 4.6 mEq/L (ref 3.5–5.1)
Sodium: 138 mEq/L (ref 135–145)
Total Bilirubin: 0.4 mg/dL (ref 0.2–1.2)
Total Protein: 6.6 g/dL (ref 6.0–8.3)

## 2019-04-13 LAB — FERRITIN: Ferritin: 55.4 ng/mL (ref 10.0–291.0)

## 2019-04-13 LAB — TSH: TSH: 2.11 u[IU]/mL (ref 0.35–4.50)

## 2019-04-13 LAB — T3, FREE: T3, Free: 3.3 pg/mL (ref 2.3–4.2)

## 2019-04-13 LAB — VITAMIN D 25 HYDROXY (VIT D DEFICIENCY, FRACTURES): VITD: 28.3 ng/mL — ABNORMAL LOW (ref 30.00–100.00)

## 2019-04-13 NOTE — Progress Notes (Addendum)
[facility] [place] [address], [address] [place], [place] [address] [phone] [phone]  Date:  04/13/2019   Name:  Jenna GessLisa M Arroyo   DOB:  09/04/1965   MRN:  [id number]  PCP:  Pearline Cablesopland, Fuller Makin C, MD    Chief Complaint: Extremity Weakness (tested negative for covid, cough)   History of Present Illness:  Jenna Arroyo is a 53 y.o. very pleasant female patient who presents with the following:  Patient with history of hypertension, hypothyroidism, dyslipidemia. [facility].  She was tested for covid 19 at that time but I don't see this result on her chart but per pt is was negative.  Apparently her test got sent to [facility] instead of [facility], and there was a delay.  She reports that she lost her job due to being out of work during Jenna Arroyo screening  Here today with concern of feeling weak and tired since her recent urgent care visit No other specific symptoms really at this time, just still feels tired out. She actually feels a bit better today than she has recently No fever or chills No longer has a cough unless she is exposed to dust No current GI symptoms No chest pain or shortness of breath  Her ST is now resolved.  She wonders about doing a throat culture -she notes that in the past she has had a positive strep on culture when her rapid strep was negative  Pap: about 10 years ago, she is willing to screen today Colon cancer screening: Flu shot-declines  Would like to do routine labs today as well   Patient Active Problem List   Diagnosis Date Noted  . History of panic attacks 01/23/2019  . GAD (generalized anxiety disorder) 04/22/2018  . ADD (attention deficit disorder) 04/22/2018  . Axillary lymphadenopathy 12/11/2016  . Lyme disease 12/11/2016  . Dyslipidemia 06/18/2016  . Essential hypertension, benign 06/18/2016  . Acquired hypothyroidism 06/18/2016    Past Medical History:  Diagnosis Date  . Anxiety    . Hypertension   . Lyme disease   . Thyroid disease     Past Surgical History:  Procedure Laterality Date  . ABDOMINAL EXPLORATION SURGERY    . APPENDECTOMY    . MASTOIDECTOMY    . TONSILECTOMY/ADENOIDECTOMY WITH MYRINGOTOMY      Social History   Tobacco Use  . Smoking status: Never Smoker  . Smokeless tobacco: Never Used  Substance Use Topics  . Alcohol use: No  . Drug use: No    Family History  Problem Relation Age of Onset  . Hypertension Mother   . Heart disease Father   . Hypertension Sister   . Hypertension Brother   . Colon cancer Neg Hx     Allergies  Allergen Reactions  . Ceftriaxone Sodium In Dextrose Other (See Comments)    "heart races and cannot breathe"  . Guaifenesin & Derivatives Other (See Comments)    "heart races, dizziness, light-headedness"  . Meperidine Other (See Comments)    "heart races"  . Other Rash  . Penicillins Hives  . Pseudoephedrine Other (See Comments)    "heart races and high blood pressure"  . Rosuvastatin Calcium Nausea And Vomiting    Muscle aches   . Simvastatin Other (See Comments)    muscle aching   . Ciprofloxacin   . Rocephin [Ceftriaxone] Itching    Medication list has been reviewed and updated.  Current  Outpatient Medications on File Prior to Visit  Medication Sig Dispense Refill  . albuterol (PROVENTIL HFA;VENTOLIN HFA) 108 (90 Base) MCG/ACT inhaler Inhale 1-2 puffs into the lungs every 6 (six) hours as needed. 1 Inhaler 0  . ALPRAZolam (XANAX) 0.5 MG tablet Take 1 tablet (0.5 mg total) by mouth 5 (five) times daily. 150 tablet 5  . ARMOUR THYROID 15 MG tablet TAKE 2 TABLETS(30 MG) BY MOUTH DAILY (Patient taking differently: Take 45 mg by mouth daily. ) 180 tablet 1  . atenolol (TENORMIN) 25 MG tablet Take 1 tablet (25 mg total) by mouth 2 (two) times daily. 180 tablet 2  . azelastine (ASTELIN) 0.1 % nasal spray Place 2 sprays into both nostrils 2 (two) times daily. Use in each nostril as directed 30 mL 1  .  Charcoal 200 MG CAPS Take by mouth every other day.    . charcoal activated, NO SORBITOL, (ACTIDOSE-AQUA) suspension Take 50 g by mouth once. 200 mg    . Cholecalciferol (VITAMIN D) 10 MCG/ML LIQD Take by mouth.    . cholestyramine (QUESTRAN) 4 GM/DOSE powder BEGIN WITH 1/4 TSP QOD. FOLLOW PROTOCOL  1  . clotrimazole (LOTRIMIN) 1 % cream Apply to affected area 2 times daily for 2 weeks 15 g 0  . DULoxetine (CYMBALTA) 60 MG capsule Take 1 capsule (60 mg total) by mouth daily. 90 capsule 1  . ELDERBERRY PO Take by mouth.    . fluticasone (FLONASE) 50 MCG/ACT nasal spray Place 2 sprays into both nostrils daily. 16 g 1  . fluticasone (FLOVENT HFA) 110 MCG/ACT inhaler Inhale 2 puffs into the lungs 2 (two) times daily. 1 Inhaler 12  . hydrOXYzine (ATARAX/VISTARIL) 25 MG tablet TAKE 1 TABLET(25 MG) BY MOUTH THREE TIMES DAILY AS NEEDED 30 tablet 3  . irbesartan (AVAPRO) 150 MG tablet Take 1 tablet (150 mg total) by mouth daily. (Patient taking differently: Take 150 mg by mouth daily. 1 tablet in the morning and 1/2 tablet in the evening) 90 tablet 3  . montelukast (SINGULAIR) 10 MG tablet Take 1 tablet (10 mg total) by mouth at bedtime. 30 tablet 0  . pantoprazole (PROTONIX) 40 MG tablet Take 1 tablet (40 mg total) by mouth 2 (two) times daily. Take Protonix 40 mg BID for 8 weeks,take this medication 30 mins to 1 hour prior to morning and evening meals. Then decrease to once daily until return office appointment. (Patient taking differently: Take 40 mg by mouth daily. ) 150 tablet 0  . PREBIOTIC PRODUCT PO Take by mouth. Mix in shake and drink once a day.    . Probiotic Product (PROBIOTIC DAILY PO) Take 1 capsule by mouth daily.    . promethazine (PHENERGAN) 12.5 MG tablet Take 1 tablet (12.5 mg total) by mouth every 8 (eight) hours as needed for nausea or vomiting. 20 tablet 0   No current facility-administered medications on file prior to visit.     Review of Systems:  As per HPI- otherwise  negative.   Physical Examination: Vitals:   04/13/19 1317  BP: 130/90  Pulse: (!) 101  Resp: 16  Temp: 98.2 F (36.8 C)  SpO2: 97%   Vitals:   04/13/19 1317  Weight: 224 lb (101.6 kg)  Height: 5\' 5"  (1.651 m)   Body mass index is 37.28 kg/m. Ideal Body Weight: Weight in (lb) to have BMI = 25: 149.9  GEN: WDWN, NAD, Non-toxic, A & O x 3, obese, looks well HEENT: Atraumatic, Normocephalic. Neck supple. No masses,  No LAD.  Bilateral TM wnl, oropharynx normal.  PEERL,EOMI.   Ears and Nose: No external deformity. CV: RRR, No M/G/R. No JVD. No thrill. No extra heart sounds. PULM: CTA B, no wheezes, crackles, rhonchi. No retractions. No resp. distress. No accessory muscle use. ABD: S, NT, ND, +BS. No rebound. No HSM. EXTR: No c/c/e NEURO Normal gait.  PSYCH: Normally interactive. Conversant. Not depressed or anxious appearing.  Calm demeanor.  Pelvic: normal, no vaginal lesions or discharge. Uterus normal, no CMT, no adnexal tendereness or masses She has some slight skin irritation in the right inguinal fold from where she wore a new pair of underwear.  Discussed how to treat this with a barrier cream and powder    Assessment and Plan: Fatigue, unspecified type - Plan: Vitamin D (25 hydroxy), CBC, Comprehensive metabolic panel, TSH, Ferritin, T3, free  Acquired hypothyroidism - Plan: TSH, T3, free  Essential hypertension, benign - Plan: CBC, Comprehensive metabolic panel  Screening for cervical cancer - Plan: Cytology - PAP  H/O throat culture  Pharyngitis, unspecified etiology - Plan: Culture, Group A Strep  Here today with concern of nonspecific fatigue for the last couple of weeks.  There are several labs she would like me to check, which is fine We will obtain a throat culture today to rule out occult strep infection Pap overdue, collected today Patient declines flu shot  Remind patient about colon cancer screening with labs  Signed Lamar Blinks,  MD  Received her labs 10/12- letter to pt  Pap still not back, will send separately   Results for orders placed or performed in visit on 04/13/19  Culture, Group A Strep   Specimen: Throat  Result Value Ref Range   MICRO NUMBER: [id number]    SPECIMEN QUALITY: Adequate    SOURCE: THROAT    STATUS: FINAL    RESULT: No group A Streptococcus isolated   Vitamin D (25 hydroxy)  Result Value Ref Range   VITD 28.30 (L) 30.00 - 100.00 ng/mL  CBC  Result Value Ref Range   WBC 8.3 4.0 - 10.5 K/uL   RBC 4.57 3.87 - 5.11 Mil/uL   Platelets 326.0 150.0 - 400.0 K/uL   Hemoglobin 13.9 12.0 - 15.0 g/dL   HCT 40.8 36.0 - 46.0 %   MCV 89.3 78.0 - 100.0 fl   MCHC 34.2 30.0 - 36.0 g/dL   RDW 13.4 11.5 - 15.5 %  Comprehensive metabolic panel  Result Value Ref Range   Sodium 138 135 - 145 mEq/L   Potassium 4.6 3.5 - 5.1 mEq/L   Chloride 100 96 - 112 mEq/L   CO2 31 19 - 32 mEq/L   Glucose, Bld 80 70 - 99 mg/dL   BUN 12 6 - 23 mg/dL   Creatinine, Ser 0.86 0.40 - 1.20 mg/dL   Total Bilirubin 0.4 0.2 - 1.2 mg/dL   Alkaline Phosphatase 96 39 - 117 U/L   AST 19 0 - 37 U/L   ALT 36 (H) 0 - 35 U/L   Total Protein 6.6 6.0 - 8.3 g/dL   Albumin 4.1 3.5 - 5.2 g/dL   Calcium 9.5 8.4 - 10.5 mg/dL   GFR 68.85 >60.00 mL/min  TSH  Result Value Ref Range   TSH 2.11 0.35 - 4.50 uIU/mL  Ferritin  Result Value Ref Range   Ferritin 55.4 10.0 - 291.0 ng/mL  T3, free  Result Value Ref Range   T3, Free 3.3 2.3 - 4.2 pg/mL

## 2019-04-13 NOTE — Patient Instructions (Addendum)
It was good to see you today- take care and I will be in touch with your labs and pap asap  We will check for any sign of a strep throat infection   We will try and find any explanation for your fatigue

## 2019-04-16 LAB — CULTURE, GROUP A STREP
MICRO NUMBER:: 969238
SPECIMEN QUALITY:: ADEQUATE

## 2019-04-17 ENCOUNTER — Telehealth: Payer: Self-pay

## 2019-04-17 ENCOUNTER — Encounter: Payer: Self-pay | Admitting: Family Medicine

## 2019-04-17 NOTE — Telephone Encounter (Signed)
Copied from [place] [id number]. Topic: General - Inquiry >> Apr 17, 2019  1:50 PM Rainey Pines A wrote: Patient called to inform Dr. Lorelei Pont that Dr. Marya Landry diagnosed her with Epstein-Barr virus condition years ago and found evidence in lab work.  Patient thinks that this is why she feels she has been having mono for so long. Patient would like a callback from Dr. Lorelei Pont nurse.

## 2019-04-18 NOTE — Telephone Encounter (Signed)
Left message to return call. Needing more information on what the patient means and would like to know. Epstein barr virus is mono so im not exactly sure what she is meaning. Cooper for pec to discuss with patient to gain more info.

## 2019-04-18 NOTE — Telephone Encounter (Signed)
Hi Jenna Arroyo- can you please give her a call back.  Recent platelet count was normal I have send her labs in the mail- would she like to set up mychart?  However lab notes are as follows:  Your throat culture was negative for strep Vitamin D is minimally low.  I would recommend 2,000 iu of vitamin D daily if you are not already taking a supplement Blood counts normal Metabolic profile normal- ALT of 36 is ok Thyroid normal Iron normal  Your labs are all reassuring, no definite explanation for your fatigue.  Please let me know if you are not feeling better soon.   I don't have an explanation for her fatigue from her recnet lab work exceot for slightly low vit D.  Platelets normal.  I am glad to refer her for a second opinion if she would like?  I am not sure how to help her next  Thank you! [place]

## 2019-04-18 NOTE — Telephone Encounter (Signed)
Patient states her immune system is low and epstein barr virus can make your platelets low and which can cause a relapse of mono. Patient states her body is weak , head ache no longer has a sore throat . No energy doing the basic things, please advise. Best [phone]

## 2019-04-19 ENCOUNTER — Ambulatory Visit (INDEPENDENT_AMBULATORY_CARE_PROVIDER_SITE_OTHER): Payer: Self-pay | Admitting: Psychiatry

## 2019-04-19 ENCOUNTER — Encounter: Payer: Self-pay | Admitting: Psychiatry

## 2019-04-19 ENCOUNTER — Other Ambulatory Visit: Payer: Self-pay

## 2019-04-19 DIAGNOSIS — I10 Essential (primary) hypertension: Secondary | ICD-10-CM

## 2019-04-19 DIAGNOSIS — F411 Generalized anxiety disorder: Secondary | ICD-10-CM

## 2019-04-19 DIAGNOSIS — Z8659 Personal history of other mental and behavioral disorders: Secondary | ICD-10-CM

## 2019-04-19 DIAGNOSIS — F900 Attention-deficit hyperactivity disorder, predominantly inattentive type: Secondary | ICD-10-CM

## 2019-04-19 MED ORDER — LORAZEPAM 0.5 MG PO TABS: ORAL_TABLET | ORAL | 0 refills | Status: DC

## 2019-04-19 NOTE — Telephone Encounter (Signed)
Patient notified and verbalized understanding. She thinks she had mono again which was causing her fatigue. She states she has been bed ridden for going on 10 days and is now starting to feel better. Advised her to take otc 2000 iu vit d-pt agreed.   Kirsten I verified her email with her and cell number. She is downloading the [employer]. Her chart says shes active however she has never used. Is there something we can send her to set up her mychart account? Is there a code we can send her?

## 2019-04-19 NOTE — Progress Notes (Addendum)
Jenna Arroyo [id number] 02-03-1966 53 y.o.  Subjective:   Patient ID:  Jenna Arroyo is a 53 y.o. (DOB 06-27-1966) female.  Chief Complaint:  Chief Complaint  Patient presents with  . Follow-up    Medication Management  . Anxiety    Medication Management    Anxiety Symptoms include nervous/anxious behavior. Patient reports no confusion, decreased concentration or suicidal ideas.     Jenna Arroyo presents to the office today for follow-up of anxiety and mood.  Last seen January 23, 2019.  No meds were changed.  She called September 11 stating she had started her new job and was having trouble retaining information that she was learning.  We discussed the possibility that this could be a potential side effect of Xanax which she has been on chronically.  I suggested trying an alternative of lorazepam 1 mg 3 times daily to see if cognition would improve.  We did not get a response to that particular situation.  She called back September 22 asking for refill of Xanax instead.  PDMP does not show that she ever picked up any lorazepam.  Overall OK but real stressful.  Did find a new job.  It was very stressful and got sick with negative Covid.  Integrative doctor said she had a flare up of EBV.  Sucked the life out of her.  Ended up losing her job.  Called an attorney she knows but plans to pursue disability over EBV, chronic Lyme disease, and other things.  Still recovering.  Yesterday first day she's been up and around.  Emotionally relatively OK usually.  Losing the job contributed to feeling down.    Lost job but starting another soon.  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  Seems like a nice place.  Does worry about health otherwise doing ok.  Living in apt.  Grief over ;the world sitiuation.  Health concerns go up and down with relapses with Lyme dz.  Migraine and sore joints and poor STM affected.  Also has Hashimotos..   Had to move out from parent's (pushced by siblings)  and has had Lyme 16 years  and chronic health problems.  They don't understand how sick I am.  They picked out apt behind her back.  This increased her financial stress.  Limited $ for health issues.    Patient reports stable mood and denies depressed or irritable moods.  Patient denies any recent difficulty with anxiety.  sleep affected by smokers around her apt.  Will end in 9 days, moving. Denies appetite disturbance.  Patient reports that energy and motivation have been good.  Patient denies any difficulty with concentration.  Patient denies any suicidal ideation. No panic.  Anxiety manageable.  Using Xanax mostly TID.  On duloxetine.  Sleep better in the day.  Went to neuro and planned sleep study in 8 days.  No questions concerns re: meds.  Past Psychiatric Medication Trials: Duloxetine 60, imipramine, desipramine, sertraline hyper, citalopram side effects, paroxetine chest pain, Nuvigil edgy, buspirone dizzy, Xanax, Ambien anxiety, trazodone side effects, atenolol  Review of Systems:  Review of Systems  Constitutional: Positive for fatigue.       Low exercise tolerance  Gastrointestinal: Negative for vomiting.  Musculoskeletal: Positive for arthralgias and neck pain.  Neurological: Positive for headaches. Negative for tremors and weakness.       Foggy headed.  Psychiatric/Behavioral: Positive for sleep disturbance. Negative for agitation, behavioral problems, confusion, decreased concentration, dysphoric mood, hallucinations, self-injury and suicidal ideas. The patient  is nervous/anxious. The patient is not hyperactive.     Medications: I have reviewed the patient's current medications.  Current Outpatient Medications  Medication Sig Dispense Refill  . albuterol (PROVENTIL HFA;VENTOLIN HFA) 108 (90 Base) MCG/ACT inhaler Inhale 1-2 puffs into the lungs every 6 (six) hours as needed. 1 Inhaler 0  . ALPRAZolam (XANAX) 0.5 MG tablet Take 1 tablet (0.5 mg total) by mouth 5 (five) times daily. 150 tablet 5  .  ARMOUR THYROID 15 MG tablet TAKE 2 TABLETS(30 MG) BY MOUTH DAILY (Patient taking differently: Take 45 mg by mouth daily. ) 180 tablet 1  . atenolol (TENORMIN) 25 MG tablet Take 1 tablet (25 mg total) by mouth 2 (two) times daily. 180 tablet 2  . azelastine (ASTELIN) 0.1 % nasal spray Place 2 sprays into both nostrils 2 (two) times daily. Use in each nostril as directed 30 mL 1  . Charcoal 200 MG CAPS Take by mouth every other day.    . charcoal activated, NO SORBITOL, (ACTIDOSE-AQUA) suspension Take 50 g by mouth once. 200 mg    . Cholecalciferol (VITAMIN D) 10 MCG/ML LIQD Take by mouth.    . cholestyramine (QUESTRAN) 4 GM/DOSE powder BEGIN WITH 1/4 TSP QOD. FOLLOW PROTOCOL  1  . clotrimazole (LOTRIMIN) 1 % cream Apply to affected area 2 times daily for 2 weeks 15 g 0  . DULoxetine (CYMBALTA) 60 MG capsule Take 1 capsule (60 mg total) by mouth daily. 90 capsule 1  . ELDERBERRY PO Take by mouth.    . fluticasone (FLOVENT HFA) 110 MCG/ACT inhaler Inhale 2 puffs into the lungs 2 (two) times daily. 1 Inhaler 12  . irbesartan (AVAPRO) 150 MG tablet Take 1 tablet (150 mg total) by mouth daily. (Patient taking differently: Take 150 mg by mouth daily. 1 tablet in the morning and 1/2 tablet in the evening) 90 tablet 3  . PREBIOTIC PRODUCT PO Take by mouth. Mix in shake and drink once a day.    Marland Kitchen LORazepam (ATIVAN) 0.5 MG tablet 1/2 tablet four times daily for 1 week then 1 four times daily 120 tablet 0  . montelukast (SINGULAIR) 10 MG tablet Take 1 tablet (10 mg total) by mouth at bedtime. (Patient not taking: Reported on 04/19/2019) 30 tablet 0  . pantoprazole (PROTONIX) 40 MG tablet Take 1 tablet (40 mg total) by mouth 2 (two) times daily. Take Protonix 40 mg BID for 8 weeks,take this medication 30 mins to 1 hour prior to morning and evening meals. Then decrease to once daily until return office appointment. (Patient not taking: Reported on 04/19/2019) 150 tablet 0   No current facility-administered  medications for this visit.     Medication Side Effects: None  Allergies:  Allergies  Allergen Reactions  . Ceftriaxone Sodium In Dextrose Other (See Comments)    "heart races and cannot breathe"  . Guaifenesin & Derivatives Other (See Comments)    "heart races, dizziness, light-headedness"  . Meperidine Other (See Comments)    "heart races"  . Other Rash  . Penicillins Hives  . Pseudoephedrine Other (See Comments)    "heart races and high blood pressure"  . Rosuvastatin Calcium Nausea And Vomiting    Muscle aches   . Simvastatin Other (See Comments)    muscle aching   . Ciprofloxacin   . Rocephin [Ceftriaxone] Itching    Past Medical History:  Diagnosis Date  . Anxiety   . Hypertension   . Lyme disease   . Thyroid disease  Family History  Problem Relation Age of Onset  . Hypertension Mother   . Heart disease Father   . Hypertension Sister   . Hypertension Brother   . Colon cancer Neg Hx     Social History   Socioeconomic History  . Marital status: Divorced    Spouse name: Not on file  . Number of children: Not on file  . Years of education: Not on file  . Highest education level: Not on file  Occupational History  . Not on file  Social Needs  . Financial resource strain: Not on file  . Food insecurity    Worry: Not on file    Inability: Not on file  . Transportation needs    Medical: Not on file    Non-medical: Not on file  Tobacco Use  . Smoking status: Never Smoker  . Smokeless tobacco: Never Used  Substance and Sexual Activity  . Alcohol use: No  . Drug use: No  . Sexual activity: Not Currently  Lifestyle  . Physical activity    Days per week: Not on file    Minutes per session: Not on file  . Stress: Not on file  Relationships  . Social Musician on phone: Not on file    Gets together: Not on file    Attends religious service: Not on file    Active member of club or organization: Not on file    Attends meetings of clubs  or organizations: Not on file    Relationship status: Not on file  . Intimate partner violence    Fear of current or ex partner: Not on file    Emotionally abused: Not on file    Physically abused: Not on file    Forced sexual activity: Not on file  Other Topics Concern  . Not on file  Social History Narrative  . Not on file    Past Medical History, Surgical history, Social history, and Family history were reviewed and updated as appropriate.   Please see review of systems for further details on the patient's review from today.   Objective:   Physical Exam:  There were no vitals taken for this visit.  Physical Exam Neurological:     Mental Status: She is alert and oriented to person, place, and time.     Cranial Nerves: No dysarthria.  Psychiatric:        Attention and Perception: She is inattentive.        Mood and Affect: Mood is anxious.        Speech: Speech normal.        Behavior: Behavior is cooperative.        Thought Content: Thought content normal. Thought content is not paranoid or delusional. Thought content does not include homicidal or suicidal ideation. Thought content does not include homicidal or suicidal plan.        Cognition and Memory: Cognition normal.        Judgment: Judgment normal.     Comments: Insight fair.  Patient has chronically low stress tolerance. Complain of chronic forgetfulness     Lab Review:     Component Value Date/Time   NA 138 04/13/2019 1411   K 4.6 04/13/2019 1411   CL 100 04/13/2019 1411   CO2 31 04/13/2019 1411   GLUCOSE 80 04/13/2019 1411   BUN 12 04/13/2019 1411   CREATININE 0.86 04/13/2019 1411   CREATININE 0.90 12/11/2016 1613   CALCIUM 9.5 04/13/2019 1411  PROT 6.6 04/13/2019 1411   ALBUMIN 4.1 04/13/2019 1411   AST 19 04/13/2019 1411   ALT 36 (H) 04/13/2019 1411   ALKPHOS 96 04/13/2019 1411   BILITOT 0.4 04/13/2019 1411   GFRNONAA >60 07/14/2018 1647   GFRAA >60 07/14/2018 1647       Component Value  Date/Time   WBC 8.3 04/13/2019 1411   RBC 4.57 04/13/2019 1411   HGB 13.9 04/13/2019 1411   HCT 40.8 04/13/2019 1411   PLT 326.0 04/13/2019 1411   MCV 89.3 04/13/2019 1411   MCH 30.4 07/14/2018 1647   MCHC 34.2 04/13/2019 1411   RDW 13.4 04/13/2019 1411   LYMPHSABS 1.9 03/02/2017 1301   MONOABS 0.4 03/02/2017 1301   EOSABS 0.3 03/02/2017 1301   BASOSABS 0.1 03/02/2017 1301    No results found for: POCLITH, LITHIUM   No results found for: PHENYTOIN, PHENOBARB, VALPROATE, CBMZ   .res Assessment: Plan:   Generalized anxiety disorder, manageable  Attention deficit hyperactivity disorder improved with duloxetine  History of panic attacks in remission with current meds  Still recommend trial switch to Ativan 1 mg QID to see if cognition is better.  Her cognitive problems have frequently been in a contributor to her difficulty maintaining employment.  She is fearful about having Xanax withdrawal.  This was discussed in detail.  Also discussed in detail the potential sedative effects of benzodiazepines and especially in the first week of the transition from 1 of the medications to another.  It is likely that she will be more successful with the transition if she is a little more sleepy than normal the first week by using half of each medication then if we abruptly switch medications.  That has a much lower success rate in her case because of her worry about the transition. She agrees to a trial.  Will do 1/2 of 0.5 mg tablet tablet of Xanax and 1/2 of 1 mg tablet Ativan QID for 1 week. Then DC alprazolam and take Ativan 1 mg 3 times daily or 4 times daily to control anxiety.   Call back in 2 to 3 weeks to give a report of how well she is doing with the new medication.  DC hydroxyzine because of its potential to cause cognitive problems. Continue duloxetine 60 mg daily.  Disc somatic concerns.  We discussed the short-term risks associated with benzodiazepines including sedation and  increased fall risk among others.  Discussed long-term side effect risk including dependence, potential withdrawal symptoms, and the potential eventual dose-related risk of dementia as well as short-term cognitive problems..  FU 6 mos  Meredith Staggersarey Cottle, MD, DFAPA   Please see After Visit Summary for patient specific instructions.  No future appointments.  No orders of the defined types were placed in this encounter.     -------------------------------

## 2019-04-20 ENCOUNTER — Telehealth: Payer: Self-pay | Admitting: Psychiatry

## 2019-04-20 DIAGNOSIS — Z8659 Personal history of other mental and behavioral disorders: Secondary | ICD-10-CM

## 2019-04-20 DIAGNOSIS — F411 Generalized anxiety disorder: Secondary | ICD-10-CM

## 2019-04-20 MED ORDER — ALPRAZOLAM 0.5 MG PO TABS: 0.2500 mg | ORAL_TABLET | Freq: Four times a day (QID) | ORAL | 0 refills | Status: DC

## 2019-04-20 NOTE — Telephone Encounter (Signed)
Pt reports that she requested that pharmacy fill Lorazepam to start trial of Lorazepam and that she was also out of Xanax and requested partial fill to take 1/2 tab of Xanax with 1/2 tab of Lorazepam as directed by Dr. Clovis Pu. She reports that her pharmacy told her that they are unable to fill Alprazolam since they were directed to d/c any remaining refills of alprazolam. Pt reports that her last alprazolam fill was a partial fill of #45 which is consistent with controlled substance database. Informed pt that refills were likely d/c'd based on qty of #150 prescribed and since she was being switched to lorazepam. Script sent for alprazolam 0.5 mg 1/2 tab po four times daily for one week #15.

## 2019-04-21 ENCOUNTER — Telehealth: Payer: Self-pay | Admitting: Psychiatry

## 2019-04-21 NOTE — Telephone Encounter (Signed)
Patient stated she thought she was told to take a whole Lorazepam tablet, 4x a day with 1/2 of Alprazolam, patient seems to be confused about the instructions please advise.

## 2019-04-21 NOTE — Telephone Encounter (Signed)
Pt made after hours call to clarify instructions re: Ativan and Xanax. Reiterated instructions to take 1/2 tab of both medications four times daily for one week before transitioning to 1 Ativan tab four times daily. Pt reports that when she has split lorazepam tabs that usually at least 1/2 of tab has crumbled despite using new pill cutter. Advised pt to try to split tabs as evenly as possible and that if one half is slightly larger it would likely not make a significant difference. Discussed that crushed pieces could also be taken with food. Pt verbalized understanding.

## 2019-04-24 ENCOUNTER — Telehealth: Payer: Self-pay | Admitting: Psychiatry

## 2019-04-24 ENCOUNTER — Encounter: Payer: Self-pay | Admitting: Family Medicine

## 2019-04-24 LAB — CYTOLOGY - PAP
Adequacy: ABSENT
Comment: NEGATIVE
Diagnosis: NEGATIVE
High risk HPV: NEGATIVE

## 2019-04-24 NOTE — Telephone Encounter (Signed)
This is an old message, see previous messages

## 2019-04-24 NOTE — Telephone Encounter (Signed)
Pt left message Friday @ 6:18 on v-mail. Needs clarification on meds. Not sure what to do about taking Lorazepam and Alprazolam together. Please advise [phone]

## 2019-04-25 ENCOUNTER — Telehealth: Payer: Self-pay | Admitting: Psychiatry

## 2019-04-25 NOTE — Telephone Encounter (Signed)
Lorazepam is typically less sedating than alprazolam which may be part of the reason she is not sleeping as well.  Please ask how she is currently dosing the lorazepam?  I would suggest changing the dose to 1 tablet in the morning 1 tablet in the afternoon and 2 tablets at night and that should resolve the insomnia.  If in her insomnia resolves that should improve her daytime alertness.  If this does not work let us know.  It is only been a few days she may need a little bit more time to adjust.

## 2019-04-25 NOTE — Telephone Encounter (Signed)
Patient stated medication was switched Alprazolam to Lorazepam patient not able to sleep since the change of medications she doesn't feel as though she is alert

## 2019-04-26 NOTE — Telephone Encounter (Signed)
Spoke with pt. She was splitting them in half instead of taking a whole pill. She will try this and call us back if no improvement.

## 2019-04-26 NOTE — Telephone Encounter (Signed)
Patient called and left a message  said that he she is still waiting to hear from the nurse about her insomnia. Please call her  At [phone]

## 2019-04-28 ENCOUNTER — Telehealth: Payer: Self-pay | Admitting: Psychiatry

## 2019-04-28 ENCOUNTER — Other Ambulatory Visit: Payer: Self-pay

## 2019-04-28 DIAGNOSIS — Z8659 Personal history of other mental and behavioral disorders: Secondary | ICD-10-CM

## 2019-04-28 DIAGNOSIS — F411 Generalized anxiety disorder: Secondary | ICD-10-CM

## 2019-04-28 MED ORDER — ALPRAZOLAM 0.5 MG PO TABS: ORAL_TABLET | ORAL | 0 refills | Status: DC

## 2019-04-28 NOTE — Telephone Encounter (Signed)
Pt left v-mail @ 3:43am. Stating she has not slept in 3 nights. Seems the transaction from one med to another has gotten worse. Please advise @ [phone].

## 2019-04-28 NOTE — Telephone Encounter (Signed)
Patient notified of instructions and will send an updated Rx to her pharmacy since patient has none.

## 2019-04-28 NOTE — Telephone Encounter (Signed)
Stop lorazepam and switch back to regular dosage of alprazolam.

## 2019-05-08 ENCOUNTER — Telehealth: Payer: Self-pay | Admitting: Family Medicine

## 2019-05-08 NOTE — Telephone Encounter (Signed)
Pt stated that it is ok for her father's request about her ARMOUR THYROID 15 MG tablet medication, and she would like to know if the practice has a form where she can get the medication cheaper from the manufacturer.  Please advise.

## 2019-05-08 NOTE — Telephone Encounter (Signed)
Pt's father is requesting to change pt's  ARMOUR THYROID 15 MG tablet to 60 MG as this would be cheaper. They would cut the pills into fours and this would save money since pt is currently unemployed and uninsured. Please advise and reach out with questions.  CB#[phone]  [id number] - [place], [place] - [address] AT [address] [phone] (Phone) [phone] (Fax)

## 2019-05-09 NOTE — Telephone Encounter (Signed)
I am fine with making a sub- however I thought she was taking more than 15 mg of armour daily.  I had thought she was taking 45 mg- can you please call her and clarify?  Thank you!

## 2019-05-09 NOTE — Telephone Encounter (Signed)
Ok to change medication?

## 2019-05-10 ENCOUNTER — Other Ambulatory Visit: Payer: Self-pay | Admitting: Family Medicine

## 2019-05-10 ENCOUNTER — Other Ambulatory Visit: Payer: Self-pay

## 2019-05-10 ENCOUNTER — Telehealth: Payer: Self-pay | Admitting: Family Medicine

## 2019-05-10 DIAGNOSIS — I10 Essential (primary) hypertension: Secondary | ICD-10-CM

## 2019-05-10 MED ORDER — ARMOUR THYROID 15 MG PO TABS: ORAL_TABLET | ORAL | 5 refills | Status: DC

## 2019-05-10 NOTE — Telephone Encounter (Signed)
Patient called back to clarify her father was the one calling to change her medication dose. He was trying to help her because she lost her job and he thought if she gets more medicine it will save her some money but she needs to continue 45 mg as prescribed.  rx was sent again for her old regular dose Armour t. 15 mg tabs to take 45 mg daily.

## 2019-05-10 NOTE — Telephone Encounter (Signed)
rx refill  irbesartan (AVAPRO) 150 MG tablet  [place] [phone] (Phone) [phone] (Fax)

## 2019-05-10 NOTE — Telephone Encounter (Signed)
Medication refilled and sent to pharmacy.

## 2019-05-10 NOTE — Telephone Encounter (Signed)
Left message to return call 

## 2019-05-15 ENCOUNTER — Telehealth: Payer: Self-pay | Admitting: *Deleted

## 2019-05-15 NOTE — Telephone Encounter (Signed)
Looks like nurse called in [place] for her.  Not sure if you guys would like to do virtual or in person visit?

## 2019-05-15 NOTE — Telephone Encounter (Signed)
virtual

## 2019-05-15 NOTE — Telephone Encounter (Signed)
Patient stated that she does not feel as bad now.  Blood pressure is up a little.  She stated that her son, his wife, and kids came over and they were sick and are going to be tested for covid.  Tried to get her set up for virtual visit and she stated that she does not have a job right now and can not afford one at this time.

## 2019-05-15 NOTE — Telephone Encounter (Signed)
Who Is Calling Patient / Member / Family / Caregiver Call Type Triage / Clinical Relationship To Patient Self Return Phone Number [phone] (Primary) Chief Complaint Headache Reason for Call Symptomatic / Request for [place] states they have had a migraine for four days. They have also been nauseous. Caller thought it was from their airducts but they have not been able to sleep. Caller has been in a lot of pain with the migraine. Caller was looking to see if there is any way to get something. Translation No Nurse Assessment Nurse: Micki Riley, RN, Domenick Gong Date/Time (Eastern Time): 05/13/2019 4:42:22 PM Confirm and document reason for call. If symptomatic, describe symptoms. ---Caller states she has had severe migraine pain for four days; also been nauseous. Treating with tylenol, not much relief. Pain now is 5/10. Denies fevers.  Nurse Assessment Other current medications? ---Unknown Medication allergies? ---Yes List medication allergies. ---guafenesin, sinus medications, pcn, sulfa Pharmacy name and phone number. ---[facility] [phone] Additional Documentation ---Left vm at pharmacy with patient information, provider office information, and smo rx information. Guidelines Guideline Title Affirmed Question Affirmed Notes Nurse Date/Time (Eastern Time) Headache [1] MODERATE headache (e.g., interferes with normal activities) AND [2] present > 24 hours AND [3] unexplained (Exceptions: analgesics not tried, typical migraine, or headache part of viral illness) Cazares, RN, Domenick Gong 05/13/2019 4:49:31 PM Disp. Time Eilene Ghazi Time) Disposition Final User 05/13/2019 5:00:32 PM Pharmacy Call Micki Riley, RN, Domenick Gong Reason: See documentation. 05/13/2019 5:00:11 PM See PCP within 24 Hours Yes Cazares, RN, [employer]  Standing Orders Preparation Additional Instructions Route Frequency Duration Nurse Comments User  Name Phenergan 25 mg 1 tablet # 6 Oral Every 4 Hours Cazares, Therapist, sports, Sanjuanita Janie

## 2019-05-16 ENCOUNTER — Telehealth: Payer: Self-pay

## 2019-05-16 ENCOUNTER — Other Ambulatory Visit: Payer: Self-pay

## 2019-05-16 DIAGNOSIS — Z20828 Contact with and (suspected) exposure to other viral communicable diseases: Secondary | ICD-10-CM

## 2019-05-16 DIAGNOSIS — Z20822 Contact with and (suspected) exposure to covid-19: Secondary | ICD-10-CM

## 2019-05-16 NOTE — Addendum Note (Signed)
Addended by: Lamar Blinks C on: 05/16/2019 11:27 AM   Modules accepted: Orders

## 2019-05-16 NOTE — Telephone Encounter (Signed)
Please give her a call back.  I have ordered the covid nasal swab for her, she can drive up to [facility] testing site between 10a and 3p M-F to be tested.    She should self isolate until her results return  Thank you!

## 2019-05-16 NOTE — Telephone Encounter (Signed)
Copied from [place] [phone]. Topic: General - Inquiry >> May 15, 2019 11:14 AM Reyne Dumas L wrote: Reason for CRM:   Pt states that her daughter in law came to her home yesterday and has reported that she had a direct contact with someone who has tested positive for COVID.  Pt states that daughter in law and grandchildren were sick yesterday when they were in contact with the pt.  Pt wants to know what she needs to do. Pt can be reached at [phone]

## 2019-05-16 NOTE — Telephone Encounter (Signed)
Tried calling patient, no answer left detailed message with instructions. Advised to call back if any further questions.

## 2019-05-18 LAB — NOVEL CORONAVIRUS, NAA: SARS-CoV-2, NAA: NOT DETECTED

## 2019-06-27 ENCOUNTER — Other Ambulatory Visit: Payer: Self-pay | Admitting: Adult Health

## 2019-06-27 DIAGNOSIS — F411 Generalized anxiety disorder: Secondary | ICD-10-CM

## 2019-06-27 DIAGNOSIS — Z8659 Personal history of other mental and behavioral disorders: Secondary | ICD-10-CM

## 2019-06-27 NOTE — Telephone Encounter (Signed)
Next apt 10/2019 refills?

## 2019-06-27 NOTE — Telephone Encounter (Signed)
Patient sees dr. Clovis Pu not mozingo. So please send in this script to [facility] and leave at 4 pills daily. Please call it under Dr. Clovis Pu

## 2019-08-18 ENCOUNTER — Other Ambulatory Visit: Payer: Self-pay | Admitting: Psychiatry

## 2019-08-18 DIAGNOSIS — F411 Generalized anxiety disorder: Secondary | ICD-10-CM

## 2019-08-18 DIAGNOSIS — Z8659 Personal history of other mental and behavioral disorders: Secondary | ICD-10-CM

## 2019-08-18 NOTE — Telephone Encounter (Signed)
Refills okay, due back 04/13

## 2019-09-11 ENCOUNTER — Telehealth: Payer: Self-pay | Admitting: *Deleted

## 2019-09-11 NOTE — Telephone Encounter (Signed)
FYI.  Per call patient was advised to call poison control.

## 2019-09-11 NOTE — Telephone Encounter (Signed)
Return Phone Number [phone] (Primary) Chief Complaint OVERDOSE took too much medication at once Reason for Call Symptomatic / Request for Health Information Initial Comment Caller reports taking her thyroid medication this morning and then took it again. Caller is concerned if this will be a problem Translation No Nurse Assessment Nurse: Fransisco Hertz, RN, Elnita Maxwell Date/Time (Eastern Time): 09/08/2019 3:26:18 PM Confirm and document reason for call. If symptomatic, describe symptoms. ---Caller reports taking her thyroid medication this morning and then took it again. She takes 45 mcg. Caller is concerned if this will be a problem Armorthyroid She is feeling a little hyped up She took the first dose this am and then second one at 2pm.  Disp. Time [name] Disposition Final User 09/08/2019 3:21:07 PM Send to Urgent Rick Duff 09/08/2019 3:32:41 PM Call Poison Center Now Yes Fransisco Hertz, RN, [place]

## 2019-10-17 ENCOUNTER — Ambulatory Visit (INDEPENDENT_AMBULATORY_CARE_PROVIDER_SITE_OTHER): Payer: Self-pay | Admitting: Psychiatry

## 2019-10-17 ENCOUNTER — Encounter: Payer: Self-pay | Admitting: Psychiatry

## 2019-10-17 DIAGNOSIS — F411 Generalized anxiety disorder: Secondary | ICD-10-CM

## 2019-10-17 DIAGNOSIS — F900 Attention-deficit hyperactivity disorder, predominantly inattentive type: Secondary | ICD-10-CM

## 2019-10-17 DIAGNOSIS — Z8659 Personal history of other mental and behavioral disorders: Secondary | ICD-10-CM

## 2019-10-17 MED ORDER — ALPRAZOLAM 0.5 MG PO TABS: 0.5000 mg | ORAL_TABLET | Freq: Four times a day (QID) | ORAL | 5 refills | Status: DC

## 2019-10-17 MED ORDER — DULOXETINE HCL 60 MG PO CPEP: 60.0000 mg | ORAL_CAPSULE | Freq: Every day | ORAL | 1 refills | Status: DC

## 2019-10-17 NOTE — Progress Notes (Signed)
Jenna Arroyo [id number] 09/20/1965 54 y.o.  Virtual Visit via Telephone Note  I connected with pt by telephone and verified that I am speaking with the correct person using two identifiers.   I discussed the limitations, risks, security and privacy concerns of performing an evaluation and management service by telephone and the availability of in person appointments. I also discussed with the patient that there may be a patient responsible charge related to this service. The patient expressed understanding and agreed to proceed.  I discussed the assessment and treatment plan with the patient. The patient was provided an opportunity to ask questions and all were answered. The patient agreed with the plan and demonstrated an understanding of the instructions.   The patient was advised to call back or seek an in-person evaluation if the symptoms worsen or if the condition fails to improve as anticipated.  I provided 15 minutes of non-face-to-face time during this encounter. The call started at 1 and ended at 115. The patient was located at home and the provider was located office.   Subjective:   Patient ID:  Jenna Arroyo is a 54 y.o. (DOB 1966/01/24) female.  Chief Complaint:  Chief Complaint  Patient presents with  . Follow-up  . Anxiety  . ADHD    Anxiety Symptoms include nervous/anxious behavior. Patient reports no confusion, decreased concentration or suicidal ideas.     [name] presents to the office today for follow-up of anxiety and mood.  Last seen October 2020.  She had been having cognitive complaints as previously noted. Still recommend trial switch to Ativan 1 mg QID to see if cognition is better.  Her cognitive problems have frequently been in a contributor to her difficulty maintaining employment.  She is fearful about having Xanax withdrawal.  This was discussed in detail.  Also discussed in detail the potential sedative effects of benzodiazepines and especially in  the first week of the transition from 1 of the medications to another.  It is likely that she will be more successful with the transition if she is a little more sleepy than normal the first week by using half of each medication then if we abruptly switch medications.  That has a much lower success rate in her case because of her worry about the transition. She agrees to a trial.  Will do 1/2 of 0.5 mg tablet tablet of Xanax and 1/2 of 1 mg tablet Ativan QID for 1 week. Then DC alprazolam and take Ativan 1 mg 3 times daily or 4 times daily to control anxiety.  Call back in 2 to 3 weeks to give a report of how well she is doing with the new medication. DC hydroxyzine because of its potential to cause cognitive problems. Continue duloxetine 60 mg daily.  She had difficulty implementing the transition and then complained of insomnia with lorazepam and was switched back to alprazolam after a week.  Also CO pain and nausea.    Overall doing OK except son and D upset with her since December.  Disc this in detail.  Hasn't seen GD's since then.  Working at job she loves but very PT. Exercising and lost 18#  Patient reports stable mood and denies depressed or irritable moods.  Patient denies any recent difficulty with anxiety.  sleep affected by smokers around her apt.  Denies appetite disturbance.  Patient reports that energy and motivation have been good.  Patient denies any difficulty with concentration.  Patient denies any suicidal ideation.  No panic.  Anxiety manageable.  Using Xanax mostly TID.  On duloxetine.  Sleep better in the day.    No questions concerns re: meds.  Past Psychiatric Medication Trials: Duloxetine 60, imipramine, desipramine, sertraline hyper, citalopram side effects, paroxetine chest pain, Nuvigil edgy, buspirone dizzy, Xanax, Ambien anxiety, trazodone side effects, atenolol  Review of Systems:  Review of Systems  Constitutional: Positive for fatigue.       Low exercise  tolerance  Gastrointestinal: Negative for vomiting.  Musculoskeletal: Positive for arthralgias and neck pain.  Neurological: Positive for headaches. Negative for tremors and weakness.       Foggy headed.  Psychiatric/Behavioral: Positive for sleep disturbance. Negative for agitation, behavioral problems, confusion, decreased concentration, dysphoric mood, hallucinations, self-injury and suicidal ideas. The patient is nervous/anxious. The patient is not hyperactive.     Medications: I have reviewed the patient's current medications.  Current Outpatient Medications  Medication Sig Dispense Refill  . albuterol (PROVENTIL HFA;VENTOLIN HFA) 108 (90 Base) MCG/ACT inhaler Inhale 1-2 puffs into the lungs every 6 (six) hours as needed. 1 Inhaler 0  . ALPRAZolam (XANAX) 0.5 MG tablet Take 1 tablet (0.5 mg total) by mouth in the morning, at noon, in the evening, and at bedtime. 120 tablet 5  . ARMOUR THYROID 15 MG tablet Take 45 mg (#3 15mg  tabs) daily 90 tablet 5  . atenolol (TENORMIN) 25 MG tablet Take 1 tablet (25 mg total) by mouth 2 (two) times daily. 180 tablet 2  . azelastine (ASTELIN) 0.1 % nasal spray Place 2 sprays into both nostrils 2 (two) times daily. Use in each nostril as directed 30 mL 1  . Charcoal 200 MG CAPS Take by mouth every other day.    . charcoal activated, NO SORBITOL, (ACTIDOSE-AQUA) suspension Take 50 g by mouth once. 200 mg    . Cholecalciferol (VITAMIN D) 10 MCG/ML LIQD Take by mouth.    . cholestyramine (QUESTRAN) 4 GM/DOSE powder BEGIN WITH 1/4 TSP QOD. FOLLOW PROTOCOL  1  . clotrimazole (LOTRIMIN) 1 % cream Apply to affected area 2 times daily for 2 weeks 15 g 0  . ELDERBERRY PO Take by mouth.    . fluticasone (FLOVENT HFA) 110 MCG/ACT inhaler Inhale 2 puffs into the lungs 2 (two) times daily. 1 Inhaler 12  . irbesartan (AVAPRO) 150 MG tablet Take 1 tablet (150 mg total) by mouth daily. 1 tablet in the morning and 1/2 tablet in the evening 90 tablet 2  . montelukast  (SINGULAIR) 10 MG tablet Take 1 tablet (10 mg total) by mouth at bedtime. 30 tablet 0  . pantoprazole (PROTONIX) 40 MG tablet Take 1 tablet (40 mg total) by mouth 2 (two) times daily. Take Protonix 40 mg BID for 8 weeks,take this medication 30 mins to 1 hour prior to morning and evening meals. Then decrease to once daily until return office appointment. 150 tablet 0  . PREBIOTIC PRODUCT PO Take by mouth. Mix in shake and drink once a day.    . DULoxetine (CYMBALTA) 60 MG capsule Take 1 capsule (60 mg total) by mouth daily. 90 capsule 1   No current facility-administered medications for this visit.    Medication Side Effects: None  Allergies:  Allergies  Allergen Reactions  . Ceftriaxone Sodium In Dextrose Other (See Comments)    "heart races and cannot breathe"  . Guaifenesin & Derivatives Other (See Comments)    "heart races, dizziness, light-headedness"  . Meperidine Other (See Comments)    "heart races"  .  Other Rash  . Penicillins Hives  . Pseudoephedrine Other (See Comments)    "heart races and high blood pressure"  . Rosuvastatin Calcium Nausea And Vomiting    Muscle aches   . Simvastatin Other (See Comments)    muscle aching   . Ciprofloxacin   . Rocephin [Ceftriaxone] Itching    Past Medical History:  Diagnosis Date  . Anxiety   . Hypertension   . Lyme disease   . Thyroid disease     Family History  Problem Relation Age of Onset  . Hypertension Mother   . Heart disease Father   . Hypertension Sister   . Hypertension Brother   . Colon cancer Neg Hx     Social History   Socioeconomic History  . Marital status: Divorced    Spouse name: Not on file  . Number of children: Not on file  . Years of education: Not on file  . Highest education level: Not on file  Occupational History  . Not on file  Tobacco Use  . Smoking status: Never Smoker  . Smokeless tobacco: Never Used  Substance and Sexual Activity  . Alcohol use: No  . Drug use: No  . Sexual  activity: Not Currently  Other Topics Concern  . Not on file  Social History Narrative  . Not on file   Social Determinants of Health   Financial Resource Strain:   . Difficulty of Paying Living Expenses:   Food Insecurity:   . Worried About Programme researcher, broadcasting/film/video in the Last Year:   . Barista in the Last Year:   Transportation Needs:   . Freight forwarder (Medical):   Marland Kitchen Lack of Transportation (Non-Medical):   Physical Activity:   . Days of Exercise per Week:   . Minutes of Exercise per Session:   Stress:   . Feeling of Stress :   Social Connections:   . Frequency of Communication with Friends and Family:   . Frequency of Social Gatherings with Friends and Family:   . Attends Religious Services:   . Active Member of Clubs or Organizations:   . Attends Banker Meetings:   Marland Kitchen Marital Status:   Intimate Partner Violence:   . Fear of Current or Ex-Partner:   . Emotionally Abused:   Marland Kitchen Physically Abused:   . Sexually Abused:     Past Medical History, Surgical history, Social history, and Family history were reviewed and updated as appropriate.   Please see review of systems for further details on the patient's review from today.   Objective:   Physical Exam:  There were no vitals taken for this visit.  Physical Exam Neurological:     Mental Status: She is alert and oriented to person, place, and time.     Cranial Nerves: No dysarthria.  Psychiatric:        Attention and Perception: She is inattentive.        Mood and Affect: Mood is anxious. Affect is not tearful.        Speech: Speech normal.        Behavior: Behavior is cooperative.        Thought Content: Thought content normal. Thought content is not paranoid or delusional. Thought content does not include homicidal or suicidal ideation. Thought content does not include homicidal or suicidal plan.        Cognition and Memory: Cognition normal.        Judgment: Judgment normal.  Comments:  Insight fair.  Patient has chronically low stress tolerance. Complain of chronic forgetfulness     Lab Review:     Component Value Date/Time   NA 138 04/13/2019 1411   K 4.6 04/13/2019 1411   CL 100 04/13/2019 1411   CO2 31 04/13/2019 1411   GLUCOSE 80 04/13/2019 1411   BUN 12 04/13/2019 1411   CREATININE 0.86 04/13/2019 1411   CREATININE 0.90 12/11/2016 1613   CALCIUM 9.5 04/13/2019 1411   PROT 6.6 04/13/2019 1411   ALBUMIN 4.1 04/13/2019 1411   AST 19 04/13/2019 1411   ALT 36 (H) 04/13/2019 1411   ALKPHOS 96 04/13/2019 1411   BILITOT 0.4 04/13/2019 1411   GFRNONAA >60 07/14/2018 1647   GFRAA >60 07/14/2018 1647       Component Value Date/Time   WBC 8.3 04/13/2019 1411   RBC 4.57 04/13/2019 1411   HGB 13.9 04/13/2019 1411   HCT 40.8 04/13/2019 1411   PLT 326.0 04/13/2019 1411   MCV 89.3 04/13/2019 1411   MCH 30.4 07/14/2018 1647   MCHC 34.2 04/13/2019 1411   RDW 13.4 04/13/2019 1411   LYMPHSABS 1.9 03/02/2017 1301   MONOABS 0.4 03/02/2017 1301   EOSABS 0.3 03/02/2017 1301   BASOSABS 0.1 03/02/2017 1301    No results found for: POCLITH, LITHIUM   No results found for: PHENYTOIN, PHENOBARB, VALPROATE, CBMZ   .res Assessment: Plan:   Generalized anxiety disorder, manageable  Attention deficit hyperactivity disorder improved with duloxetine  History of panic attacks in remission with current meds  Overall patient is satisfied with current meds and benefit.  She failed attempted transition from alprazolam to lorazepam in October 2020.  It appears she had some withdrawal symptoms and she is gone back to alprazolam.  It was discussed with her that we could do the transition more gradually if she wanted to pursue that to see if she can get cognitive benefit with the switch.  She does not want to do that at this time.  We discussed the short-term risks associated with benzodiazepines including sedation and increased fall risk among others.  Discussed long-term side  effect risk including dependence, potential withdrawal symptoms, and the potential eventual dose-related risk of dementia as well as short-term cognitive problems..  FU 6 mos  Lynder Parents, MD, DFAPA   Please see After Visit Summary for patient specific instructions.  No future appointments.  No orders of the defined types were placed in this encounter.     -------------------------------

## 2019-10-26 ENCOUNTER — Other Ambulatory Visit: Payer: Self-pay

## 2019-10-26 ENCOUNTER — Encounter: Payer: Self-pay | Admitting: Family Medicine

## 2019-10-26 DIAGNOSIS — I10 Essential (primary) hypertension: Secondary | ICD-10-CM

## 2019-10-26 MED ORDER — IRBESARTAN 150 MG PO TABS: 150.0000 mg | ORAL_TABLET | Freq: Every day | ORAL | 2 refills | Status: DC

## 2019-10-28 ENCOUNTER — Other Ambulatory Visit: Payer: Self-pay | Admitting: Family Medicine

## 2019-11-06 ENCOUNTER — Telehealth (INDEPENDENT_AMBULATORY_CARE_PROVIDER_SITE_OTHER): Payer: Self-pay | Admitting: Family Medicine

## 2019-11-06 ENCOUNTER — Encounter: Payer: Self-pay | Admitting: Family Medicine

## 2019-11-06 DIAGNOSIS — J0110 Acute frontal sinusitis, unspecified: Secondary | ICD-10-CM

## 2019-11-06 MED ORDER — DOXYCYCLINE HYCLATE 100 MG PO CAPS: 100.0000 mg | ORAL_CAPSULE | Freq: Two times a day (BID) | ORAL | 0 refills | Status: DC

## 2019-11-06 NOTE — Progress Notes (Signed)
[place] at [facility] [address] [place], [place] [address] [phone]  Date:  11/06/2019   Name:  Jenna Arroyo   DOB:  02/28/1966   MRN:  [id number]  PCP:  Darreld Mclean, MD    Chief Complaint: No chief complaint on file.   History of Present Illness:  Jenna Arroyo is a 54 y.o. very pleasant female patient who presents with the following:  Virtual visit today for concern of illness.  Patient location is home, provider location is office.  Patient identity from 2 factors, she gives consent for virtual visit today.  The patient myself are present on call today  Mendi notes symptoms of cough for about 4-5 days She is doing some walking after work each day- there is a lot of pollen in the air  She is not sure if this is allergies or illness She notes some sneezing, not runny nose. Some nasal congestion She has felt tired and run down She has noted temp of 98.9- warmer than her normal She is not sure if she has any chills as the [facility] is on No vomiting or diarrhea  She has not had covid 19 so far as far as we know, she has not been vaccinated   Last seen by myself in October.  She has history of hypertension, hypothyroidism, dyslipidemia  May have some unusual health beliefs at times Today she is concerned that covid 19 tests are "tainted" with covid 19 which she read online- I reassured her that this is false information and that getting tested for covid 19 cannot transmit the infection  Patient Active Problem List   Diagnosis Date Noted  . History of panic attacks 01/23/2019  . GAD (generalized anxiety disorder) 04/22/2018  . ADD (attention deficit disorder) 04/22/2018  . Axillary lymphadenopathy 12/11/2016  . Lyme disease 12/11/2016  . Dyslipidemia 06/18/2016  . Essential hypertension, benign 06/18/2016  . Acquired hypothyroidism 06/18/2016    Past Medical History:  Diagnosis Date  . Anxiety   . Hypertension    . Lyme disease   . Thyroid disease     Past Surgical History:  Procedure Laterality Date  . ABDOMINAL EXPLORATION SURGERY    . APPENDECTOMY    . MASTOIDECTOMY    . TONSILECTOMY/ADENOIDECTOMY WITH MYRINGOTOMY      Social History   Tobacco Use  . Smoking status: Never Smoker  . Smokeless tobacco: Never Used  Substance Use Topics  . Alcohol use: No  . Drug use: No    Family History  Problem Relation Age of Onset  . Hypertension Mother   . Heart disease Father   . Hypertension Sister   . Hypertension Brother   . Colon cancer Neg Hx     Allergies  Allergen Reactions  . Ceftriaxone Sodium In Dextrose Other (See Comments)    "heart races and cannot breathe"  . Guaifenesin & Derivatives Other (See Comments)    "heart races, dizziness, light-headedness"  . Meperidine Other (See Comments)    "heart races"  . Other Rash  . Penicillins Hives  . Pseudoephedrine Other (See Comments)    "heart races and high blood pressure"  . Rosuvastatin Calcium Nausea And Vomiting    Muscle aches   . Simvastatin Other (See Comments)    muscle aching   . Ciprofloxacin   . Rocephin [Ceftriaxone] Itching    Medication list has been reviewed and updated.  Current Outpatient Medications on  File Prior to Visit  Medication Sig Dispense Refill  . albuterol (PROVENTIL HFA;VENTOLIN HFA) 108 (90 Base) MCG/ACT inhaler Inhale 1-2 puffs into the lungs every 6 (six) hours as needed. 1 Inhaler 0  . ALPRAZolam (XANAX) 0.5 MG tablet Take 1 tablet (0.5 mg total) by mouth in the morning, at noon, in the evening, and at bedtime. 120 tablet 5  . ARMOUR THYROID 15 MG tablet Take 45 mg (#3 15mg  tabs) daily 90 tablet 5  . atenolol (TENORMIN) 25 MG tablet Take 1 tablet (25 mg total) by mouth 2 (two) times daily. 180 tablet 2  . azelastine (ASTELIN) 0.1 % nasal spray Place 2 sprays into both nostrils 2 (two) times daily. Use in each nostril as directed 30 mL 1  . Charcoal 200 MG CAPS Take by mouth every other  day.    . charcoal activated, NO SORBITOL, (ACTIDOSE-AQUA) suspension Take 50 g by mouth once. 200 mg    . Cholecalciferol (VITAMIN D) 10 MCG/ML LIQD Take by mouth.    . cholestyramine (QUESTRAN) 4 GM/DOSE powder BEGIN WITH 1/4 TSP QOD. FOLLOW PROTOCOL  1  . clotrimazole (LOTRIMIN) 1 % cream Apply to affected area 2 times daily for 2 weeks 15 g 0  . DULoxetine (CYMBALTA) 60 MG capsule Take 1 capsule (60 mg total) by mouth daily. 90 capsule 1  . ELDERBERRY PO Take by mouth.    . fluticasone (FLOVENT HFA) 110 MCG/ACT inhaler Inhale 2 puffs into the lungs 2 (two) times daily. 1 Inhaler 12  . irbesartan (AVAPRO) 150 MG tablet Take 1 tablet (150 mg total) by mouth daily. 1 tablet in the morning and 1/2 tablet in the evening 90 tablet 2  . montelukast (SINGULAIR) 10 MG tablet Take 1 tablet (10 mg total) by mouth at bedtime. 30 tablet 0  . pantoprazole (PROTONIX) 40 MG tablet Take 1 tablet (40 mg total) by mouth 2 (two) times daily. Take Protonix 40 mg BID for 8 weeks,take this medication 30 mins to 1 hour prior to morning and evening meals. Then decrease to once daily until return office appointment. 150 tablet 0  . PREBIOTIC PRODUCT PO Take by mouth. Mix in shake and drink once a day.     No current facility-administered medications on file prior to visit.    Review of Systems:  As per HPI- otherwise negative.   Physical Examination: There were no vitals filed for this visit. There were no vitals filed for this visit. There is no height or weight on file to calculate BMI. Ideal Body Weight:    Pt observed via video monitor Her BP cuff is not working right now She looks well, her normal self  Assessment and Plan: Acute non-recurrent frontal sinusitis - Plan: doxycycline (VIBRAMYCIN) 100 MG capsule  Virtual visit today for likely sinus infection vs covid 19 vs other virus vs allergy Will start her on doxycycline for 10 days- take with food and water For advised her to self isolate until  she has tested negative for COVID-19.  We discussed how and where to be tested for COVID-19, I again I reassured her that testing will not transmit Covid.  I have asked her let me know if not feeling better or if feeling worse over the next few days    Signed , MD

## 2019-11-07 ENCOUNTER — Telehealth: Payer: Self-pay | Admitting: Family Medicine

## 2019-11-07 ENCOUNTER — Encounter: Payer: Self-pay | Admitting: Family Medicine

## 2019-11-07 MED ORDER — PROMETHAZINE HCL 12.5 MG PO TABS
12.5000 mg | ORAL_TABLET | Freq: Three times a day (TID) | ORAL | 0 refills | Status: DC | PRN
Start: 2019-11-07 — End: 2020-02-08

## 2019-11-07 NOTE — Telephone Encounter (Signed)
Pt states he Covid results were negative. She is request for meds to help with nausea. She wants to know if something can be sent to pharm. She states she cant take Zofran it gives her a bad headache. She prefers to take phenergan. Please advise. Thanks

## 2019-11-11 ENCOUNTER — Other Ambulatory Visit: Payer: Self-pay | Admitting: Family Medicine

## 2019-11-11 DIAGNOSIS — I10 Essential (primary) hypertension: Secondary | ICD-10-CM

## 2019-11-16 ENCOUNTER — Telehealth: Payer: Self-pay | Admitting: Family Medicine

## 2019-11-16 NOTE — Telephone Encounter (Signed)
Patient was seeing via a virtual visit on 11/06/19 was prescribe Doxycycline..Patient states that medicine upset her stomach really bad she is wondering if a different medicine could be prescribe.  Pt uses  [id number] - [place], [place] - [phone]  [address], [place] [address]    Pt phone number [phone].  Please advise

## 2019-11-17 ENCOUNTER — Encounter: Payer: Self-pay | Admitting: Family Medicine

## 2019-12-02 ENCOUNTER — Other Ambulatory Visit: Payer: Self-pay | Admitting: Family Medicine

## 2019-12-06 ENCOUNTER — Telehealth: Payer: Self-pay | Admitting: Family Medicine

## 2019-12-06 NOTE — Telephone Encounter (Signed)
CallerLeith Hedlund  Call Back # [phone]  Subject : Retaining Fluid   Patient states that she is retaining fluid, patient states that she hasn't done that in years. Please Advise

## 2019-12-06 NOTE — Telephone Encounter (Signed)
Patient coming in for appt on 6/7 to discuss

## 2019-12-10 NOTE — Progress Notes (Addendum)
[facility] [place], [place], [place] [address] [phone]  Date:  12/11/2019   Name:  Jenna Arroyo   DOB:  [birth year]   MRN:  [id number]  PCP:  Darreld Mclean, MD    Chief Complaint: Foot Swelling (right foot swelling, no known injury)   History of Present Illness:  Jenna Arroyo is a 54 y.o. very pleasant female patient who presents with the following:  Patient with history of hypertension, hypothyroidism, dyslipidemia, anxiety Here today with concern of ankle swelling and into her legs- bilaterally, possibly worse in the right foot  She would tend to notice this more towards the end of the day She might have some mild discomfort but not really pain She did use a fluid pill at some point in the past for ankle swelling- she cannot recall exactly what she was taking at that time.  Per her recollection, she used it as needed  She has been doing a lot more walking recently for exercise- she has been walking up to about 6 miles a day She has lost some weight However recently she has cut back on her walking, it seems to cause some plantar fasciitis and also she heard that it could decrease her T3?   Wt Readings from Last 3 Encounters:  12/11/19 215 lb (97.5 kg)  04/13/19 224 lb (101.6 kg)  04/02/19 224 lb 13.9 oz (102 kg)   I saw her most recently for a virtual visit last month-sinusitis  Colon cancer screening-she would like to use Cologuard, order for her today Mammogram-ordered Pap is up-to-date Most recent labs done in October  Patient Active Problem List   Diagnosis Date Noted  . History of panic attacks 01/23/2019  . GAD (generalized anxiety disorder) 04/22/2018  . ADD (attention deficit disorder) 04/22/2018  . Axillary lymphadenopathy 12/11/2016  . Lyme disease 12/11/2016  . Dyslipidemia 06/18/2016  . Essential hypertension, benign 06/18/2016  . Acquired hypothyroidism 06/18/2016    Past  Medical History:  Diagnosis Date  . Anxiety   . Hypertension   . Lyme disease   . Thyroid disease     Past Surgical History:  Procedure Laterality Date  . ABDOMINAL EXPLORATION SURGERY    . APPENDECTOMY    . MASTOIDECTOMY    . TONSILECTOMY/ADENOIDECTOMY WITH MYRINGOTOMY      Social History   Tobacco Use  . Smoking status: Never Smoker  . Smokeless tobacco: Never Used  Substance Use Topics  . Alcohol use: No  . Drug use: No    Family History  Problem Relation Age of Onset  . Hypertension Mother   . Heart disease Father   . Hypertension Sister   . Hypertension Brother   . Colon cancer Neg Hx     Allergies  Allergen Reactions  . Ceftriaxone Sodium In Dextrose Other (See Comments)    "heart races and cannot breathe"  . Guaifenesin & Derivatives Other (See Comments)    "heart races, dizziness, light-headedness"  . Meperidine Other (See Comments)    "heart races"  . Other Rash  . Penicillins Hives  . Pseudoephedrine Other (See Comments)    "heart races and high blood pressure"  . Rosuvastatin Calcium Nausea And Vomiting    Muscle aches   . Simvastatin Other (See Comments)    muscle aching   . Ciprofloxacin   . Rocephin [Ceftriaxone] Itching    Medication list has been reviewed and updated.  Current Outpatient Medications on File Prior to Visit  Medication Sig Dispense Refill  . albuterol (PROVENTIL HFA;VENTOLIN HFA) 108 (90 Base) MCG/ACT inhaler Inhale 1-2 puffs into the lungs every 6 (six) hours as needed. 1 Inhaler 0  . ALPRAZolam (XANAX) 0.5 MG tablet Take 1 tablet (0.5 mg total) by mouth in the morning, at noon, in the evening, and at bedtime. 120 tablet 5  . ARMOUR THYROID 15 MG tablet Take 3 tablets by mouth once daily 90 tablet 1  . atenolol (TENORMIN) 25 MG tablet Take 1 tablet (25 mg total) by mouth 2 (two) times daily. 180 tablet 2  . azelastine (ASTELIN) 0.1 % nasal spray Place 2 sprays into both nostrils 2 (two) times daily. Use in each nostril as  directed 30 mL 1  . Charcoal 200 MG CAPS Take by mouth every other day.    . charcoal activated, NO SORBITOL, (ACTIDOSE-AQUA) suspension Take 50 g by mouth once. 200 mg    . Cholecalciferol (VITAMIN D) 10 MCG/ML LIQD Take by mouth.    . cholestyramine (QUESTRAN) 4 GM/DOSE powder BEGIN WITH 1/4 TSP QOD. FOLLOW PROTOCOL  1  . clotrimazole (LOTRIMIN) 1 % cream Apply to affected area 2 times daily for 2 weeks 15 g 0  . doxycycline (VIBRAMYCIN) 100 MG capsule Take 1 capsule (100 mg total) by mouth 2 (two) times daily. 20 capsule 0  . DULoxetine (CYMBALTA) 60 MG capsule Take 1 capsule (60 mg total) by mouth daily. 90 capsule 1  . ELDERBERRY PO Take by mouth.    . fluticasone (FLOVENT HFA) 110 MCG/ACT inhaler Inhale 2 puffs into the lungs 2 (two) times daily. 1 Inhaler 12  . irbesartan (AVAPRO) 150 MG tablet TAKE 1 TABLET BY MOUTH IN THE MORNING AND ONE HALF TABLET IN THE EVENING 90 tablet 1  . montelukast (SINGULAIR) 10 MG tablet Take 1 tablet (10 mg total) by mouth at bedtime. 30 tablet 0  . pantoprazole (PROTONIX) 40 MG tablet Take 1 tablet (40 mg total) by mouth 2 (two) times daily. Take Protonix 40 mg BID for 8 weeks,take this medication 30 mins to 1 hour prior to morning and evening meals. Then decrease to once daily until return office appointment. 150 tablet 0  . PREBIOTIC PRODUCT PO Take by mouth. Mix in shake and drink once a day.    . promethazine (PHENERGAN) 12.5 MG tablet Take 1 tablet (12.5 mg total) by mouth every 8 (eight) hours as needed for nausea or vomiting. 20 tablet 0   No current facility-administered medications on file prior to visit.    Review of Systems:  As per HPI- otherwise negative.   Physical Examination: Vitals:   12/11/19 1348  BP: 110/82  Pulse: 90  Resp: 16  SpO2: 98%   Vitals:   12/11/19 1348  Weight: 215 lb (97.5 kg)  Height: 5\' 5"  (1.651 m)   Body mass index is 35.78 kg/m. Ideal Body Weight: Weight in (lb) to have BMI = 25: 149.9  GEN: no  acute distress.  Obese, otherwise looks well HEENT: Atraumatic, Normocephalic.  Ears and Nose: No external deformity. CV: RRR, No M/G/R. No JVD. No thrill. No extra heart sounds. PULM: CTA B, no wheezes, crackles, rhonchi. No retractions. No resp. distress. No accessory muscle use. ABD: S, NT, ND, +BS. No rebound. No HSM. EXTR: No c/c/e.  No significant edema noted in either foot, ankle, lower leg PSYCH: Normally interactive. Conversant.    Assessment and Plan: Lower extremity edema - Plan:  CBC, Comprehensive metabolic panel, Microalbumin / creatinine urine ratio, B Nat Peptide, hydrochlorothiazide (MICROZIDE) 12.5 MG capsule  Essential hypertension, benign - Plan: CBC, Comprehensive metabolic panel  Screening for colon cancer  Acquired hypothyroidism - Plan: TSH, T3, free  Encounter for screening mammogram for malignant neoplasm of breast - Plan: MM DIAG BREAST TOMO BILATERAL   Here today with concern of bilateral lower extremity edema, typically worse in the afternoon.  Likely related to venous insufficiency.  Lab work-up pending as above, prescribed HCTZ for to use as needed for swelling Order mammogram Order Cologuard for colon cancer screening  Will plan further follow- up pending labs.   This visit occurred during the SARS-CoV-2 public health emergency.  Safety protocols were in place, including screening questions prior to the visit, additional usage of staff PPE, and extensive cleaning of exam room while observing appropriate contact time as indicated for disinfecting solutions.    Signed Abbe Amsterdam, MD  6/8- received her labs as follows  Results for orders placed or performed in visit on 12/11/19  CBC  Result Value Ref Range   WBC 8.1 4.0 - 10.5 K/uL   RBC 4.50 3.87 - 5.11 Mil/uL   Platelets 334.0 150.0 - 400.0 K/uL   Hemoglobin 13.9 12.0 - 15.0 g/dL   HCT 85.4 62.7 - 03.5 %   MCV 90.4 78.0 - 100.0 fl   MCHC 34.1 30.0 - 36.0 g/dL   RDW 00.9 38.1 - 82.9 %   Comprehensive metabolic panel  Result Value Ref Range   Sodium 134 (L) 135 - 145 mEq/L   Potassium 4.9 3.5 - 5.1 mEq/L   Chloride 98 96 - 112 mEq/L   CO2 32 19 - 32 mEq/L   Glucose, Bld 101 (H) 70 - 99 mg/dL   BUN 17 6 - 23 mg/dL   Creatinine, Ser 9.37 0.40 - 1.20 mg/dL   Total Bilirubin 0.4 0.2 - 1.2 mg/dL   Alkaline Phosphatase 99 39 - 117 U/L   AST 13 0 - 37 U/L   ALT 17 0 - 35 U/L   Total Protein 6.5 6.0 - 8.3 g/dL   Albumin 4.1 3.5 - 5.2 g/dL   GFR 16.96 >78.93 mL/min   Calcium 9.3 8.4 - 10.5 mg/dL  Microalbumin / creatinine urine ratio  Result Value Ref Range   Microalb, Ur <0.7 0.0 - 1.9 mg/dL   Creatinine,U 81.0 mg/dL   Microalb Creat Ratio 2.5 0.0 - 30.0 mg/g  TSH  Result Value Ref Range   TSH 2.73 0.35 - 4.50 uIU/mL  T3, free  Result Value Ref Range   T3, Free 3.4 2.3 - 4.2 pg/mL  B Nat Peptide  Result Value Ref Range   Pro B Natriuretic peptide (BNP) 33.0 0.0 - 100.0 pg/mL   Message to pt

## 2019-12-11 ENCOUNTER — Ambulatory Visit (INDEPENDENT_AMBULATORY_CARE_PROVIDER_SITE_OTHER): Payer: Self-pay | Admitting: Family Medicine

## 2019-12-11 ENCOUNTER — Other Ambulatory Visit: Payer: Self-pay

## 2019-12-11 ENCOUNTER — Encounter: Payer: Self-pay | Admitting: Family Medicine

## 2019-12-11 VITALS — BP 110/82 | HR 90 | Resp 16 | Ht 65.0 in | Wt 215.0 lb

## 2019-12-11 DIAGNOSIS — E039 Hypothyroidism, unspecified: Secondary | ICD-10-CM

## 2019-12-11 DIAGNOSIS — R600 Localized edema: Secondary | ICD-10-CM

## 2019-12-11 DIAGNOSIS — Z1231 Encounter for screening mammogram for malignant neoplasm of breast: Secondary | ICD-10-CM

## 2019-12-11 DIAGNOSIS — I10 Essential (primary) hypertension: Secondary | ICD-10-CM

## 2019-12-11 DIAGNOSIS — Z1211 Encounter for screening for malignant neoplasm of colon: Secondary | ICD-10-CM

## 2019-12-11 LAB — MICROALBUMIN / CREATININE URINE RATIO
Creatinine,U: 28.3 mg/dL
Microalb Creat Ratio: 2.5 mg/g (ref 0.0–30.0)
Microalb, Ur: <0.7 mg/dL (ref 0.0–1.9)

## 2019-12-11 LAB — CBC
HCT: 40.7 % (ref 36.0–46.0)
Hemoglobin: 13.9 g/dL (ref 12.0–15.0)
MCHC: 34.1 g/dL (ref 30.0–36.0)
MCV: 90.4 fl (ref 78.0–100.0)
Platelets: 334 10*3/uL (ref 150.0–400.0)
RBC: 4.5 Mil/uL (ref 3.87–5.11)
RDW: 13.5 % (ref 11.5–15.5)
WBC: 8.1 10*3/uL (ref 4.0–10.5)

## 2019-12-11 LAB — T3, FREE: T3, Free: 3.4 pg/mL (ref 2.3–4.2)

## 2019-12-11 LAB — COMPREHENSIVE METABOLIC PANEL
ALT: 17 U/L (ref 0–35)
AST: 13 U/L (ref 0–37)
Albumin: 4.1 g/dL (ref 3.5–5.2)
Alkaline Phosphatase: 99 U/L (ref 39–117)
BUN: 17 mg/dL (ref 6–23)
CO2: 32 mEq/L (ref 19–32)
Calcium: 9.3 mg/dL (ref 8.4–10.5)
Chloride: 98 mEq/L (ref 96–112)
Creatinine, Ser: 0.87 mg/dL (ref 0.40–1.20)
GFR: 67.77 mL/min (ref >60.00)
Glucose, Bld: 101 mg/dL — ABNORMAL HIGH (ref 70–99)
Potassium: 4.9 mEq/L (ref 3.5–5.1)
Sodium: 134 mEq/L — ABNORMAL LOW (ref 135–145)
Total Bilirubin: 0.4 mg/dL (ref 0.2–1.2)
Total Protein: 6.5 g/dL (ref 6.0–8.3)

## 2019-12-11 LAB — TSH: TSH: 2.73 u[IU]/mL (ref 0.35–4.50)

## 2019-12-11 MED ORDER — HYDROCHLOROTHIAZIDE 12.5 MG PO CAPS: 12.5000 mg | ORAL_CAPSULE | Freq: Every day | ORAL | 3 refills | Status: DC

## 2019-12-11 NOTE — Patient Instructions (Signed)
Good to see you today- I will be in touch with your labs asap  We will order a Cologuard kit for you to screen for colon cancer Try the HCTZ once daily as needed for swelling We will set you up for a mammogram as well

## 2019-12-12 ENCOUNTER — Encounter: Payer: Self-pay | Admitting: Family Medicine

## 2019-12-12 LAB — BRAIN NATRIURETIC PEPTIDE: Pro B Natriuretic peptide (BNP): 33 pg/mL (ref 0.0–100.0)

## 2019-12-15 ENCOUNTER — Telehealth: Payer: Self-pay

## 2019-12-15 NOTE — Telephone Encounter (Signed)
Patient notified of lab results and verbalized understanding.  ° °

## 2019-12-15 NOTE — Telephone Encounter (Signed)
Patient called in to see if the nurse would give her a call to discuss her lab results. Per the patient she do not have access to her My.Chart to read the messages. Please give the patient a call back at [phone]

## 2019-12-22 ENCOUNTER — Other Ambulatory Visit: Payer: Self-pay | Admitting: Family Medicine

## 2019-12-22 DIAGNOSIS — I10 Essential (primary) hypertension: Secondary | ICD-10-CM

## 2019-12-27 ENCOUNTER — Ambulatory Visit: Payer: Self-pay

## 2020-01-03 ENCOUNTER — Ambulatory Visit (INDEPENDENT_AMBULATORY_CARE_PROVIDER_SITE_OTHER): Payer: Self-pay | Admitting: Medical

## 2020-01-03 ENCOUNTER — Other Ambulatory Visit: Payer: Self-pay

## 2020-01-03 VITALS — BP 118/67 | HR 88 | Resp 18 | Ht 66.0 in | Wt 216.0 lb

## 2020-01-03 DIAGNOSIS — L089 Local infection of the skin and subcutaneous tissue, unspecified: Secondary | ICD-10-CM

## 2020-01-03 DIAGNOSIS — J0110 Acute frontal sinusitis, unspecified: Secondary | ICD-10-CM

## 2020-01-03 DIAGNOSIS — W57XXXA Bitten or stung by nonvenomous insect and other nonvenomous arthropods, initial encounter: Secondary | ICD-10-CM

## 2020-01-03 DIAGNOSIS — S20169A Insect bite (nonvenomous) of breast, unspecified breast, initial encounter: Secondary | ICD-10-CM

## 2020-01-03 MED ORDER — DOXYCYCLINE HYCLATE 100 MG PO CAPS: 100.0000 mg | ORAL_CAPSULE | Freq: Two times a day (BID) | ORAL | 0 refills | Status: DC

## 2020-01-03 MED ORDER — METHYLPREDNISOLONE 4 MG PO TABS
ORAL_TABLET | ORAL | 0 refills | Status: DC
Start: 2020-01-03 — End: 2020-12-20

## 2020-01-03 NOTE — Progress Notes (Signed)
° °  Subjective:    Patient ID: Jenna Arroyo, female    DOB: 1966-02-09, 54 y.o.   MRN: [id number]  HPI  Pt states she has some redness to areas on breast. She states acute onset of sharp stinging type pain on Monday morning(descibes felt like wasp sting). She just walked out to car. She had just put on essential il  orange scent. The area does not itch. She put peroxide to area. Then used Tea tree oil. The area is still red and mild streaky.  Pt has gotten stung before by wasp and state that what it felt like. No sob, no wheezing, no lip swelling, no tongue swelling, weakness or syncope.  Pt went to ED. They thought maybe infection. Rx'd clindamycin. She expressed concern on strength of med and dose.  Pt has new bra one that she used for 4 months.   Review of Systems     Objective:   Physical Exam  General- No acute distress. Pleasant patient. Neck- Full range of motion, no jvd Lungs- Clear, even and unlabored. Heart- regular rate and rhythm. Neurologic- CNII- XII grossly intact.  Breast- both side have area ciruclar red with thin streak of redness up breast. Rt breast area where may have been stung has dry yellow dc. Left breast no yellow dc apparance. On palpation not tender. Dahlia Client MA with me on exam.      Assessment & Plan:  For allergic reaction with secondary infection I did rx low dose taper medrol and doxycycline. Rx advisement given for both.   If rash spread or worsens let us know.   Severe spread then ED evaluation. If any changes before Friday then we can try to get you in.  Follow up 5-7 days or prn.  Esperanza Richters, PA-C   Time spent with patient today was 32  minutes which consisted of chart review, discussing diagnosis, treatment, answering questions and documentation.

## 2020-01-03 NOTE — Patient Instructions (Addendum)
For allergic reaction with secondary infection I did rx low dose taper medrol and doxycycline. Rx advisement given for both.   If rash spread or worsens let us know.   Severe spread then ED evaluation. If any changes before Friday then we can try to get you in.  Follow up 5-7 days or prn.

## 2020-01-04 ENCOUNTER — Telehealth: Payer: Self-pay | Admitting: Family Medicine

## 2020-01-04 NOTE — Telephone Encounter (Signed)
Called her back as requested She was recently seen in the ER and by [name] yesterday for bee stings I did not reach her, so left detailed message on machine.  I do not think the steroids will affect her thyroid treatment.  It is true that steroids can increase anxiety in some people.  We have to balance the intended action of the steroid (decrease in allergic reaction and swelling to bee sting) with any side effects.  If she is not able to tolerate taking the steroid she can stop it early if necessary.  I have asked her to let me know if any concerns

## 2020-01-04 NOTE — Telephone Encounter (Signed)
Patient saw [name] on  06/30 for [employer]. He prescribed her methylPREDNISolone (MEDROL) 4 MG tablet  She wants to know [place] thoughts on the meds prescribed and if will effect any other med is is taking for her Thyroid and Anxiety. Please advise

## 2020-01-10 ENCOUNTER — Ambulatory Visit: Payer: Self-pay

## 2020-01-12 ENCOUNTER — Other Ambulatory Visit: Payer: Self-pay | Admitting: Psychiatry

## 2020-01-12 DIAGNOSIS — F411 Generalized anxiety disorder: Secondary | ICD-10-CM

## 2020-01-12 DIAGNOSIS — Z8659 Personal history of other mental and behavioral disorders: Secondary | ICD-10-CM

## 2020-01-18 ENCOUNTER — Ambulatory Visit: Payer: Self-pay

## 2020-01-25 ENCOUNTER — Ambulatory Visit: Payer: Self-pay

## 2020-02-01 ENCOUNTER — Emergency Department (HOSPITAL_BASED_OUTPATIENT_CLINIC_OR_DEPARTMENT_OTHER)
Admission: EM | Admit: 2020-02-01 | Discharge: 2020-02-01 | Disposition: A | Discharge status: Home / Self Care | Payer: Self-pay | Attending: Emergency Medicine | Admitting: Emergency Medicine

## 2020-02-01 ENCOUNTER — Encounter (HOSPITAL_BASED_OUTPATIENT_CLINIC_OR_DEPARTMENT_OTHER): Payer: Self-pay

## 2020-02-01 ENCOUNTER — Emergency Department (HOSPITAL_BASED_OUTPATIENT_CLINIC_OR_DEPARTMENT_OTHER): Payer: Self-pay

## 2020-02-01 ENCOUNTER — Other Ambulatory Visit: Payer: Self-pay

## 2020-02-01 DIAGNOSIS — E039 Hypothyroidism, unspecified: Secondary | ICD-10-CM | POA: Insufficient documentation

## 2020-02-01 DIAGNOSIS — I10 Essential (primary) hypertension: Secondary | ICD-10-CM | POA: Insufficient documentation

## 2020-02-01 DIAGNOSIS — Z79899 Other long term (current) drug therapy: Secondary | ICD-10-CM | POA: Insufficient documentation

## 2020-02-01 HISTORY — DX: Pure hypercholesterolemia, unspecified: E78.00

## 2020-02-01 LAB — BASIC METABOLIC PANEL
Anion gap: 11 (ref 5–15)
BUN: 12 mg/dL (ref 6–20)
CO2: 28 mmol/L (ref 22–32)
Calcium: 9.5 mg/dL (ref 8.9–10.3)
Chloride: 98 mmol/L (ref 98–111)
Creatinine, Ser: 0.82 mg/dL (ref 0.44–1.00)
GFR calc Af Amer: >60 mL/min (ref >60)
GFR calc non Af Amer: >60 mL/min (ref >60)
Glucose, Bld: 90 mg/dL (ref 70–99)
Potassium: 4.4 mmol/L (ref 3.5–5.1)
Sodium: 137 mmol/L (ref 135–145)

## 2020-02-01 LAB — CBC
HCT: 45.7 % (ref 36.0–46.0)
Hemoglobin: 15.3 g/dL — ABNORMAL HIGH (ref 12.0–15.0)
MCH: 30.2 pg (ref 26.0–34.0)
MCHC: 33.5 g/dL (ref 30.0–36.0)
MCV: 90.1 fL (ref 80.0–100.0)
Platelets: 338 10*3/uL (ref 150–400)
RBC: 5.07 MIL/uL (ref 3.87–5.11)
RDW: 13.1 % (ref 11.5–15.5)
WBC: 9.1 10*3/uL (ref 4.0–10.5)
nRBC: 0 % (ref 0.0–0.2)

## 2020-02-01 LAB — TROPONIN I (HIGH SENSITIVITY): Troponin I (High Sensitivity): <2 ng/L (ref <18)

## 2020-02-01 MED ORDER — IBUPROFEN 800 MG PO TABS: 800.0000 mg | ORAL_TABLET | Freq: Once | ORAL | Status: DC

## 2020-02-01 MED ORDER — ACETAMINOPHEN 500 MG PO TABS
1000.0000 mg | ORAL_TABLET | Freq: Once | ORAL | Status: AC
  Administered 2020-02-01: 1000 mg via ORAL
  Filled 2020-02-01: qty 2

## 2020-02-01 MED ORDER — SODIUM CHLORIDE 0.9% FLUSH
3.0000 mL | Freq: Once | INTRAVENOUS | Status: DC
  Filled 2020-02-01: qty 3

## 2020-02-01 NOTE — ED Triage Notes (Addendum)
Pt c/o elevated BP 2 days ago-also c/o CP x 1 week-also c/o HA and pain to both eyes x 2 days-reports hx of HTN with med compliant/seen at [facility] ED for same-NAD-steady gait

## 2020-02-01 NOTE — ED Provider Notes (Signed)
[facility] EMERGENCY DEPARTMENT Provider Note   CSN: [id number] Arrival date & time: 02/01/20  1356    History Chief Complaint  Patient presents with  . Hypertension   Jenna Arroyo is a 54 y.o. female.  Jenna Arroyo is 54yo female with HTN, anxiety, hypothyroidism who presents to ED with HA. Patient reports she went to ED 2 nights ago for elevated SBP in 160's. States she has had constant HA and eye pressure since then. Denies blurry or double vision. Reports CP from wasp stings x2 weeks and intermittent nausea x3 weeks. Denies fevers, SOB, abdominal pain, dysuria, weakness, numbness. She has chronic fatigue but no acute worsening. Says she has PCP appointment tomorrow.      Past Medical History:  Diagnosis Date  . Anxiety   . High cholesterol   . Hypertension   . Lyme disease   . Thyroid disease    Patient Active Problem List   Diagnosis Date Noted  . History of panic attacks 01/23/2019  . GAD (generalized anxiety disorder) 04/22/2018  . ADD (attention deficit disorder) 04/22/2018  . Axillary lymphadenopathy 12/11/2016  . Lyme disease 12/11/2016  . Dyslipidemia 06/18/2016  . Essential hypertension, benign 06/18/2016  . Acquired hypothyroidism 06/18/2016   Past Surgical History:  Procedure Laterality Date  . ABDOMINAL EXPLORATION SURGERY    . APPENDECTOMY    . MASTOIDECTOMY    . TONSILECTOMY/ADENOIDECTOMY WITH MYRINGOTOMY      OB History   No obstetric history on file.    Family History  Problem Relation Age of Onset  . Hypertension Mother   . Heart disease Father   . Hypertension Sister   . Hypertension Brother   . Colon cancer Neg Hx    Social History   Tobacco Use  . Smoking status: Never Smoker  . Smokeless tobacco: Never Used  Vaping Use  . Vaping Use: Never used  Substance Use Topics  . Alcohol use: No  . Drug use: No   Home Medications Prior to Admission medications   Medication Sig Start Date End Date Taking? Authorizing Provider    albuterol (PROVENTIL HFA;VENTOLIN HFA) 108 (90 Base) MCG/ACT inhaler Inhale 1-2 puffs into the lungs every 6 (six) hours as needed. 06/14/18   Nche, Bonna Gains, NP  ALPRAZolam Prudy Feeler) 0.5 MG tablet TAKE ONE TABLET BY MOUTH FOUR TIMES A DAY IN THE [place], AT NOON, IN THE EVENING, AND AT BEDTIME 01/16/20   Cottle, Steva Ready., MD  ARMOUR THYROID 15 MG tablet Take 3 tablets by mouth once daily 12/05/19   [place], Gwenlyn Found, MD  atenolol (TENORMIN) 25 MG tablet Take 1 tablet (25 mg total) by mouth 2 (two) times daily. 03/27/19   Cottle, Steva Ready., MD  azelastine (ASTELIN) 0.1 % nasal spray Place 2 sprays into both nostrils 2 (two) times daily. Use in each nostril as directed 01/13/19   Saguier, [name], PA-C  Charcoal 200 MG CAPS Take by mouth every other day.    [provider]  charcoal activated, NO SORBITOL, (ACTIDOSE-AQUA) suspension Take 50 g by mouth once. 200 mg    [provider]  Cholecalciferol (VITAMIN D) 10 MCG/ML LIQD Take by mouth.    [provider]  cholestyramine (QUESTRAN) 4 GM/DOSE powder BEGIN WITH 1/4 TSP QOD. FOLLOW PROTOCOL 01/12/18   [provider]  clotrimazole (LOTRIMIN) 1 % cream Apply to affected area 2 times daily for 2 weeks 10/16/18   Robinson, [place] N, PA-C  doxycycline (VIBRAMYCIN) 100 MG capsule  Take 1 capsule (100 mg total) by mouth 2 (two) times daily. Can give caps or generic. 01/03/20   Saguier, [name], PA-C  DULoxetine (CYMBALTA) 60 MG capsule Take 1 capsule (60 mg total) by mouth daily. 10/17/19   Cottle, Steva Ready., MD  ELDERBERRY PO Take by mouth.    [provider]  fluticasone (FLOVENT HFA) 110 MCG/ACT inhaler Inhale 2 puffs into the lungs 2 (two) times daily. 11/29/18   Saguier, [name], PA-C  hydrochlorothiazide (MICROZIDE) 12.5 MG capsule Take 1 capsule (12.5 mg total) by mouth daily. 12/11/19   [place], Gwenlyn Found, MD  irbesartan (AVAPRO) 150 MG tablet TAKE 1 TABLET BY MOUTH IN THE MORNING AND ONE HALF TABLET IN THE  EVENING 12/22/19   Zola Button, Yvonne R, DO  methylPREDNISolone (MEDROL) 4 MG tablet 5 tab po day 1, 4  tab po day 2, 3 tab po day 3, 2 tab po day 4, 1 tab po day 5. 01/03/20   Saguier, [name], PA-C  montelukast (SINGULAIR) 10 MG tablet Take 1 tablet (10 mg total) by mouth at bedtime. 01/13/19   Saguier, [name], PA-C  pantoprazole (PROTONIX) 40 MG tablet Take 1 tablet (40 mg total) by mouth 2 (two) times daily. Take Protonix 40 mg BID for 8 weeks,take this medication 30 mins to 1 hour prior to morning and evening meals. Then decrease to once daily until return office appointment. 03/29/18   Cirigliano, Vito V, DO  PREBIOTIC PRODUCT PO Take by mouth. Mix in shake and drink once a day.    [provider]  promethazine (PHENERGAN) 12.5 MG tablet Take 1 tablet (12.5 mg total) by mouth every 8 (eight) hours as needed for nausea or vomiting. 11/07/19   [place]land, Gwenlyn Found, MD   Allergies    Ceftriaxone sodium in dextrose, Guaifenesin & derivatives, Meperidine, Other, Penicillins, Pseudoephedrine, Rosuvastatin calcium, Simvastatin, Ciprofloxacin, and Rocephin [ceftriaxone]  Review of Systems   Review of Systems  Constitutional: Positive for fatigue.  Respiratory: Negative.   Gastrointestinal: Positive for nausea. Negative for abdominal pain, constipation, diarrhea and vomiting.  Genitourinary: Negative for difficulty urinating, dysuria and hematuria.  Neurological: Positive for headaches. Negative for dizziness, speech difficulty and numbness.   Physical Exam Updated Vital Signs BP (!) 132/77 (BP Location: Left Arm)   Pulse 74   Temp 98.6 F (37 C) (Oral)   Resp 18   Ht 5\' 6"  (1.676 m)   Wt (!) 98 kg   SpO2 100%   BMI 34.86 kg/m   Physical Exam Constitutional:      General: She is awake. She is not in acute distress.    Appearance: She is not ill-appearing.  HENT:     Head: Normocephalic and atraumatic.     Mouth/Throat:     Mouth: Mucous membranes are moist.  Eyes:     General:  Lids are normal. No scleral icterus.    Extraocular Movements: Extraocular movements intact.     Conjunctiva/sclera: Conjunctivae normal.     Pupils: Pupils are equal, round, and reactive to light.  Cardiovascular:     Rate and Rhythm: Normal rate and regular rhythm.     Pulses: Normal pulses.     Heart sounds: Normal heart sounds.  Pulmonary:     Effort: Pulmonary effort is normal.     Breath sounds: Normal breath sounds.  Abdominal:     General: Bowel sounds are normal. There is no distension.     Palpations: Abdomen is soft.     Tenderness:  There is no abdominal tenderness.  Musculoskeletal:     Cervical back: Full passive range of motion without pain. No spinous process tenderness.     Right lower leg: No edema.     Left lower leg: No edema.  Skin:    General: Skin is warm and dry.     Capillary Refill: Capillary refill takes less than 2 seconds.  Neurological:     General: No focal deficit present.     Mental Status: She is alert and oriented to person, place, and time.  Psychiatric:        Speech: Speech normal.        Behavior: Behavior is cooperative.    ED Results / Procedures / Treatments   Labs (all labs ordered are listed, but only abnormal results are displayed) Labs Reviewed  CBC - Abnormal; Notable for the following components:      Result Value   Hemoglobin 15.3 (*)    All other components within normal limits  BASIC METABOLIC PANEL  TROPONIN I (HIGH SENSITIVITY)   EKG EKG Interpretation  Date/Time:  Thursday February 01 2020 14:27:25 EDT Ventricular Rate:  76 PR Interval:  148 QRS Duration: 78 QT Interval:  366 QTC Calculation: 411 R Axis:   51 Text Interpretation: Normal sinus rhythm Normal ECG Confirmed by Virgina Norfolk [phone]) on 02/01/2020 3:59:55 PM  Radiology DG Chest 2 View  Result Date: 02/01/2020 CLINICAL DATA:  Chest pain EXAM: CHEST - 2 VIEW COMPARISON:  None. FINDINGS: The heart size and mediastinal contours are within normal limits.  Both lungs are clear. No pleural effusion or pneumothorax. The visualized skeletal structures are unremarkable. IMPRESSION: No active cardiopulmonary disease. Electronically Signed   By: Guadlupe Spanish M.D.   On: 02/01/2020 14:55   Procedures Procedures (including critical care time)  Medications Ordered in ED Medications  acetaminophen (TYLENOL) tablet 1,000 mg (has no administration in time range)    ED Course  I have reviewed the triage vital signs and the nursing notes.  Pertinent labs & imaging results that were available during my care of the patient were reviewed by me and considered in my medical decision making (see chart for details).   MDM Rules/Calculators/A&P  Patient is 54yo female with HTN, anxiety, hypothyroidism who presents to ED for BP concern and HA. On arrival, patient afebrile, HDS. No focal deficits on neurological exam, cardiac exam benign. CBC, BMP unremarkable. ECG normal sinus rhythm. Trop neg. Giving patient ibuprofen for HA relief.   Patient is stable, SBP's in 130's during ED stay. Patient is agreeable for pain control for HA, already has PCP appointment for tomorrow. Discussed with patient to continue follow-up with PCP and discuss BP and thyroid medication adjustment with them. Patient is stable and ready for discharge.                         Final Clinical Impression(s) / ED Diagnoses Final diagnoses:  Hypertension, unspecified type   Rx / DC Orders ED Discharge Orders    None       Evlyn Kanner, MD 02/01/20 1652    Virgina Norfolk, DO 02/01/20 1752

## 2020-02-01 NOTE — ED Notes (Signed)
ED Provider at bedside. 

## 2020-02-01 NOTE — ED Provider Notes (Signed)
I have personally seen and examined the patient. I have reviewed the documentation on PMH/FH/Soc Hx. I have discussed the plan of care with the resident and patient.  I have reviewed and agree with the resident's documentation. Please see associated encounter note.  Briefly, the patient is a 54 y.o. female here with concern about high blood pressure.  Patient with normal vitals.  Blood pressure 132/77.  Neurologically intact.  Has some mild headaches recently that has improved with Tylenol Motrin.  Lab work today shows no significant anemia, electrolyte of the [place], kidney injury.  EKG shows sinus rhythm.  Troponin is normal.  Patient not having any chest pain.  Doubt cardiac or pulmonary process.  Overall no acute emergencies.  Given Tylenol for headache and she already has follow-up with primary care tomorrow.  Understands return precautions.  This chart was dictated using voice recognition software.  Despite best efforts to proofread,  errors can occur which can change the documentation meaning.     EKG Interpretation  Date/Time:  Thursday February 01 2020 14:27:25 EDT Ventricular Rate:  76 PR Interval:  148 QRS Duration: 78 QT Interval:  366 QTC Calculation: 411 R Axis:   51 Text Interpretation: Normal sinus rhythm Normal ECG Confirmed by Virgina Norfolk [phone]) on 02/01/2020 3:59:55 PM         Virgina Norfolk, DO 02/01/20 1646

## 2020-02-02 ENCOUNTER — Ambulatory Visit (INDEPENDENT_AMBULATORY_CARE_PROVIDER_SITE_OTHER): Payer: Self-pay | Admitting: Medical

## 2020-02-02 VITALS — BP 124/67 | HR 80 | Temp 98.0°F | Resp 18 | Ht 66.0 in

## 2020-02-02 DIAGNOSIS — E65 Localized adiposity: Secondary | ICD-10-CM

## 2020-02-02 DIAGNOSIS — I10 Essential (primary) hypertension: Secondary | ICD-10-CM

## 2020-02-02 DIAGNOSIS — E039 Hypothyroidism, unspecified: Secondary | ICD-10-CM

## 2020-02-02 DIAGNOSIS — R5383 Other fatigue: Secondary | ICD-10-CM

## 2020-02-02 NOTE — Progress Notes (Signed)
   Subjective:    Patient ID: Jenna Arroyo, female    DOB: 1966/03/24, 54 y.o.   MRN: [id number]  HPI Pt when left ED 132/77.  Pt states recent bp check at was 162/112 5 pm tuesday. Pt checked various times and was still high.    ED HPI below  Jenna Arroyo is a 54 y.o. female.  Jenna Arroyo is 54yo female with HTN, anxiety, hypothyroidism who presents to ED with HA. Patient reports she went to ED 2 nights ago for elevated SBP in 160's. States she has had constant HA and eye pressure since then. Denies blurry or double vision. Reports CP from wasp stings x2 weeks and intermittent nausea x3 weeks. Denies fevers, SOB, abdominal pain, dysuria, weakness, numbness. She has chronic fatigue but no acute worsening. Says she has PCP appointment tomorrow.   A/P below  Patient is 54yo female with HTN, anxiety, hypothyroidism who presents to ED for BP concern and HA. On arrival, patient afebrile, HDS. No focal deficits on neurological exam, cardiac exam benign. CBC, BMP unremarkable. ECG normal sinus rhythm. Trop neg. Giving patient ibuprofen for HA relief.   Patient is stable, SBP's in 130's during ED stay. Patient is agreeable for pain control for HA, already has PCP appointment for tomorrow. Discussed with patient to continue follow-up with PCP and discuss BP and thyroid medication adjustment with them. Patient is stable and ready for discharge.     Pt had been fatigued for one week.   Pt is on atenolol 25 mg bid.  hctz 12.5 mg daily.  avapro 150 mg am and 1/2 at night.  BP now 120/80. See current ROS.   Pt used tylenol for ha. Yesterday used and helped. No ha resolve.    Pt also notes trying to lose weight. Fatigued and she has concern about cortisol level. Hx of low thyroid. She wants to see endocrinoloigst.Pt want Jenna Arroyo. [phone].     Review of Systems     Objective:   Physical Exam   General Mental Status- Alert. General Appearance- Not in acute distress.    Skin General: Color- Normal Color. Moisture- Normal Moisture.  Neck Carotid Arteries- Normal color. Moisture- Normal Moisture. No carotid bruits. No JVD.  Chest and Lung Exam Auscultation: Breath Sounds:-Normal.  Cardiovascular Auscultation:Rythm- Regular. Murmurs & Other Heart Sounds:Auscultation of the heart reveals- No Murmurs.  Neurologic Cranial Nerve exam:- CN III-XII intact(No nystagmus), symmetric smile. Finger to nose intact. eoms intact. Strength:- 5/5 equal and symmetric strength both upper and lower extremities.     Assessment & Plan:  For htn continue current meds. If you get bp elevation again as before then recommend increasing avapro to 150 mg twice daily. Currently bp is very well controlled. Continue to check bp daily to make sure controlled/less than 140/90.   Give me my chart up date on bp reading in one week.  For fatigue, low thyroid and cortisol conerns, referring you to endocrinologist. If signifcant delay then let me know. In that again could refer to Jenna Arroyo.  Follow up as regularly schedule with pcp or as needed  Esperanza Richters, PA-C   Time spent with patient today was 30  minutes which consisted of chart revdiew, discussing diagnosis, work up treatment and documentation.

## 2020-02-02 NOTE — Patient Instructions (Signed)
For htn continue current meds. If you get bp elevation again as before then recommend increasing avapro to 150 mg twice daily. Currently bp is very well controlled. Continue to check bp daily to make sure controlled/less than 140/90.   Give me my chart up date on bp reading in one week.  For fatigue, low thyroid and cortisol conerns, referring you to endocrinologist. If signifcant delay then let me know. In that again could refer to Dr. Lonzo Cloud.  Follow up as regularly schedule with pcp or as needed

## 2020-02-03 ENCOUNTER — Other Ambulatory Visit: Payer: Self-pay | Admitting: Family Medicine

## 2020-02-05 ENCOUNTER — Telehealth: Payer: Self-pay

## 2020-02-05 MED ORDER — ARMOUR THYROID 15 MG PO TABS: 45.0000 mg | ORAL_TABLET | Freq: Every day | ORAL | 3 refills | Status: DC

## 2020-02-05 NOTE — Telephone Encounter (Signed)
Medication refilled to pharmacy.  

## 2020-02-05 NOTE — Telephone Encounter (Signed)
Nurse Assessment Nurse: Kerry Hough, RN, Alfredia Ferguson Date/Time Jenna Arroyo Time): 02/03/2020 10:05:34 AM Confirm and document reason for call. If symptomatic, describe symptoms. ---Was seen yesterday and for got to get refill on her armour thyroid 15mg , 3x daily. Only has a few pills left, takes it daily. Pt states that she is feeling tired, but is the same as when she was last evaluated. Has the patient had close contact with a person known or suspected to have the novel coronavirus illness OR traveled / lives in area with major community spread (including international travel) in the last 14 days from the onset of symptoms? * If Asymptomatic, screen for exposure and travel within the last 14 days. ---No Does the patient have any new or worsening symptoms? ---No Disp. Time Time) Disposition Final User 02/03/2020 10:16:03 AM Clinical Call Yes 02/05/2020, RN, Kerry Hough Comments User: Alfredia Ferguson, Alfredia Ferguson, RN Date/Time Kerry Hough Time): 02/03/2020 10:15:48 AM Caller states that she has a sufficient amount of the Rx until the office opens, but works early on Mondays and will not have any for Monday. Informed the caller that I would reach out to the on-call provider to see what they would recommend, but the the on-call is not available until 1pm. Advised caller to call back at 1pm. and to call back if any new symptoms. Caller understood.

## 2020-02-05 NOTE — Telephone Encounter (Signed)
Pt called again- see below.   Nurse Assessment Nurse: Alvester Morin RN, Marcelino Duster Date/Time (Eastern Time): 02/03/2020 1:49:28 PM Confirm and document reason for call. If symptomatic, describe symptoms. ---Caller needs a refill on medication, forgot to ask for in office yesterday. No symptoms per caller other than being tired the past few weeks. She feels her counts are low, they have been low prior too, but levels are in range. Provider thinks she may have a lump near the spine that can cause problems and a rise in BP during strange times even on medications. Caller states she declines triage and need medications only at this time. Patient advised due to having doses until Monday (she will be out on Monday), she needed to call the office in regards to this refill. She then advised she had already attempted to get refill from office but provider did not complete this task. Caller frustrated and advised if she does not have this for Monday she will have worsening fatigue and symptoms due to not having her medication. Advised I will call [facility] for loaner or fill for long enough for workday Monday, upon calling [facility], they are closed for lunch. Will attempt again in 5 min when they open, advised caller of this and to expect call back from me after speaking with them. Has the patient had close contact with a person known or suspected to have the novel coronavirus illness OR traveled / lives in area with major community spread (including international travel) in the last 14 days from the onset of symptoms? * If Asymptomatic, screen for exposure and travel within the last 14 days. ---No Does the patient have any new or worsening symptoms? ---No Please document clinical information provided and list any resource used. ---Caller needs a refill on medication, forgot to ask for in office yesterday. No symptoms per caller other than being tired the past few weeks. She feels her counts are low, they  have been low prior PLEASE NOTE: All timestamps contained within this report are represented as [place] Standard Time. CONFIDENTIALTY NOTICE: This fax transmission is intended only for the addressee. It contains information that is legally privileged, confidential or otherwise protected from use or disclosure. If you are not the intended recipient, you are strictly prohibited from reviewing, disclosing, copying using or disseminating any of this information or taking any action in reliance on or regarding this information. If you have received this fax in error, please notify us immediately by telephone so that we can arrange for its return to [place]. Phone: [phone], Toll-Free: [phone], Fax: [phone] Page: 2 of 3 Call Id: [id number] Nurse Assessment too, but levels are in range. Provider thinks she may have a lump near the spine that can cause problems and a rise in BP during strange times even on medications. Caller states she declines triage and need medications only at this time. Patient advised due to having doses until Monday (she will be out on Monday), she needed to call the office in regards to this refill. She then advised she had already attempted to get refill from office but provider did not complete this task. Caller frustrated and advised if she does not have this for Monday she will have worsening fatigue and symptoms due to not having her medication. Advised I will call [facility] for loaner or fill for long enough for workday Monday, upon calling [facility], they are closed for lunch. Will attempt again in 5 min when they open, advised caller of this and to expect call  back from me after speaking with them. Nurse: Alvester Morin RN, Marcelino Duster Date/Time (Eastern Time): 02/03/2020 1:47:01 PM Please select the assessment type ---Refill Additional Documentation ---Takes armourthyroid 15 mg, once daily. Forgot to get refills yesterday while in office. Does the patient have enough  medication to last until the office opens? ---Yes Additional Documentation ---Caller needs a refill on medication, forgot to ask for in office yesterday. No symptoms per caller other than being tired the past few weeks. She feels her counts are low, they have been low prior too, but levels are in range. Provider thinks she may have a lump near the spine that can cause problems and a rise in BP during strange times even on medications. Caller states she declines triage and need medications only at this time. Patient advised due to having doses until Monday (she will be out on Monday), she needed to call the office in regards to this refill. She then advised she had already attempted to get refill from office but provider did not complete this task. Caller frustrated and advised if she does not have this for Monday she will have worsening fatigue and symptoms due to not having her medication. Advised I will call [facility] for loaner or fill for long enough for workday Monday, upon calling [facility], they are closed for lunch. Will attempt again in 5 min when they open, advised caller of this and to expect call back from me after speaking with them. Office sent in medication while on their lunch, script available. Patient made aware to call [facility] to fill. Disp. Time Jenna Arroyo Time) Disposition Final User 02/03/2020 1:57:50 PM Clinical Call Alvester Morin, RN, Marcelino Duster 02/03/2020 2:10:12 PM Pharmacy Call Alvester Morin, RN, Marcelino Duster Reason: Call to [facility] pharmacy. [phone]. Spoke with tech in regards to obtaining loaner dose for Monday. Caller will not have medication for Monday morning dose and has to work early and cannot wait per caller. Advised per [facility], they received script while closed for lunch and patient can call them tomorrow for fill of remaining medication. PLEASE NOTE: All timestamps contained within this report are represented as [place] Standard Time. CONFIDENTIALTY NOTICE: This fax  transmission is intended only for the addressee. It contains information that is legally privileged, confidential or otherwise protected from use or disclosure. If you are not the intended recipient, you are strictly prohibited from reviewing, disclosing, copying using or disseminating any of this information or taking any action in reliance on or regarding this information. If you have received this fax in error, please notify us immediately by telephone so that we can arrange for its return to [place]. Phone: [phone], Toll-Free: [phone], Fax: [phone] Page: 3 of 3 Call Id: [id number] 02/03/2020 2:10:22 PM Clinical Call Yes Alvester Morin, RN, Lavon Paganini Disagree/Comply Comply Caller Understands Yes PreDisposition Call Pharmacis

## 2020-02-06 ENCOUNTER — Telehealth: Payer: Self-pay | Admitting: Family Medicine

## 2020-02-06 NOTE — Telephone Encounter (Signed)
Call her back in regards to referrral

## 2020-02-07 ENCOUNTER — Telehealth: Payer: Self-pay | Admitting: Family Medicine

## 2020-02-07 ENCOUNTER — Telehealth: Payer: Self-pay

## 2020-02-07 NOTE — Telephone Encounter (Signed)
Patient states seen be [name], Patient states still nauseous  From a few week ago.  Please Advise

## 2020-02-07 NOTE — Telephone Encounter (Signed)
Lab letter created for referral .

## 2020-02-08 ENCOUNTER — Telehealth: Payer: Self-pay

## 2020-02-08 MED ORDER — PROMETHAZINE HCL 12.5 MG PO TABS: 12.5000 mg | ORAL_TABLET | Freq: Three times a day (TID) | ORAL | 0 refills | Status: DC | PRN

## 2020-02-08 NOTE — Telephone Encounter (Signed)
Nurse Assessment Nurse: Violeta Gelinas, RN, Regulatory affairs officer (Eastern Time): 02/07/2020 6:48:56 PM Confirm and document reason for call. If symptomatic, describe symptoms. ---feeling nauseous, weak and sore all over her body. was seen by dr in last two weeks. states worsening symptom is the neck pain. was tested for covid and negative. Has the patient had close contact with a person known or suspected to have the novel coronavirus illness OR traveled / lives in area with major community spread (including international travel) in the last 14 days from the onset of symptoms? * If Asymptomatic, screen for exposure and travel within the last 14 days. ---No Does the patient have any new or worsening symptoms? ---Yes Will a triage be completed? ---Yes Related visit to physician within the last 2 weeks? ---Yes Does the PT have any chronic conditions? (i.e. diabetes, asthma, this includes High risk factors for pregnancy, etc.) ---Yes List chronic conditions. ---chronic neurological lyme disease, hashimoto's, epstein bar virus Is the patient pregnant or possibly pregnant? (Ask all females between the ages of 42-55) ---No Is this a behavioral health or substance abuse call? ---No Nurse: Violeta Gelinas, RN, Regulatory affairs officer (Eastern Time): 02/07/2020 7:08:18 PM Please select the assessment type ---Standing order Other current medications? ---Yes List current medications. ---tylenol and ibuprofen, armothyroid, atenolol, cymbalta Medication allergies? ---Yes PLEASE NOTE: All timestamps contained within this report are represented as [place] Standard Time. CONFIDENTIALTY NOTICE: This fax transmission is intended only for the addressee. It contains information that is legally privileged, confidential or otherwise protected from use or disclosure. If you are not the intended recipient, you are strictly prohibited from reviewing, disclosing, copying using or disseminating any of this information or taking any action in  reliance on or regarding this information. If you have received this fax in error, please notify us immediately by telephone so that we can arrange for its return to [place]. Phone: [phone], Toll-Free: [phone], Fax: [phone] Page: 2 of 3 Call Id: [id number] Nurse Assessment List medication allergies. ---PCN, sulfa drugs, bad reaction to pseudofed and congestion relief medicine Pharmacy name and phone number. ---[facility] [phone] Guidelines Guideline Title Affirmed Question Affirmed Notes Nurse Date/Time Jenna Arroyo Time) Neck Pain or Stiffness Neck pain present > 2 weeks Violeta Gelinas, RN, [facility] 02/07/2020 6:53:54 PM Disp. Time Jenna Arroyo Time) Disposition Final User 02/07/2020 7:18:44 PM Pharmacy Call Violeta Gelinas, RN, Jenna Arroyo Reason: left VM with [facility] at [phone] 02/07/2020 7:05:10 PM SEE PCP WITHIN 3 DAYS Yes Whiteley, RN, Control and instrumentation engineer Understands Yes PreDisposition Did not know what to do Care Advice Given Per Guideline SEE PCP WITHIN 3 DAYS: * You need to be seen within 2 or 3 days. PAIN MEDICINES: * For pain relief, you can take either acetaminophen, ibuprofen, or naproxen. * ACETAMINOPHEN - REGULAR STRENGTH TYLENOL: Take 650 mg (two 325 mg pills) by mouth every 4 to 6 hours as needed. Each Regular Strength Tylenol pill has 325 mg of acetaminophen. The most you should take each day is 3,250 mg (10 pills a day). * IBUPROFEN (E.G., MOTRIN, ADVIL): Take 400 mg (two 200 mg pills) by mouth every 6 hours. The most you should take each day is 1,200 mg (six 200 mg pills), unless your doctor has told you to take more. SLEEP: * Sleep on the back or side, not the abdomen. * Sleep with a neck collar. Use a foam neck collar (from a pharmacy) OR a small towel wrapped around the neck. (Reason: keep the head from moving too much during sleep.) *  Avoid triggers that overstress the neck such as working with the neck turned or bent backward, carrying heavy  objects on the head, carrying heavy objects with one arm (instead of both arms), standing on the head, contact sports or even friendly wrestling. AVOID: CALL BACK IF: * You become worse. CARE ADVICE given per Neck Pain (Adult) guideline. Standing Orders Preparation Additional Instructions Route Frequency Duration Nurse Comments User Name Phenergan 25 mg 1 tablet # 6 Oral Every 4 Hours no refills. Violeta Gelinas, RN, Hospital doctor Comments User: Jenna Simas, RN Date/Time Jenna Arroyo Time): 02/07/2020 7:16:30 PM PCP is Jenna Arroyo

## 2020-02-08 NOTE — Telephone Encounter (Signed)
Spoke with patient today , and offered her an OV tomorrow 8/6 and she was not able to come in due to work , she is worried about her cortisol, and states that there is some confusion going on within the office that she doesn't like because it normally doesn't take long for her issues to be resolved , patient is aware of the medication that was sent in today. & she will call Endo ( Dr.Doerr) to try to set up an appointment and I  also mentioned to the patient that she can go to the ED or UC or call tomorrow to see if anyone cancelled their appointment, and she stated the ED is not a  option for her due to the wait time, and she may call tomorrow to see if our office has any openings after 2:00 pm.

## 2020-02-08 NOTE — Telephone Encounter (Signed)
I saw her for but don't remember her having prominent nausea? But ED visit noted nausea. I sent in phenergan to her pharmacy. Regarding persisting symptoms she should follow up with pcp. I also referred pt to endocrinologist at her request. That appointment is still pending. Please help her get in with pcp.  If symptoms worsen before seeing pcp or endocrinologist then UC or ED.

## 2020-02-09 ENCOUNTER — Ambulatory Visit: Payer: Self-pay | Admitting: Medical

## 2020-02-09 ENCOUNTER — Other Ambulatory Visit: Payer: Self-pay

## 2020-02-09 DIAGNOSIS — I10 Essential (primary) hypertension: Secondary | ICD-10-CM

## 2020-02-09 MED ORDER — ATENOLOL 25 MG PO TABS: 25.0000 mg | ORAL_TABLET | Freq: Two times a day (BID) | ORAL | 2 refills | Status: DC

## 2020-02-13 ENCOUNTER — Telehealth: Payer: Self-pay | Admitting: Family Medicine

## 2020-02-13 DIAGNOSIS — N6320 Unspecified lump in the left breast, unspecified quadrant: Secondary | ICD-10-CM

## 2020-02-13 NOTE — Telephone Encounter (Signed)
Caller: Tyrena Call back # [phone]  Patient states she needs a diagnostic mammogram was told in the ED there was lump on left breast.

## 2020-02-13 NOTE — Telephone Encounter (Signed)
done

## 2020-02-13 NOTE — Telephone Encounter (Signed)
Pended mammogram.

## 2020-02-14 ENCOUNTER — Other Ambulatory Visit: Payer: Self-pay | Admitting: Psychiatry

## 2020-02-14 ENCOUNTER — Other Ambulatory Visit: Payer: Self-pay | Admitting: Family Medicine

## 2020-02-14 DIAGNOSIS — F411 Generalized anxiety disorder: Secondary | ICD-10-CM

## 2020-02-14 DIAGNOSIS — F900 Attention-deficit hyperactivity disorder, predominantly inattentive type: Secondary | ICD-10-CM

## 2020-02-14 DIAGNOSIS — Z8659 Personal history of other mental and behavioral disorders: Secondary | ICD-10-CM

## 2020-02-14 DIAGNOSIS — N6320 Unspecified lump in the left breast, unspecified quadrant: Secondary | ICD-10-CM

## 2020-02-15 ENCOUNTER — Other Ambulatory Visit: Payer: Self-pay

## 2020-02-15 NOTE — Telephone Encounter (Signed)
Has apt 08/18, okay to send refill?

## 2020-02-16 ENCOUNTER — Other Ambulatory Visit: Payer: Self-pay

## 2020-02-16 DIAGNOSIS — N644 Mastodynia: Secondary | ICD-10-CM

## 2020-02-16 DIAGNOSIS — R2232 Localized swelling, mass and lump, left upper limb: Secondary | ICD-10-CM

## 2020-02-16 NOTE — Telephone Encounter (Signed)
CallerTamaiya Arroyo Call Back # [phone]  Patient states that she needs a referral to the [facility]. Patient notified that a referral was placed however, patient states per [facility], the referral needs more detail of than the word diagnostic. Patient states she has knots (bumps) under left arm.   Please Advise

## 2020-02-16 NOTE — Telephone Encounter (Signed)
Called the [facility]- they did not actually need anything from me, orders are all set.  They have referred pt to scholarship program as she is uninsured

## 2020-02-18 ENCOUNTER — Other Ambulatory Visit: Payer: Self-pay | Admitting: Family Medicine

## 2020-02-18 DIAGNOSIS — I10 Essential (primary) hypertension: Secondary | ICD-10-CM

## 2020-02-20 ENCOUNTER — Telehealth: Payer: Self-pay

## 2020-02-20 ENCOUNTER — Ambulatory Visit: Payer: Self-pay

## 2020-02-20 NOTE — Telephone Encounter (Signed)
Left message with patient about rescheduling [facility] appointment. Left name and number for patient to call back.

## 2020-02-21 ENCOUNTER — Ambulatory Visit (INDEPENDENT_AMBULATORY_CARE_PROVIDER_SITE_OTHER): Payer: Self-pay | Admitting: Psychiatry

## 2020-02-21 ENCOUNTER — Other Ambulatory Visit: Payer: Self-pay

## 2020-02-21 ENCOUNTER — Encounter: Payer: Self-pay | Admitting: Psychiatry

## 2020-02-21 DIAGNOSIS — F900 Attention-deficit hyperactivity disorder, predominantly inattentive type: Secondary | ICD-10-CM

## 2020-02-21 DIAGNOSIS — Z8659 Personal history of other mental and behavioral disorders: Secondary | ICD-10-CM

## 2020-02-21 DIAGNOSIS — F411 Generalized anxiety disorder: Secondary | ICD-10-CM

## 2020-02-21 MED ORDER — ALPRAZOLAM 0.5 MG PO TABS: ORAL_TABLET | ORAL | 5 refills | Status: DC

## 2020-02-21 MED ORDER — DULOXETINE HCL 60 MG PO CPEP: 60.0000 mg | ORAL_CAPSULE | Freq: Every day | ORAL | 1 refills | Status: DC

## 2020-02-21 NOTE — Progress Notes (Signed)
Jenna GessLisa M Arroyo [id number] 06/11/1966 54 y.o.  Virtual Visit via Telephone Note  I connected with pt by telephone and verified that I am speaking with the correct person using two identifiers.   I discussed the limitations, risks, security and privacy concerns of performing an evaluation and management service by telephone and the availability of in person appointments. I also discussed with the patient that there may be a patient responsible charge related to this service. The patient expressed understanding and agreed to proceed.  I discussed the assessment and treatment plan with the patient. The patient was provided an opportunity to ask questions and all were answered. The patient agreed with the plan and demonstrated an understanding of the instructions.   The patient was advised to call back or seek an in-person evaluation if the symptoms worsen or if the condition fails to improve as anticipated.  I provided 15 minutes of non-face-to-face time during this encounter. The call started at 1 and ended at 115. The patient was located at home and the provider was located office.   Subjective:   Patient ID:  Jenna GessLisa M Arroyo is a 54 y.o. (DOB 11/23/1965) female.  Chief Complaint:  Chief Complaint  Patient presents with  . Follow-up  . Anxiety  . Depression    Anxiety Symptoms include nervous/anxious behavior. Patient reports no confusion, decreased concentration or suicidal ideas.     Jenna Arroyo presents to the office today for follow-up of anxiety and mood.  Last seen October 2020.  She had been having cognitive complaints as previously noted. Still recommend trial switch to Ativan 1 mg QID to see if cognition is better.  Her cognitive problems have frequently been in a contributor to her difficulty maintaining employment.  She is fearful about having Xanax withdrawal.  This was discussed in detail.  Also discussed in detail the potential sedative effects of benzodiazepines and  especially in the first week of the transition from 1 of the medications to another.  It is likely that she will be more successful with the transition if she is a little more sleepy than normal the first week by using half of each medication then if we abruptly switch medications.  That has a much lower success rate in her case because of her worry about the transition. She agrees to a trial.  Will do 1/2 of 0.5 mg tablet tablet of Xanax and 1/2 of 1 mg tablet Ativan QID for 1 week. Then DC alprazolam and take Ativan 1 mg 3 times daily or 4 times daily to control anxiety.  Call back in 2 to 3 weeks to give a report of how well she is doing with the new medication. DC hydroxyzine because of its potential to cause cognitive problems. Continue duloxetine 60 mg daily.  10/18/19 appt with the following noted and no further med changes: She had difficulty implementing the transition and then complained of insomnia with lorazepam and was switched back to alprazolam after a week.  Also CO pain and nausea.   Overall doing OK except son and D upset with her since December.  Disc this in detail.  Hasn't seen GD's since then.  Working at job she loves but very PT. Exercising and lost 18#  02/21/20 with the following noted: Hanging in there with stress.  Not allowed to see GD still.  Doesn't know why. No Covid.  OCC BP spikes. No major med changes. Satisfied with duloxetine and Xanax.  No full panic.   Sleep  waxes and wanes but lately good.  Working same job as in April and likes it.  Patient reports stable mood and denies depressed or irritable moods.  Patient denies any recent difficulty with anxiety.  sleep affected by smokers around her apt.  Denies appetite disturbance.  Patient reports that energy and motivation have been good.  Patient denies any difficulty with concentration.  Patient denies any suicidal ideation. No panic.  Anxiety manageable.  Using Xanax mostly TID.  On duloxetine.  Sleep better  in the day.    No questions concerns re: meds.  Past Psychiatric Medication Trials: Duloxetine 60, imipramine, desipramine, sertraline hyper, citalopram side effects, paroxetine chest pain, Nuvigil edgy, buspirone dizzy, Xanax, Ambien anxiety, trazodone side effects, atenolol  Review of Systems:  Review of Systems  Constitutional: Positive for fatigue.       Low exercise tolerance  Gastrointestinal: Negative for vomiting.  Musculoskeletal: Positive for arthralgias and neck pain.  Neurological: Positive for headaches. Negative for tremors and weakness.       Foggy headed.  Psychiatric/Behavioral: Positive for sleep disturbance. Negative for agitation, behavioral problems, confusion, decreased concentration, dysphoric mood, hallucinations, self-injury and suicidal ideas. The patient is nervous/anxious. The patient is not hyperactive.     Medications: I have reviewed the patient's current medications.  Current Outpatient Medications  Medication Sig Dispense Refill  . albuterol (PROVENTIL HFA;VENTOLIN HFA) 108 (90 Base) MCG/ACT inhaler Inhale 1-2 puffs into the lungs every 6 (six) hours as needed. 1 Inhaler 0  . ARMOUR THYROID 15 MG tablet Take 3 tablets (45 mg total) by mouth daily. 90 tablet 3  . atenolol (TENORMIN) 25 MG tablet Take 1 tablet (25 mg total) by mouth 2 (two) times daily. 180 tablet 2  . azelastine (ASTELIN) 0.1 % nasal spray Place 2 sprays into both nostrils 2 (two) times daily. Use in each nostril as directed 30 mL 1  . Charcoal 200 MG CAPS Take by mouth every other day.    . charcoal activated, NO SORBITOL, (ACTIDOSE-AQUA) suspension Take 50 g by mouth once. 200 mg    . Cholecalciferol (VITAMIN D) 10 MCG/ML LIQD Take by mouth.    . cholestyramine (QUESTRAN) 4 GM/DOSE powder BEGIN WITH 1/4 TSP QOD. FOLLOW PROTOCOL  1  . clotrimazole (LOTRIMIN) 1 % cream Apply to affected area 2 times daily for 2 weeks 15 g 0  . doxycycline (VIBRAMYCIN) 100 MG capsule Take 1 capsule (100 mg  total) by mouth 2 (two) times daily. Can give caps or generic. 20 capsule 0  . ELDERBERRY PO Take by mouth.    . fluticasone (FLOVENT HFA) 110 MCG/ACT inhaler Inhale 2 puffs into the lungs 2 (two) times daily. 1 Inhaler 12  . hydrochlorothiazide (MICROZIDE) 12.5 MG capsule Take 1 capsule (12.5 mg total) by mouth daily. 30 capsule 3  . irbesartan (AVAPRO) 150 MG tablet TAKE 1 TABLET BY MOUTH IN THE MORNING  AND1/2 (ONE-HALF) TABLET IN THE EVENING 90 tablet 0  . methylPREDNISolone (MEDROL) 4 MG tablet 5 tab po day 1, 4  tab po day 2, 3 tab po day 3, 2 tab po day 4, 1 tab po day 5. 15 tablet 0  . montelukast (SINGULAIR) 10 MG tablet Take 1 tablet (10 mg total) by mouth at bedtime. 30 tablet 0  . pantoprazole (PROTONIX) 40 MG tablet Take 1 tablet (40 mg total) by mouth 2 (two) times daily. Take Protonix 40 mg BID for 8 weeks,take this medication 30 mins to 1 hour prior to morning  and evening meals. Then decrease to once daily until return office appointment. 150 tablet 0  . PREBIOTIC PRODUCT PO Take by mouth. Mix in shake and drink once a day.    . promethazine (PHENERGAN) 12.5 MG tablet Take 1 tablet (12.5 mg total) by mouth every 8 (eight) hours as needed for nausea or vomiting. 12 tablet 0  . ALPRAZolam (XANAX) 0.5 MG tablet TAKE ONE TABLET BY MOUTH FOUR TIMES A DAY IN THE MONRING, AT NOON, IN THE EVENING, AND AT BEDTIME 120 tablet 5  . DULoxetine (CYMBALTA) 60 MG capsule Take 1 capsule (60 mg total) by mouth daily. 90 capsule 1   No current facility-administered medications for this visit.    Medication Side Effects: None  Allergies:  Allergies  Allergen Reactions  . Ceftriaxone Sodium In Dextrose Other (See Comments)    "heart races and cannot breathe"  . Guaifenesin & Derivatives Other (See Comments)    "heart races, dizziness, light-headedness"  . Meperidine Other (See Comments)    "heart races"  . Other Rash  . Penicillins Hives  . Pseudoephedrine Other (See Comments)    "heart  races and high blood pressure"  . Rosuvastatin Calcium Nausea And Vomiting    Muscle aches   . Simvastatin Other (See Comments)    muscle aching   . Ciprofloxacin   . Rocephin [Ceftriaxone] Itching    Past Medical History:  Diagnosis Date  . Anxiety   . High cholesterol   . Hypertension   . Lyme disease   . Thyroid disease     Family History  Problem Relation Age of Onset  . Hypertension Mother   . Heart disease Father   . Hypertension Sister   . Hypertension Brother   . Colon cancer Neg Hx     Social History   Socioeconomic History  . Marital status: Divorced    Spouse name: Not on file  . Number of children: Not on file  . Years of education: Not on file  . Highest education level: Not on file  Occupational History  . Not on file  Tobacco Use  . Smoking status: Never Smoker  . Smokeless tobacco: Never Used  Vaping Use  . Vaping Use: Never used  Substance and Sexual Activity  . Alcohol use: No  . Drug use: No  . Sexual activity: Not on file  Other Topics Concern  . Not on file  Social History Narrative  . Not on file   Social Determinants of Health   Financial Resource Strain:   . Difficulty of Paying Living Expenses:   Food Insecurity:   . Worried About Programme researcher, broadcasting/film/video in the Last Year:   . Barista in the Last Year:   Transportation Needs:   . Freight forwarder (Medical):   Marland Kitchen Lack of Transportation (Non-Medical):   Physical Activity:   . Days of Exercise per Week:   . Minutes of Exercise per Session:   Stress:   . Feeling of Stress :   Social Connections:   . Frequency of Communication with Friends and Family:   . Frequency of Social Gatherings with Friends and Family:   . Attends Religious Services:   . Active Member of Clubs or Organizations:   . Attends Banker Meetings:   Marland Kitchen Marital Status:   Intimate Partner Violence:   . Fear of Current or Ex-Partner:   . Emotionally Abused:   Marland Kitchen Physically Abused:   .  Sexually Abused:  Past Medical History, Surgical history, Social history, and Family history were reviewed and updated as appropriate.   Please see review of systems for further details on the patient's review from today.   Objective:   Physical Exam:  There were no vitals taken for this visit.  Physical Exam Neurological:     Mental Status: She is alert and oriented to person, place, and time.     Cranial Nerves: No dysarthria.  Psychiatric:        Attention and Perception: She is inattentive.        Mood and Affect: Mood is anxious. Affect is not tearful.        Speech: Speech normal.        Behavior: Behavior is cooperative.        Thought Content: Thought content normal. Thought content is not paranoid or delusional. Thought content does not include homicidal or suicidal ideation. Thought content does not include homicidal or suicidal plan.        Cognition and Memory: Cognition normal.        Judgment: Judgment normal.     Comments: Insight fair.  Patient has chronically low stress tolerance. Complain of chronic forgetfulness     Lab Review:     Component Value Date/Time   NA 137 02/01/2020 1513   K 4.4 02/01/2020 1513   CL 98 02/01/2020 1513   CO2 28 02/01/2020 1513   GLUCOSE 90 02/01/2020 1513   BUN 12 02/01/2020 1513   CREATININE 0.82 02/01/2020 1513   CREATININE 0.90 12/11/2016 1613   CALCIUM 9.5 02/01/2020 1513   PROT 6.5 12/11/2019 1428   ALBUMIN 4.1 12/11/2019 1428   AST 13 12/11/2019 1428   ALT 17 12/11/2019 1428   ALKPHOS 99 12/11/2019 1428   BILITOT 0.4 12/11/2019 1428   GFRNONAA >60 02/01/2020 1513   GFRAA >60 02/01/2020 1513       Component Value Date/Time   WBC 9.1 02/01/2020 1513   RBC 5.07 02/01/2020 1513   HGB 15.3 (H) 02/01/2020 1513   HCT 45.7 02/01/2020 1513   PLT 338 02/01/2020 1513   MCV 90.1 02/01/2020 1513   MCH 30.2 02/01/2020 1513   MCHC 33.5 02/01/2020 1513   RDW 13.1 02/01/2020 1513   LYMPHSABS 1.9 03/02/2017 1301    MONOABS 0.4 03/02/2017 1301   EOSABS 0.3 03/02/2017 1301   BASOSABS 0.1 03/02/2017 1301    No results found for: POCLITH, LITHIUM   No results found for: PHENYTOIN, PHENOBARB, VALPROATE, CBMZ   .res Assessment: Plan:   Generalized anxiety disorder, manageable  Attention deficit hyperactivity disorder improved with duloxetine  History of panic attacks in remission with current meds  Overall patient is satisfied with current meds and benefit.  She failed attempted transition from alprazolam to lorazepam in October 2020.  It appears she had some withdrawal symptoms and she is gone back to alprazolam.  It was discussed with her that we could do the transition more gradually if she wanted to pursue that to see if she can get cognitive benefit with the switch.  She does not want to do that at this time.  We discussed the short-term risks associated with benzodiazepines including sedation and increased fall risk among others.  Discussed long-term side effect risk including dependence, potential withdrawal symptoms, and the potential eventual dose-related risk of dementia.  But recent studies from 2020 dispute this association between benzodiazepines and dementia risk. Newer studies in 2020 do not support an association with dementia.  FU  6 mos  Meredith Staggers, MD, DFAPA   Please see After Visit Summary for patient specific instructions.  No future appointments.  No orders of the defined types were placed in this encounter.     -------------------------------

## 2020-02-26 ENCOUNTER — Telehealth: Payer: Self-pay | Admitting: Family Medicine

## 2020-02-26 NOTE — Telephone Encounter (Signed)
Caller: Tessah Call back # [phone]  Patient states her blood presurre is still high at times 02/25/20 138/108, 140/98, 139/100

## 2020-02-27 ENCOUNTER — Other Ambulatory Visit: Payer: Self-pay

## 2020-02-27 NOTE — Telephone Encounter (Signed)
Ok- I looks like she saw [name] with c/o elevated BP last month At that time her BP was actually ok, but he recommended increase her irbesartan to 150 BID (she was taking 150 am and 75 pm at the time. Has she already made this increase?  I can see her in the office this week or next if needed   Please give her a call, Ty JC

## 2020-02-28 ENCOUNTER — Other Ambulatory Visit: Payer: Self-pay

## 2020-02-28 DIAGNOSIS — I10 Essential (primary) hypertension: Secondary | ICD-10-CM

## 2020-02-28 MED ORDER — IRBESARTAN 150 MG PO TABS: 150.0000 mg | ORAL_TABLET | Freq: Two times a day (BID) | ORAL | 1 refills | Status: DC

## 2020-02-28 NOTE — Telephone Encounter (Signed)
Patient states she did not remember [name] saying that however she is willing to start it BID. I have sent new rx for her. She states she will make an appointment to follow up soon.

## 2020-03-07 ENCOUNTER — Telehealth: Payer: Self-pay | Admitting: Family Medicine

## 2020-03-08 ENCOUNTER — Telehealth: Payer: Self-pay

## 2020-03-08 NOTE — Telephone Encounter (Addendum)
117/94 Pulse: 87 135/96 pulse: 80   Patient called to share BP readings , taking meds BID & she also stated she is not taking any cholesterol medication but was wondering if she can take  Cholestyramine  .   I don't prescribe cholestyramine for high cholesterol. I usually prescribe zetia or statin. I don't see any recent lipid panel. Last one done 2019. Would you pass this question to pt pcp.  Pcp would problably want her to get repeat cmp and lipid panel as I don't see recent lipid panel since 2019.  BP readings are goo.

## 2020-03-09 ENCOUNTER — Encounter (HOSPITAL_BASED_OUTPATIENT_CLINIC_OR_DEPARTMENT_OTHER): Payer: Self-pay | Admitting: Emergency Medicine

## 2020-03-09 ENCOUNTER — Other Ambulatory Visit: Payer: Self-pay

## 2020-03-09 DIAGNOSIS — Z5321 Procedure and treatment not carried out due to patient leaving prior to being seen by health care provider: Secondary | ICD-10-CM | POA: Insufficient documentation

## 2020-03-09 DIAGNOSIS — I10 Essential (primary) hypertension: Secondary | ICD-10-CM | POA: Insufficient documentation

## 2020-03-09 NOTE — ED Triage Notes (Addendum)
Pt reports hx of HTN with "BP spiking over the last 4 days", readings at home 147/110 and 140/104. States she called her PCP and did not receive a f/u. Pt takes home medication as prescribed (Irbesartan). Pt endorses HA but no other sx.

## 2020-03-10 ENCOUNTER — Emergency Department (HOSPITAL_BASED_OUTPATIENT_CLINIC_OR_DEPARTMENT_OTHER)
Admission: EM | Admit: 2020-03-10 | Discharge: 2020-03-10 | Disposition: A | Discharge status: Home / Self Care | Payer: Self-pay | Attending: Emergency Medicine | Admitting: Emergency Medicine

## 2020-03-11 ENCOUNTER — Telehealth: Payer: Self-pay | Admitting: Psychiatry

## 2020-03-11 DIAGNOSIS — Z8659 Personal history of other mental and behavioral disorders: Secondary | ICD-10-CM

## 2020-03-11 DIAGNOSIS — F411 Generalized anxiety disorder: Secondary | ICD-10-CM

## 2020-03-11 DIAGNOSIS — F900 Attention-deficit hyperactivity disorder, predominantly inattentive type: Secondary | ICD-10-CM

## 2020-03-11 MED ORDER — DULOXETINE HCL 20 MG PO CPEP: 40.0000 mg | ORAL_CAPSULE | Freq: Every day | ORAL | 0 refills | Status: DC

## 2020-03-11 NOTE — Telephone Encounter (Signed)
Received after hours call from pt who reports that she has been having "spikes" in BP that have been gradually worsening  for months and have persisted after PCP increasing dose of irebesartan. She reports that she spoke with her pharmacist and she is concerned that Cymbalta could be causing increased BP. She reports that she did not take Cymbalta today and her BP has been stable. She reports that she has had discontinuation s/s with missed doses of Cymbalta in the past. Recommended gradually reducing Cymbalta to avoid discontinuation s/s and to determine if possible side effects (elevated BP) may be dose related and to determine highest tolerated dose, ie discussed that 40 mg dose may continue to be effective for target s/s and have minimal side effects. Recommended trial of Cymbalta 40 mg po qd for one week and then following up with Dr. Jennelle Human to discuss response and plan. Will send script for Cymbalta 20 mg two capsules daily. Discussed that she could take Cymbalta 20 mg BID if she experiences some discontinuation s/s. Pt is in agreement with this plan.

## 2020-03-12 ENCOUNTER — Telehealth: Payer: Self-pay

## 2020-03-12 NOTE — Telephone Encounter (Signed)
Patient states she thinks Cymbalta was the issue causing her BP to increase. She called on call doctor that advised her to go 20 mg bid once in morning and evening. She noticed it did not lower diastolic and states she does continue to have a headache. Went to ER over weekend but waited several hours with no one that could see her so she left. Advised her to go to ER or UC if symptoms worsen. Offered her an appointment to see you, but she states she wants to give cymbalta change a chance to work. Please advise if she should see you? I will call her back if so.

## 2020-03-12 NOTE — Telephone Encounter (Signed)
Noted. Agree with plan.

## 2020-03-12 NOTE — Telephone Encounter (Signed)
See previous encounter

## 2020-03-12 NOTE — Telephone Encounter (Signed)
Nurse Assessment Nurse: Self, RN, Herbert Seta Date/Time (Eastern Time): 03/09/2020 11:56:09 AM Confirm and document reason for call. If symptomatic, describe symptoms. ---Caller says BP is 131/ 90 and 140/94 with her increase in meds. Caller denied CP, dizziness, , HA and nausea. Has the patient had close contact with a person known or suspected to have the novel coronavirus illness OR traveled / lives in area with major community spread (including international travel) in the last 14 days from the onset of symptoms? * If Asymptomatic, screen for exposure and travel within the last 14 days. ---No Does the patient have any new or worsening symptoms? ---Yes Will a triage be completed? ---Yes Related visit to physician within the last 2 weeks? ---Yes Does the PT have any chronic conditions? (i.e. diabetes, asthma, this includes High risk factors for pregnancy, etc.) ---Yes List chronic conditions. ---Limes disease , HTN Is the patient pregnant or possibly pregnant? (Ask all females between the ages of 54-55) ---No Is this a behavioral health or substance abuse call? ---No Guidelines Guideline Title Affirmed Question Affirmed Notes Nurse Date/Time (Eastern Time) Blood Pressure - High [1] Systolic BP >= 130 OR Diastolic >= 80 AND [2] taking BP medications Self, RN, Herbert Seta 03/09/2020 11:58:24 AM PLEASE NOTE: All timestamps contained within this report are represented as [place] Standard Time. CONFIDENTIALTY NOTICE: This fax transmission is intended only for the addressee. It contains information that is legally privileged, confidential or otherwise protected from use or disclosure. If you are not the intended recipient, you are strictly prohibited from reviewing, disclosing, copying using or disseminating any of this information or taking any action in reliance on or regarding this information. If you have received this fax in error, please notify us immediately by telephone so that we can arrange  for its return to [place]. Phone: [phone], Toll-Free: [phone], Fax: [phone] Page: 2 of 2 Call Id: [id number] Disp. Time [name] Disposition Final User 03/09/2020 12:01:44 PM See PCP within 2 Weeks Yes Self, RN, Sibyl Parr Disagree/Comply Comply Caller Understands Yes PreDisposition Call Doctor Care Advice Given Per Guideline SEE PCP WITHIN 2 WEEKS: * You need to be seen for this ongoing problem within the next 2 weeks. * The goal of blood pressure treatment for most people with hypertension is to keep the blood pressure under 140/90. For people that are 60 years or older, your doctor may instead want to keep the blood pressure under 150/90. LIFESTYLE MODIFICATIONS: * The following things can help you reduce your blood pressure. * DECREASE SODIUM INTAKE: Aim to eat less than 2.4 g (100 mmol) of sodium each day. Unfortunately 75% of the salt in the average person's diet is in pre-processed foods. CALL BACK IF: * Weakness or numbness of the face, arm or leg on one side of the body occurs * Difficulty walking, difficulty talking, or severe headache occurs * Chest pain or difficulty breathing occurs * Your blood pressure is over 160/100 * You become worse. CARE ADVICE given per High Blood Pressure (Adult) guideline.

## 2020-03-13 NOTE — Telephone Encounter (Signed)
Patient has been scheduled to see PCP

## 2020-03-15 ENCOUNTER — Other Ambulatory Visit: Payer: Self-pay | Admitting: Psychiatry

## 2020-03-15 DIAGNOSIS — Z8659 Personal history of other mental and behavioral disorders: Secondary | ICD-10-CM

## 2020-03-15 DIAGNOSIS — F411 Generalized anxiety disorder: Secondary | ICD-10-CM

## 2020-03-16 NOTE — Progress Notes (Signed)
[facility] [address], [address] [place], [place] [address] [phone]  Date:  03/18/2020   Name:  Jenna Arroyo   DOB:  02/28/1966   MRN:  [id number]  PCP:  Pearline Cables, MD    Chief Complaint: Hypertension (high readings at home, headache, extreme fatigue, ongoing concern, lowered cymbalta dose)   History of Present Illness:  Jenna Arroyo is a 54 y.o. very pleasant female patient who presents with the following:  History of hypertension, hypothyroidism, dyslipidemia, anxiety Last seen by myself in June where she was concerned about LE edema- we gave her some HCTZ to use as needed Pt here today with concern of blood pressure being elevated She called and spoke to nurse triage about 1 week ago with concern of blood pressure running 131-140/90-94  She thought this might be due to her cymbalta She spoke with Dr Alwyn Ren nurse and they had her decrease her dose to 20 mg; she has been back on 20 mg for 5 days.  She has felt tired- however she does have "a lot going on" and is battling her depression sx  Her BP at home has been better as far as the systolic, but the diastolic is still borderline  However overall improved since she decreased her cymbata dose Headaches are  covid series- she has not done this as of yet, states concerns about safety.  I did my best to reassure her and encouraged her to get this vaccine series to help prevent COVID-19 illness and death mammo- also delayed, apparently she has to get this done with approval of her disability lawyer?   Current blood pressure medications are Atenolol 25 BID irbesartan 150 BID   She has hydrochlorothiazide on her list, but has not used in some time  Her BP has also improved since she went down on her cymbalta   Patient Active Problem List   Diagnosis Date Noted  . History of panic attacks 01/23/2019  . GAD (generalized anxiety disorder) 04/22/2018  . ADD  (attention deficit disorder) 04/22/2018  . Axillary lymphadenopathy 12/11/2016  . Lyme disease 12/11/2016  . Dyslipidemia 06/18/2016  . Essential hypertension, benign 06/18/2016  . Acquired hypothyroidism 06/18/2016    Past Medical History:  Diagnosis Date  . Anxiety   . High cholesterol   . Hypertension   . Lyme disease   . Thyroid disease     Past Surgical History:  Procedure Laterality Date  . ABDOMINAL EXPLORATION SURGERY    . APPENDECTOMY    . MASTOIDECTOMY    . TONSILECTOMY/ADENOIDECTOMY WITH MYRINGOTOMY      Social History   Tobacco Use  . Smoking status: Never Smoker  . Smokeless tobacco: Never Used  Vaping Use  . Vaping Use: Never used  Substance Use Topics  . Alcohol use: No  . Drug use: No    Family History  Problem Relation Age of Onset  . Hypertension Mother   . Heart disease Father   . Hypertension Sister   . Hypertension Brother   . Colon cancer Neg Hx     Allergies  Allergen Reactions  . Ceftriaxone Sodium In Dextrose Other (See Comments)    "heart races and cannot breathe"  . Guaifenesin & Derivatives Other (See Comments)    "heart races, dizziness, light-headedness"  . Meperidine Other (See Comments)    "heart races"  . Other Rash  . Penicillins Hives  . Pseudoephedrine Other (See Comments)    "  heart races and high blood pressure"  . Rosuvastatin Calcium Nausea And Vomiting    Muscle aches   . Simvastatin Other (See Comments)    muscle aching   . Ciprofloxacin   . Rocephin [Ceftriaxone] Itching    Medication list has been reviewed and updated.  Current Outpatient Medications on File Prior to Visit  Medication Sig Dispense Refill  . albuterol (PROVENTIL HFA;VENTOLIN HFA) 108 (90 Base) MCG/ACT inhaler Inhale 1-2 puffs into the lungs every 6 (six) hours as needed. 1 Inhaler 0  . ALPRAZolam (XANAX) 0.5 MG tablet TAKE ONE TABLET BY MOUTH FOUR TIMES A DAY IN THE MONRING, AT NOON, IN THE EVENING, AND AT BEDTIME 120 tablet 5  . ARMOUR  THYROID 15 MG tablet Take 3 tablets (45 mg total) by mouth daily. 90 tablet 3  . atenolol (TENORMIN) 25 MG tablet Take 1 tablet (25 mg total) by mouth 2 (two) times daily. 180 tablet 2  . azelastine (ASTELIN) 0.1 % nasal spray Place 2 sprays into both nostrils 2 (two) times daily. Use in each nostril as directed 30 mL 1  . Charcoal 200 MG CAPS Take by mouth every other day.    . charcoal activated, NO SORBITOL, (ACTIDOSE-AQUA) suspension Take 50 g by mouth once. 200 mg    . Cholecalciferol (VITAMIN D) 10 MCG/ML LIQD Take by mouth.    . cholestyramine (QUESTRAN) 4 GM/DOSE powder BEGIN WITH 1/4 TSP QOD. FOLLOW PROTOCOL  1  . clotrimazole (LOTRIMIN) 1 % cream Apply to affected area 2 times daily for 2 weeks 15 g 0  . doxycycline (VIBRAMYCIN) 100 MG capsule Take 1 capsule (100 mg total) by mouth 2 (two) times daily. Can give caps or generic. 20 capsule 0  . DULoxetine (CYMBALTA) 20 MG capsule Take 2 capsules (40 mg total) by mouth daily. 60 capsule 0  . ELDERBERRY PO Take by mouth.    . fluticasone (FLOVENT HFA) 110 MCG/ACT inhaler Inhale 2 puffs into the lungs 2 (two) times daily. 1 Inhaler 12  . hydrochlorothiazide (MICROZIDE) 12.5 MG capsule Take 1 capsule (12.5 mg total) by mouth daily. 30 capsule 3  . irbesartan (AVAPRO) 150 MG tablet Take 1 tablet (150 mg total) by mouth in the morning and at bedtime. 180 tablet 1  . methylPREDNISolone (MEDROL) 4 MG tablet 5 tab po day 1, 4  tab po day 2, 3 tab po day 3, 2 tab po day 4, 1 tab po day 5. 15 tablet 0  . montelukast (SINGULAIR) 10 MG tablet Take 1 tablet (10 mg total) by mouth at bedtime. 30 tablet 0  . pantoprazole (PROTONIX) 40 MG tablet Take 1 tablet (40 mg total) by mouth 2 (two) times daily. Take Protonix 40 mg BID for 8 weeks,take this medication 30 mins to 1 hour prior to morning and evening meals. Then decrease to once daily until return office appointment. 150 tablet 0  . PREBIOTIC PRODUCT PO Take by mouth. Mix in shake and drink once a day.     . promethazine (PHENERGAN) 12.5 MG tablet Take 1 tablet (12.5 mg total) by mouth every 8 (eight) hours as needed for nausea or vomiting. 12 tablet 0   No current facility-administered medications on file prior to visit.    Review of Systems:  As per HPI- otherwise negative.   Physical Examination: Vitals:   03/18/20 1448  BP: 122/90  Pulse: 80  Resp: 17  SpO2: 99%   Vitals:   03/18/20 1448  Weight: 218 lb (98.9  kg)  Height: 5\' 6"  (1.676 m)   Body mass index is 35.19 kg/m. Ideal Body Weight: Weight in (lb) to have BMI = 25: 154.6  GEN: no acute distress.  Obese, otherwise looks well HEENT: Atraumatic, Normocephalic.  Ears and Nose: No external deformity. CV: RRR, No M/G/R. No JVD. No thrill. No extra heart sounds. PULM: CTA B, no wheezes, crackles, rhonchi. No retractions. No resp. distress. No accessory muscle use. ABD: S, NT, ND, +BS. No rebound. No HSM. EXTR: No c/c/e PSYCH: Normally interactive. Conversant.    Assessment and Plan: Essential hypertension  Encounter for screening mammogram for malignant neoplasm of breast  Patient today to discuss her blood pressure.  She had recently increased her Cymbalta dose to 40 mg, this seemed to cause increased her blood pressure.  She decreased back to 20 mg of Cymbalta recently and her blood pressure is improved.  For the time being we will continue her current blood pressure medications, she will monitor her pressures at home.  If she continues to run high I can add back daily hydrochlorothiazide  She is overdue for mammogram, apparently she has to get this scheduled through her disability lawyer.  I asked her to please look into this as I would like her to get a mammogram soon as possible Labs are up-to-date I asked her to follow-up with me in about 6 months This visit occurred during the SARS-CoV-2 public health emergency.  Safety protocols were in place, including screening questions prior to the visit, additional  usage of staff PPE, and extensive cleaning of exam room while observing appropriate contact time as indicated for disinfecting solutions.    Signed , MD

## 2020-03-18 ENCOUNTER — Encounter: Payer: Self-pay | Admitting: Family Medicine

## 2020-03-18 ENCOUNTER — Ambulatory Visit (INDEPENDENT_AMBULATORY_CARE_PROVIDER_SITE_OTHER): Payer: Self-pay | Admitting: Family Medicine

## 2020-03-18 ENCOUNTER — Telehealth: Payer: Self-pay | Admitting: Physician Assistant

## 2020-03-18 ENCOUNTER — Other Ambulatory Visit: Payer: Self-pay

## 2020-03-18 VITALS — BP 122/90 | HR 80 | Resp 17 | Ht 66.0 in | Wt 218.0 lb

## 2020-03-18 DIAGNOSIS — Z1231 Encounter for screening mammogram for malignant neoplasm of breast: Secondary | ICD-10-CM

## 2020-03-18 DIAGNOSIS — I10 Essential (primary) hypertension: Secondary | ICD-10-CM

## 2020-03-18 NOTE — Patient Instructions (Addendum)
It was good to see you again today-  Please do get your mammogram set up as soon as you are able- let me know if I can help with this Your BP seems to be on the right track Please check your BP a few times over the next couple of week- if you continue to run a bit high we can add back hctz 12.5 mg to your daily regimen  However I hope your BP will continue to improve as your body adjusts to the lower dose of cymbalta  Please plan to see me in about 6 months for a follow-up visit As your doctor and someone who cares about your personally,I do encourage you get the covid 19 vaccine!    These are our best defense against severe covid 19 pneumonia and death.    Take care

## 2020-03-18 NOTE — Telephone Encounter (Signed)
On call-pt has called her pharmacy and they say they have no RF of Xanax. According to med list, [facility] Rx was 02/21/20 w/ 5 RF and receipt confirmed from pharmacy.  I called [facility] and spoke w/ tech, who said someone had not entered in the RFs correctly. They now see the 5 RF left on Rx. Pharm will fill it.  I let pt know.

## 2020-03-19 NOTE — Telephone Encounter (Signed)
Next apt 08/2020 okay to add refills?

## 2020-03-26 ENCOUNTER — Telehealth: Payer: Self-pay | Admitting: Family Medicine

## 2020-03-26 DIAGNOSIS — R600 Localized edema: Secondary | ICD-10-CM

## 2020-03-26 NOTE — Telephone Encounter (Signed)
Caller Ashaunte Standley  Call Back # [phone]  Patient states liquid pill was supposed to be sent to pharmacy . Per patient pharmancy does not have medication.  Please advise

## 2020-03-27 MED ORDER — HYDROCHLOROTHIAZIDE 12.5 MG PO CAPS: 12.5000 mg | ORAL_CAPSULE | Freq: Every day | ORAL | 3 refills | Status: DC

## 2020-03-27 NOTE — Telephone Encounter (Signed)
Per last office visit note, you mentioned restarting her on HCTZ. Please advise on dose and sig to send to pharmacy.

## 2020-03-27 NOTE — Telephone Encounter (Signed)
Ok- we had discussed restarting HCTZ if her BP was running high.  If she wishes to take that is ok  BP Readings from Last 3 Encounters:  03/18/20 122/90  03/09/20 (!) 138/101  02/02/20 124/67   Meds ordered this encounter  Medications  . hydrochlorothiazide (MICROZIDE) 12.5 MG capsule    Sig: Take 1 capsule (12.5 mg total) by mouth daily.    Dispense:  90 capsule    Refill:  3

## 2020-03-27 NOTE — Telephone Encounter (Signed)
*   Patient states fluid pill was suppose to be sent to pharmacy.

## 2020-04-01 ENCOUNTER — Telehealth: Payer: Self-pay

## 2020-04-01 DIAGNOSIS — I10 Essential (primary) hypertension: Secondary | ICD-10-CM

## 2020-04-01 NOTE — Telephone Encounter (Signed)
Please give her a call back If she is concerned I am glad to see her, have some spots on Wednesday Is she taking the HCTZ?  The BP reading she quoted is not acutely dangerous, but we don't want her to stay that high long term.  Please see how her BP looks today and what she wants to do- thank you!

## 2020-04-01 NOTE — Telephone Encounter (Signed)
Per message---The caller states she was seen about her blood pressure about 2 wks ago. She thinks it has to do with her cymbalta, so she has been cutting back. Her last blood pressure reading was 135/99.  Does she need to come in?

## 2020-04-01 NOTE — Telephone Encounter (Signed)
Nurse Assessment Nurse: Lennette Bihari, RN, [name] Date/Time Jenna Arroyo Time): 03/31/2020 1:53:14 PM Confirm and document reason for call. If symptomatic, describe symptoms. ---The caller states she was seen about her blood pressure about 2 wks ago. She thinks it has to do with her cymbalta, so she has been cutting back. Her last blood pressure reading was 135/99. Does the patient have any new or worsening symptoms? ---Yes Will a triage be completed? ---Yes Related visit to physician within the last 2 weeks? ---N/A Does the PT have any chronic conditions? (i.e. diabetes, asthma, this includes High risk factors for pregnancy, etc.) ---Yes List chronic conditions. ---lyme disease, hashimoto, epstein barre Is the patient pregnant or possibly pregnant? (Ask all females between the ages of 63-55) ---No Is this a behavioral health or substance abuse call? ---No Guidelines Guideline Title Affirmed Question Affirmed Notes Nurse Date/Time (Eastern Time) Blood Pressure - High Systolic BP >= 160 OR Diastolic >= 100 [name] 03/31/2020 1:56:16 PM Disp. Time Jenna Arroyo Time) Disposition Final User 03/31/2020 2:02:16 PM SEE PCP WITHIN 3 DAYS Yes Lennette Bihari, RN, [name] PLEASE NOTE: All timestamps contained within this report are represented as [place] Standard Time. CONFIDENTIALTY NOTICE: This fax transmission is intended only for the addressee. It contains information that is legally privileged, confidential or otherwise protected from use or disclosure. If you are not the intended recipient, you are strictly prohibited from reviewing, disclosing, copying using or disseminating any of this information or taking any action in reliance on or regarding this information. If you have received this fax in error, please notify us immediately by telephone so that we can arrange for its return to [place]. Phone: [phone], Toll-Free: [phone], Fax: [phone] Page: 2 of 2 Call Id: [id number] Caller Disagree/Comply  Comply Caller Understands Yes PreDisposition Call Doctor Care Advice Given Per Guideline SEE PCP WITHIN 3 DAYS: * You need to be seen within 2 or 3 days. * PCP VISIT: Call your doctor (or NP/PA) during regular office hours and make an appointment. A clinic or urgent care center are good places to go for care if your doctor's office is closed or you can't get an appointment. NOTE: If office will be open tomorrow, tell caller to call then, not in 3 days. HIGH BLOOD PRESSURE: HIGH BLOOD PRESSURE - LIFESTYLE MODIFICATIONS: * The following things can help you reduce your blood pressure. * EAT HEALTHY: Eat a diet rich in fresh fruits and vegetables, dietary fiber, non-animal protein (e.g., soy), and low-fat dairy products. Avoid foods with a high content of saturated fat or cholesterol. * DECREASE SODIUM INTAKE: Aim to eat less than 2.4 g (100 mmol) of sodium each day. Unfortunately 75% of the salt in the average person's diet is in pre-processed foods. * LIMIT ALCOHOL: Limit alcoholic beverages to no more than one drink per day for women and no more than two drinks for men. A drink is 1.5 oz hard liquor (one shot or jigger; 45 ml), 5 oz wine (small glass; 150 ml), 12 oz beer (one can; 360 ml). * EXERCISE, BE MORE PHYSICALLY ACTIVE: Do at least 30 minutes of aerobic exercise (e.g., brisk walking) most days of the week. Other examples of aerobic activities cycling, jogging, and swimming. * REDUCE WEIGHT AND WAISTLINE: It is important to maintain a normal body weight. The goal should be a BMI (body mass index) under 25 for men and women, a waist circumference under 40 inches (102 cm) in men, and a waist circumference under 35 inches (88 cm) in women. *  REDUCE STRESS: Find activities that help reduce your stress. Examples might include meditation, yoga, or even a restful walk in a park. CALL BACK IF: * You become worse * Your blood pressure is over 180/110 CARE ADVICE given per High Blood Pressure (Adult)  guideline. * Weakness or numbness of the face, arm or leg on one side of the body occurs * Difficulty walking, difficulty talking, or severe headache occurs * Chest pain or difficulty breathing occurs Comments User: [name], RN Date/Time Jenna Arroyo Time): 03/31/2020 2:06:23 PM The caller states the doctor has prescribed a fluid pill but she did not pick it up from the pharmacy. We discussed fluid pills and blood pressure. The caller verbalized understanding. Referrals REFERRED TO PCP OFFICE

## 2020-04-02 NOTE — Telephone Encounter (Signed)
Caller Saniya Tranchina   Call Back # [phone]  Patient returned call. Patient requesting a call back.

## 2020-04-02 NOTE — Telephone Encounter (Signed)
Left message to return call 

## 2020-04-03 MED ORDER — IRBESARTAN-HYDROCHLOROTHIAZIDE 150-12.5 MG PO TABS: 1.0000 | ORAL_TABLET | Freq: Two times a day (BID) | ORAL | 3 refills | Status: DC

## 2020-04-03 NOTE — Telephone Encounter (Signed)
Called pt and left detailed message As BP is still running high we will increase her HCTZ - current 12.5 daily  Called in combo irbesartan/hctz pill.  Stop individual and take combo BID  Meds ordered this encounter  Medications  . irbesartan-hydrochlorothiazide (AVALIDE) 150-12.5 MG tablet    Sig: Take 1 tablet by mouth in the morning and at bedtime.    Dispense:  180 tablet    Refill:  3   Call me if any questions Continue atenolol Let me know how this works for her

## 2020-04-03 NOTE — Telephone Encounter (Signed)
Pt returning your call

## 2020-04-03 NOTE — Telephone Encounter (Signed)
Called patient back. She states bp this morning was 128/96 after medication. She was unclear of irbesartan as far as how many she was to take a day. I verified with her one tablet bid. She is taking hctz, and atenolol. She is wanting to know if medication should be changed.   Advised her, the xray results of her shoulder were not in our system as they were done at another facility. She states she was going to call them. She also is wanting to have her mammogram order placed. She would like this done at [facility] [address], [place], [place] [address].  She also complained of nausea for several months. No vomiting. No known reasoning to the nausea. Se declined an appointment. When one was offered to her she expressed "all you guys want is my money"

## 2020-04-03 NOTE — Telephone Encounter (Signed)
Patient states her diastolic still running on 90 -503 after taking the water pill. She is wondering if something else could be prescribed. Patient also states she's been nausea for months and need something for that. Patient also would like to know her shoulder x-ray results and like her mammogram to be scheduled. Patient refuses an appointment.

## 2020-04-03 NOTE — Addendum Note (Signed)
Addended by: Abbe Amsterdam C on: 04/03/2020 04:21 PM   Modules accepted: Orders

## 2020-04-04 NOTE — Telephone Encounter (Signed)
Tried calling patient, no answer. Left detailed instructions on voicemail. Advised to call back if she has any further questions.

## 2020-04-04 NOTE — Telephone Encounter (Signed)
Patient called back, she understands about blood pressure. However she states she has had Nausea for several months now. She has felt fatigue and weakness. She is concerned if this is from tapering off cymbalta. She has forgotten a few doses while in the process of decreasing. She has no other sxs relating to covid. I have scheduled patient with Dr. Laury Axon tomorrow.

## 2020-04-05 ENCOUNTER — Telehealth: Payer: Self-pay | Admitting: Psychiatry

## 2020-04-05 ENCOUNTER — Ambulatory Visit (INDEPENDENT_AMBULATORY_CARE_PROVIDER_SITE_OTHER): Payer: Self-pay | Admitting: Family Medicine

## 2020-04-05 ENCOUNTER — Encounter: Payer: Self-pay | Admitting: Family Medicine

## 2020-04-05 ENCOUNTER — Other Ambulatory Visit: Payer: Self-pay

## 2020-04-05 VITALS — BP 124/82 | HR 77 | Temp 98.3°F | Resp 18 | Ht 66.0 in | Wt 220.6 lb

## 2020-04-05 DIAGNOSIS — R110 Nausea: Secondary | ICD-10-CM

## 2020-04-05 DIAGNOSIS — R1011 Right upper quadrant pain: Secondary | ICD-10-CM

## 2020-04-05 DIAGNOSIS — K2990 Gastroduodenitis, unspecified, without bleeding: Secondary | ICD-10-CM

## 2020-04-05 DIAGNOSIS — F411 Generalized anxiety disorder: Secondary | ICD-10-CM

## 2020-04-05 DIAGNOSIS — A6920 Lyme disease, unspecified: Secondary | ICD-10-CM

## 2020-04-05 DIAGNOSIS — R5383 Other fatigue: Secondary | ICD-10-CM

## 2020-04-05 DIAGNOSIS — K449 Diaphragmatic hernia without obstruction or gangrene: Secondary | ICD-10-CM

## 2020-04-05 DIAGNOSIS — K209 Esophagitis, unspecified without bleeding: Secondary | ICD-10-CM

## 2020-04-05 DIAGNOSIS — Z8619 Personal history of other infectious and parasitic diseases: Secondary | ICD-10-CM

## 2020-04-05 DIAGNOSIS — K317 Polyp of stomach and duodenum: Secondary | ICD-10-CM

## 2020-04-05 DIAGNOSIS — K2970 Gastritis, unspecified, without bleeding: Secondary | ICD-10-CM

## 2020-04-05 MED ORDER — PANTOPRAZOLE SODIUM 40 MG PO TBEC: 40.0000 mg | DELAYED_RELEASE_TABLET | Freq: Every day | ORAL | 3 refills | Status: DC

## 2020-04-05 MED ORDER — PANTOPRAZOLE SODIUM 40 MG PO TBEC: 40.0000 mg | DELAYED_RELEASE_TABLET | Freq: Two times a day (BID) | ORAL | 3 refills | Status: DC

## 2020-04-05 NOTE — Patient Instructions (Signed)
Nausea, Adult Nausea is the feeling that you have an upset stomach or that you are about to vomit. Nausea on its own is not usually a serious concern, but it may be an early sign of a more serious medical problem. As nausea gets worse, it can lead to vomiting. If vomiting develops, or if you are not able to drink enough fluids, you are at risk of becoming dehydrated. Dehydration can make you tired and thirsty, cause you to have a dry mouth, and decrease how often you urinate. Older adults and people with other diseases or a weak disease-fighting system (immune system) are at higher risk for dehydration. The main goals of treating your nausea are:  To relieve your nausea.  To limit repeated nausea episodes.  To prevent vomiting and dehydration. Follow these instructions at home: Watch your symptoms for any changes. Tell your health care provider about them. Follow these instructions as told by your health care provider. Eating and drinking      Take an oral rehydration solution (ORS). This is a drink that is sold at pharmacies and retail stores.  Drink clear fluids slowly and in small amounts as you are able. Clear fluids include water, ice chips, low-calorie sports drinks, and fruit juice that has water added (diluted fruit juice).  Eat bland, easy-to-digest foods in small amounts as you are able. These foods include bananas, applesauce, rice, lean meats, toast, and crackers.  Avoid drinking fluids that contain a lot of sugar or caffeine, such as energy drinks, sports drinks, and soda.  Avoid alcohol.  Avoid spicy or fatty foods. General instructions  Take over-the-counter and prescription medicines only as told by your health care provider.  Rest at home while you recover.  Drink enough fluid to keep your urine pale yellow.  Breathe slowly and deeply when you feel nauseous.  Avoid smelling things that have strong odors.  Wash your hands often using soap and water. If soap and  water are not available, use hand sanitizer.  Make sure that all people in your household wash their hands well and often.  Keep all follow-up visits as told by your health care provider. This is important. Contact a health care provider if:  Your nausea gets worse.  Your nausea does not go away after two days.  You vomit.  You cannot drink fluids without vomiting.  You have any of the following: ? New symptoms. ? A fever. ? A headache. ? Muscle cramps. ? A rash. ? Pain while urinating.  You feel light-headed or dizzy. Get help right away if:  You have pain in your chest, neck, arm, or jaw.  You feel extremely weak or you faint.  You have vomit that is bright red or looks like coffee grounds.  You have bloody or black stools or stools that look like tar.  You have a severe headache, a stiff neck, or both.  You have severe pain, cramping, or bloating in your abdomen.  You have difficulty breathing or are breathing very quickly.  Your heart is beating very quickly.  Your skin feels cold and clammy.  You feel confused.  You have signs of dehydration, such as: ? Dark urine, very little urine, or no urine. ? Cracked lips. ? Dry mouth. ? Sunken eyes. ? Sleepiness. ? Weakness. These symptoms may represent a serious problem that is an emergency. Do not wait to see if the symptoms will go away. Get medical help right away. Call your local emergency services (911   in the [place] Do not drive yourself to the hospital. Summary  Nausea is the feeling that you have an upset stomach or that you are about to vomit. Nausea on its own is not usually a serious concern, but it may be an early sign of a more serious medical problem.  If vomiting develops, or if you are not able to drink enough fluids, you are at risk of becoming dehydrated.  Follow recommendations for eating and drinking and take over-the-counter and prescription medicines only as told by your health care  provider.  Contact a health care provider right away if your symptoms worsen or you have new symptoms.  Keep all follow-up visits as told by your health care provider. This is important. This information is not intended to replace advice given to you by your health care provider. Make sure you discuss any questions you have with your health care provider. Document Revised: 11/30/2017 Document Reviewed: 11/30/2017 Elsevier Patient Education  2020 [employer]  

## 2020-04-05 NOTE — Assessment & Plan Note (Signed)
Per psych  On cymbalta 20 mg

## 2020-04-05 NOTE — Assessment & Plan Note (Signed)
Chronic. 

## 2020-04-05 NOTE — Telephone Encounter (Signed)
Please review

## 2020-04-05 NOTE — Progress Notes (Signed)
Patient ID: GLAYDS INSCO, female    DOB: 07-18-1965  Age: 54 y.o. MRN: [id number]    Subjective:  Subjective  HPI Jenna Arroyo presents for nausea, fatigue  Review of Systems  Constitutional: Negative for appetite change, diaphoresis, fatigue and unexpected weight change.  Eyes: Negative for pain, redness and visual disturbance.  Respiratory: Negative for cough, chest tightness, shortness of breath and wheezing.   Cardiovascular: Negative for chest pain, palpitations and leg swelling.  Gastrointestinal: Positive for nausea.  Endocrine: Negative for cold intolerance, heat intolerance, polydipsia, polyphagia and polyuria.  Genitourinary: Negative for difficulty urinating, dysuria and frequency.  Neurological: Negative for dizziness, light-headedness, numbness and headaches.    History Past Medical History:  Diagnosis Date  . Anxiety   . High cholesterol   . Hypertension   . Lyme disease   . Thyroid disease     Jenna Arroyo has a past surgical history that includes Tonsilectomy/adenoidectomy with myringotomy; Mastoidectomy; Appendectomy; and Abdominal exploration surgery.   Her family history includes Heart disease in her father; Hypertension in her brother, mother, and sister.Jenna Arroyo reports that Jenna Arroyo has never smoked. Jenna Arroyo has never used smokeless tobacco. Jenna Arroyo reports that Jenna Arroyo does not drink alcohol and does not use drugs.  Current Outpatient Medications on File Prior to Visit  Medication Sig Dispense Refill  . albuterol (PROVENTIL HFA;VENTOLIN HFA) 108 (90 Base) MCG/ACT inhaler Inhale 1-2 puffs into the lungs every 6 (six) hours as needed. 1 Inhaler 0  . ALPRAZolam (XANAX) 0.5 MG tablet TAKE ONE TABLET BY MOUTH FOUR TIMES A DAY (ONE TABLET EVERY MORNING, ONE TABLET AT NOON, ONE TABLET IN THE EVENING, AND ONE TABLET AT BEDTIME) 120 tablet 5  . ARMOUR THYROID 15 MG tablet Take 3 tablets (45 mg total) by mouth daily. 90 tablet 3  . atenolol (TENORMIN) 25 MG tablet Take 1 tablet (25 mg total) by  mouth 2 (two) times daily. 180 tablet 2  . azelastine (ASTELIN) 0.1 % nasal spray Place 2 sprays into both nostrils 2 (two) times daily. Use in each nostril as directed 30 mL 1  . Charcoal 200 MG CAPS Take by mouth every other day.    . charcoal activated, NO SORBITOL, (ACTIDOSE-AQUA) suspension Take 50 g by mouth once. 200 mg    . Cholecalciferol (VITAMIN D) 10 MCG/ML LIQD Take by mouth.    . cholestyramine (QUESTRAN) 4 GM/DOSE powder BEGIN WITH 1/4 TSP QOD. FOLLOW PROTOCOL  1  . clotrimazole (LOTRIMIN) 1 % cream Apply to affected area 2 times daily for 2 weeks 15 g 0  . DULoxetine (CYMBALTA) 20 MG capsule Take 2 capsules (40 mg total) by mouth daily. 60 capsule 0  . ELDERBERRY PO Take by mouth.    . fluticasone (FLOVENT HFA) 110 MCG/ACT inhaler Inhale 2 puffs into the lungs 2 (two) times daily. 1 Inhaler 12  . irbesartan-hydrochlorothiazide (AVALIDE) 150-12.5 MG tablet Take 1 tablet by mouth in the morning and at bedtime. 180 tablet 3  . methylPREDNISolone (MEDROL) 4 MG tablet 5 tab po day 1, 4  tab po day 2, 3 tab po day 3, 2 tab po day 4, 1 tab po day 5. 15 tablet 0  . montelukast (SINGULAIR) 10 MG tablet Take 1 tablet (10 mg total) by mouth at bedtime. 30 tablet 0  . PREBIOTIC PRODUCT PO Take by mouth. Mix in shake and drink once a day.    . promethazine (PHENERGAN) 12.5 MG tablet Take 1 tablet (12.5 mg total) by mouth every 8 (eight)  hours as needed for nausea or vomiting. 12 tablet 0  . doxycycline (VIBRAMYCIN) 100 MG capsule Take 1 capsule (100 mg total) by mouth 2 (two) times daily. Can give caps or generic. (Patient not taking: Reported on 04/05/2020) 20 capsule 0   No current facility-administered medications on file prior to visit.     Objective:  Objective  Physical Exam Vitals and nursing note reviewed.  Constitutional:      General: Jenna Arroyo is not in acute distress.    Appearance: Jenna Arroyo is well-developed.  HENT:     Right Ear: External ear normal.     Left Ear: External ear  normal.     Nose: Nose normal.  Eyes:     Pupils: Pupils are equal, round, and reactive to light.  Cardiovascular:     Rate and Rhythm: Normal rate and regular rhythm.     Heart sounds: Normal heart sounds. No murmur heard.   Pulmonary:     Effort: Pulmonary effort is normal. No respiratory distress.     Breath sounds: Normal breath sounds. No wheezing or rales.  Chest:     Chest wall: No tenderness.  Abdominal:     General: There is no distension.     Palpations: There is no mass.     Tenderness: There is no abdominal tenderness. There is no right CVA tenderness, left CVA tenderness, guarding or rebound.     Hernia: No hernia is present.  Musculoskeletal:     Cervical back: Normal range of motion and neck supple.  Neurological:     Mental Status: Jenna Arroyo is alert and oriented to person, place, and time.  Psychiatric:        Behavior: Behavior normal.        Thought Content: Thought content normal.        Judgment: Judgment normal.    BP 124/82 (BP Location: Right Arm, Patient Position: Sitting, Cuff Size: Large)   Pulse 77   Temp 98.3 F (36.8 C) (Oral)   Resp 18   Ht 5\' 6"  (1.676 m)   Wt 220 lb 9.6 oz (100.1 kg)   SpO2 99%   BMI 35.61 kg/m  Wt Readings from Last 3 Encounters:  04/05/20 220 lb 9.6 oz (100.1 kg)  03/18/20 218 lb (98.9 kg)  03/09/20 212 lb (96.2 kg)     Lab Results  Component Value Date   WBC 9.1 02/01/2020   HGB 15.3 (H) 02/01/2020   HCT 45.7 02/01/2020   PLT 338 02/01/2020   GLUCOSE 90 02/01/2020   CHOL 280 (H) 01/17/2018   TRIG 189.0 (H) 01/17/2018   HDL 64.70 01/17/2018   LDLCALC 178 (H) 01/17/2018   ALT 17 12/11/2019   AST 13 12/11/2019   NA 137 02/01/2020   K 4.4 02/01/2020   CL 98 02/01/2020   CREATININE 0.82 02/01/2020   BUN 12 02/01/2020   CO2 28 02/01/2020   TSH 2.73 12/11/2019   MICROALBUR <0.7 12/11/2019    No results found.   Assessment & Plan:  Plan  I have discontinued Mithra Spano. [place]'s pantoprazole. I have also changed  her pantoprazole. Additionally, I am having her maintain her PREBIOTIC PRODUCT PO, cholestyramine, ELDERBERRY PO, albuterol, Charcoal, Vitamin D, charcoal activated (NO SORBITOL), clotrimazole, fluticasone, azelastine, montelukast, methylPREDNISolone, doxycycline, Armour Thyroid, promethazine, atenolol, DULoxetine, ALPRAZolam, and irbesartan-hydrochlorothiazide.  Meds ordered this encounter  Medications  . DISCONTD: pantoprazole (PROTONIX) 40 MG tablet    Sig: Take 1 tablet (40 mg total) by mouth 2 (two) times  daily. Take Protonix 40 mg BID for 8 weeks,take this medication 30 mins to 1 hour prior to morning and evening meals. Then decrease to once daily until return office appointment.    Dispense:  180 tablet    Refill:  3  . pantoprazole (PROTONIX) 40 MG tablet    Sig: Take 1 tablet (40 mg total) by mouth daily.    Dispense:  90 tablet    Refill:  3    Problem List Items Addressed This Visit      Unprioritized   GAD (generalized anxiety disorder)    Per psych  On cymbalta 20 mg       Lyme disease (Chronic)    Chronic       Nausea - Primary    protonix daily  May need GI f/u       Relevant Orders   B. burgdorfi antibodies   Rocky mtn spotted fvr abs pnl(IgG+IgM)   Comprehensive metabolic panel   POCT Urinalysis Dipstick (Automated)   CBC with Differential/Platelet    Other Visit Diagnoses    Right upper quadrant abdominal pain       Relevant Medications   pantoprazole (PROTONIX) 40 MG tablet   Gastritis and gastroduodenitis       Relevant Medications   pantoprazole (PROTONIX) 40 MG tablet   Acute esophagitis       Relevant Medications   pantoprazole (PROTONIX) 40 MG tablet   Hiatal hernia       Relevant Medications   pantoprazole (PROTONIX) 40 MG tablet   Multiple gastric polyps       Relevant Medications   pantoprazole (PROTONIX) 40 MG tablet   History of Lyme disease       Relevant Orders   B. burgdorfi antibodies   Rocky mtn spotted fvr abs pnl(IgG+IgM)    Comprehensive metabolic panel   Other fatigue       Relevant Orders   B. burgdorfi antibodies   Rocky mtn spotted fvr abs pnl(IgG+IgM)   Comprehensive metabolic panel   CBC with Differential/Platelet      Follow-up: Return if symptoms worsen or fail to improve.  Donato Schultz, DO

## 2020-04-05 NOTE — Assessment & Plan Note (Signed)
protonix daily  May need GI f/u

## 2020-04-05 NOTE — Telephone Encounter (Signed)
Pt is coming off the cymbalta 60 mg to 20 mg and she is not able to sleep. She also feels nas[place]. She would like to know if she can go on hydroxine to help her sleep. Please give her a call at [phone]

## 2020-04-08 ENCOUNTER — Other Ambulatory Visit: Payer: Self-pay

## 2020-04-08 MED ORDER — HYDROXYZINE PAMOATE 25 MG PO CAPS: 25.0000 mg | ORAL_CAPSULE | Freq: Every evening | ORAL | 0 refills | Status: DC | PRN

## 2020-04-08 NOTE — Telephone Encounter (Signed)
Rather than treating the nausea with Phenergan which is going to make her sleepy we need to treat the problem that is causing the nausea which is she reduced to the duloxetine too quickly.  She should increase the duloxetine back to 40 mg and do that for 2 weeks and then drop it back to 20 and that should resolve the nausea.  After 2 weeks of 20 mg of duloxetine she should be able to come off of it without withdrawal symptoms.  If she has withdrawal symptoms let me know.  I do not want to send in a Phenergan prescription I want to solve the problem in the appropriate manner.

## 2020-04-08 NOTE — Telephone Encounter (Signed)
Left her a vm to return my call.

## 2020-04-08 NOTE — Telephone Encounter (Signed)
Rx for Hydroxyzine 25 mg 1 q hs prn #30 sent to [facility] in [facility].

## 2020-04-08 NOTE — Telephone Encounter (Signed)
She can have hydroxyzine 25 mg 1 as needed nightly #30 no refills.  Make sure that she is reduced the duloxetine gradually spending at least 2 weeks at 40 before she dropped to 20 mg.  If she did so and is still having nausea suggest that we do 2 weeks of 30 mg, which we will have to call and and then drop again to 20 mg and that should resolve the nausea.  Nausea is suggestive of serotonin withdrawal and suggestive that we are decreasing too quickly.

## 2020-04-08 NOTE — Telephone Encounter (Signed)
Pt. Made aware. She did NOT wait 2 weeks to drop down to the 20 mg. She is currently at 20 mg daily. She appreciates the Hydroxyzine and wants to know if there is any way to send her in some Phenergan? Other than the not sleeping and nausea she feels fine. Please advise.

## 2020-04-09 LAB — CBC WITH DIFFERENTIAL/PLATELET
Absolute Monocytes: 407 cells/uL (ref 200–950)
Basophils Absolute: 41 cells/uL (ref 0–200)
Basophils Relative: 0.6 %
Eosinophils Absolute: 173 cells/uL (ref 15–500)
Eosinophils Relative: 2.5 %
HCT: 41.9 % (ref 35.0–45.0)
Hemoglobin: 14.1 g/dL (ref 11.7–15.5)
Lymphs Abs: 2105 cells/uL (ref 850–3900)
MCH: 30.3 pg (ref 27.0–33.0)
MCHC: 33.7 g/dL (ref 32.0–36.0)
MCV: 89.9 fL (ref 80.0–100.0)
MPV: 9.3 fL (ref 7.5–12.5)
Monocytes Relative: 5.9 %
Neutro Abs: 4175 cells/uL (ref 1500–7800)
Neutrophils Relative %: 60.5 %
Platelets: 336 10*3/uL (ref 140–400)
RBC: 4.66 10*6/uL (ref 3.80–5.10)
RDW: 12.8 % (ref 11.0–15.0)
Total Lymphocyte: 30.5 %
WBC: 6.9 10*3/uL (ref 3.8–10.8)

## 2020-04-09 LAB — COMPREHENSIVE METABOLIC PANEL
AG Ratio: 1.7 (calc) (ref 1.0–2.5)
ALT: 26 U/L (ref 6–29)
AST: 17 U/L (ref 10–35)
Albumin: 4.1 g/dL (ref 3.6–5.1)
Alkaline phosphatase (APISO): 89 U/L (ref 37–153)
BUN: 9 mg/dL (ref 7–25)
CO2: 27 mmol/L (ref 20–32)
Calcium: 9.5 mg/dL (ref 8.6–10.4)
Chloride: 99 mmol/L (ref 98–110)
Creat: 0.85 mg/dL (ref 0.50–1.05)
Globulin: 2.4 g/dL (calc) (ref 1.9–3.7)
Glucose, Bld: 96 mg/dL (ref 65–99)
Potassium: 4.3 mmol/L (ref 3.5–5.3)
Sodium: 138 mmol/L (ref 135–146)
Total Bilirubin: 0.6 mg/dL (ref 0.2–1.2)
Total Protein: 6.5 g/dL (ref 6.1–8.1)

## 2020-04-09 LAB — B. BURGDORFI ANTIBODIES: B burgdorferi Ab IgG+IgM: <0.9 index

## 2020-04-09 LAB — ROCKY MTN SPOTTED FVR ABS PNL(IGG+IGM)
RMSF IgG: NOT DETECTED
RMSF IgM: NOT DETECTED

## 2020-05-28 ENCOUNTER — Other Ambulatory Visit: Payer: Self-pay | Admitting: Psychiatry

## 2020-05-28 DIAGNOSIS — F411 Generalized anxiety disorder: Secondary | ICD-10-CM

## 2020-05-28 DIAGNOSIS — F900 Attention-deficit hyperactivity disorder, predominantly inattentive type: Secondary | ICD-10-CM

## 2020-05-28 DIAGNOSIS — Z8659 Personal history of other mental and behavioral disorders: Secondary | ICD-10-CM

## 2020-05-28 MED ORDER — DULOXETINE HCL 20 MG PO CPEP: 40.0000 mg | ORAL_CAPSULE | Freq: Every day | ORAL | 5 refills | Status: DC

## 2020-06-10 ENCOUNTER — Telehealth: Payer: Self-pay | Admitting: Family Medicine

## 2020-06-10 NOTE — Telephone Encounter (Signed)
Call back number: [phone]   Patient states that she has had trouble eating & high BP the past few days   133/69 last reading.  Patient states bottom number staying high thru readings.   Had bbq this past weekend does not no if it could be what she has been eating   Please advise

## 2020-06-11 NOTE — Telephone Encounter (Signed)
Left message to return call. Needing more information or if patient is experiencing any symptoms in detail. Ok for another assistant to take call if I have left for today.

## 2020-06-11 NOTE — Telephone Encounter (Signed)
Spoke with patient she states this mornign her BP read 135/92. She is experiencing headache and fatigue. She has not missed any doses. She is taking atenolol 25 mg bid, Irbesartan 150 mg bid and hctz 12.5 in morning and states "some times at night". She states she has not been able to purchase the combination irb-hctz medication yet. She would like further instruction on what to do.   I advised her I would send her message to Dr. Alva Garnet interrupted me and said she spoke with you at her last visit and you agreed to take her on as a patient. She states she did not like the way Dr. Patsy Lager suggested the covid vaccine. I advised her this is part of our job duties and expressed the importance of the vaccine. She stated "she has her own opinion and does not wish to discuss it"   Please advise on BP. I have switched pcp from [facility] to [facility].

## 2020-06-11 NOTE — Telephone Encounter (Signed)
I advised previously she had to see me to establish with me  and I also recommend covid vaccine to patients She needs an office visit in person  She can take 25 mg hctz

## 2020-06-11 NOTE — Telephone Encounter (Signed)
Please advise patient.  

## 2020-06-12 ENCOUNTER — Other Ambulatory Visit: Payer: Self-pay | Admitting: Psychiatry

## 2020-06-12 DIAGNOSIS — I10 Essential (primary) hypertension: Secondary | ICD-10-CM

## 2020-06-12 NOTE — Telephone Encounter (Signed)
Pt called. LDVM 

## 2020-06-14 ENCOUNTER — Ambulatory Visit (INDEPENDENT_AMBULATORY_CARE_PROVIDER_SITE_OTHER): Payer: Self-pay | Admitting: Family Medicine

## 2020-06-14 ENCOUNTER — Encounter: Payer: Self-pay | Admitting: Family Medicine

## 2020-06-14 ENCOUNTER — Telehealth: Payer: Self-pay | Admitting: Family Medicine

## 2020-06-14 ENCOUNTER — Other Ambulatory Visit: Payer: Self-pay

## 2020-06-14 ENCOUNTER — Other Ambulatory Visit: Payer: Self-pay | Admitting: Family Medicine

## 2020-06-14 VITALS — BP 110/80 | HR 92 | Temp 99.1°F | Resp 18 | Ht 66.0 in | Wt 224.2 lb

## 2020-06-14 DIAGNOSIS — E039 Hypothyroidism, unspecified: Secondary | ICD-10-CM

## 2020-06-14 DIAGNOSIS — D649 Anemia, unspecified: Secondary | ICD-10-CM

## 2020-06-14 DIAGNOSIS — Z1159 Encounter for screening for other viral diseases: Secondary | ICD-10-CM

## 2020-06-14 DIAGNOSIS — R5381 Other malaise: Secondary | ICD-10-CM

## 2020-06-14 NOTE — Patient Instructions (Signed)
Thyroid-Stimulating Hormone Test Why am I having this test? You may have a thyroid-stimulating hormone (TSH) test if you have possible symptoms of abnormal thyroid hormone levels. This test can help your health care provider:  Diagnose a disorder of the thyroid gland or pituitary gland.  Manage your condition and treatment if you have an underactive thyroid (hypothyroidism) or an overactive thyroid (hyperthyroidism). Newborn babies may have this test done to screen for hypothyroidism that is present at birth (congenital). The thyroid is a gland in the lower front of the neck. It makes hormones that affect many body parts and systems, including the system that affects how quickly the body burns fuel for energy (metabolism). The pituitary gland is located just below the brain, behind the eyes and nasal passages. It helps maintain thyroid hormone levels and thyroid gland function. What is being tested? This test measures the amount of TSH in your blood. TSH may also be called thyrotropin. When the thyroid does not make enough hormones, the pituitary gland releases TSH into the bloodstream to stimulate the thyroid gland to make more hormones. What kind of sample is taken?     A blood sample is required for this test. It is usually collected by inserting a needle into a blood vessel. For newborns, a small amount of blood may be collected from the umbilical cord, or by using a small needle to prick the baby's heel (heel stick). Tell a health care provider about:  All medicines you are taking, including vitamins, herbs, eye drops, creams, and over-the-counter medicines.  Any blood disorders you have.  Any surgeries you have had.  Any medical conditions you have.  Whether you are pregnant or may be pregnant. How are the results reported? Your test results will be reported as a value that indicates how much TSH is in your blood. Your health care provider will compare your results to normal ranges  that were established after testing a large group of people (reference ranges). Reference ranges may vary among labs and hospitals. For this test, common reference ranges are:  Adult: 2-10 microunits/mL or 2-10 milliunits/L.  Newborn: ? Heel stick: 3-18 microunits/mL or 3-18 milliunits/L. ? Umbilical cord: 3-12 microunits/mL or 3-12 milliunits/L. What do the results mean? Results that are within the reference range are considered normal. This means that you have a normal amount of TSH in your blood. Results that are higher than the reference range mean that your TSH levels are too high. This may mean:  Your thyroid gland is not making enough thyroid hormones.  Your thyroid medicine dosage is too low.  You have a tumor on your pituitary gland. This is rare. Results that are lower than the reference range mean that your TSH levels are too low. This may be caused by hyperthyroidism or by a problem with the pituitary gland function. Talk with your health care provider about what your results mean. Questions to ask your health care provider Ask your health care provider, or the department that is doing the test:  When will my results be ready?  How will I get my results?  What are my treatment options?  What other tests do I need?  What are my next steps? Summary  You may have a thyroid-stimulating hormone (TSH) test if you have possible symptoms of abnormal thyroid hormone levels.  The thyroid is a gland in the lower front of the neck. It makes hormones that affect many body parts and systems.  The pituitary gland is located   just below the brain, behind the eyes and nasal passages. It helps maintain thyroid hormone levels and thyroid gland function.  This test measures the amount of TSH in your blood. TSH is made by the pituitary gland. It may also be called thyrotropin. This information is not intended to replace advice given to you by your health care provider. Make sure you  discuss any questions you have with your health care provider. Document Revised: 09/20/2017 Document Reviewed: 02/23/2017 Elsevier Patient Education  2020 [employer]  

## 2020-06-14 NOTE — Assessment & Plan Note (Signed)
.  check labs for endo F/u endo

## 2020-06-14 NOTE — Telephone Encounter (Signed)
Caller: Estelle  States she [facility] [address]. The schedulers name is Marcelino Duster [phone]. Patient states referral has to say STAT

## 2020-06-14 NOTE — Assessment & Plan Note (Signed)
Endo requesting labs We will fax to Dr Roanna Raider once recieved

## 2020-06-14 NOTE — Progress Notes (Signed)
Patient ID: Jenna Arroyo, female    DOB: October 07, 1965  Age: 54 y.o. MRN: [id number]    Subjective:  Subjective  HPI Jenna Arroyo presents for f/u endo --- she needs her labs checked for endo but can not afford them at [facility] She also needs to re schedule her app with breast clinic   Review of Systems  Constitutional: Positive for fatigue. Negative for appetite change, diaphoresis and unexpected weight change.  Eyes: Negative for pain, redness and visual disturbance.  Respiratory: Negative for cough, chest tightness, shortness of breath and wheezing.   Cardiovascular: Negative for chest pain, palpitations and leg swelling.  Endocrine: Negative for cold intolerance, heat intolerance, polydipsia, polyphagia and polyuria.  Genitourinary: Negative for difficulty urinating, dysuria and frequency.  Neurological: Negative for dizziness, light-headedness, numbness and headaches.    History Past Medical History:  Diagnosis Date  . Anxiety   . High cholesterol   . Hypertension   . Lyme disease   . Thyroid disease     She has a past surgical history that includes Tonsilectomy/adenoidectomy with myringotomy; Mastoidectomy; Appendectomy; and Abdominal exploration surgery.   Her family history includes Heart disease in her father; Hypertension in her brother, mother, and sister.She reports that she has never smoked. She has never used smokeless tobacco. She reports that she does not drink alcohol and does not use drugs.  Current Outpatient Medications on File Prior to Visit  Medication Sig Dispense Refill  . albuterol (PROVENTIL HFA;VENTOLIN HFA) 108 (90 Base) MCG/ACT inhaler Inhale 1-2 puffs into the lungs every 6 (six) hours as needed. 1 Inhaler 0  . ALPRAZolam (XANAX) 0.5 MG tablet TAKE ONE TABLET BY MOUTH FOUR TIMES A DAY (ONE TABLET EVERY MORNING, ONE TABLET AT NOON, ONE TABLET IN THE EVENING, AND ONE TABLET AT BEDTIME) 120 tablet 5  . ARMOUR THYROID 15 MG tablet Take 3 tablets (45 mg total)  by mouth daily. 90 tablet 3  . atenolol (TENORMIN) 25 MG tablet Take 1 tablet (25 mg total) by mouth 2 (two) times daily. 180 tablet 2  . azelastine (ASTELIN) 0.1 % nasal spray Place 2 sprays into both nostrils 2 (two) times daily. Use in each nostril as directed 30 mL 1  . Charcoal 200 MG CAPS Take by mouth every other day.    . charcoal activated, NO SORBITOL, (ACTIDOSE-AQUA) suspension Take 50 g by mouth once. 200 mg    . Cholecalciferol (VITAMIN D) 10 MCG/ML LIQD Take by mouth.    . cholestyramine (QUESTRAN) 4 GM/DOSE powder BEGIN WITH 1/4 TSP QOD. FOLLOW PROTOCOL  1  . clotrimazole (LOTRIMIN) 1 % cream Apply to affected area 2 times daily for 2 weeks 15 g 0  . DULoxetine (CYMBALTA) 20 MG capsule Take 2 capsules (40 mg total) by mouth daily. 60 capsule 5  . ELDERBERRY PO Take by mouth.    . fluticasone (FLOVENT HFA) 110 MCG/ACT inhaler Inhale 2 puffs into the lungs 2 (two) times daily. 1 Inhaler 12  . hydrOXYzine (VISTARIL) 25 MG capsule Take 1 capsule (25 mg total) by mouth at bedtime as needed. 30 capsule 0  . irbesartan-hydrochlorothiazide (AVALIDE) 150-12.5 MG tablet Take 1 tablet by mouth in the morning and at bedtime. 180 tablet 3  . methylPREDNISolone (MEDROL) 4 MG tablet 5 tab po day 1, 4  tab po day 2, 3 tab po day 3, 2 tab po day 4, 1 tab po day 5. 15 tablet 0  . montelukast (SINGULAIR) 10 MG tablet Take 1  tablet (10 mg total) by mouth at bedtime. 30 tablet 0  . pantoprazole (PROTONIX) 40 MG tablet Take 1 tablet (40 mg total) by mouth daily. 90 tablet 3  . PREBIOTIC PRODUCT PO Take by mouth. Mix in shake and drink once a day.    . promethazine (PHENERGAN) 12.5 MG tablet Take 1 tablet (12.5 mg total) by mouth every 8 (eight) hours as needed for nausea or vomiting. 12 tablet 0   No current facility-administered medications on file prior to visit.     Objective:  Objective  Physical Exam Vitals and nursing note reviewed.  Constitutional:      Appearance: She is well-developed  and well-nourished.  HENT:     Head: Normocephalic and atraumatic.  Eyes:     Extraocular Movements: EOM normal.     Conjunctiva/sclera: Conjunctivae normal.  Neck:     Thyroid: No thyromegaly.     Vascular: No carotid bruit or JVD.  Cardiovascular:     Rate and Rhythm: Normal rate and regular rhythm.     Heart sounds: Normal heart sounds. No murmur heard.   Pulmonary:     Effort: Pulmonary effort is normal. No respiratory distress.     Breath sounds: Normal breath sounds. No wheezing or rales.  Chest:     Chest wall: No tenderness.  Musculoskeletal:        General: No edema.     Cervical back: Normal range of motion and neck supple.  Neurological:     Mental Status: She is alert and oriented to person, place, and time.  Psychiatric:        Mood and Affect: Mood and affect normal.    BP 110/80 (BP Location: Left Arm, Patient Position: Sitting, Cuff Size: Large)   Pulse 92   Temp 99.1 F (37.3 C) (Oral)   Resp 18   Ht 5\' 6"  (1.676 m)   Wt 224 lb 3.2 oz (101.7 kg)   SpO2 92%   BMI 36.19 kg/m  Wt Readings from Last 3 Encounters:  06/14/20 224 lb 3.2 oz (101.7 kg)  04/05/20 220 lb 9.6 oz (100.1 kg)  03/18/20 218 lb (98.9 kg)     Lab Results  Component Value Date   WBC 6.9 04/05/2020   HGB 14.1 04/05/2020   HCT 41.9 04/05/2020   PLT 336 04/05/2020   GLUCOSE 96 04/05/2020   CHOL 280 (H) 01/17/2018   TRIG 189.0 (H) 01/17/2018   HDL 64.70 01/17/2018   LDLCALC 178 (H) 01/17/2018   ALT 26 04/05/2020   AST 17 04/05/2020   NA 138 04/05/2020   K 4.3 04/05/2020   CL 99 04/05/2020   CREATININE 0.85 04/05/2020   BUN 9 04/05/2020   CO2 27 04/05/2020   TSH 2.73 12/11/2019   MICROALBUR <0.7 12/11/2019    No results found.   Assessment & Plan:  Plan  I have discontinued Jenna Even. Arroyo's doxycycline. I am also having her maintain her PREBIOTIC PRODUCT PO, cholestyramine, ELDERBERRY PO, albuterol, Charcoal, Vitamin D, charcoal activated (NO SORBITOL), clotrimazole,  fluticasone, azelastine, montelukast, methylPREDNISolone, Armour Thyroid, promethazine, atenolol, ALPRAZolam, irbesartan-hydrochlorothiazide, pantoprazole, hydrOXYzine, and DULoxetine.  No orders of the defined types were placed in this encounter.   Problem List Items Addressed This Visit      Unprioritized   Anemia    Endo requesting labs We will fax to Dr Rosezella [place] once recieved      Relevant Orders   Iron, TIBC and Ferritin Panel   Vitamin B12   Hypothyroidism -  Primary    .check labs for endo F/u endo       Relevant Orders   T3, free   T4, free   TSH    Other Visit Diagnoses    Need for hepatitis C screening test       Relevant Orders   Hepatitis C antibody      Follow-up: Return in about 6 months (around 12/13/2020), or if symptoms worsen or fail to improve, for hypertension.  Donato Schultz, DO

## 2020-06-14 NOTE — Telephone Encounter (Signed)
Referral placed during visit.

## 2020-06-15 LAB — VITAMIN B12: Vitamin B-12: 727 pg/mL (ref 200–1100)

## 2020-06-15 LAB — IRON,TIBC AND FERRITIN PANEL
%SAT: 18 % (calc) (ref 16–45)
Ferritin: 71 ng/mL (ref 16–232)
Iron: 60 ug/dL (ref 45–160)
TIBC: 341 mcg/dL (calc) (ref 250–450)

## 2020-06-15 LAB — TSH: TSH: 2.09 mIU/L

## 2020-06-15 LAB — T3, FREE: T3, Free: 3.5 pg/mL (ref 2.3–4.2)

## 2020-06-15 LAB — T4, FREE: Free T4: 1 ng/dL (ref 0.8–1.8)

## 2020-06-17 ENCOUNTER — Other Ambulatory Visit: Payer: Self-pay | Admitting: Obstetrics and Gynecology

## 2020-06-17 ENCOUNTER — Ambulatory Visit: Payer: Self-pay

## 2020-06-17 DIAGNOSIS — N644 Mastodynia: Secondary | ICD-10-CM

## 2020-06-17 DIAGNOSIS — N6310 Unspecified lump in the right breast, unspecified quadrant: Secondary | ICD-10-CM

## 2020-06-17 DIAGNOSIS — I890 Lymphedema, not elsewhere classified: Secondary | ICD-10-CM

## 2020-06-17 DIAGNOSIS — N6320 Unspecified lump in the left breast, unspecified quadrant: Secondary | ICD-10-CM

## 2020-06-17 LAB — HEPATITIS C ANTIBODY
Hepatitis C Ab: NONREACTIVE
SIGNAL TO CUT-OFF: 0.01 (ref <1.00)

## 2020-06-18 ENCOUNTER — Ambulatory Visit: Payer: No Typology Code available for payment source | Admitting: *Deleted

## 2020-06-18 ENCOUNTER — Other Ambulatory Visit: Payer: Self-pay

## 2020-06-18 VITALS — BP 132/80

## 2020-06-18 DIAGNOSIS — Z1239 Encounter for other screening for malignant neoplasm of breast: Secondary | ICD-10-CM

## 2020-06-18 DIAGNOSIS — N6321 Unspecified lump in the left breast, upper outer quadrant: Secondary | ICD-10-CM

## 2020-06-18 DIAGNOSIS — N644 Mastodynia: Secondary | ICD-10-CM

## 2020-06-18 NOTE — Progress Notes (Signed)
Jenna Arroyo is a 54 y.o. female who presents to [facility] clinic today with complaint of bilateral breast pain and axillary lumps since July 2021. Patient states the pain comes and goes. Patient rates the pain at a 4 out of 10.    Pap Smear: Pap smear not completed today. Last Pap smear was 04/13/2019 at [facility] clinic and was normal with negative HPV. Per patient has no history of an abnormal Pap smear. Last Pap smear result is available in Epic.   Physical exam: Breasts Breasts symmetrical. No skin abnormalities bilateral breasts. No nipple retraction bilateral breasts. No nipple discharge bilateral breasts. No lymphadenopathy. No lumps palpated right breast. Palpated a lump within the patients left breast at 1 o'clock 16 cm from the nipple. Complaints of diffuse left breast pain on exam. Complaints of right outer breast and axillary pain on exam.   Pelvic/Bimanual Pap is not indicated today per BCCCP guidelines.   Smoking History: Patient is a former smoker that quit in 2006.   Patient Navigation: Patient education provided. Access to services provided for patient through [facility] program.  Colorectal Cancer Screening: Patient has had colonoscopy completed on 03/29/2018. No complaints today.    Breast and Cervical Cancer Risk Assessment: Patient does not have family history of breast cancer, known genetic mutations, or radiation treatment to the chest before age 48. Patient does not have history of cervical dysplasia, immunocompromised, or DES exposure in-utero.  Risk Assessment    Risk Scores      06/18/2020   Last edited by: Meryl Dare, CMA   5-year risk: 1 %   Lifetime risk: 7.5 %          A: BCCCP exam without pap smear Complaint of bilateral breast pain and bilateral axillary lumps.  P: Referred patient to the [facility] for a diagnostic mammogram. Appointment scheduled Wednesday, June 19, 2020 at 1510.  Jenna Heidelberg, RN 06/18/2020 8:24 AM

## 2020-06-18 NOTE — Patient Instructions (Addendum)
Explained breast self awareness with [name]. Patient did not need a Pap smear today due to last Pap smear and HPV typing was 04/13/2019. Let her know BCCCP will cover Pap smears and HPV typing every 5 years unless has a history of abnormal Pap smears. Referred patient to the [facility] for a diagnostic mammogram. Appointment scheduled Wednesday, June 19, 2020 at 1510. Patient aware of appointment and will be there. Jenna Arroyo verbalized understanding.  [name], [name], RN 8:24 AM

## 2020-06-19 ENCOUNTER — Ambulatory Visit
Admission: RE | Admit: 2020-06-19 | Discharge: 2020-06-19 | Disposition: A | Discharge status: Home / Self Care | Payer: No Typology Code available for payment source | Source: Ambulatory Visit | Attending: Obstetrics and Gynecology | Admitting: Obstetrics and Gynecology

## 2020-06-19 DIAGNOSIS — N6320 Unspecified lump in the left breast, unspecified quadrant: Secondary | ICD-10-CM

## 2020-06-19 DIAGNOSIS — N6310 Unspecified lump in the right breast, unspecified quadrant: Secondary | ICD-10-CM

## 2020-06-19 DIAGNOSIS — I890 Lymphedema, not elsewhere classified: Secondary | ICD-10-CM

## 2020-06-19 DIAGNOSIS — N644 Mastodynia: Secondary | ICD-10-CM

## 2020-07-02 ENCOUNTER — Telehealth: Payer: Self-pay | Admitting: Family Medicine

## 2020-07-02 NOTE — Telephone Encounter (Signed)
Called patient, explained to her that she would need to actually test positive for Covid prior to receiving the infusion. She states she has not been tested. She is not vaccinated. She states she will quarantine and let us know if she needs anything or decides to be tested.

## 2020-07-02 NOTE — Telephone Encounter (Signed)
Caller Jenna Arroyo Call Back @ [phone]  Requesting Antibody Infusion   Patient states she is experiencing Covid 19 symptoms low grade fever, headache and diarrhea. Fever is 99.1. Pt is immuno comprised.  Please Adivse

## 2020-07-12 ENCOUNTER — Ambulatory Visit: Payer: No Typology Code available for payment source | Admitting: Family Medicine

## 2020-07-19 ENCOUNTER — Ambulatory Visit: Payer: No Typology Code available for payment source | Admitting: Family Medicine

## 2020-08-21 ENCOUNTER — Telehealth (INDEPENDENT_AMBULATORY_CARE_PROVIDER_SITE_OTHER): Payer: Self-pay | Admitting: Psychiatry

## 2020-08-21 ENCOUNTER — Encounter: Payer: Self-pay | Admitting: Psychiatry

## 2020-08-21 DIAGNOSIS — I10 Essential (primary) hypertension: Secondary | ICD-10-CM

## 2020-08-21 DIAGNOSIS — F900 Attention-deficit hyperactivity disorder, predominantly inattentive type: Secondary | ICD-10-CM

## 2020-08-21 DIAGNOSIS — F411 Generalized anxiety disorder: Secondary | ICD-10-CM

## 2020-08-21 DIAGNOSIS — Z8659 Personal history of other mental and behavioral disorders: Secondary | ICD-10-CM

## 2020-08-21 MED ORDER — ALPRAZOLAM 0.5 MG PO TABS
ORAL_TABLET | ORAL | 5 refills | Status: DC
Start: 2020-08-21 — End: 2021-03-11

## 2020-08-21 MED ORDER — ESCITALOPRAM OXALATE 5 MG PO TABS: 5.0000 mg | ORAL_TABLET | Freq: Every day | ORAL | 1 refills | Status: DC

## 2020-08-21 NOTE — Progress Notes (Signed)
[name] [id number] [birth year] 55 y.o.  Virtual Visit via Telephone Note  I connected with pt by telephone and verified that I am speaking with the correct person using two identifiers.   I discussed the limitations, risks, security and privacy concerns of performing an evaluation and management service by telephone and the availability of in person appointments. I also discussed with the patient that there may be a patient responsible charge related to this service. The patient expressed understanding and agreed to proceed.  I discussed the assessment and treatment plan with the patient. The patient was provided an opportunity to ask questions and all were answered. The patient agreed with the plan and demonstrated an understanding of the instructions.   The patient was advised to call back or seek an in-person evaluation if the symptoms worsen or if the condition fails to improve as anticipated.  I provided 15 minutes of non-face-to-face time during this encounter. The call started at 1 and ended at 115. The patient was located at home and the provider was located office.   Subjective:   Patient ID:  Jenna Arroyo is a 55 y.o. (DOB 10/19/1965) female.  Chief Complaint:  Chief Complaint  Patient presents with  . Follow-up  . GAD (generalized anxiety disorder)    Anxiety Symptoms include nervous/anxious behavior. Patient reports no confusion, decreased concentration or suicidal ideas.     Jenna Arroyo presents to the office today for follow-up of anxiety and mood.  Last seen October 2020.  She had been having cognitive complaints as previously noted. Still recommend trial switch to Ativan 1 mg QID to see if cognition is better.  Her cognitive problems have frequently been in a contributor to her difficulty maintaining employment.  She is fearful about having Xanax withdrawal.  This was discussed in detail.  Also discussed in detail the potential sedative effects of benzodiazepines  and especially in the first week of the transition from 1 of the medications to another.  It is likely that she will be more successful with the transition if she is a little more sleepy than normal the first week by using half of each medication then if we abruptly switch medications.  That has a much lower success rate in her case because of her worry about the transition. She agrees to a trial.  Will do 1/2 of 0.5 mg tablet tablet of Xanax and 1/2 of 1 mg tablet Ativan QID for 1 week. Then DC alprazolam and take Ativan 1 mg 3 times daily or 4 times daily to control anxiety.  Call back in 2 to 3 weeks to give a report of how well she is doing with the new medication. DC hydroxyzine because of its potential to cause cognitive problems. Continue duloxetine 60 mg daily.  10/18/19 appt with the following noted and no further med changes: She had difficulty implementing the transition and then complained of insomnia with lorazepam and was switched back to alprazolam after a week.  Also CO pain and nausea.   Overall doing OK except son and D upset with her since December.  Disc this in detail.  Hasn't seen GD's since then.  Working at job she loves but very PT. Exercising and lost 18#  02/21/20 with the following noted: Hanging in there with stress.  Not allowed to see GD still.  Doesn't know why. No Covid.  OCC BP spikes. No major med changes. Satisfied with duloxetine and Xanax.  No full panic.   Sleep  waxes and wanes but lately good.  Working same job as in April and likes it. Plan: no med changes  08/21/2020 appointment with the following noted: Stopped duloxetine for a week early Feb over BP concerns from it.  Convinced BP affected by 20 mg duloxetine which she's been taking for a few months.  Felt 30 mg was too high and then it started causing problems.  When off the duloxetine for a week then BP went down. Times when doesn't have it and the most noticeable thing is feeling tired and a little  disconnected.  Longest off it was 2-4 weeks in last 6 mos. Has unresponsive PCP. Chronic tiredness.  Still dealing with chronic Lyme dz.   Patient reports stable mood and denies depressed or irritable moods.  Patient denies any recent difficulty with anxiety.  sleep affected by smokers around her apt.  Denies appetite disturbance.  Patient reports that energy and motivation have been good.  Patient denies any difficulty with concentration.  Patient denies any suicidal ideation. No panic.  Anxiety manageable.  Using Xanax mostly TID.  On duloxetine.  Sleep better in the day.    No questions concerns re: meds.  Past Psychiatric Medication Trials: Duloxetine 60, imipramine, desipramine, sertraline hyper, citalopram side effects, paroxetine chest pain, Nuvigil edgy, buspirone dizzy, Xanax, Ambien anxiety, trazodone side effects, atenolol  Review of Systems:  Review of Systems  Constitutional: Positive for fatigue.       Low exercise tolerance  Gastrointestinal: Negative for vomiting.  Musculoskeletal: Positive for arthralgias and neck pain.  Neurological: Positive for headaches. Negative for tremors and weakness.       Foggy headed.  Psychiatric/Behavioral: Positive for sleep disturbance. Negative for agitation, behavioral problems, confusion, decreased concentration, dysphoric mood, hallucinations, self-injury and suicidal ideas. The patient is nervous/anxious. The patient is not hyperactive.     Medications: I have reviewed the patient's current medications.  Current Outpatient Medications  Medication Sig Dispense Refill  . ALPRAZolam (XANAX) 0.5 MG tablet TAKE ONE TABLET BY MOUTH FOUR TIMES A DAY (ONE TABLET EVERY MORNING, ONE TABLET AT NOON, ONE TABLET IN THE EVENING, AND ONE TABLET AT BEDTIME) (Patient taking differently: TAKE ONE TABLET BY MOUTH THREE TIMES A DAY (ONE TABLET EVERY MORNING, ONE TABLET IN THE EVENING, AND ONE TABLET AT BEDTIME)) 120 tablet 5  . ARMOUR THYROID 15 MG  tablet Take 3 tablets (45 mg total) by mouth daily. 90 tablet 3  . atenolol (TENORMIN) 25 MG tablet Take 1 tablet by mouth twice daily 60 tablet 3  . irbesartan (AVAPRO) 150 MG tablet Take 150 mg by mouth daily. 1 tab in am,1 tab hs    . albuterol (PROVENTIL HFA;VENTOLIN HFA) 108 (90 Base) MCG/ACT inhaler Inhale 1-2 puffs into the lungs every 6 (six) hours as needed. (Patient not taking: No sig reported) 1 Inhaler 0  . azelastine (ASTELIN) 0.1 % nasal spray Place 2 sprays into both nostrils 2 (two) times daily. Use in each nostril as directed (Patient not taking: No sig reported) 30 mL 1  . Charcoal 200 MG CAPS Take by mouth every other day. (Patient not taking: No sig reported)    . charcoal activated, NO SORBITOL, (ACTIDOSE-AQUA) suspension Take 50 g by mouth once. 200 mg (Patient not taking: No sig reported)    . Cholecalciferol (VITAMIN D) 10 MCG/ML LIQD Take by mouth. (Patient not taking: Reported on 08/21/2020)    . cholestyramine (QUESTRAN) 4 GM/DOSE powder BEGIN WITH 1/4 TSP QOD. FOLLOW PROTOCOL (Patient not  taking: No sig reported)  1  . clotrimazole (LOTRIMIN) 1 % cream Apply to affected area 2 times daily for 2 weeks (Patient not taking: Reported on 08/21/2020) 15 g 0  . DULoxetine (CYMBALTA) 20 MG capsule Take 2 capsules (40 mg total) by mouth daily. 60 capsule 5  . ELDERBERRY PO Take by mouth. (Patient not taking: No sig reported)    . fluticasone (FLOVENT HFA) 110 MCG/ACT inhaler Inhale 2 puffs into the lungs 2 (two) times daily. (Patient not taking: No sig reported) 1 Inhaler 12  . hydrOXYzine (VISTARIL) 25 MG capsule Take 1 capsule (25 mg total) by mouth at bedtime as needed. (Patient not taking: No sig reported) 30 capsule 0  . irbesartan-hydrochlorothiazide (AVALIDE) 150-12.5 MG tablet Take 1 tablet by mouth in the morning and at bedtime. (Patient not taking: Reported on 08/21/2020) 180 tablet 3  . methylPREDNISolone (MEDROL) 4 MG tablet 5 tab po day 1, 4  tab po day 2, 3 tab po day 3,  2 tab po day 4, 1 tab po day 5. (Patient not taking: No sig reported) 15 tablet 0  . montelukast (SINGULAIR) 10 MG tablet Take 1 tablet (10 mg total) by mouth at bedtime. (Patient not taking: No sig reported) 30 tablet 0  . pantoprazole (PROTONIX) 40 MG tablet Take 1 tablet (40 mg total) by mouth daily. (Patient not taking: No sig reported) 90 tablet 3  . PREBIOTIC PRODUCT PO Take by mouth. Mix in shake and drink once a day. (Patient not taking: No sig reported)    . promethazine (PHENERGAN) 12.5 MG tablet Take 1 tablet (12.5 mg total) by mouth every 8 (eight) hours as needed for nausea or vomiting. (Patient not taking: No sig reported) 12 tablet 0   No current facility-administered medications for this visit.    Medication Side Effects: None  Allergies:  Allergies  Allergen Reactions  . Ceftriaxone Sodium In Dextrose Other (See Comments)    "heart races and cannot breathe"  . Guaifenesin & Derivatives Other (See Comments)    "heart races, dizziness, light-headedness"  . Meperidine Other (See Comments)    "heart races"  . Other Rash  . Penicillins Hives  . Pseudoephedrine Other (See Comments)    "heart races and high blood pressure"  . Rosuvastatin Calcium Nausea And Vomiting    Muscle aches   . Simvastatin Other (See Comments)    muscle aching   . Ciprofloxacin   . Rocephin [Ceftriaxone] Itching    Past Medical History:  Diagnosis Date  . Anxiety   . High cholesterol   . Hypertension   . Lyme disease   . Thyroid disease     Family History  Problem Relation Age of Onset  . Hypertension Mother   . High Cholesterol Mother   . Heart disease Father   . High Cholesterol Father   . Hypertension Sister   . High Cholesterol Sister   . Hypertension Brother   . High Cholesterol Brother   . Colon cancer Neg Hx     Social History   Socioeconomic History  . Marital status: Divorced    Spouse name: Not on file  . Number of children: Not on file  . Years of education: Not  on file  . Highest education level: Not on file  Occupational History  . Not on file  Tobacco Use  . Smoking status: Former Smoker    Quit date: 2006    Years since quitting: 16.1  . Smokeless tobacco: Never  Used  . Tobacco comment: socially   Vaping Use  . Vaping Use: Never used  Substance and Sexual Activity  . Alcohol use: No  . Drug use: No  . Sexual activity: Not Currently    Birth control/protection: Post-menopausal  Other Topics Concern  . Not on file  Social History Narrative  . Not on file   Social Determinants of Health   Financial Resource Strain: Not on file  Food Insecurity: Not on file  Transportation Needs: No Transportation Needs  . Lack of Transportation (Medical): No  . Lack of Transportation (Non-Medical): No  Physical Activity: Not on file  Stress: Not on file  Social Connections: Not on file  Intimate Partner Violence: Not on file    Past Medical History, Surgical history, Social history, and Family history were reviewed and updated as appropriate.   Please see review of systems for further details on the patient's review from today.   Objective:   Physical Exam:  There were no vitals taken for this visit.  Physical Exam Neurological:     Mental Status: She is alert and oriented to person, place, and time.     Cranial Nerves: No dysarthria.  Psychiatric:        Attention and Perception: She is inattentive.        Mood and Affect: Mood is anxious. Affect is not tearful.        Speech: Speech normal.        Behavior: Behavior is cooperative.        Thought Content: Thought content normal. Thought content is not paranoid or delusional. Thought content does not include homicidal or suicidal ideation. Thought content does not include homicidal or suicidal plan.        Cognition and Memory: Cognition normal.        Judgment: Judgment normal.     Comments: Insight fair.  Patient has chronically low stress tolerance. Complain of chronic  forgetfulness     Lab Review:     Component Value Date/Time   NA 138 04/05/2020 1052   K 4.3 04/05/2020 1052   CL 99 04/05/2020 1052   CO2 27 04/05/2020 1052   GLUCOSE 96 04/05/2020 1052   BUN 9 04/05/2020 1052   CREATININE 0.85 04/05/2020 1052   CALCIUM 9.5 04/05/2020 1052   PROT 6.5 04/05/2020 1052   ALBUMIN 4.1 12/11/2019 1428   AST 17 04/05/2020 1052   ALT 26 04/05/2020 1052   ALKPHOS 99 12/11/2019 1428   BILITOT 0.6 04/05/2020 1052   GFRNONAA >60 02/01/2020 1513   GFRAA >60 02/01/2020 1513       Component Value Date/Time   WBC 6.9 04/05/2020 1052   RBC 4.66 04/05/2020 1052   HGB 14.1 04/05/2020 1052   HCT 41.9 04/05/2020 1052   PLT 336 04/05/2020 1052   MCV 89.9 04/05/2020 1052   MCH 30.3 04/05/2020 1052   MCHC 33.7 04/05/2020 1052   RDW 12.8 04/05/2020 1052   LYMPHSABS 2,105 04/05/2020 1052   MONOABS 0.4 03/02/2017 1301   EOSABS 173 04/05/2020 1052   BASOSABS 41 04/05/2020 1052    No results found for: POCLITH, LITHIUM   No results found for: PHENYTOIN, PHENOBARB, VALPROATE, CBMZ   .res Assessment: Plan:   Generalized anxiety disorder, manageable  Attention deficit hyperactivity disorder improved with duloxetine  History of panic attacks in remission with current meds  Stop duloxetine BC of BP concerns.  Monitor and record BP and share with PCP.   Stay off for  a full month and if consistently more anxious then can add pure SSRI low dose Lexapro 5 mg for anxiety.  She failed attempted transition from alprazolam to lorazepam in October 2020.  It appears she had some withdrawal symptoms and she is gone back to alprazolam.  It was discussed with her that we could do the transition more gradually if she wanted to pursue that to see if she can get cognitive benefit with the switch.  She does not want to do that at this time. Continue Xanax 0.5 mg QID  We discussed the short-term risks associated with benzodiazepines including sedation and increased fall  risk among others.  Discussed long-term side effect risk including dependence, potential withdrawal symptoms, and the potential eventual dose-related risk of dementia.  But recent studies from 2020 dispute this association between benzodiazepines and dementia risk. Newer studies in 2020 do not support an association with dementia.   Has started counseling at [facility]  FU 6 mos  Meredith Staggers, MD, DFAPA   Please see After Visit Summary for patient specific instructions.  No future appointments.  No orders of the defined types were placed in this encounter.     -------------------------------

## 2020-08-31 ENCOUNTER — Other Ambulatory Visit: Payer: Self-pay | Admitting: Family Medicine

## 2020-09-02 ENCOUNTER — Telehealth: Payer: Self-pay

## 2020-09-02 MED ORDER — ARMOUR THYROID 15 MG PO TABS: 45.0000 mg | ORAL_TABLET | Freq: Every day | ORAL | 3 refills | Status: DC

## 2020-09-02 MED ORDER — IRBESARTAN 150 MG PO TABS: 150.0000 mg | ORAL_TABLET | Freq: Every day | ORAL | 1 refills | Status: DC

## 2020-09-02 NOTE — Telephone Encounter (Signed)
Spoke with patient. Pt would like regular Irbesartan refilled due to cost of the Irbesartan-hydrochlorothiazide. The regular irbesartan did not have a authorizing provider. Please advise.

## 2020-09-02 NOTE — Telephone Encounter (Signed)
Ok to fill D/c avapro hct

## 2020-09-02 NOTE — Addendum Note (Signed)
Addended by: Roxanne Gates on: 09/02/2020 04:29 PM   Modules accepted: Orders

## 2020-09-02 NOTE — Telephone Encounter (Signed)
Med sent to pharmacy.

## 2020-09-16 ENCOUNTER — Other Ambulatory Visit: Payer: Self-pay | Admitting: Psychiatry

## 2020-09-16 ENCOUNTER — Telehealth: Payer: Self-pay | Admitting: Psychiatry

## 2020-09-16 NOTE — Telephone Encounter (Signed)
Pt called and needs a refill on her hydroxyzine 25 mg to be sent to the [facility] on [address] in [place]

## 2020-09-16 NOTE — Telephone Encounter (Signed)
Refill sent.

## 2020-09-18 ENCOUNTER — Telehealth: Payer: Self-pay | Admitting: Family Medicine

## 2020-09-18 NOTE — Telephone Encounter (Signed)
Patient states her blood pressure has been elevated , patient would like some suggestions. Patient states her mother has been in hospital and they may be the cause of bp. Raises. She is stressed.   Blood pressure readings   154/105 130/90 148/98

## 2020-09-18 NOTE — Telephone Encounter (Signed)
Please advise 

## 2020-09-19 NOTE — Telephone Encounter (Signed)
She needs an ov ------- really watch her salt intake

## 2020-09-19 NOTE — Telephone Encounter (Signed)
Pt has been scheduled.  °

## 2020-09-23 ENCOUNTER — Encounter: Payer: Self-pay | Admitting: Family Medicine

## 2020-09-23 ENCOUNTER — Telehealth: Payer: Self-pay

## 2020-09-23 ENCOUNTER — Other Ambulatory Visit: Payer: Self-pay

## 2020-09-23 ENCOUNTER — Ambulatory Visit (INDEPENDENT_AMBULATORY_CARE_PROVIDER_SITE_OTHER): Payer: Self-pay | Admitting: Family Medicine

## 2020-09-23 VITALS — BP 142/78 | HR 59 | Temp 97.9°F | Resp 18 | Ht 66.0 in | Wt 216.8 lb

## 2020-09-23 DIAGNOSIS — E871 Hypo-osmolality and hyponatremia: Secondary | ICD-10-CM

## 2020-09-23 DIAGNOSIS — I10 Essential (primary) hypertension: Secondary | ICD-10-CM

## 2020-09-23 MED ORDER — IRBESARTAN 300 MG PO TABS: 300.0000 mg | ORAL_TABLET | Freq: Every day | ORAL | 2 refills | Status: DC

## 2020-09-23 NOTE — Telephone Encounter (Signed)
Nurse Assessment Nurse: Noe Gens, RN, Archie Patten Date/Time (Eastern Time): 09/20/2020 9:41:13 PM Confirm and document reason for call. If symptomatic, describe symptoms. ---Caller States her blood pressure has been elevated right now running around 146/101 about 10 min ago. States she also has a headache as well. States she is only supposed to take one of her meds in the morning and one at night, but she had to take two tonight because of this. Denies any other issues at this time. Does the patient have any new or worsening symptoms? ---Yes Will a triage be completed? ---Yes Related visit to physician within the last 2 weeks? ---No Does the PT have any chronic conditions? (i.e. diabetes, asthma, this includes High risk factors for pregnancy, etc.) ---Yes List chronic conditions. ---HTN, Lyme Disease, Tachycardia Is this a behavioral health or substance abuse call? ---No Guidelines Guideline Title Affirmed Question Affirmed Notes Nurse Date/Time (Eastern Time) Blood Pressure - High [1] Systolic BP >= 160 OR Diastolic >= 100 AND [2] cardiac or neurologic symptoms (e.g., chest pain, difficulty Tomasa Rand 09/20/2020 9:43:37 PM PLEASE NOTE: All timestamps contained within this report are represented as [place] Standard Time. CONFIDENTIALTY NOTICE: This fax transmission is intended only for the addressee. It contains information that is legally privileged, confidential or otherwise protected from use or disclosure. If you are not the intended recipient, you are strictly prohibited from reviewing, disclosing, copying using or disseminating any of this information or taking any action in reliance on or regarding this information. If you have received this fax in error, please notify us immediately by telephone so that we can arrange for its return to [place]. Phone: [phone], Toll-Free: [phone], Fax: [phone] Page: 2 of 2 Call Id: [id number] Guidelines Guideline Title Affirmed Question  Affirmed Notes Nurse Date/Time [name] breathing, unsteady gait, blurred vision) Disp. Time [name] Disposition Final User 09/20/2020 9:46:22 PM Go to ED Now Yes Noe Gens, RN, Wannetta Sender Disagree/Comply Comply Caller Understands Yes PreDisposition Go to ED Care Advice Given Per Guideline GO TO ED NOW: * You need to be seen in the Emergency Department. * Go to the ED at ___________ Hospital. * Leave now. Drive carefully. CALL EMS 911 IF: * Passes out or faints * Becomes confused * Becomes too weak to stand * You become worse CARE ADVICE given per High Blood Pressure (Adult) guideline. Referrals [facility] [place] [facility] - ED

## 2020-09-23 NOTE — Assessment & Plan Note (Signed)
Recheck labs 

## 2020-09-23 NOTE — Assessment & Plan Note (Signed)
Poorly controlled will alter medications, encouraged DASH diet, minimize caffeine and obtain adequate sleep. Report concerning symptoms and follow up as directed and as needed 

## 2020-09-23 NOTE — Patient Instructions (Signed)
https://www.nhlbi.nih.gov/files/docs/public/heart/dash_brief.pdf">  °DASH Eating Plan °DASH stands for Dietary Approaches to Stop Hypertension. The DASH eating plan is a healthy eating plan that has been shown to: °· Reduce high blood pressure (hypertension). °· Reduce your risk for type 2 diabetes, heart disease, and stroke. °· Help with weight loss. °What are tips for following this plan? °Reading food labels °· Check food labels for the amount of salt (sodium) per serving. Choose foods with less than 5 percent of the Daily Value of sodium. Generally, foods with less than 300 milligrams (mg) of sodium per serving fit into this eating plan. °· To find whole grains, look for the word "whole" as the first word in the ingredient list. °Shopping °· Buy products labeled as "low-sodium" or "no salt added." °· Buy fresh foods. Avoid canned foods and pre-made or frozen meals. °Cooking °· Avoid adding salt when cooking. Use salt-free seasonings or herbs instead of table salt or sea salt. Check with your health care provider or pharmacist before using salt substitutes. °· Do not fry foods. Cook foods using healthy methods such as baking, boiling, grilling, roasting, and broiling instead. °· Cook with heart-healthy oils, such as olive, canola, avocado, soybean, or sunflower oil. °Meal planning °· Eat a balanced diet that includes: °? 4 or more servings of fruits and 4 or more servings of vegetables each day. Try to fill one-half of your plate with fruits and vegetables. °? 6-8 servings of whole grains each day. °? Less than 6 oz (170 g) of lean meat, poultry, or fish each day. A 3-oz (85-g) serving of meat is about the same size as a deck of cards. One egg equals 1 oz (28 g). °? 2-3 servings of low-fat dairy each day. One serving is 1 cup (237 mL). °? 1 serving of nuts, seeds, or beans 5 times each week. °? 2-3 servings of heart-healthy fats. Healthy fats called omega-3 fatty acids are found in foods such as walnuts,  flaxseeds, fortified milks, and eggs. These fats are also found in cold-water fish, such as sardines, salmon, and mackerel. °· Limit how much you eat of: °? Canned or prepackaged foods. °? Food that is high in trans fat, such as some fried foods. °? Food that is high in saturated fat, such as fatty meat. °? Desserts and other sweets, sugary drinks, and other foods with added sugar. °? Full-fat dairy products. °· Do not salt foods before eating. °· Do not eat more than 4 egg yolks a week. °· Try to eat at least 2 vegetarian meals a week. °· Eat more home-cooked food and less restaurant, buffet, and fast food.   °Lifestyle °· When eating at a restaurant, ask that your food be prepared with less salt or no salt, if possible. °· If you drink alcohol: °? Limit how much you use to: °§ 0-1 drink a day for women who are not pregnant. °§ 0-2 drinks a day for men. °? Be aware of how much alcohol is in your drink. In the [place] one drink equals one 12 oz bottle of beer (355 mL), one 5 oz glass of wine (148 mL), or one 1½ oz glass of hard liquor (44 mL). °General information °· Avoid eating more than 2,300 mg of salt a day. If you have hypertension, you may need to reduce your sodium intake to 1,500 mg a day. °· Work with your health care provider to maintain a healthy body weight or to lose weight. Ask what an ideal weight is for   you. °· Get at least 30 minutes of exercise that causes your heart to beat faster (aerobic exercise) most days of the week. Activities may include walking, swimming, or biking. °· Work with your health care provider or dietitian to adjust your eating plan to your individual calorie needs. °What foods should I eat? °Fruits °All fresh, dried, or frozen fruit. Canned fruit in natural juice (without added sugar). °Vegetables °Fresh or frozen vegetables (raw, steamed, roasted, or grilled). Low-sodium or reduced-sodium tomato and vegetable juice. Low-sodium or reduced-sodium tomato sauce and tomato paste.  Low-sodium or reduced-sodium canned vegetables. °Grains °Whole-grain or whole-wheat bread. Whole-grain or whole-wheat pasta. Brown rice. Oatmeal. Quinoa. Bulgur. Whole-grain and low-sodium cereals. Pita bread. Low-fat, low-sodium crackers. Whole-wheat flour tortillas. °Meats and other proteins °Skinless chicken or turkey. Ground chicken or turkey. Pork with fat trimmed off. Fish and seafood. Egg whites. Dried beans, peas, or lentils. Unsalted nuts, nut butters, and seeds. Unsalted canned beans. Lean cuts of beef with fat trimmed off. Low-sodium, lean precooked or cured meat, such as sausages or meat loaves. °Dairy °Low-fat (1%) or fat-free (skim) milk. Reduced-fat, low-fat, or fat-free cheeses. Nonfat, low-sodium ricotta or cottage cheese. Low-fat or nonfat yogurt. Low-fat, low-sodium cheese. °Fats and oils °Soft margarine without trans fats. Vegetable oil. Reduced-fat, low-fat, or light mayonnaise and salad dressings (reduced-sodium). Canola, safflower, olive, avocado, soybean, and sunflower oils. Avocado. °Seasonings and condiments °Herbs. Spices. Seasoning mixes without salt. °Other foods °Unsalted popcorn and pretzels. Fat-free sweets. °The items listed above may not be a complete list of foods and beverages you can eat. Contact a dietitian for more information. °What foods should I avoid? °Fruits °Canned fruit in a light or heavy syrup. Fried fruit. Fruit in cream or butter sauce. °Vegetables °Creamed or fried vegetables. Vegetables in a cheese sauce. Regular canned vegetables (not low-sodium or reduced-sodium). Regular canned tomato sauce and paste (not low-sodium or reduced-sodium). Regular tomato and vegetable juice (not low-sodium or reduced-sodium). Pickles. Olives. °Grains °Baked goods made with fat, such as croissants, muffins, or some breads. Dry pasta or rice meal packs. °Meats and other proteins °Fatty cuts of meat. Ribs. Fried meat. Bacon. Bologna, salami, and other precooked or cured meats, such as  sausages or meat loaves. Fat from the back of a pig (fatback). Bratwurst. Salted nuts and seeds. Canned beans with added salt. Canned or smoked fish. Whole eggs or egg yolks. Chicken or turkey with skin. °Dairy °Whole or 2% milk, cream, and half-and-half. Whole or full-fat cream cheese. Whole-fat or sweetened yogurt. Full-fat cheese. Nondairy creamers. Whipped toppings. Processed cheese and cheese spreads. °Fats and oils °Butter. Stick margarine. Lard. Shortening. Ghee. Bacon fat. Tropical oils, such as coconut, palm kernel, or palm oil. °Seasonings and condiments °Onion salt, garlic salt, seasoned salt, table salt, and sea salt. Worcestershire sauce. Tartar sauce. Barbecue sauce. Teriyaki sauce. Soy sauce, including reduced-sodium. Steak sauce. Canned and packaged gravies. Fish sauce. Oyster sauce. Cocktail sauce. Store-bought horseradish. Ketchup. Mustard. Meat flavorings and tenderizers. Bouillon cubes. Hot sauces. Pre-made or packaged marinades. Pre-made or packaged taco seasonings. Relishes. Regular salad dressings. °Other foods °Salted popcorn and pretzels. °The items listed above may not be a complete list of foods and beverages you should avoid. Contact a dietitian for more information. °Where to find more information °· National Heart, Lung, and Blood Institute: www.nhlbi.nih.gov °· American Heart Association: www.heart.org °· Academy of Nutrition and Dietetics: www.eatright.org °· National Kidney Foundation: www.kidney.org °Summary °· The DASH eating plan is a healthy eating plan that has been shown to   reduce high blood pressure (hypertension). It may also reduce your risk for type 2 diabetes, heart disease, and stroke. °· When on the DASH eating plan, aim to eat more fresh fruits and vegetables, whole grains, lean proteins, low-fat dairy, and heart-healthy fats. °· With the DASH eating plan, you should limit salt (sodium) intake to 2,300 mg a day. If you have hypertension, you may need to reduce your  sodium intake to 1,500 mg a day. °· Work with your health care provider or dietitian to adjust your eating plan to your individual calorie needs. °This information is not intended to replace advice given to you by your health care provider. Make sure you discuss any questions you have with your health care provider. °Document Revised: 05/26/2019 Document Reviewed: 05/26/2019 °Elsevier Patient Education © 2021 [employer] Inc. ° °

## 2020-09-23 NOTE — Telephone Encounter (Signed)
Pt scheduled for this afternoon.

## 2020-09-23 NOTE — Telephone Encounter (Signed)
Caller has a headache and high blood pressure. Caller does not want to speak with afterhours RN's. Caller is wondering if they need a medication change

## 2020-09-23 NOTE — Progress Notes (Signed)
Patient ID: Jenna Arroyo, female    DOB: [birth year]  Age: 55 y.o. MRN: [id number]    Subjective:  Subjective  HPI Jenna Arroyo presents for an office visit today.  She complains of high blood pressure. The patient has a PMHx of HTN. She has not been taking her at home BP consistently. Her last at home BP was 155-135/106-100. She was recently seen in the ED on 09/20/2020 for HTN. BP Readings from Last 3 Encounters:  09/23/20 (!) 142/78  06/18/20 132/80  06/14/20 110/80   She also reports her sodium levels are low, however she attributes it to her drinking water. She denies any chest pain, SOB, fever, abdominal pain, cough, chills, sore throat, dysuria, urinary incontinence, back pain, HA, or N/VD at this time.    Review of Systems  Constitutional: Negative for chills, fatigue and fever.  HENT: Negative for congestion, ear pain, sinus pain and sore throat.   Eyes: Negative for pain.  Respiratory: Negative for cough and shortness of breath.   Cardiovascular: Negative for chest pain, palpitations and leg swelling.  Gastrointestinal: Negative for abdominal pain, blood in stool, constipation, diarrhea, nausea and vomiting.  Genitourinary: Negative for dysuria, frequency, hematuria and urgency.  Musculoskeletal: Negative for back pain and myalgias.  Skin: Negative for rash.  Neurological: Negative for headaches.    History Past Medical History:  Diagnosis Date  . Anxiety   . High cholesterol   . Hypertension   . Lyme disease   . Thyroid disease     She has a past surgical history that includes Tonsilectomy/adenoidectomy with myringotomy; Mastoidectomy; Appendectomy; and Abdominal exploration surgery.   Her family history includes Heart disease in her father; High Cholesterol in her brother, father, mother, and sister; Hypertension in her brother, mother, and sister.She reports that she quit smoking about 16 years ago. She has never used smokeless tobacco. She reports that she does not  drink alcohol and does not use drugs.  Current Outpatient Medications on File Prior to Visit  Medication Sig Dispense Refill  . albuterol (PROVENTIL HFA;VENTOLIN HFA) 108 (90 Base) MCG/ACT inhaler Inhale 1-2 puffs into the lungs every 6 (six) hours as needed. 1 Inhaler 0  . ALPRAZolam (XANAX) 0.5 MG tablet TAKE ONE TABLET BY MOUTH FOUR TIMES A DAY (ONE TABLET EVERY MORNING, ONE TABLET AT NOON, ONE TABLET IN THE EVENING, AND ONE TABLET AT BEDTIME) 120 tablet 5  . ARMOUR THYROID 15 MG tablet Take 3 tablets (45 mg total) by mouth daily. 90 tablet 3  . atenolol (TENORMIN) 25 MG tablet Take 1 tablet by mouth twice daily 60 tablet 3  . azelastine (ASTELIN) 0.1 % nasal spray Place 2 sprays into both nostrils 2 (two) times daily. Use in each nostril as directed 30 mL 1  . Charcoal 200 MG CAPS Take by mouth every other day.    . charcoal activated, NO SORBITOL, (ACTIDOSE-AQUA) suspension Take 50 g by mouth once. 200 mg    . Cholecalciferol (VITAMIN D) 10 MCG/ML LIQD Take by mouth.    . cholestyramine (QUESTRAN) 4 GM/DOSE powder BEGIN WITH 1/4 TSP QOD. FOLLOW PROTOCOL  1  . clotrimazole (LOTRIMIN) 1 % cream Apply to affected area 2 times daily for 2 weeks 15 g 0  . ELDERBERRY PO Take by mouth.    . fluticasone (FLOVENT HFA) 110 MCG/ACT inhaler Inhale 2 puffs into the lungs 2 (two) times daily. 1 Inhaler 12  . hydrOXYzine (VISTARIL) 25 MG capsule TAKE ONE CAPSULE BY MOUTH ONE  TIME DAILY AT BEDTIME AS NEEDED 30 capsule 5  . methylPREDNISolone (MEDROL) 4 MG tablet 5 tab po day 1, 4  tab po day 2, 3 tab po day 3, 2 tab po day 4, 1 tab po day 5. (Patient taking differently: 5 tab po day 1, 4  tab po day 2, 3 tab po day 3, 2 tab po day 4, 1 tab po day 5.) 15 tablet 0  . montelukast (SINGULAIR) 10 MG tablet Take 1 tablet (10 mg total) by mouth at bedtime. 30 tablet 0  . pantoprazole (PROTONIX) 40 MG tablet Take 1 tablet (40 mg total) by mouth daily. 90 tablet 3  . PREBIOTIC PRODUCT PO Take by mouth. Mix in shake  and drink once a day.    . promethazine (PHENERGAN) 12.5 MG tablet Take 1 tablet (12.5 mg total) by mouth every 8 (eight) hours as needed for nausea or vomiting. 12 tablet 0  . escitalopram (LEXAPRO) 10 MG tablet Take by mouth.    . hydrochlorothiazide (MICROZIDE) 12.5 MG capsule Take 12.5 mg by mouth daily.     No current facility-administered medications on file prior to visit.     Objective:  Objective  Physical Exam Vitals and nursing note reviewed.  Constitutional:      General: She is not in acute distress.    Appearance: Normal appearance. She is well-developed. She is not ill-appearing.  HENT:     Head: Normocephalic and atraumatic.     Right Ear: External ear normal.     Left Ear: External ear normal.     Nose: Nose normal.  Eyes:     Extraocular Movements: Extraocular movements intact.     Pupils: Pupils are equal, round, and reactive to light.  Cardiovascular:     Rate and Rhythm: Normal rate and regular rhythm.     Pulses: Normal pulses.     Heart sounds: Normal heart sounds. No murmur heard.   Pulmonary:     Effort: Pulmonary effort is normal. No respiratory distress.     Breath sounds: Normal breath sounds. No wheezing or rales.  Chest:     Chest wall: No tenderness.  Abdominal:     General: Bowel sounds are normal. There is no distension.     Palpations: Abdomen is soft. There is no mass.     Tenderness: There is no abdominal tenderness. There is no guarding.     Hernia: No hernia is present.  Musculoskeletal:        General: Normal range of motion.     Cervical back: Normal range of motion and neck supple.  Skin:    General: Skin is dry.  Neurological:     Mental Status: She is alert and oriented to person, place, and time.  Psychiatric:        Behavior: Behavior normal.    BP (!) 142/78 (BP Location: Left Arm, Patient Position: Sitting, Cuff Size: Large)   Pulse (!) 59   Temp 97.9 F (36.6 C) (Oral)   Resp 18   Ht 5\' 6"  (1.676 m)   Wt 216 lb  12.8 oz (98.3 kg)   SpO2 100%   BMI 34.99 kg/m  Wt Readings from Last 3 Encounters:  09/23/20 216 lb 12.8 oz (98.3 kg)  06/14/20 224 lb 3.2 oz (101.7 kg)  04/05/20 220 lb 9.6 oz (100.1 kg)     Lab Results  Component Value Date   WBC 6.9 04/05/2020   HGB 14.1 04/05/2020   HCT 41.9  04/05/2020   PLT 336 04/05/2020   GLUCOSE 96 04/05/2020   CHOL 280 (H) 01/17/2018   TRIG 189.0 (H) 01/17/2018   HDL 64.70 01/17/2018   LDLCALC 178 (H) 01/17/2018   ALT 26 04/05/2020   AST 17 04/05/2020   NA 138 04/05/2020   K 4.3 04/05/2020   CL 99 04/05/2020   CREATININE 0.85 04/05/2020   BUN 9 04/05/2020   CO2 27 04/05/2020   TSH 2.09 06/14/2020   MICROALBUR <0.7 12/11/2019    [place] AXILLA LEFT  Result Date: 06/19/2020 CLINICAL DATA:  55 year old with chronic Lyme disease, presenting with diffuse BILATERAL breast pain and possible palpable lumps in both axilla. EXAM: DIGITAL DIAGNOSTIC BILATERAL MAMMOGRAM WITH CAD AND TOMO ULTRASOUND BILATERAL AXILLAE COMPARISON:  Previous exam(s). ACR Breast Density Category b: There are scattered areas of fibroglandular density. FINDINGS: Tomosynthesis and synthesized full field CC and MLO views of both breasts were obtained. Tomosynthesis and synthesized spot tangential views of the areas of palpable concern in both axillae were also obtained. Mammographic images were processed with CAD. RIGHT: Asymmetric focally dense normal fibroglandular tissue in the UPPER breast at POSTERIOR depth, unchanged since the 2019 mammogram, confirming benignity. No visible mass or lymphadenopathy in the RIGHT axilla. No findings suspicious for malignancy. Targeted RIGHT axillary ultrasound is performed, showing normal fibrofatty tissue. There is no pathologic lymphadenopathy. The triceps muscle is present in the area of palpable concern. LEFT: No findings suspicious for malignancy. No visible mass or lymphadenopathy in the LEFT axilla. Targeted LEFT axillary ultrasound is performed,  showing normal fibrofatty tissue. There is no pathologic lymphadenopathy. Again, the triceps muscle is present in the area of palpable concern. On correlative physical exam, there is palpable tissue in the upper axilla bilaterally, and on ultrasound, these palpable tissues correspond to the triceps muscle. IMPRESSION: 1. No mammographic evidence of malignancy involving either breast. 2. No pathologic lymphadenopathy involving either axilla. RECOMMENDATION: Screening mammogram in one year.(Code:SM-B-01Y) I have discussed the findings and recommendations with the patient. If applicable, a reminder letter will be sent to the patient regarding the next appointment. BI-RADS CATEGORY  1: Negative. Electronically Signed   By: Hulan Saas M.D.   On: 06/19/2020 16:27   [place] AXILLA RIGHT  Result Date: 06/19/2020 CLINICAL DATA:  55 year old with chronic Lyme disease, presenting with diffuse BILATERAL breast pain and possible palpable lumps in both axilla. EXAM: DIGITAL DIAGNOSTIC BILATERAL MAMMOGRAM WITH CAD AND TOMO ULTRASOUND BILATERAL AXILLAE COMPARISON:  Previous exam(s). ACR Breast Density Category b: There are scattered areas of fibroglandular density. FINDINGS: Tomosynthesis and synthesized full field CC and MLO views of both breasts were obtained. Tomosynthesis and synthesized spot tangential views of the areas of palpable concern in both axillae were also obtained. Mammographic images were processed with CAD. RIGHT: Asymmetric focally dense normal fibroglandular tissue in the UPPER breast at POSTERIOR depth, unchanged since the 2019 mammogram, confirming benignity. No visible mass or lymphadenopathy in the RIGHT axilla. No findings suspicious for malignancy. Targeted RIGHT axillary ultrasound is performed, showing normal fibrofatty tissue. There is no pathologic lymphadenopathy. The triceps muscle is present in the area of palpable concern. LEFT: No findings suspicious for malignancy. No visible mass or  lymphadenopathy in the LEFT axilla. Targeted LEFT axillary ultrasound is performed, showing normal fibrofatty tissue. There is no pathologic lymphadenopathy. Again, the triceps muscle is present in the area of palpable concern. On correlative physical exam, there is palpable tissue in the upper axilla bilaterally, and on ultrasound, these palpable tissues correspond to the  triceps muscle. IMPRESSION: 1. No mammographic evidence of malignancy involving either breast. 2. No pathologic lymphadenopathy involving either axilla. RECOMMENDATION: Screening mammogram in one year.(Code:SM-[id number]) I have discussed the findings and recommendations with the patient. If applicable, a reminder letter will be sent to the patient regarding the next appointment. BI-RADS CATEGORY  1: Negative. Electronically Signed   By: Hulan Saas M.D.   On: 06/19/2020 16:27   MS DIGITAL DIAG TOMO BILAT  Result Date: 06/19/2020 CLINICAL DATA:  55 year old with chronic Lyme disease, presenting with diffuse BILATERAL breast pain and possible palpable lumps in both axilla. EXAM: DIGITAL DIAGNOSTIC BILATERAL MAMMOGRAM WITH CAD AND TOMO ULTRASOUND BILATERAL AXILLAE COMPARISON:  Previous exam(s). ACR Breast Density Category b: There are scattered areas of fibroglandular density. FINDINGS: Tomosynthesis and synthesized full field CC and MLO views of both breasts were obtained. Tomosynthesis and synthesized spot tangential views of the areas of palpable concern in both axillae were also obtained. Mammographic images were processed with CAD. RIGHT: Asymmetric focally dense normal fibroglandular tissue in the UPPER breast at POSTERIOR depth, unchanged since the 2019 mammogram, confirming benignity. No visible mass or lymphadenopathy in the RIGHT axilla. No findings suspicious for malignancy. Targeted RIGHT axillary ultrasound is performed, showing normal fibrofatty tissue. There is no pathologic lymphadenopathy. The triceps muscle is present in the  area of palpable concern. LEFT: No findings suspicious for malignancy. No visible mass or lymphadenopathy in the LEFT axilla. Targeted LEFT axillary ultrasound is performed, showing normal fibrofatty tissue. There is no pathologic lymphadenopathy. Again, the triceps muscle is present in the area of palpable concern. On correlative physical exam, there is palpable tissue in the upper axilla bilaterally, and on ultrasound, these palpable tissues correspond to the triceps muscle. IMPRESSION: 1. No mammographic evidence of malignancy involving either breast. 2. No pathologic lymphadenopathy involving either axilla. RECOMMENDATION: Screening mammogram in one year.(Code:[id number]) I have discussed the findings and recommendations with the patient. If applicable, a reminder letter will be sent to the patient regarding the next appointment. BI-RADS CATEGORY  1: Negative. Electronically Signed   By: Hulan Saas M.D.   On: 06/19/2020 16:27     Assessment & Plan:  Plan    Meds ordered this encounter  Medications  . irbesartan (AVAPRO) 300 MG tablet    Sig: Take 1 tablet (300 mg total) by mouth daily.    Dispense:  30 tablet    Refill:  2    Problem List Items Addressed This Visit      Unprioritized   Essential hypertension - Primary    Poorly controlled will alter medications, encouraged DASH diet, minimize caffeine and obtain adequate sleep. Report concerning symptoms and follow up as directed and as needed      Relevant Medications   hydrochlorothiazide (MICROZIDE) 12.5 MG capsule   irbesartan (AVAPRO) 300 MG tablet   Hyponatremia    Recheck labs       Relevant Orders   Comprehensive metabolic panel      Follow-up: Return in about 3 weeks (around 10/14/2020), or if symptoms worsen or fail to improve, for hypertension.   I,Gordon Zheng,acting as a Neurosurgeon for [employer], DO.,have documented all relevant documentation on the behalf of Donato Schultz, DO,as directed by   Donato Schultz, DO while in the presence of Donato Schultz, DO.  I, Donato Schultz, DO, have reviewed all documentation for this visit. The documentation on 09/23/20 for the exam, diagnosis, procedures, and orders are all  accurate and complete. Donato SchultzYvonne R Lowne Chase, DO

## 2020-09-24 ENCOUNTER — Telehealth: Payer: Self-pay | Admitting: Family Medicine

## 2020-09-24 LAB — COMPREHENSIVE METABOLIC PANEL
ALT: 16 U/L (ref 0–35)
AST: 16 U/L (ref 0–37)
Albumin: 4.6 g/dL (ref 3.5–5.2)
Alkaline Phosphatase: 101 U/L (ref 39–117)
BUN: 15 mg/dL (ref 6–23)
CO2: 30 mEq/L (ref 19–32)
Calcium: 9.7 mg/dL (ref 8.4–10.5)
Chloride: 93 mEq/L — ABNORMAL LOW (ref 96–112)
Creatinine, Ser: 0.92 mg/dL (ref 0.40–1.20)
GFR: 70.29 mL/min (ref >60.00)
Glucose, Bld: 94 mg/dL (ref 70–99)
Potassium: 4.2 mEq/L (ref 3.5–5.1)
Sodium: 133 mEq/L — ABNORMAL LOW (ref 135–145)
Total Bilirubin: 0.6 mg/dL (ref 0.2–1.2)
Total Protein: 7.2 g/dL (ref 6.0–8.3)

## 2020-09-24 NOTE — Telephone Encounter (Signed)
Patient states her blood pressure this morning was 111/82 pulse 76. She is wondering if she should take irbesartan. She is afraid her blood pressure will drop

## 2020-09-24 NOTE — Telephone Encounter (Signed)
Please advise 

## 2020-09-24 NOTE — Telephone Encounter (Signed)
Did she take the second pill last night ---- ?  That coul d be why --- start in am

## 2020-09-25 NOTE — Telephone Encounter (Signed)
Pt states that she would like to know if she should still be taking both pills when her blood pressure is low.

## 2020-09-26 NOTE — Telephone Encounter (Signed)
I told her to take 2 of her 150 mg at one time to use them up but she should only take 1 tab of the 300 mg daily

## 2020-09-26 NOTE — Telephone Encounter (Signed)
Should just be the avapro she is taking  She can hold the hctz and only take when needed for swelling

## 2020-09-26 NOTE — Telephone Encounter (Signed)
Pt would like clarification she states that you told her to take 1 tablet in the morning and evening of the avapro. Is this how she is suppose to take it? The direction say 1 tablet by mouth daily.

## 2020-09-26 NOTE — Telephone Encounter (Signed)
Pt called. LDVM 

## 2020-09-27 ENCOUNTER — Telehealth: Payer: Self-pay

## 2020-09-27 NOTE — Telephone Encounter (Signed)
Nurse Assessment Nurse: Vaughan Browner, RN, Marchelle Folks Date/Time (Eastern Time): 09/26/2020 11:54:01 AM Confirm and document reason for call. If symptomatic, describe symptoms. ---caller states her blood pressure was 128/88 and then went to 133/88 and she is taking her medication but today did not take the full dose today. felt a little dizzy about 45 mins ago. she took another half of the med Does the patient have any new or worsening symptoms? ---Yes Will a triage be completed? ---Yes Related visit to physician within the last 2 weeks? ---Yes Does the PT have any chronic conditions? (i.e. diabetes, asthma, this includes High risk factors for pregnancy, etc.) ---Yes List chronic conditions. ---HTN, tachycardia Is this a behavioral health or substance abuse call? ---No Guidelines Guideline Title Affirmed Question Affirmed Notes Nurse Date/Time (Eastern Time) Blood Pressure - High [1] Taking BP medications AND [2] feels is having side effects (e.g., impotence, cough, dizzy upon standing) Hum[place]et, RN, Marchelle Folks 09/26/2020 11:56:25 AM Disp. Time [name] Disposition Final User 09/26/2020 11:41:11 AM Attempt made - no message left Hum[place]et, RN, Marchelle Folks 09/26/2020 11:59:18 AM SEE PCP WITHIN 3 DAYS Yes Humfleet, RN, Marchelle Folks PLEASE NOTE: All timestamps contained within this report are represented as [place] Standard Time. CONFIDENTIALTY NOTICE: This fax transmission is intended only for the addressee. It contains information that is legally privileged, confidential or otherwise protected from use or disclosure. If you are not the intended recipient, you are strictly prohibited from reviewing, disclosing, copying using or disseminating any of this information or taking any action in reliance on or regarding this information. If you have received this fax in error, please notify us immediately by telephone so that we can arrange for its return to [place]. Phone: [phone], Toll-Free: [phone], Fax:  [phone] Page: 2 of 2 Call Id: [id number] Caller Disagree/Comply Comply Caller Understands Yes PreDisposition Call Doctor Care Advice Given Per Guideline SEE PCP WITHIN 3 DAYS: * You need to be seen within 2 or 3 days. * PCP VISIT: Call your doctor (or NP/PA) during regular office hours and make an appointment. A clinic or urgent care center are good places to go for care if your doctor's office is closed or you can't get an appointment. NOTE: If office will be open tomorrow, tell caller to call then, not in 3 days. CARE ADVICE given per High Blood Pressure (Adult) guideline. * You become worse CALL BACK IF: * Weakness or numbness of the face, arm or leg on one side of the body occurs * Difficulty walking, difficulty talking, or severe headache occurs * Chest pain or difficulty breathing occurs Referrals REFERRED TO PCP OFFICE

## 2020-09-27 NOTE — Telephone Encounter (Signed)
She needs to make sure she is taking 300 mg 1 x a day

## 2020-09-27 NOTE — Telephone Encounter (Signed)
After hours call:  Pt said her BP was 128/88 and then went up to 133/88 and she is taking her medication but yesterday (day of the call) did not take the full dose today. She did feel a little dizzy and she took another half of the med.

## 2020-09-27 NOTE — Telephone Encounter (Signed)
Left a detailed message about medication clarification.

## 2020-10-15 ENCOUNTER — Ambulatory Visit (INDEPENDENT_AMBULATORY_CARE_PROVIDER_SITE_OTHER): Payer: Self-pay | Admitting: Family Medicine

## 2020-10-15 ENCOUNTER — Other Ambulatory Visit: Payer: Self-pay

## 2020-10-15 ENCOUNTER — Encounter: Payer: Self-pay | Admitting: Family Medicine

## 2020-10-15 VITALS — BP 122/70 | HR 69 | Temp 98.3°F | Resp 18 | Ht 66.0 in | Wt 218.0 lb

## 2020-10-15 DIAGNOSIS — E039 Hypothyroidism, unspecified: Secondary | ICD-10-CM

## 2020-10-15 DIAGNOSIS — I10 Essential (primary) hypertension: Secondary | ICD-10-CM

## 2020-10-15 DIAGNOSIS — E871 Hypo-osmolality and hyponatremia: Secondary | ICD-10-CM

## 2020-10-15 NOTE — Progress Notes (Signed)
Subjective:   By signing my name below, I, Jenna Arroyo, attest that this documentation has been prepared under the direction and in the presence of Dr. Seabron Arroyo, Jenna Arroyo 10/15/2020     Patient ID: Jenna Arroyo, female    DOB: 12/21/1965, 55 y.o.   MRN: [id number]  Chief Complaint  Patient presents with  . Follow-up    3 week: HTN Concerns/questions: additional labs for endo -tlt,rma    HPI Patient is in today for an office visit. She is here for routine labs, she reports drinking coconut water prior to coming here. She has no request for a refill on her medications at this time. She reports having fluid "pocket" in her lower extremities. She has gained 3 lbs and is concerned about this. She denies any shortness of breath, chest pain, tightness, or pressure, nausea, vomiting, or diarrhea.  Filed Weights   10/15/20 1538  Weight: 218 lb (98.9 kg)     Past Medical History:  Diagnosis Date  . Anxiety   . High cholesterol   . Hypertension   . Lyme disease   . Thyroid disease     Past Surgical History:  Procedure Laterality Date  . ABDOMINAL EXPLORATION SURGERY    . APPENDECTOMY    . MASTOIDECTOMY    . TONSILECTOMY/ADENOIDECTOMY WITH MYRINGOTOMY      Family History  Problem Relation Age of Onset  . Hypertension Mother   . High Cholesterol Mother   . Heart disease Father   . High Cholesterol Father   . Hypertension Sister   . High Cholesterol Sister   . Hypertension Brother   . High Cholesterol Brother   . Colon cancer Neg Hx     Social History   Socioeconomic History  . Marital status: Divorced    Spouse name: Not on file  . Number of children: Not on file  . Years of education: Not on file  . Highest education level: Not on file  Occupational History  . Not on file  Tobacco Use  . Smoking status: Former Smoker    Quit date: 2006    Years since quitting: 16.2  . Smokeless tobacco: Never Used  . Tobacco comment: socially   Vaping Use  . Vaping  Use: Never used  Substance and Sexual Activity  . Alcohol use: No  . Drug use: No  . Sexual activity: Not Currently    Birth control/protection: Post-menopausal  Other Topics Concern  . Not on file  Social History Narrative  . Not on file   Social Determinants of Health   Financial Resource Strain: Not on file  Food Insecurity: Not on file  Transportation Needs: No Transportation Needs  . Lack of Transportation (Medical): No  . Lack of Transportation (Non-Medical): No  Physical Activity: Not on file  Stress: Not on file  Social Connections: Not on file  Intimate Partner Violence: Not on file    Outpatient Medications Prior to Visit  Medication Sig Dispense Refill  . albuterol (PROVENTIL HFA;VENTOLIN HFA) 108 (90 Base) MCG/ACT inhaler Inhale 1-2 puffs into the lungs every 6 (six) hours as needed. 1 Inhaler 0  . ALPRAZolam (XANAX) 0.5 MG tablet TAKE ONE TABLET BY MOUTH FOUR TIMES A DAY (ONE TABLET EVERY MORNING, ONE TABLET AT NOON, ONE TABLET IN THE EVENING, AND ONE TABLET AT BEDTIME) 120 tablet 5  . ARMOUR THYROID 15 MG tablet Take 3 tablets (45 mg total) by mouth daily. 90 tablet 3  . atenolol (TENORMIN) 25  MG tablet Take 1 tablet by mouth twice daily 60 tablet 3  . azelastine (ASTELIN) 0.1 % nasal spray Place 2 sprays into both nostrils 2 (two) times daily. Use in each nostril as directed 30 mL 1  . Charcoal 200 MG CAPS Take by mouth every other day.    . charcoal activated, NO SORBITOL, (ACTIDOSE-AQUA) suspension Take 50 g by mouth once. 200 mg    . Cholecalciferol (VITAMIN D) 10 MCG/ML LIQD Take by mouth.    . cholestyramine (QUESTRAN) 4 GM/DOSE powder BEGIN WITH 1/4 TSP QOD. FOLLOW PROTOCOL  1  . clotrimazole (LOTRIMIN) 1 % cream Apply to affected area 2 times daily for 2 weeks 15 g 0  . ELDERBERRY PO Take by mouth.    . escitalopram (LEXAPRO) 10 MG tablet Take by mouth.    . fluticasone (FLOVENT HFA) 110 MCG/ACT inhaler Inhale 2 puffs into the lungs 2 (two) times daily. 1  Inhaler 12  . hydrochlorothiazide (MICROZIDE) 12.5 MG capsule Take 12.5 mg by mouth daily.    . hydrOXYzine (VISTARIL) 25 MG capsule TAKE ONE CAPSULE BY MOUTH ONE TIME DAILY AT BEDTIME AS NEEDED 30 capsule 5  . irbesartan (AVAPRO) 300 MG tablet Take 1 tablet (300 mg total) by mouth daily. 30 tablet 2  . methylPREDNISolone (MEDROL) 4 MG tablet 5 tab po day 1, 4  tab po day 2, 3 tab po day 3, 2 tab po day 4, 1 tab po day 5. (Patient taking differently: 5 tab po day 1, 4  tab po day 2, 3 tab po day 3, 2 tab po day 4, 1 tab po day 5.) 15 tablet 0  . montelukast (SINGULAIR) 10 MG tablet Take 1 tablet (10 mg total) by mouth at bedtime. 30 tablet 0  . pantoprazole (PROTONIX) 40 MG tablet Take 1 tablet (40 mg total) by mouth daily. 90 tablet 3  . PREBIOTIC PRODUCT PO Take by mouth. Mix in shake and drink once a day.    . promethazine (PHENERGAN) 12.5 MG tablet Take 1 tablet (12.5 mg total) by mouth every 8 (eight) hours as needed for nausea or vomiting. 12 tablet 0   No facility-administered medications prior to visit.    Allergies  Allergen Reactions  . Ceftriaxone Sodium In Dextrose Other (See Comments)    "heart races and cannot breathe"  . Guaifenesin & Derivatives Other (See Comments)    "heart races, dizziness, light-headedness"  . Meperidine Other (See Comments)    "heart races"  . Other Rash  . Penicillins Hives  . Pseudoephedrine Other (See Comments)    "heart races and high blood pressure"  . Rosuvastatin Calcium Nausea And Vomiting    Muscle aches   . Simvastatin Other (See Comments)    muscle aching   . Ciprofloxacin   . Rocephin [Ceftriaxone] Itching    Review of Systems  Constitutional: Negative for chills and fever.  Respiratory: Negative for cough, shortness of breath and wheezing.   Cardiovascular: Positive for leg swelling. Negative for chest pain and palpitations.  Gastrointestinal: Negative for abdominal pain, diarrhea, nausea and vomiting.  Musculoskeletal: Negative  for back pain.  Neurological: Negative for dizziness and headaches.       Objective:    Physical Exam Constitutional:      General: She is not in acute distress.    Appearance: Normal appearance. She is not ill-appearing.  HENT:     Head: Normocephalic and atraumatic.     Right Ear: External ear normal.  Left Ear: External ear normal.  Eyes:     Extraocular Movements: Extraocular movements intact.     Pupils: Pupils are equal, round, and reactive to light.  Cardiovascular:     Rate and Rhythm: Normal rate and regular rhythm.     Pulses: Normal pulses.     Heart sounds: Normal heart sounds. No murmur heard.   Pulmonary:     Effort: Pulmonary effort is normal. No respiratory distress.     Breath sounds: Normal breath sounds. No wheezing, rhonchi or rales.  Musculoskeletal:     Right lower leg: 1+ Pitting Edema present.     Left lower leg: 1+ Pitting Edema present.  Skin:    General: Skin is warm and dry.  Neurological:     Mental Status: She is alert and oriented to person, place, and time.  Psychiatric:        Behavior: Behavior normal.     BP 122/70 (BP Location: Left Arm, Patient Position: Sitting, Cuff Size: Normal)   Pulse 69   Temp 98.3 F (36.8 C) (Oral)   Resp 18   Ht 5\' 6"  (1.676 m)   Wt 218 lb (98.9 kg)   SpO2 98%   BMI 35.19 kg/m  Wt Readings from Last 3 Encounters:  10/15/20 218 lb (98.9 kg)  09/23/20 216 lb 12.8 oz (98.3 kg)  06/14/20 224 lb 3.2 oz (101.7 kg)    Diabetic Foot Exam - Simple   No data filed    Lab Results  Component Value Date   WBC 7.6 10/15/2020   HGB 14.0 10/15/2020   HCT 40.4 10/15/2020   PLT 345.0 10/15/2020   GLUCOSE 90 10/15/2020   CHOL 292 (H) 10/15/2020   TRIG 181.0 (H) 10/15/2020   HDL 66.80 10/15/2020   LDLCALC 189 (H) 10/15/2020   ALT 26 10/15/2020   AST 18 10/15/2020   NA 136 10/15/2020   K 4.4 10/15/2020   CL 96 10/15/2020   CREATININE 0.96 10/15/2020   BUN 22 10/15/2020   CO2 31 10/15/2020   TSH  2.34 10/15/2020   MICROALBUR <0.7 12/11/2019    Lab Results  Component Value Date   TSH 2.34 10/15/2020   Lab Results  Component Value Date   WBC 7.6 10/15/2020   HGB 14.0 10/15/2020   HCT 40.4 10/15/2020   MCV 88.7 10/15/2020   PLT 345.0 10/15/2020   Lab Results  Component Value Date   NA 136 10/15/2020   K 4.4 10/15/2020   CO2 31 10/15/2020   GLUCOSE 90 10/15/2020   BUN 22 10/15/2020   CREATININE 0.96 10/15/2020   BILITOT 0.5 10/15/2020   ALKPHOS 86 10/15/2020   AST 18 10/15/2020   ALT 26 10/15/2020   PROT 7.4 10/15/2020   ALBUMIN 4.1 10/15/2020   CALCIUM 9.9 10/15/2020   ANIONGAP 11 02/01/2020   GFR 66.76 10/15/2020   Lab Results  Component Value Date   CHOL 292 (H) 10/15/2020   Lab Results  Component Value Date   HDL 66.80 10/15/2020   Lab Results  Component Value Date   LDLCALC 189 (H) 10/15/2020   Lab Results  Component Value Date   TRIG 181.0 (H) 10/15/2020   Lab Results  Component Value Date   CHOLHDL 4 10/15/2020   No results found for: HGBA1C     Assessment & Plan:   Problem List Items Addressed This Visit      Unprioritized   Essential hypertension    Well controlled, no changes to meds.  Encouraged heart healthy diet such as the DASH diet and exercise as tolerated.       Hyponatremia    Recheck today      Hypothyroidism - Primary    Per endo      Relevant Orders   Thyroid Panel With TSH (Completed)   CBC with Differential/Platelet (Completed)   Lipid panel (Completed)   Comprehensive metabolic panel (Completed)   Vitamin B12 (Completed)   Vitamin D (25 hydroxy) (Completed)   Iron, TIBC and Ferritin Panel (Completed)    Other Visit Diagnoses    Hypertension, unspecified type       Relevant Orders   Thyroid Panel With TSH (Completed)   CBC with Differential/Platelet (Completed)   Lipid panel (Completed)   Comprehensive metabolic panel (Completed)   Vitamin B12 (Completed)   Vitamin D (25 hydroxy) (Completed)   Iron,  TIBC and Ferritin Panel (Completed)      No orders of the defined types were placed in this encounter.   IDonato Schultz, DO, personally preformed the services described in this documentation.  All medical record entries made by the scribe were at my direction and in my presence.  I have reviewed the chart and discharge instructions (if applicable) and agree that the record reflects my personal performance and is accurate and complete. 10/15/2020  I,Jenna Arroyo,acting as a scribe for Donato Schultz, DO.,have documented all relevant documentation on the behalf of Donato Schultz, DO,as directed by  Donato Schultz, DO while in the presence of Donato Schultz, DO.   Donato Schultz, DO

## 2020-10-15 NOTE — Patient Instructions (Signed)

## 2020-10-16 LAB — COMPREHENSIVE METABOLIC PANEL
ALT: 26 U/L (ref 0–35)
AST: 18 U/L (ref 0–37)
Albumin: 4.1 g/dL (ref 3.5–5.2)
Alkaline Phosphatase: 86 U/L (ref 39–117)
BUN: 22 mg/dL (ref 6–23)
CO2: 31 mEq/L (ref 19–32)
Calcium: 9.9 mg/dL (ref 8.4–10.5)
Chloride: 96 mEq/L (ref 96–112)
Creatinine, Ser: 0.96 mg/dL (ref 0.40–1.20)
GFR: 66.76 mL/min (ref >60.00)
Glucose, Bld: 90 mg/dL (ref 70–99)
Potassium: 4.4 mEq/L (ref 3.5–5.1)
Sodium: 136 mEq/L (ref 135–145)
Total Bilirubin: 0.5 mg/dL (ref 0.2–1.2)
Total Protein: 7.4 g/dL (ref 6.0–8.3)

## 2020-10-16 LAB — CBC WITH DIFFERENTIAL/PLATELET
Basophils Absolute: 0.1 10*3/uL (ref 0.0–0.1)
Basophils Relative: 1.2 % (ref 0.0–3.0)
Eosinophils Absolute: 0.2 10*3/uL (ref 0.0–0.7)
Eosinophils Relative: 2.5 % (ref 0.0–5.0)
HCT: 40.4 % (ref 36.0–46.0)
Hemoglobin: 14 g/dL (ref 12.0–15.0)
Lymphocytes Relative: 26.8 % (ref 12.0–46.0)
Lymphs Abs: 2 10*3/uL (ref 0.7–4.0)
MCHC: 34.6 g/dL (ref 30.0–36.0)
MCV: 88.7 fl (ref 78.0–100.0)
Monocytes Absolute: 0.4 10*3/uL (ref 0.1–1.0)
Monocytes Relative: 5.5 % (ref 3.0–12.0)
Neutro Abs: 4.9 10*3/uL (ref 1.4–7.7)
Neutrophils Relative %: 64 % (ref 43.0–77.0)
Platelets: 345 10*3/uL (ref 150.0–400.0)
RBC: 4.55 Mil/uL (ref 3.87–5.11)
RDW: 13.1 % (ref 11.5–15.5)
WBC: 7.6 10*3/uL (ref 4.0–10.5)

## 2020-10-16 LAB — LIPID PANEL
Cholesterol: 292 mg/dL — ABNORMAL HIGH (ref 0–200)
HDL: 66.8 mg/dL (ref >39.00)
LDL Cholesterol: 189 mg/dL — ABNORMAL HIGH (ref 0–99)
NonHDL: 225.54
Total CHOL/HDL Ratio: 4
Triglycerides: 181 mg/dL — ABNORMAL HIGH (ref 0.0–149.0)
VLDL: 36.2 mg/dL (ref 0.0–40.0)

## 2020-10-16 LAB — IRON,TIBC AND FERRITIN PANEL
%SAT: 19 % (calc) (ref 16–45)
Ferritin: 71 ng/mL (ref 16–232)
Iron: 66 ug/dL (ref 45–160)
TIBC: 341 mcg/dL (calc) (ref 250–450)

## 2020-10-16 LAB — THYROID PANEL WITH TSH
Free Thyroxine Index: 1.6 (ref 1.4–3.8)
T3 Uptake: 25 % (ref 22–35)
T4, Total: 6.5 ug/dL (ref 5.1–11.9)
TSH: 2.34 mIU/L

## 2020-10-16 LAB — VITAMIN B12: Vitamin B-12: 515 pg/mL (ref 211–911)

## 2020-10-16 LAB — VITAMIN D 25 HYDROXY (VIT D DEFICIENCY, FRACTURES): VITD: 33.54 ng/mL (ref 30.00–100.00)

## 2020-10-16 NOTE — Assessment & Plan Note (Signed)
Well controlled, no changes to meds. Encouraged heart healthy diet such as the DASH diet and exercise as tolerated.  °

## 2020-10-16 NOTE — Assessment & Plan Note (Signed)
Per endo °

## 2020-10-16 NOTE — Assessment & Plan Note (Signed)
Recheck today. 

## 2020-10-30 ENCOUNTER — Telehealth: Payer: Self-pay | Admitting: Family Medicine

## 2020-10-30 NOTE — Telephone Encounter (Signed)
Patient is calling to see if she can take nexletol For bad Cholestrol. She states people say that it helps prevent ligament injuries. Patient is scheduled to see her   cardiologist on 05/23, please advise. I did inform her that her PCP is on vacation, however she is insisting that another provider give their recommendation.

## 2020-10-31 NOTE — Telephone Encounter (Signed)
I have never used this med. I looked it up and it says for heterozygous familial hypercholesterolemia or as an add on when someone is being treated for know CAD but I do not see either in her chart. Likely would not be paid for by insurance but her best bet is to discuss with cardiology because they would be the most likely to know how to get it approved.

## 2020-10-31 NOTE — Telephone Encounter (Signed)
Left another call to return call.

## 2020-10-31 NOTE — Telephone Encounter (Signed)
Left message to return call 

## 2020-10-31 NOTE — Telephone Encounter (Signed)
Pt says she was on this medication in 2016. She is looking to restart it. Her Endocrinologist is looking into the medication and dosing as well since she can not see Cardiology for a while.   She is aware that you will respond to the message once you have a chance.

## 2020-11-04 NOTE — Telephone Encounter (Signed)
Cardiology usually rx this since it is so new

## 2020-11-11 ENCOUNTER — Telehealth: Payer: Self-pay

## 2020-11-11 NOTE — Telephone Encounter (Signed)
Caller states her BP is 150/94.  Disp. Time [name] Disposition Final User 11/11/2020 12:02:08 PM Attempt made - message left Freida Busman, RN, Diane 11/11/2020 12:26:17 PM FINAL ATTEMPT MADE - message left Freida Busman RN, Diane 11/11/2020 12:26:28 PM Send to RN Final Attempt Caren Griffins, RN, Diane 11/11/2020 1:09:23 PM Send To Clinical Follow Up Gwyndolyn Kaufman, RN, April 11/11/2020 1:11:34 PM FINAL ATTEMPT MADE - message left Yes Cantrell, RN, Lanora Manis

## 2020-11-11 NOTE — Telephone Encounter (Signed)
Spoke with patient. Pt states taking blood pressure medication late and states drinking Gatorade. Pt reports blood pressure 130/100 at the time of the call. Pt reports not sxs. Pt advised to monitor blood pressure today and tomorrow and taking blood pressure medication normal time. Pt advised to call tomorrow if blood pressures remain high.

## 2020-11-15 ENCOUNTER — Telehealth: Payer: Self-pay | Admitting: Psychiatry

## 2020-11-15 ENCOUNTER — Other Ambulatory Visit: Payer: Self-pay | Admitting: Psychiatry

## 2020-11-15 DIAGNOSIS — Z8659 Personal history of other mental and behavioral disorders: Secondary | ICD-10-CM

## 2020-11-15 DIAGNOSIS — F411 Generalized anxiety disorder: Secondary | ICD-10-CM

## 2020-11-15 NOTE — Telephone Encounter (Signed)
Pt called and requested a refill.Please review.Is this the correct amount?

## 2020-11-15 NOTE — Telephone Encounter (Signed)
Next visit is 02/11/21. Requesting refill on Lexapro 10 mg called to:  [id number] - [place], [place] - [address] Phone:  [phone]  Fax:  [phone]

## 2020-11-15 NOTE — Telephone Encounter (Signed)
We had to separate requests for Lexapro.  I filled the request for 5 mg which is the dose I gave her at the last appointment.  There is also a 10 mg request but I do not see that ever ordered that dosage.  Could you please ask her which dose she is taking?

## 2020-11-15 NOTE — Telephone Encounter (Signed)
I sent you a refill request for her please review

## 2020-11-18 NOTE — Telephone Encounter (Signed)
Pt stated she spoke to a on call nurse/dr a while back when her mom was in the hospital,she asked for a increase and was sent 10 mg.I don't see the note but I do see where she was sent 10 mg.She said this dose works for her.She already picked up the 5 mg that was sent on 5/13 and she will take 2 a day.

## 2020-11-19 ENCOUNTER — Other Ambulatory Visit: Payer: Self-pay | Admitting: Psychiatry

## 2020-11-19 MED ORDER — ESCITALOPRAM OXALATE 10 MG PO TABS: 10.0000 mg | ORAL_TABLET | Freq: Every day | ORAL | 1 refills | Status: DC

## 2020-11-19 NOTE — Telephone Encounter (Signed)
Noted thanks,

## 2020-11-20 ENCOUNTER — Telehealth: Payer: Self-pay | Admitting: Family Medicine

## 2020-11-20 NOTE — Telephone Encounter (Signed)
Pt,would like a 3 month prescription   Medication: irbesartan (AVAPRO) 300 MG tablet [[id number]]      Has the patient contacted their pharmacy? no (If no, request that the patient contact the pharmacy for the refill.) (If yes, when and what did the pharmacy advise?)    Preferred Pharmacy (with phone number or street name):  [facility] [address] - [place], [place] - [address] Phone:  [phone]  Fax:  [phone]         Agent: Please be advised that RX refills may take up to 3 business days. We ask that you follow-up with your pharmacy.

## 2020-11-25 ENCOUNTER — Telehealth: Payer: Self-pay

## 2020-11-25 NOTE — Telephone Encounter (Signed)
Yes, return to Lexapro 5 mg daily as she requests.  She can split the 10 mg tablets if she wants or we can send in RX for Lexapro 5 mg #90.

## 2020-11-25 NOTE — Telephone Encounter (Signed)
Pt contacted doctor on call this weekend concerning her issues with Lexapro 10 mg. Since the increase to 10 mg she has been unable to sleep, reports black circles under her eyes, gets to sleep about 1-2 am. She also reports on Saturday she had a anxiety attack, she felt it was medication related not just her anxiety. [name], NP advised her to stop Lexapro and nurse would contact her Monday.  She asks about trying just the Lexapro 5 mg, she reports it helped and it didn't effect her sleep like 10 mg.  Informed her I would touch base with Dr. Jennelle Human and call back later today or on Tuesday. She said no rush and appreciative of call.

## 2020-11-26 ENCOUNTER — Other Ambulatory Visit: Payer: Self-pay | Admitting: Psychiatry

## 2020-11-26 MED ORDER — DULOXETINE HCL 20 MG PO CPEP: 20.0000 mg | ORAL_CAPSULE | Freq: Every day | ORAL | 1 refills | Status: DC

## 2020-11-26 NOTE — Telephone Encounter (Signed)
Pt stated the lexapro made her have increased anxiety and she thinks its best if she goes back to a very low dose of Cymbalta.She stopped Cymbalta due to it spiking her blood pressure.She says her bp was fine at the 20 mg dose.

## 2020-11-26 NOTE — Telephone Encounter (Signed)
Okay she can abruptly stop Lexapro and start Cymbalta 20 mg daily per her request.  Prescription was sent in

## 2020-11-26 NOTE — Telephone Encounter (Signed)
Please add to cancellation list

## 2020-11-27 NOTE — Telephone Encounter (Signed)
Pt informed

## 2020-11-29 ENCOUNTER — Ambulatory Visit: Payer: Self-pay | Admitting: Family Medicine

## 2020-11-29 ENCOUNTER — Telehealth: Payer: Self-pay | Admitting: *Deleted

## 2020-11-29 NOTE — Telephone Encounter (Signed)
Patient has an appt for 6/3.

## 2020-11-29 NOTE — Telephone Encounter (Signed)
Who Is Calling Patient / Member / Family / Caregiver Call Type Triage / Clinical Relationship To Patient Self Return Phone Number [phone] (Primary) Chief Complaint Blood Pressure High Reason for Call Symptomatic / Request for Health Information Initial Comment Caller states her blood pressure is high, 135/94. Appointment scheduled for tomorrow. Translation No Nurse Assessment Nurse: Annye English, RN, Denise Date/Time (Eastern Time): 11/28/2020 9:33:01 AM Confirm and document reason for call. If symptomatic, describe symptoms. ---Caller states her blood pressure is high, 135/94. Appointment scheduled for tomorrow.  11/28/2020 9:36:00 AM See PCP within 2 Weeks Yes Carmon, RN, [employer]

## 2020-12-03 ENCOUNTER — Telehealth: Payer: Self-pay

## 2020-12-03 NOTE — Telephone Encounter (Signed)
Nurse Assessment Nurse: Langston Masker, RN, Amber Date/Time (Eastern Time): 11/30/2020 11:18:30 PM Confirm and document reason for call. If symptomatic, describe symptoms. ---Caller states on new channel blocker med, feeling tired, BP 92/67 left arm, headache. Sx started today. Does the patient have any new or worsening symptoms? ---Yes Will a triage be completed? ---Yes Related visit to physician within the last 2 weeks? ---Yes Does the PT have any chronic conditions? (i.e. diabetes, asthma, this includes High risk factors for pregnancy, etc.) ---Yes List chronic conditions. ---Lyme disease, chronic lung disease, HTN, cholesterol, Hashimoto, Malachi Carl Is this a behavioral health or substance abuse call? ---No Guidelines Guideline Title Affirmed Question Affirmed Notes Nurse Date/Time (Eastern Time) Blood Pressure - Low [1] Fall in systolic BP > 20 mm Hg from normal AND [2] dizzy, lightheaded, or weak Morris, Charity fundraiser, [facility] 11/30/2020 11:21:21 PM Disp. Time [name] Disposition Final User 11/30/2020 11:30:31 PM Go to ED Now (or PCP triage) Yes Morris, RN, Amber PLEASE NOTE: All timestamps contained within this report are represented as [place] Standard Time. CONFIDENTIALTY NOTICE: This fax transmission is intended only for the addressee. It contains information that is legally privileged, confidential or otherwise protected from use or disclosure. If you are not the intended recipient, you are strictly prohibited from reviewing, disclosing, copying using or disseminating any of this information or taking any action in reliance on or regarding this information. If you have received this fax in error, please notify us immediately by telephone so that we can arrange for its return to [place]. Phone: [phone], Toll-Free: [phone], Fax: [phone] Page: 2 of 2 Call Id: [id number] Caller Disagree/Comply Disagree Caller Understands Yes PreDisposition Home Care Care Advice Given Per  Guideline GO TO ED NOW (OR PCP TRIAGE): ANOTHER ADULT SHOULD DRIVE: * It is better and safer if another adult drives instead of you. CARE ADVICE given per Low Blood Pressure (Adult) guideline. Comments User: Hoyle Barr, RN Date/Time (Eastern Time): 11/30/2020 11:23:34 PM Current BP 98/66. Referrals GO TO FACILITY REFUSED GO TO FACILITY UNDECIDED

## 2020-12-04 NOTE — Telephone Encounter (Signed)
ok 

## 2020-12-04 NOTE — Telephone Encounter (Signed)
On 11/29/20 another after hours call was placed c/o elevated BP: 135/94. Pt already has a pending appointment on this Friday 12/06/20 please advise.

## 2020-12-04 NOTE — Telephone Encounter (Signed)
Cut avapro in 1/2 She has been seeing [facility] for her bp ----

## 2020-12-04 NOTE — Telephone Encounter (Signed)
FYI: Pt says her BP is in the 140s systolic now so shed rather stay on the whole tab and then see what it looks like at her OV on Friday.

## 2020-12-06 ENCOUNTER — Ambulatory Visit (INDEPENDENT_AMBULATORY_CARE_PROVIDER_SITE_OTHER): Payer: Self-pay | Admitting: Family Medicine

## 2020-12-06 ENCOUNTER — Other Ambulatory Visit: Payer: Self-pay

## 2020-12-06 ENCOUNTER — Encounter: Payer: Self-pay | Admitting: Family Medicine

## 2020-12-06 VITALS — BP 100/70 | HR 70 | Temp 99.1°F | Resp 18 | Ht 66.0 in | Wt 215.0 lb

## 2020-12-06 DIAGNOSIS — R3129 Other microscopic hematuria: Secondary | ICD-10-CM

## 2020-12-06 DIAGNOSIS — I959 Hypotension, unspecified: Secondary | ICD-10-CM

## 2020-12-06 DIAGNOSIS — R350 Frequency of micturition: Secondary | ICD-10-CM

## 2020-12-06 DIAGNOSIS — I10 Essential (primary) hypertension: Secondary | ICD-10-CM

## 2020-12-06 LAB — CBC WITH DIFFERENTIAL/PLATELET
Basophils Absolute: 0.1 10*3/uL (ref 0.0–0.1)
Basophils Relative: 0.7 % (ref 0.0–3.0)
Eosinophils Absolute: 0.2 10*3/uL (ref 0.0–0.7)
Eosinophils Relative: 2.3 % (ref 0.0–5.0)
HCT: 40.3 % (ref 36.0–46.0)
Hemoglobin: 13.8 g/dL (ref 12.0–15.0)
Lymphocytes Relative: 22.8 % (ref 12.0–46.0)
Lymphs Abs: 1.6 10*3/uL (ref 0.7–4.0)
MCHC: 34.3 g/dL (ref 30.0–36.0)
MCV: 90.1 fl (ref 78.0–100.0)
Monocytes Absolute: 0.3 10*3/uL (ref 0.1–1.0)
Monocytes Relative: 5 % (ref 3.0–12.0)
Neutro Abs: 4.7 10*3/uL (ref 1.4–7.7)
Neutrophils Relative %: 69.2 % (ref 43.0–77.0)
Platelets: 328 10*3/uL (ref 150.0–400.0)
RBC: 4.48 Mil/uL (ref 3.87–5.11)
RDW: 13.4 % (ref 11.5–15.5)
WBC: 6.8 10*3/uL (ref 4.0–10.5)

## 2020-12-06 LAB — POC URINALSYSI DIPSTICK (AUTOMATED)
Bilirubin, UA: NEGATIVE
Glucose, UA: NEGATIVE
Ketones, UA: NEGATIVE
Nitrite, UA: NEGATIVE
Protein, UA: NEGATIVE
Spec Grav, UA: 1.01 (ref 1.010–1.025)
Urobilinogen, UA: 0.2 E.U./dL
pH, UA: 7 (ref 5.0–8.0)

## 2020-12-06 LAB — TSH: TSH: 1.6 u[IU]/mL (ref 0.35–4.50)

## 2020-12-06 LAB — VITAMIN B12: Vitamin B-12: 444 pg/mL (ref 211–911)

## 2020-12-06 LAB — VITAMIN D 25 HYDROXY (VIT D DEFICIENCY, FRACTURES): VITD: 28.38 ng/mL — ABNORMAL LOW (ref 30.00–100.00)

## 2020-12-06 NOTE — Assessment & Plan Note (Signed)
bp running low  She has help her norvasc Pt will rto with her cuff so we can check it Check labs  Will reassess when she brings her cuff in

## 2020-12-06 NOTE — Assessment & Plan Note (Signed)
Check ua today  Culture pending

## 2020-12-06 NOTE — Progress Notes (Signed)
Patient ID: Jenna Arroyo, female    DOB: 12/18/1965  Age: 55 y.o. MRN: [id number]    Subjective:  Subjective  HPI Jenna GessLisa M Klonowski presents for an office visit today. She complains of irregular spike in BP and fatigue. She notes that she had stop taking 2.5 mg amlodipine PO Daily x8 days. She reports taking 20 mg of Cymbalta PO Daily for her dx of HTN x 8 days. She states that her at home BP is 127/87.  BP Readings from Last 3 Encounters:  12/06/20 100/70  10/15/20 122/70  09/23/20 (!) 142/78  She also complains of intermittent soreness in vaginal area and frequency. She states that, she doesn't experience any burning sensation. She notes having a new brand of toilet paper. She denies any chest pain, SOB, fever, abdominal pain, cough, chills, sore throat, back pain, HA, or N/V/D at this time.   Review of Systems  Constitutional: Positive for fatigue. Negative for chills and fever.  HENT: Negative for ear pain, rhinorrhea, sinus pressure, sinus pain, sore throat and tinnitus.   Eyes: Negative for pain.  Respiratory: Negative for cough, shortness of breath and wheezing.        (+) irregular spike in BP   Cardiovascular: Negative for chest pain.  Gastrointestinal: Negative for abdominal pain, anal bleeding, constipation, diarrhea, nausea and vomiting.  Genitourinary: Positive for frequency. Negative for flank pain.       (+) soreness in vaginal area  Musculoskeletal: Negative for back pain and neck pain.  Skin: Negative for rash.  Neurological: Negative for seizures, weakness, light-headedness, numbness and headaches.    History Past Medical History:  Diagnosis Date  . Anxiety   . High cholesterol   . Hypertension   . Lyme disease   . Thyroid disease     She has a past surgical history that includes Tonsilectomy/adenoidectomy with myringotomy; Mastoidectomy; Appendectomy; and Abdominal exploration surgery.   Her family history includes Heart disease in her father; High Cholesterol in  her brother, father, mother, and sister; Hypertension in her brother, mother, and sister.She reports that she quit smoking about 16 years ago. She has never used smokeless tobacco. She reports that she does not drink alcohol and does not use drugs.  Current Outpatient Medications on File Prior to Visit  Medication Sig Dispense Refill  . albuterol (PROVENTIL HFA;VENTOLIN HFA) 108 (90 Base) MCG/ACT inhaler Inhale 1-2 puffs into the lungs every 6 (six) hours as needed. 1 Inhaler 0  . ALPRAZolam (XANAX) 0.5 MG tablet TAKE ONE TABLET BY MOUTH FOUR TIMES A DAY (ONE TABLET EVERY MORNING, ONE TABLET AT NOON, ONE TABLET IN THE EVENING, AND ONE TABLET AT BEDTIME) 120 tablet 5  . ARMOUR THYROID 15 MG tablet Take 3 tablets (45 mg total) by mouth daily. 90 tablet 3  . atenolol (TENORMIN) 25 MG tablet Take 1 tablet by mouth twice daily 60 tablet 3  . azelastine (ASTELIN) 0.1 % nasal spray Place 2 sprays into both nostrils 2 (two) times daily. Use in each nostril as directed 30 mL 1  . Charcoal 200 MG CAPS Take by mouth every other day.    . charcoal activated, NO SORBITOL, (ACTIDOSE-AQUA) suspension Take 50 g by mouth once. 200 mg    . Cholecalciferol (VITAMIN D) 10 MCG/ML LIQD Take by mouth.    . cholestyramine (QUESTRAN) 4 GM/DOSE powder BEGIN WITH 1/4 TSP QOD. FOLLOW PROTOCOL  1  . clotrimazole (LOTRIMIN) 1 % cream Apply to affected area 2 times daily for 2 weeks  15 g 0  . DULoxetine (CYMBALTA) 20 MG capsule Take 1 capsule (20 mg total) by mouth daily. 30 capsule 1  . ELDERBERRY PO Take by mouth.    . fluticasone (FLOVENT HFA) 110 MCG/ACT inhaler Inhale 2 puffs into the lungs 2 (two) times daily. 1 Inhaler 12  . hydrochlorothiazide (MICROZIDE) 12.5 MG capsule Take 12.5 mg by mouth daily.    . hydrOXYzine (VISTARIL) 25 MG capsule TAKE ONE CAPSULE BY MOUTH ONE TIME DAILY AT BEDTIME AS NEEDED 30 capsule 5  . irbesartan (AVAPRO) 300 MG tablet Take 1 tablet (300 mg total) by mouth daily. 30 tablet 2  .  methylPREDNISolone (MEDROL) 4 MG tablet 5 tab po day 1, 4  tab po day 2, 3 tab po day 3, 2 tab po day 4, 1 tab po day 5. (Patient taking differently: 5 tab po day 1, 4  tab po day 2, 3 tab po day 3, 2 tab po day 4, 1 tab po day 5.) 15 tablet 0  . montelukast (SINGULAIR) 10 MG tablet Take 1 tablet (10 mg total) by mouth at bedtime. 30 tablet 0  . pantoprazole (PROTONIX) 40 MG tablet Take 1 tablet (40 mg total) by mouth daily. 90 tablet 3  . PREBIOTIC PRODUCT PO Take by mouth. Mix in shake and drink once a day.    . promethazine (PHENERGAN) 12.5 MG tablet Take 1 tablet (12.5 mg total) by mouth every 8 (eight) hours as needed for nausea or vomiting. 12 tablet 0   No current facility-administered medications on file prior to visit.     Objective:  Objective  Physical Exam Vitals and nursing note reviewed.  Constitutional:      General: She is not in acute distress.    Appearance: Normal appearance. She is well-developed. She is not ill-appearing.  HENT:     Head: Normocephalic and atraumatic.     Right Ear: External ear normal.     Left Ear: External ear normal.     Nose: Nose normal.  Eyes:     General:        Right eye: No discharge.        Left eye: No discharge.     Extraocular Movements: Extraocular movements intact.     Pupils: Pupils are equal, round, and reactive to light.  Cardiovascular:     Rate and Rhythm: Normal rate and regular rhythm.     Pulses: Normal pulses.     Heart sounds: Normal heart sounds. No murmur heard. No friction rub. No gallop.   Pulmonary:     Effort: Pulmonary effort is normal. No respiratory distress.     Breath sounds: Normal breath sounds. No stridor. No wheezing, rhonchi or rales.  Chest:     Chest wall: No tenderness.  Abdominal:     General: Bowel sounds are normal. There is no distension.     Palpations: Abdomen is soft. There is no mass.     Tenderness: There is no abdominal tenderness. There is no guarding or rebound.     Hernia: No  hernia is present.  Musculoskeletal:        General: Normal range of motion.     Cervical back: Normal range of motion and neck supple.     Right lower leg: No edema.     Left lower leg: No edema.  Skin:    General: Skin is warm and dry.  Neurological:     Mental Status: She is alert and oriented to  person, place, and time.  Psychiatric:        Behavior: Behavior normal.        Thought Content: Thought content normal.    BP 100/70 (BP Location: Right Arm, Patient Position: Sitting, Cuff Size: Large)   Pulse 70   Temp 99.1 F (37.3 C) (Oral)   Resp 18   Ht 5\' 6"  (1.676 m)   Wt 215 lb (97.5 kg)   SpO2 98%   BMI 34.70 kg/m  Wt Readings from Last 3 Encounters:  12/06/20 215 lb (97.5 kg)  10/15/20 218 lb (98.9 kg)  09/23/20 216 lb 12.8 oz (98.3 kg)     Lab Results  Component Value Date   WBC 7.6 10/15/2020   HGB 14.0 10/15/2020   HCT 40.4 10/15/2020   PLT 345.0 10/15/2020   GLUCOSE 90 10/15/2020   CHOL 292 (H) 10/15/2020   TRIG 181.0 (H) 10/15/2020   HDL 66.80 10/15/2020   LDLCALC 189 (H) 10/15/2020   ALT 26 10/15/2020   AST 18 10/15/2020   NA 136 10/15/2020   K 4.4 10/15/2020   CL 96 10/15/2020   CREATININE 0.96 10/15/2020   BUN 22 10/15/2020   CO2 31 10/15/2020   TSH 2.34 10/15/2020   MICROALBUR <0.7 12/11/2019    02/10/2020 AXILLA LEFT  Result Date: 06/19/2020 CLINICAL DATA:  55 year old with chronic Lyme disease, presenting with diffuse BILATERAL breast pain and possible palpable lumps in both axilla. EXAM: DIGITAL DIAGNOSTIC BILATERAL MAMMOGRAM WITH CAD AND TOMO ULTRASOUND BILATERAL AXILLAE COMPARISON:  Previous exam(s). ACR Breast Density Category b: There are scattered areas of fibroglandular density. FINDINGS: Tomosynthesis and synthesized full field CC and MLO views of both breasts were obtained. Tomosynthesis and synthesized spot tangential views of the areas of palpable concern in both axillae were also obtained. Mammographic images were processed with CAD.  RIGHT: Asymmetric focally dense normal fibroglandular tissue in the UPPER breast at POSTERIOR depth, unchanged since the 2019 mammogram, confirming benignity. No visible mass or lymphadenopathy in the RIGHT axilla. No findings suspicious for malignancy. Targeted RIGHT axillary ultrasound is performed, showing normal fibrofatty tissue. There is no pathologic lymphadenopathy. The triceps muscle is present in the area of palpable concern. LEFT: No findings suspicious for malignancy. No visible mass or lymphadenopathy in the LEFT axilla. Targeted LEFT axillary ultrasound is performed, showing normal fibrofatty tissue. There is no pathologic lymphadenopathy. Again, the triceps muscle is present in the area of palpable concern. On correlative physical exam, there is palpable tissue in the upper axilla bilaterally, and on ultrasound, these palpable tissues correspond to the triceps muscle. IMPRESSION: 1. No mammographic evidence of malignancy involving either breast. 2. No pathologic lymphadenopathy involving either axilla. RECOMMENDATION: Screening mammogram in one year.(Code:SM-B-01Y) I have discussed the findings and recommendations with the patient. If applicable, a reminder letter will be sent to the patient regarding the next appointment. BI-RADS CATEGORY  1: Negative. Electronically Signed   By: 2020 M.D.   On: 06/19/2020 16:27   06/21/2020 AXILLA RIGHT  Result Date: 06/19/2020 CLINICAL DATA:  55 year old with chronic Lyme disease, presenting with diffuse BILATERAL breast pain and possible palpable lumps in both axilla. EXAM: DIGITAL DIAGNOSTIC BILATERAL MAMMOGRAM WITH CAD AND TOMO ULTRASOUND BILATERAL AXILLAE COMPARISON:  Previous exam(s). ACR Breast Density Category b: There are scattered areas of fibroglandular density. FINDINGS: Tomosynthesis and synthesized full field CC and MLO views of both breasts were obtained. Tomosynthesis and synthesized spot tangential views of the areas of palpable concern in  both axillae were  also obtained. Mammographic images were processed with CAD. RIGHT: Asymmetric focally dense normal fibroglandular tissue in the UPPER breast at POSTERIOR depth, unchanged since the 2019 mammogram, confirming benignity. No visible mass or lymphadenopathy in the RIGHT axilla. No findings suspicious for malignancy. Targeted RIGHT axillary ultrasound is performed, showing normal fibrofatty tissue. There is no pathologic lymphadenopathy. The triceps muscle is present in the area of palpable concern. LEFT: No findings suspicious for malignancy. No visible mass or lymphadenopathy in the LEFT axilla. Targeted LEFT axillary ultrasound is performed, showing normal fibrofatty tissue. There is no pathologic lymphadenopathy. Again, the triceps muscle is present in the area of palpable concern. On correlative physical exam, there is palpable tissue in the upper axilla bilaterally, and on ultrasound, these palpable tissues correspond to the triceps muscle. IMPRESSION: 1. No mammographic evidence of malignancy involving either breast. 2. No pathologic lymphadenopathy involving either axilla. RECOMMENDATION: Screening mammogram in one year.(Code:SM-B-01Y) I have discussed the findings and recommendations with the patient. If applicable, a reminder letter will be sent to the patient regarding the next appointment. BI-RADS CATEGORY  1: Negative. Electronically Signed   By: Hulan Saas M.D.   On: 06/19/2020 16:27   MS DIGITAL DIAG TOMO BILAT  Result Date: 06/19/2020 CLINICAL DATA:  55 year old with chronic Lyme disease, presenting with diffuse BILATERAL breast pain and possible palpable lumps in both axilla. EXAM: DIGITAL DIAGNOSTIC BILATERAL MAMMOGRAM WITH CAD AND TOMO ULTRASOUND BILATERAL AXILLAE COMPARISON:  Previous exam(s). ACR Breast Density Category b: There are scattered areas of fibroglandular density. FINDINGS: Tomosynthesis and synthesized full field CC and MLO views of both breasts were obtained.  Tomosynthesis and synthesized spot tangential views of the areas of palpable concern in both axillae were also obtained. Mammographic images were processed with CAD. RIGHT: Asymmetric focally dense normal fibroglandular tissue in the UPPER breast at POSTERIOR depth, unchanged since the 2019 mammogram, confirming benignity. No visible mass or lymphadenopathy in the RIGHT axilla. No findings suspicious for malignancy. Targeted RIGHT axillary ultrasound is performed, showing normal fibrofatty tissue. There is no pathologic lymphadenopathy. The triceps muscle is present in the area of palpable concern. LEFT: No findings suspicious for malignancy. No visible mass or lymphadenopathy in the LEFT axilla. Targeted LEFT axillary ultrasound is performed, showing normal fibrofatty tissue. There is no pathologic lymphadenopathy. Again, the triceps muscle is present in the area of palpable concern. On correlative physical exam, there is palpable tissue in the upper axilla bilaterally, and on ultrasound, these palpable tissues correspond to the triceps muscle. IMPRESSION: 1. No mammographic evidence of malignancy involving either breast. 2. No pathologic lymphadenopathy involving either axilla. RECOMMENDATION: Screening mammogram in one year.(Code:SM-B-01Y) I have discussed the findings and recommendations with the patient. If applicable, a reminder letter will be sent to the patient regarding the next appointment. BI-RADS CATEGORY  1: Negative. Electronically Signed   By: Hulan Saas M.D.   On: 06/19/2020 16:27     Assessment & Plan:  Plan   No orders of the defined types were placed in this encounter.   Problem List Items Addressed This Visit      Unprioritized   Essential hypertension    bp running low  She has help her norvasc Pt will rto with her cuff so we can check it Check labs  Will reassess when she brings her cuff in       Urinary frequency    Check ua today  Culture pending       Relevant  Orders   POCT Urinalysis  Dipstick (Automated) (Completed)   Urine Culture    Other Visit Diagnoses    Hypotension, unspecified hypotension type    -  Primary   Relevant Orders   Comprehensive metabolic panel   TSH   CBC with Differential/Platelet   Vitamin D (25 hydroxy)   Vitamin B12   Microscopic hematuria          Follow-up: Return in about 2 weeks (around 12/20/2020), or if symptoms worsen or fail to improve, for +.   I,Gordon Zheng,acting as a Neurosurgeon for [employer], DO.,have documented all relevant documentation on the behalf of Donato Schultz, DO,as directed by  Donato Schultz, DO while in the presence of Donato Schultz, DO.  I, Donato Schultz, DO, have reviewed all documentation for this visit. The documentation on 12/06/20 for the exam, diagnosis, procedures, and orders are all accurate and complete.

## 2020-12-06 NOTE — Patient Instructions (Signed)
Hypotension As your heart beats, it forces blood through your body. Hypotension, commonly called low blood pressure, is when the force of blood pumping through your arteries is too weak. Arteries are blood vessels that carry blood from the heart throughout the body. Depending on the cause and severity, hypotension may be harmless (benign) or may cause serious problems (be critical). When blood pressure is too low, you may not get enough blood to your brain or to the rest of your organs. This can cause weakness, light-headedness, rapid heartbeat, and fainting. What are the causes? This condition may be caused by:  Blood loss.  Loss of body fluids (dehydration).  Heart problems.  Hormone (endocrine) problems.  Pregnancy.  Severe infection.  Lack of certain nutrients.  Severe allergic reactions (anaphylaxis).  Certain medicines, such as blood pressure medicine or medicines that make the body lose excess fluids (diuretics). Sometimes, hypotension may be caused by not taking medicine as directed, such as taking too much of a certain medicine. What increases the risk? The following factors may make you more likely to develop this condition:  Age. Risk increases as you get older.  Conditions that affect the heart or the central nervous system.  Taking certain medicines, such as blood pressure medicine or diuretics.  Being pregnant. What are the signs or symptoms? Common symptoms of this condition include:  Weakness.  Light-headedness.  Dizziness.  Blurred vision.  Fatigue.  Rapid heartbeat.  Fainting, in severe cases. How is this diagnosed? This condition is diagnosed based on:  Your medical history.  Your symptoms.  Your blood pressure measurement. Your health care provider will check your blood pressure when you are: ? Lying down. ? Sitting. ? Standing. A blood pressure reading is recorded as two numbers, such as "120 over 80" (or 120/80). The first ("top")  number is called the systolic pressure. It is a measure of the pressure in your arteries as your heart beats. The second ("bottom") number is called the diastolic pressure. It is a measure of the pressure in your arteries when your heart relaxes between beats. Blood pressure is measured in a unit called mm Hg. Healthy blood pressure for most adults is 120/80. If your blood pressure is below 90/60, you may be diagnosed with hypotension. Other information or tests that may be used to diagnose hypotension include:  Your other vital signs, such as your heart rate and temperature.  Blood tests.  Tilt table test. For this test, you will be safely secured to a table that moves you from a lying position to an upright position. Your heart rhythm and blood pressure will be monitored during the test. How is this treated? Treatment for this condition may include:  Changing your diet. This may involve eating more salt (sodium) or drinking more water.  Taking medicines to raise your blood pressure.  Changing the dosage of certain medicines you are taking that might be lowering your blood pressure.  Wearing compression stockings. These stockings help to prevent blood clots and reduce swelling in your legs. In some cases, you may need to go to the hospital for:  Fluid replacement. This means you will receive fluids through an IV.  Blood replacement. This means you will receive donated blood through an IV (transfusion).  Treating an infection or heart problems, if this applies.  Monitoring. You may need to be monitored while medicines that you are taking wear off. Follow these instructions at home: Eating and drinking  Drink enough fluid to keep your urine   pale yellow.  Eat a healthy diet, and follow instructions from your health care provider about eating or drinking restrictions. A healthy diet includes: ? Fresh fruits and vegetables. ? Whole grains. ? Lean meats. ? Low-fat dairy  products.  Eat extra salt only as directed. Do not add extra salt to your diet unless your health care provider told you to do that.  Eat frequent, small meals.  Avoid standing up suddenly after eating.   Medicines  Take over-the-counter and prescription medicines only as told by your health care provider. ? Follow instructions from your health care provider about changing the dosage of your current medicines, if this applies. ? Do not stop or adjust any of your medicines on your own. General instructions  Wear compression stockings as told by your health care provider.  Get up slowly from lying down or sitting positions. This gives your blood pressure a chance to adjust.  Avoid hot showers and excessive heat as directed by your health care provider.  Return to your normal activities as told by your health care provider. Ask your health care provider what activities are safe for you.  Do not use any products that contain nicotine or tobacco, such as cigarettes, e-cigarettes, and chewing tobacco. If you need help quitting, ask your health care provider.  Keep all follow-up visits as told by your health care provider. This is important.   Contact a health care provider if you:  Vomit.  Have diarrhea.  Have a fever for more than 2-3 days.  Feel more thirsty than usual.  Feel weak and tired. Get help right away if you:  Have chest pain.  Have a fast or irregular heartbeat.  Develop numbness in any part of your body.  Cannot move your arms or your legs.  Have trouble speaking.  Become sweaty or feel light-headed.  Faint.  Feel short of breath.  Have trouble staying awake.  Feel confused. Summary  Hypotension is when the force of blood pumping through your arteries is too weak.  Hypotension may be harmless (benign) or may cause serious problems (be critical).  Treatment for this condition may include changing your diet, changing your medicines, and wearing  compression stockings.  In some cases, you may need to go to the hospital for fluid or blood replacement. This information is not intended to replace advice given to you by your health care provider. Make sure you discuss any questions you have with your health care provider. Document Revised: 12/16/2017 Document Reviewed: 12/16/2017 [employer] Patient Education  2021 [employer]  

## 2020-12-07 LAB — URINE CULTURE
MICRO NUMBER:: 11966338
SPECIMEN QUALITY:: ADEQUATE

## 2020-12-09 LAB — COMPREHENSIVE METABOLIC PANEL
ALT: 19 U/L (ref 0–35)
AST: 16 U/L (ref 0–37)
Albumin: 4.2 g/dL (ref 3.5–5.2)
Alkaline Phosphatase: 83 U/L (ref 39–117)
BUN: 10 mg/dL (ref 6–23)
CO2: 29 mEq/L (ref 19–32)
Calcium: 9.6 mg/dL (ref 8.4–10.5)
Chloride: 101 mEq/L (ref 96–112)
Creatinine, Ser: 1.01 mg/dL (ref 0.40–1.20)
GFR: 62.75 mL/min (ref >60.00)
Glucose, Bld: 80 mg/dL (ref 70–99)
Potassium: 4.2 mEq/L (ref 3.5–5.1)
Sodium: 140 mEq/L (ref 135–145)
Total Bilirubin: 0.5 mg/dL (ref 0.2–1.2)
Total Protein: 6.6 g/dL (ref 6.0–8.3)

## 2020-12-10 ENCOUNTER — Other Ambulatory Visit: Payer: Self-pay

## 2020-12-10 DIAGNOSIS — E559 Vitamin D deficiency, unspecified: Secondary | ICD-10-CM

## 2020-12-10 MED ORDER — VITAMIN D (ERGOCALCIFEROL) 1.25 MG (50000 UNIT) PO CAPS: 50000.0000 [IU] | ORAL_CAPSULE | ORAL | 0 refills | Status: DC

## 2020-12-11 ENCOUNTER — Other Ambulatory Visit: Payer: Self-pay

## 2020-12-11 ENCOUNTER — Encounter: Payer: Self-pay | Admitting: Psychiatry

## 2020-12-11 ENCOUNTER — Ambulatory Visit (INDEPENDENT_AMBULATORY_CARE_PROVIDER_SITE_OTHER): Payer: Self-pay | Admitting: Psychiatry

## 2020-12-11 DIAGNOSIS — F411 Generalized anxiety disorder: Secondary | ICD-10-CM

## 2020-12-11 DIAGNOSIS — F4001 Agoraphobia with panic disorder: Secondary | ICD-10-CM

## 2020-12-11 DIAGNOSIS — I10 Essential (primary) hypertension: Secondary | ICD-10-CM

## 2020-12-11 MED ORDER — DULOXETINE HCL 20 MG PO CPEP: 20.0000 mg | ORAL_CAPSULE | Freq: Every day | ORAL | 1 refills | Status: DC

## 2020-12-11 NOTE — Progress Notes (Signed)
Jenna Arroyo [id number] [birth year] 55 y.o.    Subjective:   Patient ID:  Jenna Arroyo is a 55 y.o. (DOB 1966/02/20) female.  Chief Complaint:  Chief Complaint  Patient presents with  . GAD (generalized anxiety disorder)  . Follow-up  . Anxiety    Anxiety Symptoms include nervous/anxious behavior. Patient reports no confusion, decreased concentration or suicidal ideas.     Jenna Arroyo presents to the office today for follow-up of anxiety and mood.  Last seen October 2020.  She had been having cognitive complaints as previously noted. Still recommend trial switch to Ativan 1 mg QID to see if cognition is better.  Her cognitive problems have frequently been in a contributor to her difficulty maintaining employment.  She is fearful about having Xanax withdrawal.  This was discussed in detail.  Also discussed in detail the potential sedative effects of benzodiazepines and especially in the first week of the transition from 1 of the medications to another.  It is likely that she will be more successful with the transition if she is a little more sleepy than normal the first week by using half of each medication then if we abruptly switch medications.  That has a much lower success rate in her case because of her worry about the transition. She agrees to a trial.  Will do 1/2 of 0.5 mg tablet tablet of Xanax and 1/2 of 1 mg tablet Ativan QID for 1 week. Then DC alprazolam and take Ativan 1 mg 3 times daily or 4 times daily to control anxiety.  Call back in 2 to 3 weeks to give a report of how well she is doing with the new medication. DC hydroxyzine because of its potential to cause cognitive problems. Continue duloxetine 60 mg daily.  10/18/19 appt with the following noted and no further med changes: She had difficulty implementing the transition and then complained of insomnia with lorazepam and was switched back to alprazolam after a week.  Also CO pain and nausea.   Overall doing OK except  son and D upset with her since December.  Disc this in detail.  Hasn't seen GD's since then.  Working at job she loves but very PT. Exercising and lost 18#  02/21/20 with the following noted: Hanging in there with stress.  Not allowed to see GD still.  Doesn't know why. No Covid.  OCC BP spikes. No major med changes. Satisfied with duloxetine and Xanax.  No full panic.   Sleep waxes and wanes but lately good.  Working same job as in April and likes it. Plan: no med changes  08/21/2020 appointment with the following noted: Stopped duloxetine for a week early Feb over BP concerns from it.  Convinced BP affected by 20 mg duloxetine which she's been taking for a few months.  Felt 30 mg was too high and then it started causing problems.  When off the duloxetine for a week then BP went down. Times when doesn't have it and the most noticeable thing is feeling tired and a little disconnected.  Longest off it was 2-4 weeks in last 6 mos. Has unresponsive PCP. Chronic tiredness.  Still dealing with chronic Lyme dz.  Plan: Stop duloxetine BC of BP concerns.  Monitor and record BP and share with PCP.  Stay off for a full month and if consistently more anxious then can add pure SSRI low dose Lexapro 5 mg for anxiety.  11/18/20 TC Pt had asked to increase Lexapro to  10 mg while mo in the hospital.  11/25/20 TC:  Pt contacted doctor on call this weekend concerning her issues with Lexapro 10 mg. Since the increase to 10 mg she has been unable to sleep, reports black circles under her eyes, gets to sleep about 1-2 am. She also reports on Saturday she had a anxiety attack, she felt it was medication related not just her anxiety. Jenna Rack, NP advised her to stop Lexapro and nurse would contact her Monday.  She asks about trying just the Lexapro 5 mg, she reports it helped and it didn't effect her sleep like 10 mg.  Pt stated the lexapro made her have increased anxiety and she thinks its best if she goes back  to a very low dose of Cymbalta.She stopped Cymbalta due to it spiking her blood pressure.She says her bp was fine at the 20 mg dose. MD: ok to stop Lexapro and return to duloxetine 20  12/11/20 appt noted: Under more stress and more anxious.  Stress about 3 mos.  Dealing with condo where she lives.  Not making enough money to get basic things she needs and asking for money from parents.  Wants father to fix things in the condo but he doesn't do anything.   Place had a leak in the past.    Taking duloxetine 20 and off Lexapro and on alprazolam 0.5 mg QID.   Worries over the condition in the world and thinks the world could end and she might not be able to get the Xanax and fears withdrawal.    She feels bible is predicting end of the world soon.    Patient reports stable mood and denies depressed or irritable moods.  Patient denies any recent difficulty with anxiety.  sleep affected by smokers around her apt.  Denies appetite disturbance.  Patient reports that energy and motivation have been good.  Patient denies any difficulty with concentration.  Patient denies any suicidal ideation. No panic.  Anxiety manageable.  Using Xanax mostly TID.  On duloxetine.  Sleep better in the day.    No questions concerns re: meds.  Past Psychiatric Medication Trials: Duloxetine 60, imipramine, desipramine, sertraline hyper, citalopram side effects, paroxetine chest pain,  Nuvigil edgy,  buspirone dizzy,  Xanax, Ambien anxiety, trazodone side effects, atenolol  Review of Systems:  Review of Systems  Constitutional: Positive for fatigue.       Low exercise tolerance  Gastrointestinal: Negative for vomiting.  Musculoskeletal: Positive for arthralgias and neck pain.  Neurological: Positive for headaches. Negative for tremors and weakness.       Foggy headed.  Psychiatric/Behavioral: Positive for sleep disturbance. Negative for agitation, behavioral problems, confusion, decreased concentration, dysphoric mood,  hallucinations, self-injury and suicidal ideas. The patient is nervous/anxious. The patient is not hyperactive.     Medications: I have reviewed the patient's current medications.  Current Outpatient Medications  Medication Sig Dispense Refill  . ALPRAZolam (XANAX) 0.5 MG tablet TAKE ONE TABLET BY MOUTH FOUR TIMES A DAY (ONE TABLET EVERY MORNING, ONE TABLET AT NOON, ONE TABLET IN THE EVENING, AND ONE TABLET AT BEDTIME) 120 tablet 5  . ARMOUR THYROID 15 MG tablet Take 3 tablets (45 mg total) by mouth daily. 90 tablet 3  . atenolol (TENORMIN) 25 MG tablet Take 1 tablet by mouth twice daily 60 tablet 3  . Cholecalciferol (VITAMIN D) 10 MCG/ML LIQD Take by mouth.    . hydrochlorothiazide (MICROZIDE) 12.5 MG capsule Take 12.5 mg by mouth daily.    Marland Kitchen  hydrOXYzine (VISTARIL) 25 MG capsule TAKE ONE CAPSULE BY MOUTH ONE TIME DAILY AT BEDTIME AS NEEDED 30 capsule 5  . irbesartan (AVAPRO) 300 MG tablet Take 1 tablet (300 mg total) by mouth daily. 30 tablet 2  . Vitamin D, Ergocalciferol, (DRISDOL) 1.25 MG (50000 UNIT) CAPS capsule Take 1 capsule (50,000 Units total) by mouth every 7 (seven) days. 12 capsule 0  . albuterol (PROVENTIL HFA;VENTOLIN HFA) 108 (90 Base) MCG/ACT inhaler Inhale 1-2 puffs into the lungs every 6 (six) hours as needed. (Patient not taking: Reported on 12/11/2020) 1 Inhaler 0  . amLODipine (NORVASC) 2.5 MG tablet Take 2.5 mg by mouth daily.    Marland Kitchen azelastine (ASTELIN) 0.1 % nasal spray Place 2 sprays into both nostrils 2 (two) times daily. Use in each nostril as directed (Patient not taking: Reported on 12/11/2020) 30 mL 1  . Charcoal 200 MG CAPS Take by mouth every other day. (Patient not taking: Reported on 12/11/2020)    . cholestyramine (QUESTRAN) 4 GM/DOSE powder BEGIN WITH 1/4 TSP QOD. FOLLOW PROTOCOL (Patient not taking: Reported on 12/11/2020)  1  . DULoxetine (CYMBALTA) 20 MG capsule Take 1 capsule (20 mg total) by mouth daily. 90 capsule 1  . ELDERBERRY PO Take by mouth. (Patient not  taking: Reported on 12/11/2020)    . fluticasone (FLOVENT HFA) 110 MCG/ACT inhaler Inhale 2 puffs into the lungs 2 (two) times daily. (Patient not taking: Reported on 12/11/2020) 1 Inhaler 12  . methylPREDNISolone (MEDROL) 4 MG tablet 5 tab po day 1, 4  tab po day 2, 3 tab po day 3, 2 tab po day 4, 1 tab po day 5. (Patient not taking: Reported on 12/11/2020) 15 tablet 0  . montelukast (SINGULAIR) 10 MG tablet Take 1 tablet (10 mg total) by mouth at bedtime. (Patient not taking: Reported on 12/11/2020) 30 tablet 0  . pantoprazole (PROTONIX) 40 MG tablet Take 1 tablet (40 mg total) by mouth daily. (Patient not taking: Reported on 12/11/2020) 90 tablet 3  . PREBIOTIC PRODUCT PO Take by mouth. Mix in shake and drink once a day. (Patient not taking: Reported on 12/11/2020)     No current facility-administered medications for this visit.    Medication Side Effects: None  Allergies:  Allergies  Allergen Reactions  . Ceftriaxone Sodium In Dextrose Other (See Comments)    "heart races and cannot breathe"  . Guaifenesin & Derivatives Other (See Comments)    "heart races, dizziness, light-headedness"  . Meperidine Other (See Comments)    "heart races"  . Other Rash  . Penicillins Hives  . Pseudoephedrine Other (See Comments)    "heart races and high blood pressure"  . Rosuvastatin Calcium Nausea And Vomiting    Muscle aches   . Simvastatin Other (See Comments)    muscle aching   . Ciprofloxacin   . Rocephin [Ceftriaxone] Itching    Past Medical History:  Diagnosis Date  . Anxiety   . High cholesterol   . Hypertension   . Lyme disease   . Thyroid disease     Family History  Problem Relation Age of Onset  . Hypertension Mother   . High Cholesterol Mother   . Heart disease Father   . High Cholesterol Father   . Hypertension Sister   . High Cholesterol Sister   . Hypertension Brother   . High Cholesterol Brother   . Colon cancer Neg Hx     Social History   Socioeconomic History  .  Marital status: Divorced  Spouse name: Not on file  . Number of children: Not on file  . Years of education: Not on file  . Highest education level: Not on file  Occupational History  . Not on file  Tobacco Use  . Smoking status: Former Smoker    Quit date: 2006    Years since quitting: 16.4  . Smokeless tobacco: Never Used  . Tobacco comment: socially   Vaping Use  . Vaping Use: Never used  Substance and Sexual Activity  . Alcohol use: No  . Drug use: No  . Sexual activity: Not Currently    Birth control/protection: Post-menopausal  Other Topics Concern  . Not on file  Social History Narrative  . Not on file   Social Determinants of Health   Financial Resource Strain: Not on file  Food Insecurity: Not on file  Transportation Needs: No Transportation Needs  . Lack of Transportation (Medical): No  . Lack of Transportation (Non-Medical): No  Physical Activity: Not on file  Stress: Not on file  Social Connections: Not on file  Intimate Partner Violence: Not on file    Past Medical History, Surgical history, Social history, and Family history were reviewed and updated as appropriate.   Please see review of systems for further details on the patient's review from today.   Objective:   Physical Exam:  There were no vitals taken for this visit.  Physical Exam Constitutional:      General: She is not in acute distress. Musculoskeletal:        General: No deformity.  Neurological:     Mental Status: She is alert and oriented to person, place, and time.     Cranial Nerves: No dysarthria.     Coordination: Coordination normal.  Psychiatric:        Attention and Perception: Perception normal. She is inattentive. She does not perceive auditory or visual hallucinations.        Mood and Affect: Mood is anxious. Mood is not depressed. Affect is not labile, blunt, angry, tearful or inappropriate.        Speech: Speech normal.        Behavior: Behavior normal. Behavior is  cooperative.        Thought Content: Thought content normal. Thought content is not paranoid or delusional. Thought content does not include homicidal or suicidal ideation. Thought content does not include homicidal or suicidal plan.        Cognition and Memory: Cognition and memory normal.        Judgment: Judgment normal.     Comments: Insight fair.  Patient has chronically low stress tolerance. Complain of chronic forgetfulness Fearful end of the world is coming soon.       Lab Review:     Component Value Date/Time   NA 140 12/06/2020 1018   K 4.2 12/06/2020 1018   CL 101 12/06/2020 1018   CO2 29 12/06/2020 1018   GLUCOSE 80 12/06/2020 1018   BUN 10 12/06/2020 1018   CREATININE 1.01 12/06/2020 1018   CREATININE 0.85 04/05/2020 1052   CALCIUM 9.6 12/06/2020 1018   PROT 6.6 12/06/2020 1018   ALBUMIN 4.2 12/06/2020 1018   AST 16 12/06/2020 1018   ALT 19 12/06/2020 1018   ALKPHOS 83 12/06/2020 1018   BILITOT 0.5 12/06/2020 1018   GFRNONAA >60 02/01/2020 1513   GFRAA >60 02/01/2020 1513       Component Value Date/Time   WBC 6.8 12/06/2020 1018   RBC 4.48 12/06/2020 1018  HGB 13.8 12/06/2020 1018   HCT 40.3 12/06/2020 1018   PLT 328.0 12/06/2020 1018   MCV 90.1 12/06/2020 1018   MCH 30.3 04/05/2020 1052   MCHC 34.3 12/06/2020 1018   RDW 13.4 12/06/2020 1018   LYMPHSABS 1.6 12/06/2020 1018   MONOABS 0.3 12/06/2020 1018   EOSABS 0.2 12/06/2020 1018   BASOSABS 0.1 12/06/2020 1018    No results found for: POCLITH, LITHIUM   No results found for: PHENYTOIN, PHENOBARB, VALPROATE, CBMZ   .res Assessment: Plan:   Generalized anxiety disorder, manageable  Attention deficit hyperactivity disorder improved with duloxetine  History of panic attacks in remission with current meds  Wants to stay on the Cymbalta instead of the Lexapro.  Thought Lexapro started causing anxiety problems but has been more stressed lately.  She failed attempted transition from alprazolam to  lorazepam in October 2020.  It appears she had some withdrawal symptoms and she is gone back to alprazolam.  It was discussed with her that we could do the transition more gradually if she wanted to pursue that to see if she can get cognitive benefit with the switch.  She does not want to do that at this time. Continue Xanax 0.5 mg QID Disc her fears of withdrawal and med shortage.  Disc ways to taper if she wants.  We discussed the short-term risks associated with benzodiazepines including sedation and increased fall risk among others.  Discussed long-term side effect risk including dependence, potential withdrawal symptoms, and the potential eventual dose-related risk of dementia.  But recent studies from 2020 dispute this association between benzodiazepines and dementia risk. Newer studies in 2020 do not support an association with dementia.   Has started counseling at [facility] as scheduled  Meredith Staggers, MD, DFAPA   Please see After Visit Summary for patient specific instructions.  Future Appointments  Date Time Provider Department Center  12/20/2020  8:40 AM Laury Axon Irish Elders, DO [facility]  02/11/2021  9:15 AM Cottle, Steva Ready., MD [facility] None    No orders of the defined types were placed in this encounter.     -------------------------------

## 2020-12-20 ENCOUNTER — Ambulatory Visit (INDEPENDENT_AMBULATORY_CARE_PROVIDER_SITE_OTHER): Payer: Self-pay | Admitting: Family Medicine

## 2020-12-20 ENCOUNTER — Other Ambulatory Visit: Payer: Self-pay

## 2020-12-20 ENCOUNTER — Telehealth: Payer: Self-pay | Admitting: Family Medicine

## 2020-12-20 ENCOUNTER — Encounter: Payer: Self-pay | Admitting: Family Medicine

## 2020-12-20 VITALS — BP 110/70 | HR 78 | Temp 98.9°F | Resp 18 | Ht 66.0 in | Wt 213.4 lb

## 2020-12-20 DIAGNOSIS — I10 Essential (primary) hypertension: Secondary | ICD-10-CM

## 2020-12-20 DIAGNOSIS — F419 Anxiety disorder, unspecified: Secondary | ICD-10-CM

## 2020-12-20 MED ORDER — HYDROXYZINE PAMOATE 25 MG PO CAPS: ORAL_CAPSULE | ORAL | 5 refills | Status: DC

## 2020-12-20 NOTE — Telephone Encounter (Signed)
Is this okay to take like this?

## 2020-12-20 NOTE — Patient Instructions (Signed)
https://www.nhlbi.nih.gov/files/docs/public/heart/dash_brief.pdf">  DASH Eating Plan DASH stands for Dietary Approaches to Stop Hypertension. The DASH eating plan is a healthy eating plan that has been shown to: Reduce high blood pressure (hypertension). Reduce your risk for type 2 diabetes, heart disease, and stroke. Help with weight loss. What are tips for following this plan? Reading food labels Check food labels for the amount of salt (sodium) per serving. Choose foods with less than 5 percent of the Daily Value of sodium. Generally, foods with less than 300 milligrams (mg) of sodium per serving fit into this eating plan. To find whole grains, look for the word "whole" as the first word in the ingredient list. Shopping Buy products labeled as "low-sodium" or "no salt added." Buy fresh foods. Avoid canned foods and pre-made or frozen meals. Cooking Avoid adding salt when cooking. Use salt-free seasonings or herbs instead of table salt or sea salt. Check with your health care provider or pharmacist before using salt substitutes. Do not fry foods. Cook foods using healthy methods such as baking, boiling, grilling, roasting, and broiling instead. Cook with heart-healthy oils, such as olive, canola, avocado, soybean, or sunflower oil. Meal planning  Eat a balanced diet that includes: 4 or more servings of fruits and 4 or more servings of vegetables each day. Try to fill one-half of your plate with fruits and vegetables. 6-8 servings of whole grains each day. Less than 6 oz (170 g) of lean meat, poultry, or fish each day. A 3-oz (85-g) serving of meat is about the same size as a deck of cards. One egg equals 1 oz (28 g). 2-3 servings of low-fat dairy each day. One serving is 1 cup (237 mL). 1 serving of nuts, seeds, or beans 5 times each week. 2-3 servings of heart-healthy fats. Healthy fats called omega-3 fatty acids are found in foods such as walnuts, flaxseeds, fortified milks, and eggs.  These fats are also found in cold-water fish, such as sardines, salmon, and mackerel. Limit how much you eat of: Canned or prepackaged foods. Food that is high in trans fat, such as some fried foods. Food that is high in saturated fat, such as fatty meat. Desserts and other sweets, sugary drinks, and other foods with added sugar. Full-fat dairy products. Do not salt foods before eating. Do not eat more than 4 egg yolks a week. Try to eat at least 2 vegetarian meals a week. Eat more home-cooked food and less restaurant, buffet, and fast food.  Lifestyle When eating at a restaurant, ask that your food be prepared with less salt or no salt, if possible. If you drink alcohol: Limit how much you use to: 0-1 drink a day for women who are not pregnant. 0-2 drinks a day for men. Be aware of how much alcohol is in your drink. In the [place] one drink equals one 12 oz bottle of beer (355 mL), one 5 oz glass of wine (148 mL), or one 1 oz glass of hard liquor (44 mL). General information Avoid eating more than 2,300 mg of salt a day. If you have hypertension, you may need to reduce your sodium intake to 1,500 mg a day. Work with your health care provider to maintain a healthy body weight or to lose weight. Ask what an ideal weight is for you. Get at least 30 minutes of exercise that causes your heart to beat faster (aerobic exercise) most days of the week. Activities may include walking, swimming, or biking. Work with your health care provider   or dietitian to adjust your eating plan to your individual calorie needs. What foods should I eat? Fruits All fresh, dried, or frozen fruit. Canned fruit in natural juice (without addedsugar). Vegetables Fresh or frozen vegetables (raw, steamed, roasted, or grilled). Low-sodium or reduced-sodium tomato and vegetable juice. Low-sodium or reduced-sodium tomatosauce and tomato paste. Low-sodium or reduced-sodium canned vegetables. Grains Whole-grain or  whole-wheat bread. Whole-grain or whole-wheat pasta. Brown rice. Oatmeal. Quinoa. Bulgur. Whole-grain and low-sodium cereals. Pita bread.Low-fat, low-sodium crackers. Whole-wheat flour tortillas. Meats and other proteins Skinless chicken or turkey. Ground chicken or turkey. Pork with fat trimmed off. Fish and seafood. Egg whites. Dried beans, peas, or lentils. Unsalted nuts, nut butters, and seeds. Unsalted canned beans. Lean cuts of beef with fat trimmed off. Low-sodium, lean precooked or cured meat, such as sausages or meatloaves. Dairy Low-fat (1%) or fat-free (skim) milk. Reduced-fat, low-fat, or fat-free cheeses. Nonfat, low-sodium ricotta or cottage cheese. Low-fat or nonfatyogurt. Low-fat, low-sodium cheese. Fats and oils Soft margarine without trans fats. Vegetable oil. Reduced-fat, low-fat, or light mayonnaise and salad dressings (reduced-sodium). Canola, safflower, olive, avocado, soybean, andsunflower oils. Avocado. Seasonings and condiments Herbs. Spices. Seasoning mixes without salt. Other foods Unsalted popcorn and pretzels. Fat-free sweets. The items listed above may not be a complete list of foods and beverages you can eat. Contact a dietitian for more information. What foods should I avoid? Fruits Canned fruit in a light or heavy syrup. Fried fruit. Fruit in cream or buttersauce. Vegetables Creamed or fried vegetables. Vegetables in a cheese sauce. Regular canned vegetables (not low-sodium or reduced-sodium). Regular canned tomato sauce and paste (not low-sodium or reduced-sodium). Regular tomato and vegetable juice(not low-sodium or reduced-sodium). Pickles. Olives. Grains Baked goods made with fat, such as croissants, muffins, or some breads. Drypasta or rice meal packs. Meats and other proteins Fatty cuts of meat. Ribs. Fried meat. Bacon. Bologna, salami, and other precooked or cured meats, such as sausages or meat loaves. Fat from the back of a pig (fatback). Bratwurst.  Salted nuts and seeds. Canned beans with added salt. Canned orsmoked fish. Whole eggs or egg yolks. Chicken or turkey with skin. Dairy Whole or 2% milk, cream, and half-and-half. Whole or full-fat cream cheese. Whole-fat or sweetened yogurt. Full-fat cheese. Nondairy creamers. Whippedtoppings. Processed cheese and cheese spreads. Fats and oils Butter. Stick margarine. Lard. Shortening. Ghee. Bacon fat. Tropical oils, suchas coconut, palm kernel, or palm oil. Seasonings and condiments Onion salt, garlic salt, seasoned salt, table salt, and sea salt. Worcestershire sauce. Tartar sauce. Barbecue sauce. Teriyaki sauce. Soy sauce, including reduced-sodium. Steak sauce. Canned and packaged gravies. Fish sauce. Oyster sauce. Cocktail sauce. Store-bought horseradish. Ketchup. Mustard. Meat flavorings and tenderizers. Bouillon cubes. Hot sauces. Pre-made or packaged marinades. Pre-made or packaged taco seasonings. Relishes. Regular saladdressings. Other foods Salted popcorn and pretzels. The items listed above may not be a complete list of foods and beverages you should avoid. Contact a dietitian for more information. Where to find more information National Heart, Lung, and Blood Institute: www.nhlbi.nih.gov American Heart Association: www.heart.org Academy of Nutrition and Dietetics: www.eatright.org National Kidney Foundation: www.kidney.org Summary The DASH eating plan is a healthy eating plan that has been shown to reduce high blood pressure (hypertension). It may also reduce your risk for type 2 diabetes, heart disease, and stroke. When on the DASH eating plan, aim to eat more fresh fruits and vegetables, whole grains, lean proteins, low-fat dairy, and heart-healthy fats. With the DASH eating plan, you should limit salt (sodium) intake to 2,300   mg a day. If you have hypertension, you may need to reduce your sodium intake to 1,500 mg a day. Work with your health care provider or dietitian to adjust  your eating plan to your individual calorie needs. This information is not intended to replace advice given to you by your health care provider. Make sure you discuss any questions you have with your healthcare provider. Document Revised: 05/26/2019 Document Reviewed: 05/26/2019 Elsevier Patient Education  2022 Elsevier Inc.  

## 2020-12-20 NOTE — Telephone Encounter (Signed)
Patient states prescription should be one a morning and one a night please re-send  hydrOXYzine (VISTARIL) 25 MG capsule [[id number]]   [id number] - [place], [place] - [address]  [address], [place] [address]  Phone:  [phone]  Fax:  [phone]

## 2020-12-20 NOTE — Assessment & Plan Note (Signed)
Well controlled, no changes to meds. Encouraged heart healthy diet such as the DASH diet and exercise as tolerated.  Running low--- stop norvasc F/u 3 months or sooner prn

## 2020-12-20 NOTE — Telephone Encounter (Signed)
New rx sent

## 2020-12-20 NOTE — Progress Notes (Signed)
Patient ID: Jenna Arroyo, female    DOB: 1966/03/19  Age: 55 y.o. MRN: [id number]    Subjective:  Subjective  HPI Jenna Arroyo presents for an office visit and follow up for BP today. She reports that she feels well and has no complaints today. She states that she has not been taking 2.5 mg of amlodipine PO Daily for her dx of HTN. She reports that she has taken 25 mg of atenolol PO Daily and 300 mg of irbesartan PO Daily for her dx of HTN this morning. She notes that her at home BP this morning is 113/73. She stopped the norvasc due to bp running low.   BP Readings from Last 3 Encounters:  12/20/20 110/70  12/06/20 100/70  10/15/20 122/70  She notes that she had lost weight.  Wt Readings from Last 3 Encounters:  12/20/20 213 lb 6.4 oz (96.8 kg)  12/06/20 215 lb (97.5 kg)  10/15/20 218 lb (98.9 kg)   She denies any chest pain, SOB, fever, abdominal pain, cough, chills, sore throat, dysuria, urinary incontinence, back pain, HA, or N/V/D at this time.   Review of Systems  Constitutional:  Negative for chills, fatigue and fever.  HENT:  Negative for ear pain, rhinorrhea, sinus pressure, sinus pain, sore throat and tinnitus.   Eyes:  Negative for pain.  Respiratory:  Negative for cough, shortness of breath and wheezing.   Cardiovascular:  Negative for chest pain.  Gastrointestinal:  Negative for abdominal pain, anal bleeding, constipation, diarrhea, nausea and vomiting.  Genitourinary:  Negative for flank pain.  Musculoskeletal:  Negative for back pain and neck pain.  Skin:  Negative for rash.  Neurological:  Negative for seizures, weakness, light-headedness, numbness and headaches.   History Past Medical History:  Diagnosis Date   Anxiety    High cholesterol    Hypertension    Lyme disease    Thyroid disease     She has a past surgical history that includes Tonsilectomy/adenoidectomy with myringotomy; Mastoidectomy; Appendectomy; and Abdominal exploration surgery.   Her family  history includes Heart disease in her father; High Cholesterol in her brother, father, mother, and sister; Hypertension in her brother, mother, and sister.She reports that she quit smoking about 16 years ago. She has never used smokeless tobacco. She reports that she does not drink alcohol and does not use drugs.  Current Outpatient Medications on File Prior to Visit  Medication Sig Dispense Refill   ALPRAZolam (XANAX) 0.5 MG tablet TAKE ONE TABLET BY MOUTH FOUR TIMES A DAY (ONE TABLET EVERY MORNING, ONE TABLET AT NOON, ONE TABLET IN THE EVENING, AND ONE TABLET AT BEDTIME) 120 tablet 5   ARMOUR THYROID 15 MG tablet Take 3 tablets (45 mg total) by mouth daily. 90 tablet 3   atenolol (TENORMIN) 25 MG tablet Take 1 tablet by mouth twice daily 60 tablet 3   Cholecalciferol (VITAMIN D) 10 MCG/ML LIQD Take by mouth.     DULoxetine (CYMBALTA) 20 MG capsule Take 1 capsule (20 mg total) by mouth daily. 90 capsule 1   ELDERBERRY PO Take by mouth.     hydrochlorothiazide (MICROZIDE) 12.5 MG capsule Take 12.5 mg by mouth daily.     irbesartan (AVAPRO) 300 MG tablet Take 1 tablet (300 mg total) by mouth daily. 30 tablet 2   montelukast (SINGULAIR) 10 MG tablet Take 1 tablet (10 mg total) by mouth at bedtime. 30 tablet 0   pantoprazole (PROTONIX) 40 MG tablet Take 1 tablet (40 mg total)  by mouth daily. 90 tablet 3   PREBIOTIC PRODUCT PO Take by mouth. Mix in shake and drink once a day.     Vitamin D, Ergocalciferol, (DRISDOL) 1.25 MG (50000 UNIT) CAPS capsule Take 1 capsule (50,000 Units total) by mouth every 7 (seven) days. 12 capsule 0   No current facility-administered medications on file prior to visit.     Objective:  Objective  Physical Exam Vitals and nursing note reviewed.  Constitutional:      General: She is not in acute distress.    Appearance: Normal appearance. She is well-developed. She is not ill-appearing.  HENT:     Head: Normocephalic and atraumatic.     Right Ear: External ear  normal.     Left Ear: External ear normal.     Nose: Nose normal.  Eyes:     General:        Right eye: No discharge.        Left eye: No discharge.     Extraocular Movements: Extraocular movements intact.     Pupils: Pupils are equal, round, and reactive to light.  Cardiovascular:     Rate and Rhythm: Normal rate and regular rhythm.     Pulses: Normal pulses.     Heart sounds: Normal heart sounds. No murmur heard.   No friction rub. No gallop.  Pulmonary:     Effort: Pulmonary effort is normal. No respiratory distress.     Breath sounds: Normal breath sounds. No stridor. No wheezing, rhonchi or rales.  Chest:     Chest wall: No tenderness.  Abdominal:     General: Bowel sounds are normal. There is no distension.     Palpations: Abdomen is soft. There is no mass.     Tenderness: There is no abdominal tenderness. There is no guarding or rebound.     Hernia: No hernia is present.  Musculoskeletal:        General: Normal range of motion.     Cervical back: Normal range of motion and neck supple.     Right lower leg: No edema.     Left lower leg: No edema.  Skin:    General: Skin is warm and dry.  Neurological:     Mental Status: She is alert and oriented to person, place, and time.  Psychiatric:        Behavior: Behavior normal.        Thought Content: Thought content normal.   BP 110/70 (BP Location: Left Arm, Patient Position: Sitting, Cuff Size: Large)   Pulse 78   Temp 98.9 F (37.2 C)   Resp 18   Ht 5\' 6"  (1.676 m)   Wt 213 lb 6.4 oz (96.8 kg)   SpO2 97%   BMI 34.44 kg/m  Wt Readings from Last 3 Encounters:  12/20/20 213 lb 6.4 oz (96.8 kg)  12/06/20 215 lb (97.5 kg)  10/15/20 218 lb (98.9 kg)     Lab Results  Component Value Date   WBC 6.8 12/06/2020   HGB 13.8 12/06/2020   HCT 40.3 12/06/2020   PLT 328.0 12/06/2020   GLUCOSE 80 12/06/2020   CHOL 292 (H) 10/15/2020   TRIG 181.0 (H) 10/15/2020   HDL 66.80 10/15/2020   LDLCALC 189 (H) 10/15/2020   ALT  19 12/06/2020   AST 16 12/06/2020   NA 140 12/06/2020   K 4.2 12/06/2020   CL 101 12/06/2020   CREATININE 1.01 12/06/2020   BUN 10 12/06/2020   CO2 29  12/06/2020   TSH 1.60 12/06/2020   MICROALBUR <0.7 12/11/2019    [place] AXILLA LEFT  Result Date: 06/19/2020 CLINICAL DATA:  55 year old with chronic Lyme disease, presenting with diffuse BILATERAL breast pain and possible palpable lumps in both axilla. EXAM: DIGITAL DIAGNOSTIC BILATERAL MAMMOGRAM WITH CAD AND TOMO ULTRASOUND BILATERAL AXILLAE COMPARISON:  Previous exam(s). ACR Breast Density Category b: There are scattered areas of fibroglandular density. FINDINGS: Tomosynthesis and synthesized full field CC and MLO views of both breasts were obtained. Tomosynthesis and synthesized spot tangential views of the areas of palpable concern in both axillae were also obtained. Mammographic images were processed with CAD. RIGHT: Asymmetric focally dense normal fibroglandular tissue in the UPPER breast at POSTERIOR depth, unchanged since the 2019 mammogram, confirming benignity. No visible mass or lymphadenopathy in the RIGHT axilla. No findings suspicious for malignancy. Targeted RIGHT axillary ultrasound is performed, showing normal fibrofatty tissue. There is no pathologic lymphadenopathy. The triceps muscle is present in the area of palpable concern. LEFT: No findings suspicious for malignancy. No visible mass or lymphadenopathy in the LEFT axilla. Targeted LEFT axillary ultrasound is performed, showing normal fibrofatty tissue. There is no pathologic lymphadenopathy. Again, the triceps muscle is present in the area of palpable concern. On correlative physical exam, there is palpable tissue in the upper axilla bilaterally, and on ultrasound, these palpable tissues correspond to the triceps muscle. IMPRESSION: 1. No mammographic evidence of malignancy involving either breast. 2. No pathologic lymphadenopathy involving either axilla. RECOMMENDATION: Screening  mammogram in one year.(Code:SM-B-01Y) I have discussed the findings and recommendations with the patient. If applicable, a reminder letter will be sent to the patient regarding the next appointment. BI-RADS CATEGORY  1: Negative. Electronically Signed   By: Hulan Saas M.D.   On: 06/19/2020 16:27   [place] AXILLA RIGHT  Result Date: 06/19/2020 CLINICAL DATA:  55 year old with chronic Lyme disease, presenting with diffuse BILATERAL breast pain and possible palpable lumps in both axilla. EXAM: DIGITAL DIAGNOSTIC BILATERAL MAMMOGRAM WITH CAD AND TOMO ULTRASOUND BILATERAL AXILLAE COMPARISON:  Previous exam(s). ACR Breast Density Category b: There are scattered areas of fibroglandular density. FINDINGS: Tomosynthesis and synthesized full field CC and MLO views of both breasts were obtained. Tomosynthesis and synthesized spot tangential views of the areas of palpable concern in both axillae were also obtained. Mammographic images were processed with CAD. RIGHT: Asymmetric focally dense normal fibroglandular tissue in the UPPER breast at POSTERIOR depth, unchanged since the 2019 mammogram, confirming benignity. No visible mass or lymphadenopathy in the RIGHT axilla. No findings suspicious for malignancy. Targeted RIGHT axillary ultrasound is performed, showing normal fibrofatty tissue. There is no pathologic lymphadenopathy. The triceps muscle is present in the area of palpable concern. LEFT: No findings suspicious for malignancy. No visible mass or lymphadenopathy in the LEFT axilla. Targeted LEFT axillary ultrasound is performed, showing normal fibrofatty tissue. There is no pathologic lymphadenopathy. Again, the triceps muscle is present in the area of palpable concern. On correlative physical exam, there is palpable tissue in the upper axilla bilaterally, and on ultrasound, these palpable tissues correspond to the triceps muscle. IMPRESSION: 1. No mammographic evidence of malignancy involving either breast. 2. No  pathologic lymphadenopathy involving either axilla. RECOMMENDATION: Screening mammogram in one year.(Code:SM-B-01Y) I have discussed the findings and recommendations with the patient. If applicable, a reminder letter will be sent to the patient regarding the next appointment. BI-RADS CATEGORY  1: Negative. Electronically Signed   By: Hulan Saas M.D.   On: 06/19/2020 16:27   MS DIGITAL  DIAG TOMO BILAT  Result Date: 06/19/2020 CLINICAL DATA:  55 year old with chronic Lyme disease, presenting with diffuse BILATERAL breast pain and possible palpable lumps in both axilla. EXAM: DIGITAL DIAGNOSTIC BILATERAL MAMMOGRAM WITH CAD AND TOMO ULTRASOUND BILATERAL AXILLAE COMPARISON:  Previous exam(s). ACR Breast Density Category b: There are scattered areas of fibroglandular density. FINDINGS: Tomosynthesis and synthesized full field CC and MLO views of both breasts were obtained. Tomosynthesis and synthesized spot tangential views of the areas of palpable concern in both axillae were also obtained. Mammographic images were processed with CAD. RIGHT: Asymmetric focally dense normal fibroglandular tissue in the UPPER breast at POSTERIOR depth, unchanged since the 2019 mammogram, confirming benignity. No visible mass or lymphadenopathy in the RIGHT axilla. No findings suspicious for malignancy. Targeted RIGHT axillary ultrasound is performed, showing normal fibrofatty tissue. There is no pathologic lymphadenopathy. The triceps muscle is present in the area of palpable concern. LEFT: No findings suspicious for malignancy. No visible mass or lymphadenopathy in the LEFT axilla. Targeted LEFT axillary ultrasound is performed, showing normal fibrofatty tissue. There is no pathologic lymphadenopathy. Again, the triceps muscle is present in the area of palpable concern. On correlative physical exam, there is palpable tissue in the upper axilla bilaterally, and on ultrasound, these palpable tissues correspond to the triceps  muscle. IMPRESSION: 1. No mammographic evidence of malignancy involving either breast. 2. No pathologic lymphadenopathy involving either axilla. RECOMMENDATION: Screening mammogram in one year.(Code:SM-B-01Y) I have discussed the findings and recommendations with the patient. If applicable, a reminder letter will be sent to the patient regarding the next appointment. BI-RADS CATEGORY  1: Negative. Electronically Signed   By: Hulan Saashomas  Lawrence M.D.   On: 06/19/2020 16:27     Assessment & Plan:  Plan    Meds ordered this encounter  Medications   hydrOXYzine (VISTARIL) 25 MG capsule    Sig: TAKE ONE CAPSULE BY MOUTH ONE TIME DAILY AT BEDTIME AS NEEDED    Dispense:  30 capsule    Refill:  5    Problem List Items Addressed This Visit       Unprioritized   Essential hypertension    Well controlled, no changes to meds. Encouraged heart healthy diet such as the DASH diet and exercise as tolerated.  Running low--- stop norvasc F/u 3 months or sooner prn        Other Visit Diagnoses     Primary hypertension    -  Primary   Anxiety       Relevant Medications   hydrOXYzine (VISTARIL) 25 MG capsule       Follow-up: Return in about 3 months (around 03/22/2021), or if symptoms worsen or fail to improve, for hypertension.   I,Gordon Zheng,acting as a Neurosurgeonscribe for [employer] ScientificYvonne R Lowne Chase, DO.,have documented all relevant documentation on the behalf of Donato SchultzYvonne R Lowne Chase, DO,as directed by  Donato SchultzYvonne R Lowne Chase, DO while in the presence of Donato SchultzYvonne R Lowne Chase, DO.   I, Donato SchultzYvonne R Lowne Chase, DO, have reviewed all documentation for this visit. The documentation on 12/20/20 for the exam, diagnosis, procedures, and orders are all accurate and complete.

## 2020-12-25 ENCOUNTER — Telehealth: Payer: Self-pay | Admitting: Family Medicine

## 2020-12-25 ENCOUNTER — Other Ambulatory Visit: Payer: Self-pay

## 2020-12-25 ENCOUNTER — Other Ambulatory Visit: Payer: Self-pay | Admitting: Family Medicine

## 2020-12-25 DIAGNOSIS — I10 Essential (primary) hypertension: Secondary | ICD-10-CM

## 2020-12-25 NOTE — Telephone Encounter (Signed)
Medication: irbesartan (AVAPRO) 300 MG tablet [[id number]]     Has the patient contacted their pharmacy? no (If no, request that the patient contact the pharmacy for the refill.) (If yes, when and what did the pharmacy advise?)    Preferred Pharmacy (with phone number or street name):  [id number] - [place], [place] - [address] Phone:  [phone]  Fax:  [phone]        Agent: Please be advised that RX refills may take up to 3 business days. We ask that you follow-up with your pharmacy.

## 2020-12-25 NOTE — Telephone Encounter (Signed)
Rx sent 

## 2020-12-30 ENCOUNTER — Telehealth: Payer: Self-pay

## 2020-12-30 NOTE — Telephone Encounter (Signed)
Patient Name: Jenna Arroyo Gender: Female DOB: 1966-01-28 Age: 55 Y 4 M 19 D Return Phone Number: [phone] Client [name] Night - Client Client Site [facility] - Night Physician Seabron Spates- MD Contact Type Call Who Is Calling Patient / Member / Family / Caregiver Call Type Triage / Clinical Relationship To Patient Self Return Phone Number [phone] (Primary) Chief Complaint Blood Pressure Low Reason for Call Symptomatic / Request for Health Information Initial Comment Caller states that her blood pressure is low. Caller states that she felt tired, 102/72 is her blood pressure. Translation No Nurse Assessment Nurse: Rennis Petty, RN, Clydie Braun Date/Time [name]: 12/29/2020 5:17:05 PM Confirm and document reason for call. If symptomatic, describe symptoms. ---Caller states that her blood pressure is low. Caller states that she felt tired, 102/72 is her blood pressure. States has been doing this since took a med that starts with an "A". It's usually 128-130/84-94. It's been 118/85 and sometimes creeps up.

## 2020-12-30 NOTE — Telephone Encounter (Signed)
See below

## 2020-12-31 ENCOUNTER — Other Ambulatory Visit: Payer: Self-pay | Admitting: Family Medicine

## 2021-01-01 NOTE — Telephone Encounter (Signed)
Medication on med list under historical provider. Please advise on refill.

## 2021-01-01 NOTE — Telephone Encounter (Signed)
Called pt. Lvm stating she should schedule a ov per lowne

## 2021-01-02 ENCOUNTER — Other Ambulatory Visit: Payer: Self-pay | Admitting: Family Medicine

## 2021-01-21 ENCOUNTER — Telehealth (INDEPENDENT_AMBULATORY_CARE_PROVIDER_SITE_OTHER): Payer: Self-pay | Admitting: Internal Medicine

## 2021-01-21 VITALS — BP 107/79 | HR 67

## 2021-01-21 DIAGNOSIS — J069 Acute upper respiratory infection, unspecified: Secondary | ICD-10-CM

## 2021-01-21 NOTE — Progress Notes (Signed)
Subjective:    Patient ID: Jenna Arroyo, female    DOB: [birth year], 55 y.o.   MRN: [id number]  DOS:  01/21/2021 Type of visit - description: Virtual Visit via Video Note  I connected with the above patient  by a video enabled telemedicine application and verified that I am speaking with the correct person using two identifiers.   THIS ENCOUNTER IS A VIRTUAL VISIT DUE TO COVID-19 - PATIENT WAS NOT SEEN IN THE OFFICE. PATIENT HAS CONSENTED TO VIRTUAL VISIT / TELEMEDICINE VISIT   Location of patient: home  Location of provider: office  Persons participating in the virtual visit: patient, provider   I discussed the limitations of evaluation and management by telemedicine and the availability of in person appointments. The patient expressed understanding and agreed to proceed.  Acute Symptoms a started a week ago with sore throat and some postnasal dripping. A coworker had allergy type of symptoms. Patient  tested negative for COVID last week. She is calling because she still has some sore throat but overall is improved. Has not taken any OTCs.  Denies fever chills No nausea or vomiting No chest or sinus congestion.  Review of Systems See above   Past Medical History:  Diagnosis Date   Anxiety    High cholesterol    Hypertension    Lyme disease    Thyroid disease     Past Surgical History:  Procedure Laterality Date   ABDOMINAL EXPLORATION SURGERY     APPENDECTOMY     MASTOIDECTOMY     TONSILECTOMY/ADENOIDECTOMY WITH MYRINGOTOMY      Allergies as of 01/21/2021       Reactions   Ceftriaxone Sodium In Dextrose Other (See Comments)   "heart races and cannot breathe"   Guaifenesin & Derivatives Other (See Comments)   "heart races, dizziness, light-headedness"   Meperidine Other (See Comments)   "heart races"   Other Rash   Penicillins Hives   Pseudoephedrine Other (See Comments)   "heart races and high blood pressure"   Rosuvastatin Calcium Nausea And Vomiting    Muscle aches    Simvastatin Other (See Comments)   muscle aching    Ciprofloxacin    Rocephin [ceftriaxone] Itching        Medication List        Accurate as of January 21, 2021  9:02 AM. If you have any questions, ask your nurse or doctor.          ALPRAZolam 0.5 MG tablet Commonly known as: XANAX TAKE ONE TABLET BY MOUTH FOUR TIMES A DAY (ONE TABLET EVERY MORNING, ONE TABLET AT NOON, ONE TABLET IN THE EVENING, AND ONE TABLET AT BEDTIME)   Armour Thyroid 15 MG tablet Generic drug: thyroid Take 3 tablets (45 mg total) by mouth daily.   atenolol 25 MG tablet Commonly known as: TENORMIN Take 1 tablet by mouth twice daily   DULoxetine 20 MG capsule Commonly known as: CYMBALTA Take 1 capsule (20 mg total) by mouth daily.   ELDERBERRY PO Take by mouth.   hydrochlorothiazide 12.5 MG capsule Commonly known as: MICROZIDE Take 1 capsule by mouth once daily   hydrOXYzine 25 MG capsule Commonly known as: VISTARIL TAKE ONE CAPSULE BY MOUTH TWICE TIME DAILY AS NEEDED   irbesartan 300 MG tablet Commonly known as: AVAPRO Take 1 tablet by mouth once daily   montelukast 10 MG tablet Commonly known as: SINGULAIR Take 1 tablet (10 mg total) by mouth at bedtime.   pantoprazole 40 MG  tablet Commonly known as: PROTONIX Take 1 tablet (40 mg total) by mouth daily.   PREBIOTIC PRODUCT PO Take by mouth. Mix in shake and drink once a day.   Vitamin D (Ergocalciferol) 1.25 MG (50000 UNIT) Caps capsule Commonly known as: DRISDOL Take 1 capsule (50,000 Units total) by mouth every 7 (seven) days.   Vitamin D 10 MCG/ML Liqd Take by mouth.           Objective:   Physical Exam BP 107/79 (BP Location: Left Arm, Patient Position: Sitting, Cuff Size: Normal)   Pulse 67  This is a virtual video visit, she looks well, in no distress.    Assessment     55 year old female, PMH includes HTN, hypothyroidism, GERD, multiple medication allergies, unvaccinated against COVID, presents  with: URI: Presents with sore throat for 1 week, overall she feels better, she is afebrile, COVID test negative.  No fever chills. Suspect this will be a self-limited illness, recommend rest, fluids, Tylenol. Call if not gradually better.     I discussed the assessment and treatment plan with the patient. The patient was provided an opportunity to ask questions and all were answered. The patient agreed with the plan and demonstrated an understanding of the instructions.   The patient was advised to call back or seek an in-person evaluation if the symptoms worsen or if the condition fails to improve as anticipated.

## 2021-01-24 ENCOUNTER — Other Ambulatory Visit: Payer: Self-pay | Admitting: Family Medicine

## 2021-01-24 ENCOUNTER — Telehealth: Payer: Self-pay

## 2021-01-24 DIAGNOSIS — I10 Essential (primary) hypertension: Secondary | ICD-10-CM

## 2021-01-24 NOTE — Telephone Encounter (Signed)
Patient called back , appointment made for 01/30/21 and refilled bp meds

## 2021-01-24 NOTE — Telephone Encounter (Signed)
Nurse Assessment Nurse: Lucretia Roers, RN, Fuller Song Date/Time (Eastern Time): 01/23/2021 6:09:15 PM Confirm and document reason for call. If symptomatic, describe symptoms. ---Caller states her blood pressure has gone up and has been extremely stressed. It is 145-108. irbesartan 300mg , atenolol 25mg  BID. Just took her BP meds. Does the patient have any new or worsening symptoms? ---Yes Will a triage be completed? ---Yes Related visit to physician within the last 2 weeks? ---No Does the PT have any chronic conditions? (i.e. diabetes, asthma, this includes High risk factors for pregnancy, etc.) ---Yes List chronic conditions. ---Lyme disease, hashimoto Is this a behavioral health or substance abuse call? ---No Guidelines Guideline Title Affirmed Question Affirmed Notes Nurse Date/Time (Eastern Time) Blood Pressure - High [1] Systolic BP >= 130 OR Diastolic >= 80 AND [2] taking BP medications 01/23/2021 6:12:45 PM Disp. Time Richrd Sox Time) Disposition Final User 01/23/2021 6:24:05 PM See PCP within 2 Weeks Yes [name], RN, 01/25/2021 PLEASE NOTE: All timestamps contained within this report are represented as Lucretia Roers Standard Time. CONFIDENTIALTY NOTICE: This fax transmission is intended only for the addressee. It contains information that is legally privileged, confidential or otherwise protected from use or disclosure. If you are not the intended recipient, you are strictly prohibited from reviewing, disclosing, copying using or disseminating any of this information or taking any action in reliance on or regarding this information. If you have received this fax in error, please notify Fuller Song immediately by telephone so that we can arrange for its return to [place]. Phone: [phone], Toll-Free: [phone], Fax: [phone] Page: 2 of 2 Call Id: [phone] Caller Disagree/Comply Comply Caller Understands Yes PreDisposition Call Doctor Care Advice Given Per Guideline SEE PCP WITHIN 2  WEEKS: * You need to be seen for this ongoing problem within the next 2 weeks. * You should see your doctor and have your blood pressure checked within 2 weeks. * EAT HEALTHY: Eat a diet rich in fresh fruits and vegetables, dietary fiber, non-animal protein (e.g., soy), and low-fat dairy products. Avoid foods with a high content of saturated fat or cholesterol. * LIMIT ALCOHOL: Limit alcoholic beverages to no more than one drink per day for women and no more than two drinks for men. A drink is 1.5 oz hard liquor (one shot or jigger; 45 ml), 5 oz wine (small glass; 150 ml), 12 oz beer (one can; 360 ml). * REDUCE STRESS: Find activities that help reduce your stress. Examples might include meditation, yoga, or even a restful walk in a park. CALL BACK IF: * Weakness or numbness of the face, arm or leg on one side of the body occurs * Difficulty walking, difficulty talking, or severe headache occurs * Chest pain or difficulty breathing occurs * Your blood pressure is over 160/100 * You become worse CARE ADVICE given per High Blood Pressure (Adult) guideline

## 2021-01-26 ENCOUNTER — Encounter (HOSPITAL_BASED_OUTPATIENT_CLINIC_OR_DEPARTMENT_OTHER): Payer: Self-pay | Admitting: Emergency Medicine

## 2021-01-26 ENCOUNTER — Other Ambulatory Visit: Payer: Self-pay

## 2021-01-26 ENCOUNTER — Emergency Department (HOSPITAL_BASED_OUTPATIENT_CLINIC_OR_DEPARTMENT_OTHER)
Admission: EM | Admit: 2021-01-26 | Discharge: 2021-01-26 | Disposition: A | Discharge status: Home / Self Care | Payer: Self-pay | Attending: Emergency Medicine | Admitting: Emergency Medicine

## 2021-01-26 DIAGNOSIS — Z87891 Personal history of nicotine dependence: Secondary | ICD-10-CM | POA: Insufficient documentation

## 2021-01-26 DIAGNOSIS — Z79899 Other long term (current) drug therapy: Secondary | ICD-10-CM | POA: Insufficient documentation

## 2021-01-26 DIAGNOSIS — E039 Hypothyroidism, unspecified: Secondary | ICD-10-CM | POA: Insufficient documentation

## 2021-01-26 DIAGNOSIS — Z77098 Contact with and (suspected) exposure to other hazardous, chiefly nonmedicinal, chemicals: Secondary | ICD-10-CM | POA: Insufficient documentation

## 2021-01-26 DIAGNOSIS — R21 Rash and other nonspecific skin eruption: Secondary | ICD-10-CM | POA: Insufficient documentation

## 2021-01-26 DIAGNOSIS — I10 Essential (primary) hypertension: Secondary | ICD-10-CM | POA: Insufficient documentation

## 2021-01-26 NOTE — ED Triage Notes (Signed)
Pt c/o pain to bil feet from stepping in [employer]  cleaning solution yesterday

## 2021-01-26 NOTE — Discharge Instructions (Addendum)
Ibuprofen and Tylenol as needed for discomfort.  You can use aloe to help with skin irritation.  This should improve in the next few days.  I do not expect you develop any blistering at this point but if you do see any blisters it is important that she do not pop or open them as this increases the risk for infection.  Follow-up with your PCP for any new or worsening symptoms.
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
## 2021-01-27 ENCOUNTER — Encounter: Payer: Self-pay | Admitting: Family Medicine

## 2021-01-27 ENCOUNTER — Telehealth: Payer: Self-pay | Admitting: *Deleted

## 2021-01-27 ENCOUNTER — Ambulatory Visit (INDEPENDENT_AMBULATORY_CARE_PROVIDER_SITE_OTHER): Payer: Self-pay | Admitting: Family Medicine

## 2021-01-27 VITALS — BP 110/72 | HR 75 | Temp 98.9°F | Resp 18 | Ht 66.0 in | Wt 208.0 lb

## 2021-01-27 DIAGNOSIS — Z23 Encounter for immunization: Secondary | ICD-10-CM

## 2021-01-27 DIAGNOSIS — T25429D Corrosion of unspecified degree of unspecified foot, subsequent encounter: Secondary | ICD-10-CM

## 2021-01-27 MED ORDER — SILVER SULFADIAZINE 1 % EX CREA: 1.0000 "application " | TOPICAL_CREAM | Freq: Every day | CUTANEOUS | 0 refills | Status: DC
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
## 2021-01-27 NOTE — Telephone Encounter (Signed)
Patient made an appt for 01/30/21
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
## 2021-01-27 NOTE — Addendum Note (Signed)
Addended by: Wilford Corner on: 01/27/2021 01:15 PM   Modules accepted: Orders

## 2021-01-27 NOTE — Patient Instructions (Signed)
Chemical Burn, Adult A chemical burn is an injury to the skin. The structures below the skin can also be injured, such as the lungs and other internal organs. This type of burn is caused by coming into contact with a chemical that can damage and kill tissue (caustic chemical). Common caustic chemicals are found in fertilizers, household cleaners, and drain cleaners. A chemical burn can be more serious than other burns. Some chemicals continue to cause damage even after they have been removed from the skin. What are the causes? This condition is caused by swallowing, breathing in, touching, or being touched by a caustic chemical. What increases the risk? You are more likely to get a chemical burn if you work in a place where chemicals are made, stored, or used. These include working in manufacturing, medicine, farming, and mining. What are the signs or symptoms? Symptoms of this condition depend on the type of chemical that caused the burn and the way the chemical came in contact with the body. Symptoms may continue to get worse even after the chemical has been removed. Common symptoms include: Color changes of the skin. Your skin may lose color (blanch), turn red, or turn darker. Blistered skin. Rash. Dry, flaky skin. A type of acne (chloracne) that results from exposure to certain chemicals. Burning or aching pain. Itching. If the chemical is breathed in, or inhaled, symptoms include eye or nose irritation, sore throat, or coughing. If the chemical is swallowed (ingested) or absorbed into the body through a wound, it can damage: Organs, including the liver, kidneys, and bladder. The immune system, the nervous system, or the nose, throat, windpipe, and lungs (respiratory system). Later symptoms may include scarring, shrinking of the skin, and permanent change in skin color. How is this diagnosed? This condition is diagnosed with a medical history and physical exam. Your health care provider  will check how deep the burn is and how much of your skin surface it covers. You may be diagnosed with a: First-degree burn, if the burn only affects the outer layer of skin (epidermis). Second-degree burn, if the burn extends into the second layer of skin (dermis). Third-degree burn, if the burn extends through the dermis and into deeper tissue (hypodermis). Your blood pressure, heart rate, and urine output may also be measured. If the injury is severe, you may also have: Blood tests. A test to check the heart's electrical activity (electrocardiogram, or ECG). A chest X-ray. How is this treated? This condition may be treated by removing the caustic chemical. The skin will be washed or brushed to remove the chemical. Clothes will be removed if they have the chemical on them. After the chemical is removed, you may receive: Oxygen to help you breathe. Antibiotic medicine to fight infection. Pain medicine. Fluids through an IV. Bandages (dressings). A procedure to remove dead tissue (debridement). A tetanus shot. Long-term burn care may include: Breathing support. You may be given oxygen using a machine (ventilator). Frequent wound dressing changes. Antibiotics. Surgery, including debridement, skin graft, or repairing of damaged tissue or structures. Physical therapy. Follow these instructions at home: Medicines Take and apply over-the-counter and prescription medicines only as told by your health care provider. If you were prescribed an antibiotic medicine, take or apply it as told by your health care provider. Do not stop using the antibiotic even if you start to feel better. Burn care Follow instructions from your health care provider about how to take care of your burn, including: How to clean your   burn. When and how you should remove or change your dressing. Check your burn every day for signs of infection. Check for: More redness, swelling, or pain. Fluid or blood. Warmth. Pus  or a bad smell. Keep the dressing dry until your health care provider says it can be removed. Do not take baths, swim, use a hot tub, or do anything that would put your burn underwater until your health care provider approves. Ask your health care provider if you may take showers. You may only be allowed to take sponge baths. Do not put ice on your burn. This can cause more damage. Activity Rest as told by your health care provider. Do not exercise until your health care provider approves. Do range-of-motion movements, if told by your health care provider. General instructions Raise (elevate) the injured area above the level of your heart while you are sitting or lying down. Do not scratch or pick at the burn. Do not break any blisters you may have. Do not peel any skin. Protect your burn from the sun. Drink enough fluid to keep your urine pale yellow. Do not put butter, oil, or other home remedies on your burn. This can lead to an infection. Do not use any products that contain nicotine or tobacco, such as cigarettes, e-cigarettes, and chewing tobacco. These can delay healing. If you need help quitting, ask your health care provider. Keep all follow-up visits as told by your health care provider. This is important. How is this prevented? Avoid exposure to caustic chemicals. Wear protective gloves and equipment when you handle caustic chemicals. Make sure all caustic chemicals are labeled. Talk to your employer about the caustic chemicals that they use. Ask whether those can be replaced with chemicals that are less harmful. Make sure there is proper airflow (ventilation) in any area with caustic chemicals. Keep your skin clean and moisturized. Dry skin is more likely to be damaged by chemicals. Contact a health care provider if: You received a tetanus shot and you have any of the following symptoms at the injection site: Swelling. Severe pain. Redness. Bleeding. Your symptoms do not  improve with treatment. Your pain is not controlled with medicine. You have more redness, swelling, or pain around your burn. Your burn feels warm to the touch. Get help right away if you: Develop any signs of infection such as: Red streaks near the burn. Fluid, blood, or pus coming from the burn. A bad smell coming from your burn. Develop severe swelling. Develop severe pain. Have a fever. Have numbness or tingling in the burned area or farther down your legs or arms. Have trouble breathing, or you develop coughing or noisy breathing (wheezing). Have chest pain. Summary A chemical burn is an injury to the skin that is caused by a caustic chemical. Some chemicals continue to cause damage even after they have been removed from the skin. This condition is more likely to develop in people who are exposed to chemicals at work. Avoid exposure to caustic chemicals that can cause burns. Wear protective gloves and equipment when you handle dangerous chemicals. This information is not intended to replace advice given to you by your health care provider. Make sure you discuss any questions you have with your health care provider. Document Revised: 12/06/2018 Document Reviewed: 12/06/2018 [employer] Patient Education  2022 [employer]  

## 2021-01-30 ENCOUNTER — Ambulatory Visit: Payer: Self-pay | Admitting: Family Medicine

## 2021-01-30 NOTE — Progress Notes (Incomplete)
Subjective:   By signing my name below, I, Shehryar Baig, attest that this documentation has been prepared under the direction and in the presence of Dr. Seabron Spates, DO. 01/30/2021    Patient ID: Jenna Arroyo, female    DOB: 11-Apr-1966, 55 y.o.   MRN: [id number]  No chief complaint on file.   HPI Patient is in today for a office visit.  She complains of   Past Medical History:  Diagnosis Date   Anxiety    High cholesterol    Hypertension    Lyme disease    Thyroid disease     Past Surgical History:  Procedure Laterality Date   ABDOMINAL EXPLORATION SURGERY     APPENDECTOMY     MASTOIDECTOMY     TONSILECTOMY/ADENOIDECTOMY WITH MYRINGOTOMY      Family History  Problem Relation Age of Onset   Hypertension Mother    High Cholesterol Mother    Heart disease Father    High Cholesterol Father    Hypertension Sister    High Cholesterol Sister    Hypertension Brother    High Cholesterol Brother    Colon cancer Neg Hx     Social History   Socioeconomic History   Marital status: Divorced    Spouse name: Not on file   Number of children: Not on file   Years of education: Not on file   Highest education level: Not on file  Occupational History   Not on file  Tobacco Use   Smoking status: Former    Types: Cigarettes    Quit date: 2006    Years since quitting: 16.5   Smokeless tobacco: Never   Tobacco comments:    socially   Building services engineer Use: Never used  Substance and Sexual Activity   Alcohol use: No   Drug use: No   Sexual activity: Not Currently    Birth control/protection: Post-menopausal  Other Topics Concern   Not on file  Social History Narrative   Not on file   Social Determinants of Health   Financial Resource Strain: Not on file  Food Insecurity: Not on file  Transportation Needs: No Transportation Needs   Lack of Transportation (Medical): No   Lack of Transportation (Non-Medical): No  Physical Activity: Not on file   Stress: Not on file  Social Connections: Not on file  Intimate Partner Violence: Not on file    Outpatient Medications Prior to Visit  Medication Sig Dispense Refill   ALPRAZolam (XANAX) 0.5 MG tablet TAKE ONE TABLET BY MOUTH FOUR TIMES A DAY (ONE TABLET EVERY MORNING, ONE TABLET AT NOON, ONE TABLET IN THE EVENING, AND ONE TABLET AT BEDTIME) 120 tablet 5   ARMOUR THYROID 15 MG tablet Take 3 tablets (45 mg total) by mouth daily. 90 tablet 3   atenolol (TENORMIN) 25 MG tablet Take 1 tablet by mouth twice daily 60 tablet 3   Cholecalciferol (VITAMIN D) 10 MCG/ML LIQD Take by mouth.     DULoxetine (CYMBALTA) 20 MG capsule Take 1 capsule (20 mg total) by mouth daily. 90 capsule 1   ELDERBERRY PO Take by mouth.     hydrochlorothiazide (MICROZIDE) 12.5 MG capsule Take 1 capsule by mouth once daily 30 capsule 0   hydrOXYzine (VISTARIL) 25 MG capsule TAKE ONE CAPSULE BY MOUTH TWICE TIME DAILY AS NEEDED 60 capsule 5   irbesartan (AVAPRO) 300 MG tablet Take 1 tablet by mouth once daily 30 tablet 0   montelukast (SINGULAIR) 10  MG tablet Take 1 tablet (10 mg total) by mouth at bedtime. 30 tablet 0   PREBIOTIC PRODUCT PO Take by mouth. Mix in shake and drink once a day.     silver sulfADIAZINE (SILVADENE) 1 % cream Apply 1 application topically daily. 50 g 0   Vitamin D, Ergocalciferol, (DRISDOL) 1.25 MG (50000 UNIT) CAPS capsule Take 1 capsule (50,000 Units total) by mouth every 7 (seven) days. 12 capsule 0   No facility-administered medications prior to visit.    Allergies  Allergen Reactions   Ceftriaxone Sodium In Dextrose Other (See Comments)    "heart races and cannot breathe"   Guaifenesin & Derivatives Other (See Comments)    "heart races, dizziness, light-headedness"   Meperidine Other (See Comments)    "heart races"   Other Rash   Penicillins Hives   Pseudoephedrine Other (See Comments)    "heart races and high blood pressure"   Rosuvastatin Calcium Nausea And Vomiting    Muscle  aches    Simvastatin Other (See Comments)    muscle aching    Ciprofloxacin    Rocephin [Ceftriaxone] Itching    ROS     Objective:    Physical Exam Constitutional:      General: She is not in acute distress.    Appearance: Normal appearance. She is not ill-appearing.  HENT:     Head: Normocephalic and atraumatic.     Right Ear: External ear normal.     Left Ear: External ear normal.  Eyes:     Extraocular Movements: Extraocular movements intact.     Pupils: Pupils are equal, round, and reactive to light.  Cardiovascular:     Rate and Rhythm: Normal rate and regular rhythm.     Heart sounds: Normal heart sounds. No murmur heard.   No gallop.  Pulmonary:     Effort: Pulmonary effort is normal. No respiratory distress.     Breath sounds: Normal breath sounds. No wheezing or rales.  Skin:    General: Skin is warm and dry.  Neurological:     Mental Status: She is alert and oriented to person, place, and time.  Psychiatric:        Behavior: Behavior normal.        Judgment: Judgment normal.    There were no vitals taken for this visit. Wt Readings from Last 3 Encounters:  01/27/21 208 lb (94.3 kg)  01/26/21 207 lb (93.9 kg)  12/20/20 213 lb 6.4 oz (96.8 kg)    Diabetic Foot Exam - Simple   No data filed    Lab Results  Component Value Date   WBC 6.8 12/06/2020   HGB 13.8 12/06/2020   HCT 40.3 12/06/2020   PLT 328.0 12/06/2020   GLUCOSE 80 12/06/2020   CHOL 292 (H) 10/15/2020   TRIG 181.0 (H) 10/15/2020   HDL 66.80 10/15/2020   LDLCALC 189 (H) 10/15/2020   ALT 19 12/06/2020   AST 16 12/06/2020   NA 140 12/06/2020   K 4.2 12/06/2020   CL 101 12/06/2020   CREATININE 1.01 12/06/2020   BUN 10 12/06/2020   CO2 29 12/06/2020   TSH 1.60 12/06/2020   MICROALBUR <0.7 12/11/2019    Lab Results  Component Value Date   TSH 1.60 12/06/2020   Lab Results  Component Value Date   WBC 6.8 12/06/2020   HGB 13.8 12/06/2020   HCT 40.3 12/06/2020   MCV 90.1  12/06/2020   PLT 328.0 12/06/2020   Lab Results  Component  Value Date   NA 140 12/06/2020   K 4.2 12/06/2020   CO2 29 12/06/2020   GLUCOSE 80 12/06/2020   BUN 10 12/06/2020   CREATININE 1.01 12/06/2020   BILITOT 0.5 12/06/2020   ALKPHOS 83 12/06/2020   AST 16 12/06/2020   ALT 19 12/06/2020   PROT 6.6 12/06/2020   ALBUMIN 4.2 12/06/2020   CALCIUM 9.6 12/06/2020   ANIONGAP 11 02/01/2020   GFR 62.75 12/06/2020   Lab Results  Component Value Date   CHOL 292 (H) 10/15/2020   Lab Results  Component Value Date   HDL 66.80 10/15/2020   Lab Results  Component Value Date   LDLCALC 189 (H) 10/15/2020   Lab Results  Component Value Date   TRIG 181.0 (H) 10/15/2020   Lab Results  Component Value Date   CHOLHDL 4 10/15/2020   No results found for: HGBA1C     Assessment & Plan:   Problem List Items Addressed This Visit   None    No orders of the defined types were placed in this encounter.   I, Dr. Seabron Spates, DO, personally preformed the services described in this documentation.  All medical record entries made by the scribe were at my direction and in my presence.  I have reviewed the chart and discharge instructions (if applicable) and agree that the record reflects my personal performance and is accurate and complete. 01/30/2021   I,Shehryar Baig,acting as a scribe for [employer], DO.,have documented all relevant documentation on the behalf of Donato Schultz, DO,as directed by  Donato Schultz, DO while in the presence of Donato Schultz, DO.   Shehryar [employer]

## 2021-02-11 ENCOUNTER — Encounter: Payer: Self-pay | Admitting: Psychiatry

## 2021-02-11 ENCOUNTER — Telehealth (INDEPENDENT_AMBULATORY_CARE_PROVIDER_SITE_OTHER): Payer: Self-pay | Admitting: Psychiatry

## 2021-02-11 DIAGNOSIS — F4001 Agoraphobia with panic disorder: Secondary | ICD-10-CM

## 2021-02-11 DIAGNOSIS — F411 Generalized anxiety disorder: Secondary | ICD-10-CM

## 2021-02-11 DIAGNOSIS — F900 Attention-deficit hyperactivity disorder, predominantly inattentive type: Secondary | ICD-10-CM

## 2021-02-11 MED ORDER — DULOXETINE HCL 20 MG PO CPEP: 20.0000 mg | ORAL_CAPSULE | Freq: Every day | ORAL | 1 refills | Status: DC

## 2021-02-11 NOTE — Progress Notes (Signed)
Jenna Arroyo [id number] 31-Mar-1966 55 y.o.   Virtual Visit via Telephone Note  I connected with pt by telephone and verified that I am speaking with the correct person using two identifiers.   I discussed the limitations, risks, security and privacy concerns of performing an evaluation and management service by telephone and the availability of in person appointments. I also discussed with the patient that there may be a patient responsible charge related to this service. The patient expressed understanding and agreed to proceed.  I discussed the assessment and treatment plan with the patient. The patient was provided an opportunity to ask questions and all were answered. The patient agreed with the plan and demonstrated an understanding of the instructions.   The patient was advised to call back or seek an in-person evaluation if the symptoms worsen or if the condition fails to improve as anticipated.  I provided 15 minutes of non-face-to-face time during this encounter. The call started at 915 and ended at 930. The patient was located at home and the provider was located office.   Subjective:   Patient ID:  Jenna Arroyo is a 55 y.o. (DOB [birth year]) female.  Chief Complaint:  Chief Complaint  Patient presents with   Follow-up   Anxiety   Stress    Anxiety Symptoms include nervous/anxious behavior. Patient reports no confusion, decreased concentration, palpitations or suicidal ideas.    Jenna Arroyo presents to the office today for follow-up of anxiety and mood.  seen October 2020.  She had been having cognitive complaints as previously noted. Still recommend trial switch to Ativan 1 mg QID to see if cognition is better.  Her cognitive problems have frequently been in a contributor to her difficulty maintaining employment.  She is fearful about having Xanax withdrawal.  This was discussed in detail.  Also discussed in detail the potential sedative effects of benzodiazepines and  especially in the first week of the transition from 1 of the medications to another.  It is likely that she will be more successful with the transition if she is a little more sleepy than normal the first week by using half of each medication then if we abruptly switch medications.  That has a much lower success rate in her case because of her worry about the transition. She agrees to a trial.  Will do 1/2 of 0.5 mg tablet tablet of Xanax and 1/2 of 1 mg tablet Ativan QID for 1 week. Then DC alprazolam and take Ativan 1 mg 3 times daily or 4 times daily to control anxiety.  Call back in 2 to 3 weeks to give a report of how well she is doing with the new medication. DC hydroxyzine because of its potential to cause cognitive problems. Continue duloxetine 60 mg daily.  10/18/19 appt with the following noted and no further med changes: She had difficulty implementing the transition and then complained of insomnia with lorazepam and was switched back to alprazolam after a week.  Also CO pain and nausea.   Overall doing OK except son and D upset with her since December.  Disc this in detail.  Hasn't seen GD's since then.  Working at job she loves but very PT. Exercising and lost 18#  02/21/20 with the following noted: Hanging in there with stress.  Not allowed to see GD still.  Doesn't know why. No Covid.  OCC BP spikes. No major med changes. Satisfied with duloxetine and Xanax.  No full panic.   Sleep  waxes and wanes but lately good.  Working same job as in April and likes it. Plan: no med changes  08/21/2020 appointment with the following noted: Stopped duloxetine for a week early Feb over BP concerns from it.  Convinced BP affected by 20 mg duloxetine which she's been taking for a few months.  Felt 30 mg was too high and then it started causing problems.  When off the duloxetine for a week then BP went down. Times when doesn't have it and the most noticeable thing is feeling tired and a little  disconnected.  Longest off it was 2-4 weeks in last 6 mos. Has unresponsive PCP. Chronic tiredness.  Still dealing with chronic Lyme dz.  Plan: Stop duloxetine BC of BP concerns.  Monitor and record BP and share with PCP.  Stay off for a full month and if consistently more anxious then can add pure SSRI low dose Lexapro 5 mg for anxiety.  11/18/20 TC Pt had asked to increase Lexapro to 10 mg while mo in the hospital.  11/25/20 TC:  Pt contacted doctor on call this weekend concerning her issues with Lexapro 10 mg. Since the increase to 10 mg she has been unable to sleep, reports black circles under her eyes, gets to sleep about 1-2 am. She also reports on Saturday she had a anxiety attack, she felt it was medication related not just her anxiety. Jenna Rack, NP advised her to stop Lexapro and nurse would contact her Monday.  She asks about trying just the Lexapro 5 mg, she reports it helped and it didn't effect her sleep like 10 mg.  Pt stated the lexapro made her have increased anxiety and she thinks its best if she goes back to a very low dose of Cymbalta.She stopped Cymbalta due to it spiking her blood pressure.She says her bp was fine at the 20 mg dose. MD: ok to stop Lexapro and return to duloxetine 20  12/11/20 appt noted: Under more stress and more anxious.  Stress about 3 mos.  Dealing with condo where she lives.  Not making enough money to get basic things she needs and asking for money from parents.  Wants father to fix things in the condo but he doesn't do anything.   Place had a leak in the past.    Taking duloxetine 20 and off Lexapro and on alprazolam 0.5 mg QID.   Worries over the condition in the world and thinks the world could end and she might not be able to get the Xanax and fears withdrawal.    She feels bible is predicting end of the world soon.  But no thoughts of death were noted Plan: no med changes per her request.  02/11/2021 appointment with the following noted: Family  sold condo she was in and so she moved back in with parents about 3 weeks ago.  Stressed.  Has been seeking disability for a couple of years.  Doesn't think she can work FT.  Other relationships are stressed.   Chronic difficulty with organization.  Walking and losing weight but worries about BP so doesn't want to increase the duloxetine.  Patient reports stable mood and denies depressed or irritable moods.  Patient has chronic difficulty with anxiety and stress. Denies appetite disturbance.  Patient reports that energy and motivation have been good.  Patient denies any difficulty with concentration.  Patient denies any suicidal ideation. No panic. Sleep is OK.  Using Xanax mostly TID.  On duloxetine 20 mg daily  and doesn't want to change it..  Sleep better in the day.    No questions concerns re: meds.  Past Psychiatric Medication Trials: Duloxetine 60, imipramine, desipramine, sertraline hyper, citalopram side effects, paroxetine chest pain,  Lexapro ? Less effective for anxiety Nuvigil edgy,  buspirone dizzy,  Xanax, Ambien anxiety, trazodone side effects, atenolol  Review of Systems:  Review of Systems  Constitutional:  Positive for fatigue.       Low exercise tolerance  Cardiovascular:  Negative for palpitations.  Gastrointestinal:  Negative for vomiting.  Musculoskeletal:  Positive for arthralgias and neck pain.  Neurological:  Positive for headaches. Negative for tremors and weakness.       Foggy headed.  Psychiatric/Behavioral:  Positive for sleep disturbance. Negative for agitation, behavioral problems, confusion, decreased concentration, dysphoric mood, hallucinations, self-injury and suicidal ideas. The patient is nervous/anxious. The patient is not hyperactive.    Medications: I have reviewed the patient's current medications.  Current Outpatient Medications  Medication Sig Dispense Refill   ALPRAZolam (XANAX) 0.5 MG tablet TAKE ONE TABLET BY MOUTH FOUR TIMES A DAY (ONE  TABLET EVERY MORNING, ONE TABLET AT NOON, ONE TABLET IN THE EVENING, AND ONE TABLET AT BEDTIME) 120 tablet 5   ARMOUR THYROID 15 MG tablet Take 3 tablets (45 mg total) by mouth daily. 90 tablet 3   atenolol (TENORMIN) 25 MG tablet Take 1 tablet by mouth twice daily 60 tablet 3   Cholecalciferol (VITAMIN D) 10 MCG/ML LIQD Take by mouth.     hydrochlorothiazide (MICROZIDE) 12.5 MG capsule Take 1 capsule by mouth once daily 30 capsule 0   hydrOXYzine (VISTARIL) 25 MG capsule TAKE ONE CAPSULE BY MOUTH TWICE TIME DAILY AS NEEDED 60 capsule 5   irbesartan (AVAPRO) 300 MG tablet Take 1 tablet by mouth once daily 30 tablet 0   montelukast (SINGULAIR) 10 MG tablet Take 1 tablet (10 mg total) by mouth at bedtime. 30 tablet 0   silver sulfADIAZINE (SILVADENE) 1 % cream Apply 1 application topically daily. 50 g 0   Vitamin D, Ergocalciferol, (DRISDOL) 1.25 MG (50000 UNIT) CAPS capsule Take 1 capsule (50,000 Units total) by mouth every 7 (seven) days. 12 capsule 0   DULoxetine (CYMBALTA) 20 MG capsule Take 1 capsule (20 mg total) by mouth daily. 90 capsule 1   ELDERBERRY PO Take by mouth. (Patient not taking: Reported on 02/11/2021)     PREBIOTIC PRODUCT PO Take by mouth. Mix in shake and drink once a day. (Patient not taking: Reported on 02/11/2021)     No current facility-administered medications for this visit.    Medication Side Effects: None  Allergies:  Allergies  Allergen Reactions   Ceftriaxone Sodium In Dextrose Other (See Comments)    "heart races and cannot breathe"   Guaifenesin & Derivatives Other (See Comments)    "heart races, dizziness, light-headedness"   Meperidine Other (See Comments)    "heart races"   Other Rash   Penicillins Hives   Pseudoephedrine Other (See Comments)    "heart races and high blood pressure"   Rosuvastatin Calcium Nausea And Vomiting    Muscle aches    Simvastatin Other (See Comments)    muscle aching    Ciprofloxacin    Rocephin [Ceftriaxone] Itching     Past Medical History:  Diagnosis Date   Anxiety    High cholesterol    Hypertension    Lyme disease    Thyroid disease     Family History  Problem Relation Age of Onset  Hypertension Mother    High Cholesterol Mother    Heart disease Father    High Cholesterol Father    Hypertension Sister    High Cholesterol Sister    Hypertension Brother    High Cholesterol Brother    Colon cancer Neg Hx     Social History   Socioeconomic History   Marital status: Divorced    Spouse name: Not on file   Number of children: Not on file   Years of education: Not on file   Highest education level: Not on file  Occupational History   Not on file  Tobacco Use   Smoking status: Former    Types: Cigarettes    Quit date: 2006    Years since quitting: 16.6   Smokeless tobacco: Never   Tobacco comments:    socially   Building services engineerVaping Use   Vaping Use: Never used  Substance and Sexual Activity   Alcohol use: No   Drug use: No   Sexual activity: Not Currently    Birth control/protection: Post-menopausal  Other Topics Concern   Not on file  Social History Narrative   Not on file   Social Determinants of Health   Financial Resource Strain: Not on file  Food Insecurity: Not on file  Transportation Needs: No Transportation Needs   Lack of Transportation (Medical): No   Lack of Transportation (Non-Medical): No  Physical Activity: Not on file  Stress: Not on file  Social Connections: Not on file  Intimate Partner Violence: Not on file    Past Medical History, Surgical history, Social history, and Family history were reviewed and updated as appropriate.   Please see review of systems for further details on the patient's review from today.   Objective:   Physical Exam:  There were no vitals taken for this visit.  Physical Exam Constitutional:      General: She is not in acute distress. Musculoskeletal:        General: No deformity.  Neurological:     Mental Status: She is  alert and oriented to person, place, and time.     Cranial Nerves: No dysarthria.     Coordination: Coordination normal.  Psychiatric:        Attention and Perception: Perception normal. She is inattentive. She does not perceive auditory or visual hallucinations.        Mood and Affect: Mood is anxious. Mood is not depressed. Affect is not labile, blunt, angry, tearful or inappropriate.        Speech: Speech normal.        Behavior: Behavior normal. Behavior is cooperative.        Thought Content: Thought content normal. Thought content is not paranoid or delusional. Thought content does not include homicidal or suicidal ideation. Thought content does not include homicidal or suicidal plan.        Cognition and Memory: Cognition and memory normal.        Judgment: Judgment normal.     Comments: Insight fair.  Patient has chronically low stress tolerance. Complain of chronic forgetfulness     Lab Review:     Component Value Date/Time   NA 140 12/06/2020 1018   K 4.2 12/06/2020 1018   CL 101 12/06/2020 1018   CO2 29 12/06/2020 1018   GLUCOSE 80 12/06/2020 1018   BUN 10 12/06/2020 1018   CREATININE 1.01 12/06/2020 1018   CREATININE 0.85 04/05/2020 1052   CALCIUM 9.6 12/06/2020 1018   PROT 6.6 12/06/2020 1018  ALBUMIN 4.2 12/06/2020 1018   AST 16 12/06/2020 1018   ALT 19 12/06/2020 1018   ALKPHOS 83 12/06/2020 1018   BILITOT 0.5 12/06/2020 1018   GFRNONAA >60 02/01/2020 1513   GFRAA >60 02/01/2020 1513       Component Value Date/Time   WBC 6.8 12/06/2020 1018   RBC 4.48 12/06/2020 1018   HGB 13.8 12/06/2020 1018   HCT 40.3 12/06/2020 1018   PLT 328.0 12/06/2020 1018   MCV 90.1 12/06/2020 1018   MCH 30.3 04/05/2020 1052   MCHC 34.3 12/06/2020 1018   RDW 13.4 12/06/2020 1018   LYMPHSABS 1.6 12/06/2020 1018   MONOABS 0.3 12/06/2020 1018   EOSABS 0.2 12/06/2020 1018   BASOSABS 0.1 12/06/2020 1018    No results found for: POCLITH, LITHIUM   No results found for:  PHENYTOIN, PHENOBARB, VALPROATE, CBMZ   .res Assessment: Plan:   Generalized anxiety disorder, manageable  Attention deficit hyperactivity disorder improved with duloxetine  History of panic attacks in remission with current meds  Patient has been under our care for many years.  She has some chronic instability and anxiety and function but does generally get benefit from the medication.  She has not been able to reduce the dosage of alprazolam which she has taken for many years due to excessive anxiety.  Chronic problems and conflicts with parents and sister and brother and other people  Wants to stay on the Cymbalta 20 instead of the Lexapro.  Thought Lexapro started causing anxiety problems but has been more stressed lately.  Afraid of increasing duloxetine because of fear will affect her blood pressure.  She failed attempted transition from alprazolam to lorazepam in October 2020.  It appears she had some withdrawal symptoms and she is gone back to alprazolam.  It was discussed with her that we could do the transition more gradually if she wanted to pursue that to see if she can get cognitive benefit with the switch.  She does not want to do that at this time. Continue Xanax 0.5 mg QID Disc her fears of withdrawal and med shortage.  Reassured her about this issue.  No med changes today.  We discussed the short-term risks associated with benzodiazepines including sedation and increased fall risk among others.  Discussed long-term side effect risk including dependence, potential withdrawal symptoms, and the potential eventual dose-related risk of dementia.  But recent studies from 2020 dispute this association between benzodiazepines and dementia risk. Newer studies in 2020 do not support an association with dementia.   Has started counseling at [facility]  FU 3-4 mos  Meredith Staggers, MD, DFAPA   Please see After Visit Summary for patient specific instructions.  No future  appointments.   No orders of the defined types were placed in this encounter.     -------------------------------

## 2021-02-17 ENCOUNTER — Telehealth: Payer: Self-pay

## 2021-02-17 NOTE — Telephone Encounter (Signed)
Pt has an appointment on 8/16 for her BP.   She says her BP has been around 145/106. She took Avapro 300 mg about 1 hr ago and it is now 138/104.   Please advise.   CB number: [phone].

## 2021-02-18 ENCOUNTER — Ambulatory Visit (INDEPENDENT_AMBULATORY_CARE_PROVIDER_SITE_OTHER): Payer: Self-pay | Admitting: Family Medicine

## 2021-02-18 ENCOUNTER — Encounter: Payer: Self-pay | Admitting: Family Medicine

## 2021-02-18 ENCOUNTER — Other Ambulatory Visit: Payer: Self-pay

## 2021-02-18 VITALS — BP 102/78 | HR 88 | Temp 99.3°F | Resp 18 | Ht 66.0 in | Wt 208.6 lb

## 2021-02-18 DIAGNOSIS — R1013 Epigastric pain: Secondary | ICD-10-CM | POA: Insufficient documentation

## 2021-02-18 DIAGNOSIS — S161XXA Strain of muscle, fascia and tendon at neck level, initial encounter: Secondary | ICD-10-CM | POA: Insufficient documentation

## 2021-02-18 MED ORDER — TIZANIDINE HCL 2 MG PO CAPS: 2.0000 mg | ORAL_CAPSULE | Freq: Three times a day (TID) | ORAL | 1 refills | Status: DC

## 2021-02-18 MED ORDER — FAMOTIDINE 20 MG PO TABS: 20.0000 mg | ORAL_TABLET | Freq: Two times a day (BID) | ORAL | 2 refills | Status: DC

## 2021-02-18 NOTE — Telephone Encounter (Signed)
Pt has appt today at 2:00pm

## 2021-02-18 NOTE — Progress Notes (Signed)
Subjective:   By signing my name below, I, Shehryar Baig, attest that this documentation has been prepared under the direction and in the presence of Dr. Seabron SpatesLowne-Chase, Meryl Hubers, DO. 02/18/2021     Patient ID: Jenna Arroyo, female    DOB: 08/01/1965, 55 y.o.   MRN: [id number]  Chief Complaint  Patient presents with   Hypertension   Follow-up   Fall    7 days, pt states falling down some steps on to her back.    HPI Patient is in today for a office visit.  She reports her blood pressure is fluctuating since yesterday. It was 148/86 yesterday morning and continued going up and down. She continues taking 300 mg avapro daily PO, 12.5 mg hydrochlorothiazide daily PO, 25 mg atenolol daily PO and reports no new issues while taking them. She notes that she only takes 12.5 mg hydrochlorothiazide daily PO when her swelling worsens.   BP Readings from Last 3 Encounters:  02/18/21 102/78  01/27/21 110/72  01/26/21 116/86   She also complains of having a headache since 08/09 after falling down the stairs. When she fell she landed on her back and hit her head but reports that her head did not hurt at that time. Her neck was hurting more during that time. She has pain in her middle back up to her neck at this time.  She reports of stomach pain and increase body temperature after eating a fig. She has taken a Covid-19 test and was negative.    Past Medical History:  Diagnosis Date   Anxiety    High cholesterol    Hypertension    Lyme disease    Thyroid disease     Past Surgical History:  Procedure Laterality Date   ABDOMINAL EXPLORATION SURGERY     APPENDECTOMY     MASTOIDECTOMY     TONSILECTOMY/ADENOIDECTOMY WITH MYRINGOTOMY      Family History  Problem Relation Age of Onset   Hypertension Mother    High Cholesterol Mother    Heart disease Father    High Cholesterol Father    Hypertension Sister    High Cholesterol Sister    Hypertension Brother    High Cholesterol Brother     Colon cancer Neg Hx     Social History   Socioeconomic History   Marital status: Divorced    Spouse name: Not on file   Number of children: Not on file   Years of education: Not on file   Highest education level: Not on file  Occupational History   Not on file  Tobacco Use   Smoking status: Former    Types: Cigarettes    Quit date: 2006    Years since quitting: 16.6   Smokeless tobacco: Never   Tobacco comments:    socially   Building services engineerVaping Use   Vaping Use: Never used  Substance and Sexual Activity   Alcohol use: No   Drug use: No   Sexual activity: Not Currently    Birth control/protection: Post-menopausal  Other Topics Concern   Not on file  Social History Narrative   Not on file   Social Determinants of Health   Financial Resource Strain: Not on file  Food Insecurity: Not on file  Transportation Needs: No Transportation Needs   Lack of Transportation (Medical): No   Lack of Transportation (Non-Medical): No  Physical Activity: Not on file  Stress: Not on file  Social Connections: Not on file  Intimate Partner Violence: Not on  file    Outpatient Medications Prior to Visit  Medication Sig Dispense Refill   ALPRAZolam (XANAX) 0.5 MG tablet TAKE ONE TABLET BY MOUTH FOUR TIMES A DAY (ONE TABLET EVERY MORNING, ONE TABLET AT NOON, ONE TABLET IN THE EVENING, AND ONE TABLET AT BEDTIME) 120 tablet 5   ARMOUR THYROID 15 MG tablet Take 3 tablets (45 mg total) by mouth daily. 90 tablet 3   atenolol (TENORMIN) 25 MG tablet Take 1 tablet by mouth twice daily 60 tablet 3   Cholecalciferol (VITAMIN D) 10 MCG/ML LIQD Take by mouth.     DULoxetine (CYMBALTA) 20 MG capsule Take 1 capsule (20 mg total) by mouth daily. 90 capsule 1   ELDERBERRY PO Take by mouth.     hydrochlorothiazide (MICROZIDE) 12.5 MG capsule Take 1 capsule by mouth once daily 30 capsule 0   hydrOXYzine (VISTARIL) 25 MG capsule TAKE ONE CAPSULE BY MOUTH TWICE TIME DAILY AS NEEDED 60 capsule 5   irbesartan (AVAPRO)  300 MG tablet Take 1 tablet by mouth once daily 30 tablet 0   montelukast (SINGULAIR) 10 MG tablet Take 1 tablet (10 mg total) by mouth at bedtime. 30 tablet 0   PREBIOTIC PRODUCT PO Take by mouth. Mix in shake and drink once a day.     silver sulfADIAZINE (SILVADENE) 1 % cream Apply 1 application topically daily. 50 g 0   Vitamin D, Ergocalciferol, (DRISDOL) 1.25 MG (50000 UNIT) CAPS capsule Take 1 capsule (50,000 Units total) by mouth every 7 (seven) days. 12 capsule 0   No facility-administered medications prior to visit.    Allergies  Allergen Reactions   Ceftriaxone Sodium In Dextrose Other (See Comments)    "heart races and cannot breathe"   Guaifenesin & Derivatives Other (See Comments)    "heart races, dizziness, light-headedness"   Meperidine Other (See Comments)    "heart races"   Other Rash   Penicillins Hives   Pseudoephedrine Other (See Comments)    "heart races and high blood pressure"   Rosuvastatin Calcium Nausea And Vomiting    Muscle aches    Simvastatin Other (See Comments)    muscle aching    Ciprofloxacin    Rocephin [Ceftriaxone] Itching    Review of Systems  Constitutional:  Negative for chills, fever and malaise/fatigue.  HENT:  Negative for congestion and hearing loss.   Eyes:  Negative for blurred vision and discharge.  Respiratory:  Negative for cough, sputum production and shortness of breath.   Cardiovascular:  Negative for chest pain, palpitations and leg swelling.  Gastrointestinal:  Negative for abdominal pain, blood in stool, constipation, diarrhea, heartburn, nausea and vomiting.  Genitourinary:  Negative for dysuria, frequency, hematuria and urgency.  Musculoskeletal:  Positive for back pain and neck pain. Negative for falls and myalgias.  Skin:  Negative for rash.  Neurological:  Positive for headaches. Negative for dizziness, sensory change, loss of consciousness and weakness.  Endo/Heme/Allergies:  Negative for environmental allergies.  Does not bruise/bleed easily.  Psychiatric/Behavioral:  Negative for depression and suicidal ideas. The patient is not nervous/anxious and does not have insomnia.       Objective:    Physical Exam Vitals and nursing note reviewed.  Constitutional:      General: She is not in acute distress.    Appearance: Normal appearance. She is not ill-appearing.  HENT:     Head: Normocephalic and atraumatic.     Right Ear: External ear normal.     Left Ear: External ear normal.  Eyes:     Extraocular Movements: Extraocular movements intact.     Pupils: Pupils are equal, round, and reactive to light.  Cardiovascular:     Rate and Rhythm: Normal rate and regular rhythm.     Heart sounds: Normal heart sounds. No murmur heard.   No gallop.  Pulmonary:     Effort: Pulmonary effort is normal. No respiratory distress.     Breath sounds: Normal breath sounds. No wheezing or rales.  Abdominal:     Tenderness: There is abdominal tenderness in the epigastric area.  Skin:    General: Skin is warm and dry.  Neurological:     Mental Status: She is alert and oriented to person, place, and time.  Psychiatric:        Behavior: Behavior normal.        Judgment: Judgment normal.    BP 102/78 (BP Location: Left Arm, Patient Position: Sitting, Cuff Size: Large)   Pulse 88   Temp 99.3 F (37.4 C) (Oral)   Resp 18   Ht 5\' 6"  (1.676 m)   Wt 208 lb 9.6 oz (94.6 kg)   SpO2 98%   BMI 33.67 kg/m  Wt Readings from Last 3 Encounters:  02/18/21 208 lb 9.6 oz (94.6 kg)  01/27/21 208 lb (94.3 kg)  01/26/21 207 lb (93.9 kg)    Diabetic Foot Exam - Simple   No data filed    Lab Results  Component Value Date   WBC 6.8 12/06/2020   HGB 13.8 12/06/2020   HCT 40.3 12/06/2020   PLT 328.0 12/06/2020   GLUCOSE 80 12/06/2020   CHOL 292 (H) 10/15/2020   TRIG 181.0 (H) 10/15/2020   HDL 66.80 10/15/2020   LDLCALC 189 (H) 10/15/2020   ALT 19 12/06/2020   AST 16 12/06/2020   NA 140 12/06/2020   K 4.2  12/06/2020   CL 101 12/06/2020   CREATININE 1.01 12/06/2020   BUN 10 12/06/2020   CO2 29 12/06/2020   TSH 1.60 12/06/2020   MICROALBUR <0.7 12/11/2019    Lab Results  Component Value Date   TSH 1.60 12/06/2020   Lab Results  Component Value Date   WBC 6.8 12/06/2020   HGB 13.8 12/06/2020   HCT 40.3 12/06/2020   MCV 90.1 12/06/2020   PLT 328.0 12/06/2020   Lab Results  Component Value Date   NA 140 12/06/2020   K 4.2 12/06/2020   CO2 29 12/06/2020   GLUCOSE 80 12/06/2020   BUN 10 12/06/2020   CREATININE 1.01 12/06/2020   BILITOT 0.5 12/06/2020   ALKPHOS 83 12/06/2020   AST 16 12/06/2020   ALT 19 12/06/2020   PROT 6.6 12/06/2020   ALBUMIN 4.2 12/06/2020   CALCIUM 9.6 12/06/2020   ANIONGAP 11 02/01/2020   GFR 62.75 12/06/2020   Lab Results  Component Value Date   CHOL 292 (H) 10/15/2020   Lab Results  Component Value Date   HDL 66.80 10/15/2020   Lab Results  Component Value Date   LDLCALC 189 (H) 10/15/2020   Lab Results  Component Value Date   TRIG 181.0 (H) 10/15/2020   Lab Results  Component Value Date   CHOLHDL 4 10/15/2020   No results found for: HGBA1C     Assessment & Plan:   Problem List Items Addressed This Visit       Unprioritized   Cervical strain - Primary    Muscle relaxer send in  Can take tylenol  Heat  rto prn  Relevant Medications   tizanidine (ZANAFLEX) 2 MG capsule   Dyspepsia    Start protonix daily gerd diet HO      Relevant Medications   famotidine (PEPCID) 20 MG tablet     Meds ordered this encounter  Medications   tizanidine (ZANAFLEX) 2 MG capsule    Sig: Take 1 capsule (2 mg total) by mouth 3 (three) times daily.    Dispense:  30 capsule    Refill:  1   famotidine (PEPCID) 20 MG tablet    Sig: Take 1 tablet (20 mg total) by mouth 2 (two) times daily.    Dispense:  60 tablet    Refill:  2    I, Dr. Seabron Spates, DO, personally preformed the services described in this documentation.   All medical record entries made by the scribe were at my direction and in my presence.  I have reviewed the chart and discharge instructions (if applicable) and agree that the record reflects my personal performance and is accurate and complete. 02/18/2021   I,Shehryar Baig,acting as a scribe for Donato Schultz, DO.,have documented all relevant documentation on the behalf of Donato Schultz, DO,as directed by  Donato Schultz, DO while in the presence of Donato Schultz, DO.   Donato Schultz, DO

## 2021-02-18 NOTE — Assessment & Plan Note (Signed)
Muscle relaxer send in  Can take tylenol  Heat  rto prn

## 2021-02-18 NOTE — Patient Instructions (Signed)
https://www.nhlbi.nih.gov/files/docs/public/heart/dash_brief.pdf">  DASH Eating Plan DASH stands for Dietary Approaches to Stop Hypertension. The DASH eating plan is a healthy eating plan that has been shown to: Reduce high blood pressure (hypertension). Reduce your risk for type 2 diabetes, heart disease, and stroke. Help with weight loss. What are tips for following this plan? Reading food labels Check food labels for the amount of salt (sodium) per serving. Choose foods with less than 5 percent of the Daily Value of sodium. Generally, foods with less than 300 milligrams (mg) of sodium per serving fit into this eating plan. To find whole grains, look for the word "whole" as the first word in the ingredient list. Shopping Buy products labeled as "low-sodium" or "no salt added." Buy fresh foods. Avoid canned foods and pre-made or frozen meals. Cooking Avoid adding salt when cooking. Use salt-free seasonings or herbs instead of table salt or sea salt. Check with your health care provider or pharmacist before using salt substitutes. Do not fry foods. Cook foods using healthy methods such as baking, boiling, grilling, roasting, and broiling instead. Cook with heart-healthy oils, such as olive, canola, avocado, soybean, or sunflower oil. Meal planning  Eat a balanced diet that includes: 4 or more servings of fruits and 4 or more servings of vegetables each day. Try to fill one-half of your plate with fruits and vegetables. 6-8 servings of whole grains each day. Less than 6 oz (170 g) of lean meat, poultry, or fish each day. A 3-oz (85-g) serving of meat is about the same size as a deck of cards. One egg equals 1 oz (28 g). 2-3 servings of low-fat dairy each day. One serving is 1 cup (237 mL). 1 serving of nuts, seeds, or beans 5 times each week. 2-3 servings of heart-healthy fats. Healthy fats called omega-3 fatty acids are found in foods such as walnuts, flaxseeds, fortified milks, and eggs.  These fats are also found in cold-water fish, such as sardines, salmon, and mackerel. Limit how much you eat of: Canned or prepackaged foods. Food that is high in trans fat, such as some fried foods. Food that is high in saturated fat, such as fatty meat. Desserts and other sweets, sugary drinks, and other foods with added sugar. Full-fat dairy products. Do not salt foods before eating. Do not eat more than 4 egg yolks a week. Try to eat at least 2 vegetarian meals a week. Eat more home-cooked food and less restaurant, buffet, and fast food.  Lifestyle When eating at a restaurant, ask that your food be prepared with less salt or no salt, if possible. If you drink alcohol: Limit how much you use to: 0-1 drink a day for women who are not pregnant. 0-2 drinks a day for men. Be aware of how much alcohol is in your drink. In the [place] one drink equals one 12 oz bottle of beer (355 mL), one 5 oz glass of wine (148 mL), or one 1 oz glass of hard liquor (44 mL). General information Avoid eating more than 2,300 mg of salt a day. If you have hypertension, you may need to reduce your sodium intake to 1,500 mg a day. Work with your health care provider to maintain a healthy body weight or to lose weight. Ask what an ideal weight is for you. Get at least 30 minutes of exercise that causes your heart to beat faster (aerobic exercise) most days of the week. Activities may include walking, swimming, or biking. Work with your health care provider   or dietitian to adjust your eating plan to your individual calorie needs. What foods should I eat? Fruits All fresh, dried, or frozen fruit. Canned fruit in natural juice (without addedsugar). Vegetables Fresh or frozen vegetables (raw, steamed, roasted, or grilled). Low-sodium or reduced-sodium tomato and vegetable juice. Low-sodium or reduced-sodium tomatosauce and tomato paste. Low-sodium or reduced-sodium canned vegetables. Grains Whole-grain or  whole-wheat bread. Whole-grain or whole-wheat pasta. Brown rice. Oatmeal. Quinoa. Bulgur. Whole-grain and low-sodium cereals. Pita bread.Low-fat, low-sodium crackers. Whole-wheat flour tortillas. Meats and other proteins Skinless chicken or turkey. Ground chicken or turkey. Pork with fat trimmed off. Fish and seafood. Egg whites. Dried beans, peas, or lentils. Unsalted nuts, nut butters, and seeds. Unsalted canned beans. Lean cuts of beef with fat trimmed off. Low-sodium, lean precooked or cured meat, such as sausages or meatloaves. Dairy Low-fat (1%) or fat-free (skim) milk. Reduced-fat, low-fat, or fat-free cheeses. Nonfat, low-sodium ricotta or cottage cheese. Low-fat or nonfatyogurt. Low-fat, low-sodium cheese. Fats and oils Soft margarine without trans fats. Vegetable oil. Reduced-fat, low-fat, or light mayonnaise and salad dressings (reduced-sodium). Canola, safflower, olive, avocado, soybean, andsunflower oils. Avocado. Seasonings and condiments Herbs. Spices. Seasoning mixes without salt. Other foods Unsalted popcorn and pretzels. Fat-free sweets. The items listed above may not be a complete list of foods and beverages you can eat. Contact a dietitian for more information. What foods should I avoid? Fruits Canned fruit in a light or heavy syrup. Fried fruit. Fruit in cream or buttersauce. Vegetables Creamed or fried vegetables. Vegetables in a cheese sauce. Regular canned vegetables (not low-sodium or reduced-sodium). Regular canned tomato sauce and paste (not low-sodium or reduced-sodium). Regular tomato and vegetable juice(not low-sodium or reduced-sodium). Pickles. Olives. Grains Baked goods made with fat, such as croissants, muffins, or some breads. Drypasta or rice meal packs. Meats and other proteins Fatty cuts of meat. Ribs. Fried meat. Bacon. Bologna, salami, and other precooked or cured meats, such as sausages or meat loaves. Fat from the back of a pig (fatback). Bratwurst.  Salted nuts and seeds. Canned beans with added salt. Canned orsmoked fish. Whole eggs or egg yolks. Chicken or turkey with skin. Dairy Whole or 2% milk, cream, and half-and-half. Whole or full-fat cream cheese. Whole-fat or sweetened yogurt. Full-fat cheese. Nondairy creamers. Whippedtoppings. Processed cheese and cheese spreads. Fats and oils Butter. Stick margarine. Lard. Shortening. Ghee. Bacon fat. Tropical oils, suchas coconut, palm kernel, or palm oil. Seasonings and condiments Onion salt, garlic salt, seasoned salt, table salt, and sea salt. Worcestershire sauce. Tartar sauce. Barbecue sauce. Teriyaki sauce. Soy sauce, including reduced-sodium. Steak sauce. Canned and packaged gravies. Fish sauce. Oyster sauce. Cocktail sauce. Store-bought horseradish. Ketchup. Mustard. Meat flavorings and tenderizers. Bouillon cubes. Hot sauces. Pre-made or packaged marinades. Pre-made or packaged taco seasonings. Relishes. Regular saladdressings. Other foods Salted popcorn and pretzels. The items listed above may not be a complete list of foods and beverages you should avoid. Contact a dietitian for more information. Where to find more information National Heart, Lung, and Blood Institute: www.nhlbi.nih.gov American Heart Association: www.heart.org Academy of Nutrition and Dietetics: www.eatright.org National Kidney Foundation: www.kidney.org Summary The DASH eating plan is a healthy eating plan that has been shown to reduce high blood pressure (hypertension). It may also reduce your risk for type 2 diabetes, heart disease, and stroke. When on the DASH eating plan, aim to eat more fresh fruits and vegetables, whole grains, lean proteins, low-fat dairy, and heart-healthy fats. With the DASH eating plan, you should limit salt (sodium) intake to 2,300   mg a day. If you have hypertension, you may need to reduce your sodium intake to 1,500 mg a day. Work with your health care provider or dietitian to adjust  your eating plan to your individual calorie needs. This information is not intended to replace advice given to you by your health care provider. Make sure you discuss any questions you have with your healthcare provider. Document Revised: 05/26/2019 Document Reviewed: 05/26/2019 Elsevier Patient Education  2022 Elsevier Inc.  

## 2021-02-18 NOTE — Assessment & Plan Note (Signed)
Start protonix daily gerd diet HO

## 2021-02-21 ENCOUNTER — Telehealth: Payer: Self-pay | Admitting: Family Medicine

## 2021-02-21 NOTE — Telephone Encounter (Signed)
PT states she has been very tired. And wants to know if some labs can be ordered. To check her iron potassium, or anything that would explain her lack of energy.

## 2021-02-22 ENCOUNTER — Other Ambulatory Visit: Payer: Self-pay | Admitting: Family Medicine

## 2021-02-22 DIAGNOSIS — I10 Essential (primary) hypertension: Secondary | ICD-10-CM

## 2021-02-24 ENCOUNTER — Other Ambulatory Visit: Payer: Self-pay | Admitting: Family Medicine

## 2021-02-24 DIAGNOSIS — R5383 Other fatigue: Secondary | ICD-10-CM

## 2021-02-24 NOTE — Telephone Encounter (Signed)
Labs ordered. Pt just needs a lab appt.

## 2021-02-26 ENCOUNTER — Other Ambulatory Visit: Payer: Self-pay

## 2021-03-04 ENCOUNTER — Ambulatory Visit (INDEPENDENT_AMBULATORY_CARE_PROVIDER_SITE_OTHER): Payer: Self-pay | Admitting: Family Medicine

## 2021-03-04 ENCOUNTER — Other Ambulatory Visit: Payer: Self-pay

## 2021-03-04 ENCOUNTER — Encounter: Payer: Self-pay | Admitting: Family Medicine

## 2021-03-04 VITALS — BP 108/78 | HR 97 | Temp 98.6°F | Resp 18 | Ht 66.0 in | Wt 208.0 lb

## 2021-03-04 DIAGNOSIS — R5383 Other fatigue: Secondary | ICD-10-CM

## 2021-03-04 DIAGNOSIS — I10 Essential (primary) hypertension: Secondary | ICD-10-CM

## 2021-03-04 MED ORDER — LOSARTAN POTASSIUM 100 MG PO TABS
100.0000 mg | ORAL_TABLET | Freq: Every day | ORAL | 1 refills | Status: DC
Start: 2021-03-04 — End: 2021-05-05

## 2021-03-04 NOTE — Progress Notes (Addendum)
Subjective:   By signing my name below, I, Shehryar Baig, attest that this documentation has been prepared under the direction and in the presence of Dr. Seabron Spates, DO. 03/04/2021   Patient ID: Jenna Arroyo, female    DOB: April 08, 1966, 55 y.o.   MRN: [id number]  Chief Complaint  Patient presents with   Hypotension    HPI Patient is in today for an office visit.  She complains that her blood pressure is low. She regularly measures her blood pressure at home and reports that her recent findings are decreased from her typical measurements. She reports feeling fatigued for the past couple of days. She usually walks 2 miles every day but has had to reduce it to 1.5 miles due to feeling fatigued easily. Her highest BP reading recorded was 112/94. She is interested in stopping one or more of her blood pressure medication to manage her symptoms. She does not take all of her blood pressure medications at the same time of the day. She is taking 300 mg irbesartan daily PO, 12.5 mg hydrochlorothiazide daily PO, 25 mg atenolol daily PO to manage her blood pressure at this time. She is interested in switching her irbesartan to losartan. She has tried amlodipine daily PO and reports that her blood pressure has a sharp drop.   BP Readings from Last 3 Encounters:  03/04/21 108/78  02/18/21 102/78  01/27/21 110/72   Pulse Readings from Last 3 Encounters:  03/04/21 97  02/18/21 88  01/27/21 75   She reports not taking her 20 mg Cymbalta daily PO regularly. She notes that work causes her to miss her medication.    Past Medical History:  Diagnosis Date   Anxiety    High cholesterol    Hypertension    Lyme disease    Thyroid disease     Past Surgical History:  Procedure Laterality Date   ABDOMINAL EXPLORATION SURGERY     APPENDECTOMY     MASTOIDECTOMY     TONSILECTOMY/ADENOIDECTOMY WITH MYRINGOTOMY      Family History  Problem Relation Age of Onset   Hypertension Mother    High  Cholesterol Mother    Heart disease Father    High Cholesterol Father    Hypertension Sister    High Cholesterol Sister    Hypertension Brother    High Cholesterol Brother    Colon cancer Neg Hx     Social History   Socioeconomic History   Marital status: Divorced    Spouse name: Not on file   Number of children: Not on file   Years of education: Not on file   Highest education level: Not on file  Occupational History   Not on file  Tobacco Use   Smoking status: Former    Types: Cigarettes    Quit date: 2006    Years since quitting: 16.6   Smokeless tobacco: Never   Tobacco comments:    socially   Building services engineer Use: Never used  Substance and Sexual Activity   Alcohol use: No   Drug use: No   Sexual activity: Not Currently    Birth control/protection: Post-menopausal  Other Topics Concern   Not on file  Social History Narrative   Not on file   Social Determinants of Health   Financial Resource Strain: Not on file  Food Insecurity: Not on file  Transportation Needs: No Transportation Needs   Lack of Transportation (Medical): No   Lack of Transportation (Non-Medical): No  Physical Activity: Not on file  Stress: Not on file  Social Connections: Not on file  Intimate Partner Violence: Not on file    Outpatient Medications Prior to Visit  Medication Sig Dispense Refill   ALPRAZolam (XANAX) 0.5 MG tablet TAKE ONE TABLET BY MOUTH FOUR TIMES A DAY (ONE TABLET EVERY MORNING, ONE TABLET AT NOON, ONE TABLET IN THE EVENING, AND ONE TABLET AT BEDTIME) 120 tablet 5   ARMOUR THYROID 15 MG tablet Take 3 tablets (45 mg total) by mouth daily. 90 tablet 3   atenolol (TENORMIN) 25 MG tablet Take 1 tablet by mouth twice daily 60 tablet 3   Cholecalciferol (VITAMIN D) 10 MCG/ML LIQD Take by mouth.     DULoxetine (CYMBALTA) 20 MG capsule Take 1 capsule (20 mg total) by mouth daily. 90 capsule 1   ELDERBERRY PO Take by mouth.     famotidine (PEPCID) 20 MG tablet Take 1  tablet (20 mg total) by mouth 2 (two) times daily. 60 tablet 2   hydrochlorothiazide (MICROZIDE) 12.5 MG capsule Take 1 capsule by mouth once daily 30 capsule 0   hydrOXYzine (VISTARIL) 25 MG capsule TAKE ONE CAPSULE BY MOUTH TWICE TIME DAILY AS NEEDED 60 capsule 5   montelukast (SINGULAIR) 10 MG tablet Take 1 tablet (10 mg total) by mouth at bedtime. 30 tablet 0   PREBIOTIC PRODUCT PO Take by mouth. Mix in shake and drink once a day.     silver sulfADIAZINE (SILVADENE) 1 % cream Apply 1 application topically daily. 50 g 0   tizanidine (ZANAFLEX) 2 MG capsule Take 1 capsule (2 mg total) by mouth 3 (three) times daily. 30 capsule 1   Vitamin D, Ergocalciferol, (DRISDOL) 1.25 MG (50000 UNIT) CAPS capsule Take 1 capsule (50,000 Units total) by mouth every 7 (seven) days. 12 capsule 0   irbesartan (AVAPRO) 300 MG tablet Take 1 tablet by mouth once daily 30 tablet 0   No facility-administered medications prior to visit.    Allergies  Allergen Reactions   Ceftriaxone Sodium In Dextrose Other (See Comments)    "heart races and cannot breathe"   Guaifenesin & Derivatives Other (See Comments)    "heart races, dizziness, light-headedness"   Meperidine Other (See Comments)    "heart races"   Other Rash   Penicillins Hives   Pseudoephedrine Other (See Comments)    "heart races and high blood pressure"   Rosuvastatin Calcium Nausea And Vomiting    Muscle aches    Simvastatin Other (See Comments)    muscle aching    Ciprofloxacin    Rocephin [Ceftriaxone] Itching    Review of Systems  Constitutional:  Positive for malaise/fatigue. Negative for fever.  HENT:  Negative for congestion.   Eyes:  Negative for blurred vision.  Respiratory:  Negative for cough and shortness of breath.   Cardiovascular:  Negative for chest pain, palpitations and leg swelling.  Gastrointestinal:  Negative for vomiting.  Musculoskeletal:  Negative for back pain.  Skin:  Negative for rash.  Neurological:  Negative  for loss of consciousness and headaches.      Objective:    Physical Exam Vitals and nursing note reviewed.  Constitutional:      General: She is not in acute distress.    Appearance: Normal appearance. She is not ill-appearing.  HENT:     Head: Normocephalic and atraumatic.     Right Ear: External ear normal.     Left Ear: External ear normal.  Eyes:  Extraocular Movements: Extraocular movements intact.     Pupils: Pupils are equal, round, and reactive to light.  Cardiovascular:     Rate and Rhythm: Normal rate and regular rhythm.     Heart sounds: Normal heart sounds. No murmur heard.   No gallop.  Pulmonary:     Effort: Pulmonary effort is normal. No respiratory distress.     Breath sounds: Normal breath sounds. No wheezing or rales.  Musculoskeletal:        General: Normal range of motion.  Skin:    General: Skin is warm and dry.  Neurological:     General: No focal deficit present.     Mental Status: She is alert and oriented to person, place, and time.  Psychiatric:        Mood and Affect: Mood normal.        Behavior: Behavior normal.        Thought Content: Thought content normal.        Judgment: Judgment normal.    BP 108/78 (BP Location: Right Arm, Patient Position: Sitting, Cuff Size: Large)   Pulse 97   Temp 98.6 F (37 C) (Oral)   Resp 18   Ht 5\' 6"  (1.676 m)   Wt 208 lb (94.3 kg)   SpO2 98%   BMI 33.57 kg/m  Wt Readings from Last 3 Encounters:  03/04/21 208 lb (94.3 kg)  02/18/21 208 lb 9.6 oz (94.6 kg)  01/27/21 208 lb (94.3 kg)    Diabetic Foot Exam - Simple   No data filed    Lab Results  Component Value Date   WBC 6.8 12/06/2020   HGB 13.8 12/06/2020   HCT 40.3 12/06/2020   PLT 328.0 12/06/2020   GLUCOSE 80 12/06/2020   CHOL 292 (H) 10/15/2020   TRIG 181.0 (H) 10/15/2020   HDL 66.80 10/15/2020   LDLCALC 189 (H) 10/15/2020   ALT 19 12/06/2020   AST 16 12/06/2020   NA 140 12/06/2020   K 4.2 12/06/2020   CL 101 12/06/2020    CREATININE 1.01 12/06/2020   BUN 10 12/06/2020   CO2 29 12/06/2020   TSH 1.60 12/06/2020   MICROALBUR <0.7 12/11/2019    Lab Results  Component Value Date   TSH 1.60 12/06/2020   Lab Results  Component Value Date   WBC 6.8 12/06/2020   HGB 13.8 12/06/2020   HCT 40.3 12/06/2020   MCV 90.1 12/06/2020   PLT 328.0 12/06/2020   Lab Results  Component Value Date   NA 140 12/06/2020   K 4.2 12/06/2020   CO2 29 12/06/2020   GLUCOSE 80 12/06/2020   BUN 10 12/06/2020   CREATININE 1.01 12/06/2020   BILITOT 0.5 12/06/2020   ALKPHOS 83 12/06/2020   AST 16 12/06/2020   ALT 19 12/06/2020   PROT 6.6 12/06/2020   ALBUMIN 4.2 12/06/2020   CALCIUM 9.6 12/06/2020   ANIONGAP 11 02/01/2020   GFR 62.75 12/06/2020   Lab Results  Component Value Date   CHOL 292 (H) 10/15/2020   Lab Results  Component Value Date   HDL 66.80 10/15/2020   Lab Results  Component Value Date   LDLCALC 189 (H) 10/15/2020   Lab Results  Component Value Date   TRIG 181.0 (H) 10/15/2020   Lab Results  Component Value Date   CHOLHDL 4 10/15/2020   No results found for: HGBA1C     Assessment & Plan:   Problem List Items Addressed This Visit  Unprioritized   Essential hypertension    D/c avapro Start losartan con't atenolol F/u 2-3 weeks       Relevant Medications   losartan (COZAAR) 100 MG tablet   Other Visit Diagnoses     Primary hypertension    -  Primary   Relevant Medications   losartan (COZAAR) 100 MG tablet   Other fatigue            Meds ordered this encounter  Medications   losartan (COZAAR) 100 MG tablet    Sig: Take 1 tablet (100 mg total) by mouth daily.    Dispense:  90 tablet    Refill:  1    I, Donato Schultz, DO, personally preformed the services described in this documentation.  All medical record entries made by the scribe were at my direction and in my presence.  I have reviewed the chart and discharge instructions (if applicable) and agree that  the record reflects my personal performance and is accurate and complete. 03/04/2021   I,Shehryar Baig,acting as a scribe for Donato Schultz, DO.,have documented all relevant documentation on the behalf of Donato Schultz, DO,as directed by  Donato Schultz, DO while in the presence of Donato Schultz, DO.    Donato Schultz, DO

## 2021-03-04 NOTE — Patient Instructions (Signed)
https://www.nhlbi.nih.gov/files/docs/public/heart/dash_brief.pdf">  DASH Eating Plan DASH stands for Dietary Approaches to Stop Hypertension. The DASH eating plan is a healthy eating plan that has been shown to: Reduce high blood pressure (hypertension). Reduce your risk for type 2 diabetes, heart disease, and stroke. Help with weight loss. What are tips for following this plan? Reading food labels Check food labels for the amount of salt (sodium) per serving. Choose foods with less than 5 percent of the Daily Value of sodium. Generally, foods with less than 300 milligrams (mg) of sodium per serving fit into this eating plan. To find whole grains, look for the word "whole" as the first word in the ingredient list. Shopping Buy products labeled as "low-sodium" or "no salt added." Buy fresh foods. Avoid canned foods and pre-made or frozen meals. Cooking Avoid adding salt when cooking. Use salt-free seasonings or herbs instead of table salt or sea salt. Check with your health care provider or pharmacist before using salt substitutes. Do not fry foods. Cook foods using healthy methods such as baking, boiling, grilling, roasting, and broiling instead. Cook with heart-healthy oils, such as olive, canola, avocado, soybean, or sunflower oil. Meal planning  Eat a balanced diet that includes: 4 or more servings of fruits and 4 or more servings of vegetables each day. Try to fill one-half of your plate with fruits and vegetables. 6-8 servings of whole grains each day. Less than 6 oz (170 g) of lean meat, poultry, or fish each day. A 3-oz (85-g) serving of meat is about the same size as a deck of cards. One egg equals 1 oz (28 g). 2-3 servings of low-fat dairy each day. One serving is 1 cup (237 mL). 1 serving of nuts, seeds, or beans 5 times each week. 2-3 servings of heart-healthy fats. Healthy fats called omega-3 fatty acids are found in foods such as walnuts, flaxseeds, fortified milks, and eggs.  These fats are also found in cold-water fish, such as sardines, salmon, and mackerel. Limit how much you eat of: Canned or prepackaged foods. Food that is high in trans fat, such as some fried foods. Food that is high in saturated fat, such as fatty meat. Desserts and other sweets, sugary drinks, and other foods with added sugar. Full-fat dairy products. Do not salt foods before eating. Do not eat more than 4 egg yolks a week. Try to eat at least 2 vegetarian meals a week. Eat more home-cooked food and less restaurant, buffet, and fast food.  Lifestyle When eating at a restaurant, ask that your food be prepared with less salt or no salt, if possible. If you drink alcohol: Limit how much you use to: 0-1 drink a day for women who are not pregnant. 0-2 drinks a day for men. Be aware of how much alcohol is in your drink. In the [place] one drink equals one 12 oz bottle of beer (355 mL), one 5 oz glass of wine (148 mL), or one 1 oz glass of hard liquor (44 mL). General information Avoid eating more than 2,300 mg of salt a day. If you have hypertension, you may need to reduce your sodium intake to 1,500 mg a day. Work with your health care provider to maintain a healthy body weight or to lose weight. Ask what an ideal weight is for you. Get at least 30 minutes of exercise that causes your heart to beat faster (aerobic exercise) most days of the week. Activities may include walking, swimming, or biking. Work with your health care provider   or dietitian to adjust your eating plan to your individual calorie needs. What foods should I eat? Fruits All fresh, dried, or frozen fruit. Canned fruit in natural juice (without addedsugar). Vegetables Fresh or frozen vegetables (raw, steamed, roasted, or grilled). Low-sodium or reduced-sodium tomato and vegetable juice. Low-sodium or reduced-sodium tomatosauce and tomato paste. Low-sodium or reduced-sodium canned vegetables. Grains Whole-grain or  whole-wheat bread. Whole-grain or whole-wheat pasta. Brown rice. Oatmeal. Quinoa. Bulgur. Whole-grain and low-sodium cereals. Pita bread.Low-fat, low-sodium crackers. Whole-wheat flour tortillas. Meats and other proteins Skinless chicken or turkey. Ground chicken or turkey. Pork with fat trimmed off. Fish and seafood. Egg whites. Dried beans, peas, or lentils. Unsalted nuts, nut butters, and seeds. Unsalted canned beans. Lean cuts of beef with fat trimmed off. Low-sodium, lean precooked or cured meat, such as sausages or meatloaves. Dairy Low-fat (1%) or fat-free (skim) milk. Reduced-fat, low-fat, or fat-free cheeses. Nonfat, low-sodium ricotta or cottage cheese. Low-fat or nonfatyogurt. Low-fat, low-sodium cheese. Fats and oils Soft margarine without trans fats. Vegetable oil. Reduced-fat, low-fat, or light mayonnaise and salad dressings (reduced-sodium). Canola, safflower, olive, avocado, soybean, andsunflower oils. Avocado. Seasonings and condiments Herbs. Spices. Seasoning mixes without salt. Other foods Unsalted popcorn and pretzels. Fat-free sweets. The items listed above may not be a complete list of foods and beverages you can eat. Contact a dietitian for more information. What foods should I avoid? Fruits Canned fruit in a light or heavy syrup. Fried fruit. Fruit in cream or buttersauce. Vegetables Creamed or fried vegetables. Vegetables in a cheese sauce. Regular canned vegetables (not low-sodium or reduced-sodium). Regular canned tomato sauce and paste (not low-sodium or reduced-sodium). Regular tomato and vegetable juice(not low-sodium or reduced-sodium). Pickles. Olives. Grains Baked goods made with fat, such as croissants, muffins, or some breads. Drypasta or rice meal packs. Meats and other proteins Fatty cuts of meat. Ribs. Fried meat. Bacon. Bologna, salami, and other precooked or cured meats, such as sausages or meat loaves. Fat from the back of a pig (fatback). Bratwurst.  Salted nuts and seeds. Canned beans with added salt. Canned orsmoked fish. Whole eggs or egg yolks. Chicken or turkey with skin. Dairy Whole or 2% milk, cream, and half-and-half. Whole or full-fat cream cheese. Whole-fat or sweetened yogurt. Full-fat cheese. Nondairy creamers. Whippedtoppings. Processed cheese and cheese spreads. Fats and oils Butter. Stick margarine. Lard. Shortening. Ghee. Bacon fat. Tropical oils, suchas coconut, palm kernel, or palm oil. Seasonings and condiments Onion salt, garlic salt, seasoned salt, table salt, and sea salt. Worcestershire sauce. Tartar sauce. Barbecue sauce. Teriyaki sauce. Soy sauce, including reduced-sodium. Steak sauce. Canned and packaged gravies. Fish sauce. Oyster sauce. Cocktail sauce. Store-bought horseradish. Ketchup. Mustard. Meat flavorings and tenderizers. Bouillon cubes. Hot sauces. Pre-made or packaged marinades. Pre-made or packaged taco seasonings. Relishes. Regular saladdressings. Other foods Salted popcorn and pretzels. The items listed above may not be a complete list of foods and beverages you should avoid. Contact a dietitian for more information. Where to find more information National Heart, Lung, and Blood Institute: www.nhlbi.nih.gov American Heart Association: www.heart.org Academy of Nutrition and Dietetics: www.eatright.org National Kidney Foundation: www.kidney.org Summary The DASH eating plan is a healthy eating plan that has been shown to reduce high blood pressure (hypertension). It may also reduce your risk for type 2 diabetes, heart disease, and stroke. When on the DASH eating plan, aim to eat more fresh fruits and vegetables, whole grains, lean proteins, low-fat dairy, and heart-healthy fats. With the DASH eating plan, you should limit salt (sodium) intake to 2,300   mg a day. If you have hypertension, you may need to reduce your sodium intake to 1,500 mg a day. Work with your health care provider or dietitian to adjust  your eating plan to your individual calorie needs. This information is not intended to replace advice given to you by your health care provider. Make sure you discuss any questions you have with your healthcare provider. Document Revised: 05/26/2019 Document Reviewed: 05/26/2019 Elsevier Patient Education  2022 Elsevier Inc.  

## 2021-03-04 NOTE — Assessment & Plan Note (Signed)
D/c avapro Start losartan con't atenolol F/u 2-3 weeks

## 2021-03-05 ENCOUNTER — Telehealth: Payer: Self-pay

## 2021-03-05 LAB — COMPREHENSIVE METABOLIC PANEL
ALT: 17 U/L (ref 0–35)
AST: 13 U/L (ref 0–37)
Albumin: 4 g/dL (ref 3.5–5.2)
Alkaline Phosphatase: 92 U/L (ref 39–117)
BUN: 16 mg/dL (ref 6–23)
CO2: 30 mEq/L (ref 19–32)
Calcium: 9.5 mg/dL (ref 8.4–10.5)
Chloride: 101 mEq/L (ref 96–112)
Creatinine, Ser: 1.1 mg/dL (ref 0.40–1.20)
GFR: 56.55 mL/min — ABNORMAL LOW (ref >60.00)
Glucose, Bld: 92 mg/dL (ref 70–99)
Potassium: 4.7 mEq/L (ref 3.5–5.1)
Sodium: 139 mEq/L (ref 135–145)
Total Bilirubin: 0.6 mg/dL (ref 0.2–1.2)
Total Protein: 6.5 g/dL (ref 6.0–8.3)

## 2021-03-05 LAB — CBC WITH DIFFERENTIAL/PLATELET
Basophils Absolute: 0.1 10*3/uL (ref 0.0–0.1)
Basophils Relative: 0.9 % (ref 0.0–3.0)
Eosinophils Absolute: 0.1 10*3/uL (ref 0.0–0.7)
Eosinophils Relative: 1.7 % (ref 0.0–5.0)
HCT: 40.4 % (ref 36.0–46.0)
Hemoglobin: 13.7 g/dL (ref 12.0–15.0)
Lymphocytes Relative: 21.7 % (ref 12.0–46.0)
Lymphs Abs: 1.3 10*3/uL (ref 0.7–4.0)
MCHC: 34 g/dL (ref 30.0–36.0)
MCV: 89.7 fl (ref 78.0–100.0)
Monocytes Absolute: 0.4 10*3/uL (ref 0.1–1.0)
Monocytes Relative: 6.5 % (ref 3.0–12.0)
Neutro Abs: 4.3 10*3/uL (ref 1.4–7.7)
Neutrophils Relative %: 69.2 % (ref 43.0–77.0)
Platelets: 330 10*3/uL (ref 150.0–400.0)
RBC: 4.5 Mil/uL (ref 3.87–5.11)
RDW: 13.6 % (ref 11.5–15.5)
WBC: 6.2 10*3/uL (ref 4.0–10.5)

## 2021-03-05 LAB — VITAMIN B12: Vitamin B-12: 484 pg/mL (ref 211–911)

## 2021-03-05 LAB — TSH: TSH: 3.05 u[IU]/mL (ref 0.35–5.50)

## 2021-03-05 LAB — VITAMIN D 25 HYDROXY (VIT D DEFICIENCY, FRACTURES): VITD: 37.95 ng/mL (ref 30.00–100.00)

## 2021-03-05 NOTE — Telephone Encounter (Signed)
Pt called stating she was charged $75 for her self pay visit on 03/04/21.  I advised patient that would have been correct given the self pay discount with no insurance.  Pt stated she has an agreement with [facility] to only pay $20 at her visits and pay more at the end of the week.  I advised pt that may be an agreement she has with the collection agency, but that is not an agreement [facility] has in place.  Pt stated she was going to contact her bank to stop payment on the amount she paid yesterday and contact an attorney because she felt like she was rushed through the payment process to get back to be seen.  I advised pt she had been given patient financial assistance paperwork in the past from the ladies at the front desk to complete and that could potentially help her should she complete it and get it turned in.  Pt asked if Dr. Laury Axon was made aware of the this billing issue and I advised providers do not get involved with billing concerns.

## 2021-03-11 ENCOUNTER — Other Ambulatory Visit: Payer: Self-pay | Admitting: Psychiatry

## 2021-03-11 DIAGNOSIS — F411 Generalized anxiety disorder: Secondary | ICD-10-CM

## 2021-03-11 DIAGNOSIS — F419 Anxiety disorder, unspecified: Secondary | ICD-10-CM

## 2021-03-11 DIAGNOSIS — Z8659 Personal history of other mental and behavioral disorders: Secondary | ICD-10-CM

## 2021-03-18 ENCOUNTER — Ambulatory Visit: Payer: Self-pay | Admitting: Family Medicine

## 2021-03-18 ENCOUNTER — Telehealth: Payer: Self-pay

## 2021-03-18 NOTE — Telephone Encounter (Signed)
Pt called to cancel her appointment with PCP same day/less than 24 hours @ 10:20 am.  Patient stated she did not have the money for today's visit.

## 2021-03-21 ENCOUNTER — Telehealth: Payer: Self-pay | Admitting: Family Medicine

## 2021-03-21 ENCOUNTER — Other Ambulatory Visit: Payer: Self-pay | Admitting: Family Medicine

## 2021-03-21 DIAGNOSIS — E559 Vitamin D deficiency, unspecified: Secondary | ICD-10-CM

## 2021-03-21 MED ORDER — VITAMIN D (ERGOCALCIFEROL) 1.25 MG (50000 UNIT) PO CAPS: 50000.0000 [IU] | ORAL_CAPSULE | ORAL | 0 refills | Status: DC

## 2021-03-21 NOTE — Telephone Encounter (Signed)
Refill sent.

## 2021-03-21 NOTE — Telephone Encounter (Signed)
Medication: Vitamin D, Ergocalciferol, (DRISDOL) 1.25 MG (50000 UNIT) CAPS capsule   Has the patient contacted their pharmacy? No. (If no, request that the patient contact the pharmacy for the refill.) (If yes, when and what did the pharmacy advise?)  Preferred Pharmacy (with phone number or street name): [facility] [address] - [place], [place] - [address]  [address], [address], [place] [address]  Phone:  [phone]    Agent: Please be advised that RX refills may take up to 3 business days. We ask that you follow-up with your pharmacy.

## 2021-03-27 ENCOUNTER — Other Ambulatory Visit: Payer: Self-pay | Admitting: Family Medicine

## 2021-03-27 DIAGNOSIS — I10 Essential (primary) hypertension: Secondary | ICD-10-CM

## 2021-03-28 ENCOUNTER — Encounter: Payer: Self-pay | Admitting: Family Medicine

## 2021-03-28 ENCOUNTER — Other Ambulatory Visit: Payer: Self-pay | Admitting: Family Medicine

## 2021-03-28 ENCOUNTER — Telehealth: Payer: Self-pay | Admitting: Family Medicine

## 2021-03-28 DIAGNOSIS — I10 Essential (primary) hypertension: Secondary | ICD-10-CM

## 2021-03-28 MED ORDER — IRBESARTAN 300 MG PO TABS: 300.0000 mg | ORAL_TABLET | Freq: Every day | ORAL | 1 refills | Status: DC

## 2021-03-28 NOTE — Telephone Encounter (Signed)
Pt called to check status of message- reviewed med list and ov note from 8/30- she was supposed to d/c irbesartan and start losartan 100mg - she did not do this- she decided to cut her irbesartan 300mg  in half and take that- her bp is doing good and is out of irbesartan- she will need it refilled before the weekend. Informed that we would have to find out what Dr. would like for her to do but she is with patients in clinic right now. Pt verbalized understanding but requesting call back before EOD.

## 2021-03-28 NOTE — Telephone Encounter (Signed)
Pt need meds refill. Meds say discontinued. Pt currently has no meds for blood pressure.  irbesartan (AVAPRO) 300 MG tablet  [id number] - [place], [place] - [address]  [address], [place] [address]  Phone:  [phone]  Fax:  [phone]

## 2021-03-28 NOTE — Telephone Encounter (Signed)
Pt called again she forgot to mention in previous 2 calls that with taking irbesartan 300mg  1/2 tab bid her blood pressure is around 114/86 and climbs up to about 90s in the afternoon. She is very anxious about a call back because she currently doesn't have any medications as she is out.

## 2021-03-28 NOTE — Telephone Encounter (Signed)
Pt called again regarding meds. She stated her blood pressure constantly increase without meds. Blood pressure currently 125/94.

## 2021-03-28 NOTE — Telephone Encounter (Signed)
Pt is taking ibesartan 300mg  1/2 tab bid. She did not start/switch to losartan because she was sure you told her to cut the pills in 1/2 twice daily instead.

## 2021-04-01 ENCOUNTER — Telehealth: Payer: Self-pay | Admitting: Family Medicine

## 2021-04-01 NOTE — Telephone Encounter (Signed)
Medication: irbesartan (AVAPRO) 300 MG tablet  Has the patient contacted their pharmacy? No. (If no, request that the patient contact the pharmacy for the refill.) (If yes, when and what did the pharmacy advise?)  Preferred Pharmacy (with phone number or street name):  [id number] - [place], [place] - [address]  [address], [place] [address]  Phone:  [phone]  Fax:  [phone]

## 2021-04-01 NOTE — Telephone Encounter (Signed)
Refill sent on 03/28/21. Message to pt via Mychart

## 2021-04-02 ENCOUNTER — Telehealth: Payer: Self-pay | Admitting: Family Medicine

## 2021-04-02 ENCOUNTER — Other Ambulatory Visit: Payer: Self-pay | Admitting: Family Medicine

## 2021-04-02 MED ORDER — EPINEPHRINE 0.3 MG/0.3ML IJ SOAJ: 0.3000 mg | INTRAMUSCULAR | 0 refills | Status: AC | PRN

## 2021-04-02 NOTE — Telephone Encounter (Signed)
Please advise 

## 2021-04-02 NOTE — Telephone Encounter (Signed)
Pt. Called in and stated she is about to start Bee Therapy and Bee keeping in the next 2weeks. She wanted to know if she could receive an Epipen to keep on hand incase she has an reaction due to being stung.

## 2021-04-02 NOTE — Telephone Encounter (Signed)
LMOM informing Pt that PCP has sent in Epipen for her.

## 2021-04-09 ENCOUNTER — Other Ambulatory Visit: Payer: Self-pay | Admitting: Psychiatry

## 2021-04-09 DIAGNOSIS — I10 Essential (primary) hypertension: Secondary | ICD-10-CM

## 2021-04-09 NOTE — Telephone Encounter (Signed)
Please review message

## 2021-04-09 NOTE — Telephone Encounter (Signed)
This is for the Atenolol

## 2021-04-09 NOTE — Telephone Encounter (Signed)
Next appt: 12/15

## 2021-04-09 NOTE — Telephone Encounter (Signed)
Pt LVM to have the refill of Atenolol sent to [facility].  [facility] [address] - [place], [place] - [address]

## 2021-04-09 NOTE — Telephone Encounter (Signed)
Pt LVM again at 4:15pm and 4:38 pm.  These messages are saying she wants the responsibility for the script to be sent to Dr. Sofie Rower at [facility] on [address]  They are waiting to get the script from here since Dr.Cottle wanted it filled by her PCP.  She is out of meds.

## 2021-04-10 ENCOUNTER — Telehealth: Payer: Self-pay | Admitting: Family Medicine

## 2021-04-10 DIAGNOSIS — I10 Essential (primary) hypertension: Secondary | ICD-10-CM

## 2021-04-10 NOTE — Telephone Encounter (Signed)
Patient calling stating that she is needing a refill  Dr Jennelle Human stated that PCP, Lowne needs to start refilling & sending in medcation  Please advise:   Pharmacy: [facility] in [place]   atenolol (TENORMIN) 25 MG tablet [[id number]]

## 2021-04-11 MED ORDER — ATENOLOL 25 MG PO TABS: 25.0000 mg | ORAL_TABLET | Freq: Two times a day (BID) | ORAL | 3 refills | Status: DC

## 2021-04-11 NOTE — Telephone Encounter (Signed)
Refill sent.

## 2021-04-25 ENCOUNTER — Telehealth: Payer: Self-pay

## 2021-04-25 NOTE — Telephone Encounter (Signed)
FYI. Pt has appt on Tuesday

## 2021-04-25 NOTE — Telephone Encounter (Signed)
Nurse Assessment Nurse: Jenna Kocher, RN, Adriana Date/Time (Eastern Time): 04/24/2021 7:29:35 PM Confirm and document reason for call. If symptomatic, describe symptoms. ---pt states she has been getting different bp readings in each arm over last 4 days. 128/94 and 140s systolic on the other arm. does not remember if she took her medication today. pt has appt next week. pt bp 117/95 left arm 116/83 on right arm. fever earlier at 101, unknown temp now. reports a headache. covid (-), Does the patient have any new or worsening symptoms? ---Yes Will a triage be completed? ---Yes Related visit to physician within the last 2 weeks? ---No Does the PT have any chronic conditions? (i.e. diabetes, asthma, this includes High risk factors for pregnancy, etc.) ---Yes List chronic conditions. ---hypothyroid htn lyme tachycardia depression anxiety Is this a behavioral health or substance abuse call? ---No Guidelines Guideline Title Affirmed Question Affirmed Notes Nurse Date/Time (Eastern Time) Blood Pressure - High [1] Systolic BP < 130 with Diastolic < 80 AND [2] taking BP medications Jenna Arroyo, Jenna Arroyo 04/24/2021 7:46:52 PM PLEASE NOTE: All timestamps contained within this report are represented as [place] Standard Time. CONFIDENTIALTY NOTICE: This fax transmission is intended only for the addressee. It contains information that is legally privileged, confidential or otherwise protected from use or disclosure. If you are not the intended recipient, you are strictly prohibited from reviewing, disclosing, copying using or disseminating any of this information or taking any action in reliance on or regarding this information. If you have received this fax in error, please notify us immediately by telephone so that we can arrange for its return to [place]. Phone: [phone], Toll-Free: [phone], Fax: [phone] Page: 2 of 2 Call Id: [id number] Disp. Time [name] Disposition Final  User 04/24/2021 7:50:44 PM Home Care Yes Jenna Kocher, RN, Jenna Arroyo Caller Disagree/Comply Comply Caller Understands Yes PreDisposition Call Doctor Care Advice Given Per Guideline HOME CARE: * You should be able to treat this at home. CALL BACK IF: * Your blood pressure is 130/80 or higher * You become worse CARE ADVICE given per High Blood Pressure (Adult) guideline

## 2021-04-29 ENCOUNTER — Ambulatory Visit: Payer: Self-pay | Admitting: Family Medicine

## 2021-05-05 ENCOUNTER — Encounter: Payer: Self-pay | Admitting: Family Medicine

## 2021-05-05 ENCOUNTER — Ambulatory Visit (INDEPENDENT_AMBULATORY_CARE_PROVIDER_SITE_OTHER): Payer: Self-pay | Admitting: Family Medicine

## 2021-05-05 ENCOUNTER — Other Ambulatory Visit: Payer: Self-pay

## 2021-05-05 VITALS — BP 118/72 | HR 79 | Temp 98.2°F | Ht 66.0 in | Wt 211.2 lb

## 2021-05-05 DIAGNOSIS — I10 Essential (primary) hypertension: Secondary | ICD-10-CM

## 2021-05-05 MED ORDER — IRBESARTAN-HYDROCHLOROTHIAZIDE 150-12.5 MG PO TABS
2.0000 | ORAL_TABLET | Freq: Every day | ORAL | 1 refills | Status: DC
Start: 2021-05-05 — End: 2021-12-15

## 2021-05-05 NOTE — Assessment & Plan Note (Signed)
Pt was taking avapro 300 mg 1/2 po bid  She felt her bp was better when avapro and hctz were together Changed to avapro 150/ 12.5 1 po bid  F/u 2-3 weeks for bp check

## 2021-05-05 NOTE — Patient Instructions (Signed)

## 2021-05-05 NOTE — Progress Notes (Signed)
Established Patient Office Visit  Subjective:  Patient ID: Jenna Arroyo, female    DOB: [birth year]  Age: 55 y.o. MRN: [id number]  CC:  Chief Complaint  Patient presents with   Hypertension    Concerns about elevated and low BP readings x 10 days.     HPI Jenna Arroyo presents for f/u bp--- its been irregular when she checks it at home and higher in one arm than the other    no other complaints   Past Medical History:  Diagnosis Date   Anxiety    High cholesterol    Hypertension    Lyme disease    Thyroid disease     Past Surgical History:  Procedure Laterality Date   ABDOMINAL EXPLORATION SURGERY     APPENDECTOMY     MASTOIDECTOMY     TONSILECTOMY/ADENOIDECTOMY WITH MYRINGOTOMY      Family History  Problem Relation Age of Onset   Hypertension Mother    High Cholesterol Mother    Heart disease Father    High Cholesterol Father    Hypertension Sister    High Cholesterol Sister    Hypertension Brother    High Cholesterol Brother    Colon cancer Neg Hx     Social History   Socioeconomic History   Marital status: Divorced    Spouse name: Not on file   Number of children: Not on file   Years of education: Not on file   Highest education level: Not on file  Occupational History   Not on file  Tobacco Use   Smoking status: Former    Types: Cigarettes    Quit date: 2006    Years since quitting: 16.8   Smokeless tobacco: Never   Tobacco comments:    socially   Scientific laboratory technician Use: Never used  Substance and Sexual Activity   Alcohol use: No   Drug use: No   Sexual activity: Not Currently    Birth control/protection: Post-menopausal  Other Topics Concern   Not on file  Social History Narrative   Not on file   Social Determinants of Health   Financial Resource Strain: Not on file  Food Insecurity: Not on file  Transportation Needs: No Transportation Needs   Lack of Transportation (Medical): No   Lack of Transportation (Non-Medical): No   Physical Activity: Not on file  Stress: Not on file  Social Connections: Not on file  Intimate Partner Violence: Not on file    Outpatient Medications Prior to Visit  Medication Sig Dispense Refill   ALPRAZolam (XANAX) 0.5 MG tablet TAKE ONE TABLET BY MOUTH EVERY MORNING, TAKE ONE TABLET BY MOUTH AT NOON, TAKE ONE TABLET BY MOUTH EVERY EVENING AND TAKE ONE TABLET BY MOUTH AT BEDTIME 120 tablet 5   ARMOUR THYROID 15 MG tablet Take 3 tablets (45 mg total) by mouth daily. 90 tablet 3   atenolol (TENORMIN) 25 MG tablet Take 1 tablet (25 mg total) by mouth 2 (two) times daily. 60 tablet 3   Cholecalciferol (VITAMIN D) 10 MCG/ML LIQD Take by mouth.     DULoxetine (CYMBALTA) 20 MG capsule Take 1 capsule (20 mg total) by mouth daily. 90 capsule 1   ELDERBERRY PO Take by mouth.     EPINEPHrine (EPIPEN 2-PAK) 0.3 mg/0.3 mL IJ SOAJ injection Inject 0.3 mg into the muscle as needed for anaphylaxis. 1 each 0   hydrOXYzine (VISTARIL) 25 MG capsule TAKE ONE CAPSULE BY MOUTH ONE TIME DAILY AT  BEDTIME AS NEEDED 30 capsule 0   PREBIOTIC PRODUCT PO Take by mouth. Mix in shake and drink once a day.     silver sulfADIAZINE (SILVADENE) 1 % cream Apply 1 application topically daily. 50 g 0   Vitamin D, Ergocalciferol, (DRISDOL) 1.25 MG (50000 UNIT) CAPS capsule Take 1 capsule (50,000 Units total) by mouth every 7 (seven) days. 12 capsule 0   irbesartan (AVAPRO) 300 MG tablet Take 1 tablet (300 mg total) by mouth daily. 90 tablet 1   famotidine (PEPCID) 20 MG tablet Take 1 tablet (20 mg total) by mouth 2 (two) times daily. (Patient not taking: Reported on 05/05/2021) 60 tablet 2   hydrochlorothiazide (MICROZIDE) 12.5 MG capsule Take 1 capsule by mouth once daily (Patient not taking: Reported on 05/05/2021) 30 capsule 0   losartan (COZAAR) 100 MG tablet Take 1 tablet (100 mg total) by mouth daily. (Patient not taking: Reported on 05/05/2021) 90 tablet 1   montelukast (SINGULAIR) 10 MG tablet Take 1 tablet (10 mg  total) by mouth at bedtime. (Patient not taking: Reported on 05/05/2021) 30 tablet 0   tizanidine (ZANAFLEX) 2 MG capsule Take 1 capsule (2 mg total) by mouth 3 (three) times daily. (Patient not taking: Reported on 05/05/2021) 30 capsule 1   No facility-administered medications prior to visit.    Allergies  Allergen Reactions   Ceftriaxone Sodium In Dextrose Other (See Comments)    "heart races and cannot breathe"   Guaifenesin & Derivatives Other (See Comments)    "heart races, dizziness, light-headedness"   Meperidine Other (See Comments)    "heart races"   Other Rash   Penicillins Hives   Pseudoephedrine Other (See Comments)    "heart races and high blood pressure"   Rosuvastatin Calcium Nausea And Vomiting    Muscle aches    Simvastatin Other (See Comments)    muscle aching    Ciprofloxacin    Rocephin [Ceftriaxone] Itching    ROS Review of Systems  Constitutional:  Negative for appetite change, diaphoresis, fatigue and unexpected weight change.  Eyes:  Negative for pain, redness and visual disturbance.  Respiratory:  Negative for cough, chest tightness, shortness of breath and wheezing.   Cardiovascular:  Negative for chest pain, palpitations and leg swelling.  Endocrine: Negative for cold intolerance, heat intolerance, polydipsia, polyphagia and polyuria.  Genitourinary:  Negative for difficulty urinating, dysuria and frequency.  Neurological:  Negative for dizziness, light-headedness, numbness and headaches.     Objective:    Physical Exam Vitals and nursing note reviewed.  Constitutional:      Appearance: She is well-developed.  HENT:     Head: Normocephalic and atraumatic.  Eyes:     Conjunctiva/sclera: Conjunctivae normal.  Neck:     Thyroid: No thyromegaly.     Vascular: No carotid bruit or JVD.  Cardiovascular:     Rate and Rhythm: Normal rate and regular rhythm.     Heart sounds: Normal heart sounds. No murmur heard. Pulmonary:     Effort: Pulmonary  effort is normal. No respiratory distress.     Breath sounds: Normal breath sounds. No wheezing or rales.  Chest:     Chest wall: No tenderness.  Musculoskeletal:     Cervical back: Normal range of motion and neck supple.  Neurological:     Mental Status: She is alert and oriented to person, place, and time.    BP 118/72 (BP Location: Right Arm, Patient Position: Sitting, Cuff Size: Large)   Pulse 79  Temp 98.2 F (36.8 C) (Temporal)   Ht 5\' 6"  (1.676 m)   Wt 211 lb 3.2 oz (95.8 kg)   SpO2 98%   BMI 34.09 kg/m  Wt Readings from Last 3 Encounters:  05/05/21 211 lb 3.2 oz (95.8 kg)  03/04/21 208 lb (94.3 kg)  02/18/21 208 lb 9.6 oz (94.6 kg)   Bp taken in R arm----  check in L arm and reading was the same   Health Maintenance Due  Topic Date Due   HIV Screening  Never done   COLONOSCOPY (Pts 45-17yrs Insurance coverage will need to be confirmed)  Never done   Zoster Vaccines- Shingrix (1 of 2) Never done   MAMMOGRAM  04/11/2020    There are no preventive care reminders to display for this patient.  Lab Results  Component Value Date   TSH 3.05 03/04/2021   Lab Results  Component Value Date   WBC 6.2 03/04/2021   HGB 13.7 03/04/2021   HCT 40.4 03/04/2021   MCV 89.7 03/04/2021   PLT 330.0 03/04/2021   Lab Results  Component Value Date   NA 139 03/04/2021   K 4.7 03/04/2021   CO2 30 03/04/2021   GLUCOSE 92 03/04/2021   BUN 16 03/04/2021   CREATININE 1.10 03/04/2021   BILITOT 0.6 03/04/2021   ALKPHOS 92 03/04/2021   AST 13 03/04/2021   ALT 17 03/04/2021   PROT 6.5 03/04/2021   ALBUMIN 4.0 03/04/2021   CALCIUM 9.5 03/04/2021   ANIONGAP 11 02/01/2020   GFR 56.55 (L) 03/04/2021   Lab Results  Component Value Date   CHOL 292 (H) 10/15/2020   Lab Results  Component Value Date   HDL 66.80 10/15/2020   Lab Results  Component Value Date   LDLCALC 189 (H) 10/15/2020   Lab Results  Component Value Date   TRIG 181.0 (H) 10/15/2020   Lab Results   Component Value Date   CHOLHDL 4 10/15/2020   No results found for: HGBA1C    Assessment & Plan:   Problem List Items Addressed This Visit       Unprioritized   Essential hypertension    Pt was taking avapro 300 mg 1/2 po bid  She felt her bp was better when avapro and hctz were together Changed to avapro 150/ 12.5 1 po bid  F/u 2-3 weeks for bp check       Relevant Medications   irbesartan-hydrochlorothiazide (AVALIDE) 150-12.5 MG tablet   Other Visit Diagnoses     Primary hypertension    -  Primary   Relevant Medications   irbesartan-hydrochlorothiazide (AVALIDE) 150-12.5 MG tablet       Meds ordered this encounter  Medications   irbesartan-hydrochlorothiazide (AVALIDE) 150-12.5 MG tablet    Sig: Take 2 tablets by mouth daily.    Dispense:  180 tablet    Refill:  1    Follow-up: Return in about 4 months (around 09/02/2021), or if symptoms worsen or fail to improve, for hypertension, annual exam, fasting.    09/04/2021, DO

## 2021-05-16 ENCOUNTER — Telehealth: Payer: Self-pay | Admitting: Family Medicine

## 2021-05-16 NOTE — Telephone Encounter (Signed)
We don't, correct?

## 2021-05-16 NOTE — Telephone Encounter (Signed)
Pt. 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 She was treated at [facility] 23yrs ago for it but doesn't want to travel to the location to be seen for it. Her father just bought a home that has mold in it and she just wanted to know if we do incase she needs it.

## 2021-05-19 ENCOUNTER — Other Ambulatory Visit: Payer: Self-pay | Admitting: Psychiatry

## 2021-05-19 DIAGNOSIS — I10 Essential (primary) hypertension: Secondary | ICD-10-CM

## 2021-05-21 NOTE — Telephone Encounter (Signed)
Attempted to call patient. Phone rang and VM did not pick up. Phone went to dead air

## 2021-06-03 ENCOUNTER — Encounter: Payer: Self-pay | Admitting: Family Medicine

## 2021-06-03 ENCOUNTER — Telehealth (INDEPENDENT_AMBULATORY_CARE_PROVIDER_SITE_OTHER): Payer: Self-pay | Admitting: Family Medicine

## 2021-06-03 ENCOUNTER — Other Ambulatory Visit: Payer: Self-pay

## 2021-06-03 DIAGNOSIS — J029 Acute pharyngitis, unspecified: Secondary | ICD-10-CM

## 2021-06-03 MED ORDER — AZITHROMYCIN 250 MG PO TABS: ORAL_TABLET | ORAL | 0 refills | Status: DC

## 2021-06-03 NOTE — Assessment & Plan Note (Signed)
All to pcn z pak Symptoms may have started with flu but its been 10 days  She also states therer are many smokers in the building and it comes in through her vents  She is allergic to the smoke  She is unable to get an aire purifiier due to cost  rto if no imprrovement

## 2021-06-03 NOTE — Patient Instructions (Signed)
Pharyngitis °Pharyngitis is inflammation of the throat (pharynx). It is a very common cause of sore throat. Pharyngitis can be caused by a bacteria, but it is usually caused by a virus. Most cases of pharyngitis get better on their own without treatment. °What are the causes? °This condition may be caused by: °Infection by viruses (viral). Viral pharyngitis spreads easily from person to person (is contagious) through coughing, sneezing, and sharing of personal items or utensils such as cups, forks, spoons, and toothbrushes. °Infection by bacteria (bacterial). Bacterial pharyngitis may be spread by touching the nose or face after coming in contact with the bacteria, or through close contact, such as kissing. °Allergies. Allergies can cause buildup of mucus in the throat (post-nasal drip), leading to inflammation and irritation. Allergies can also cause blocked nasal passages, forcing breathing through the mouth, which dries and irritates the throat. °What increases the risk? °You are more likely to develop this condition if: °You are 5-24 years old. °You are exposed to crowded environments such as daycare, school, or dormitory living. °You live in a cold climate. °You have a weakened disease-fighting (immune) system. °What are the signs or symptoms? °Symptoms of this condition vary by the cause. Common symptoms of this condition include: °Sore throat. °Fatigue. °Low-grade fever. °Stuffy nose (nasal congestion) and cough. °Headache. °Other symptoms may include: °Glands in the neck (lymph nodes) that are swollen. °Skin rashes. °Plaque-like film on the throat or tonsils. This is often a symptom of bacterial pharyngitis. °Vomiting. °Red, itchy eyes (conjunctivitis). °Loss of appetite. °Joint pain and muscle aches. °Enlarged tonsils. °How is this diagnosed? °This condition may be diagnosed based on your medical history and a physical exam. Your health care provider will ask you questions about your illness and your  symptoms. °A swab of your throat may be done to check for bacteria (rapid strep test). Other lab tests may also be done, depending on the suspected cause, but these are rare. °How is this treated? °Many times, treatment is not needed for this condition. Pharyngitis usually gets better in 3-4 days without treatment. °Bacterial pharyngitis may be treated with antibiotic medicines. °Follow these instructions at home: °Medicines °Take over-the-counter and prescription medicines only as told by your health care provider. °If you were prescribed an antibiotic medicine, take it as told by your health care provider. Do not stop taking the antibiotic even if you start to feel better. °Use throat sprays to soothe your throat as told by your health care provider. °Children can get pharyngitis. Do not give your child aspirin because of the association with Reye's syndrome. °Managing pain °To help with pain, try: °Sipping warm liquids, such as broth, herbal tea, or warm water. °Eating or drinking cold or frozen liquids, such as frozen ice pops. °Gargling with a mixture of salt and water 3-4 times a day or as needed. To make salt water, completely dissolve ½-1 tsp (3-6 g) of salt in 1 cup (237 mL) of warm water. °Sucking on hard candy or throat lozenges. °Putting a cool-mist humidifier in your bedroom at night to moisten the air. °Sitting in the bathroom with the door closed for 5-10 minutes while you run hot water in the shower. ° °General instructions ° °Do not use any products that contain nicotine or tobacco. These products include cigarettes, chewing tobacco, and vaping devices, such as e-cigarettes. If you need help quitting, ask your health care provider. °Rest as told by your health care provider. °Drink enough fluid to keep your urine pale yellow. °How   is this prevented? °To help prevent becoming infected or spreading infection: °Wash your hands often with soap and water for at least 20 seconds. If soap and water are not  available, use hand sanitizer. °Do not touch your eyes, nose, or mouth with unwashed hands, and wash hands after touching these areas. °Do not share cups or eating utensils. °Avoid close contact with people who are sick. °Contact a health care provider if: °You have large, tender lumps in your neck. °You have a rash. °You cough up green, yellow-brown, or bloody mucus. °Get help right away if: °Your neck becomes stiff. °You drool or are unable to swallow liquids. °You cannot drink or take medicines without vomiting. °You have severe pain that does not go away, even after you take medicine. °You have trouble breathing, and it is not caused by a stuffy nose. °You have new pain and swelling in your joints such as the knees, ankles, wrists, or elbows. °These symptoms may represent a serious problem that is an emergency. Do not wait to see if the symptoms will go away. Get medical help right away. Call your local emergency services (911 in the [place] Do not drive yourself to the hospital. °Summary °Pharyngitis is redness, pain, and swelling (inflammation) of the throat (pharynx). °While pharyngitis can be caused by a bacteria, the most common causes are viral. °Most cases of pharyngitis get better on their own without treatment. °Bacterial pharyngitis is treated with antibiotic medicines. °This information is not intended to replace advice given to you by your health care provider. Make sure you discuss any questions you have with your health care provider. °Document Revised: 09/18/2020 Document Reviewed: 09/18/2020 °Elsevier Patient Education © 2022 [employer] ° °

## 2021-06-03 NOTE — Progress Notes (Signed)
MyChart Video Visit    Virtual Visit via Video Note   This visit type was conducted due to national recommendations for restrictions regarding the COVID-19 Pandemic (e.g. social distancing) in an effort to limit this patient's exposure and mitigate transmission in our community. This patient is at least at moderate risk for complications without adequate follow up. This format is felt to be most appropriate for this patient at this time. Physical exam was limited by quality of the video and audio technology used for the visit. Luster Landsberg was able to get the patient set up on a video visit.  Patient location: home alone Patient and provider in visit Provider location: Office  I discussed the limitations of evaluation and management by telemedicine and the availability of in person appointments. The patient expressed understanding and agreed to proceed.  Visit Date: 06/03/2021  Today's healthcare provider: Donato Schultz, DO     Subjective:    Patient ID: Jenna Arroyo, female    DOB: 1965-07-18, 55 y.o.   MRN: [id number]  Chief Complaint  Patient presents with   Sore Throat    HPI Patient is in today for sore throat for 10 days and exhausted.   +abd pain and low grade fever 99.1, body aches    headaches and tired.   She is taking advil and tylenol alt.      Past Medical History:  Diagnosis Date   Anxiety    High cholesterol    Hypertension    Lyme disease    Thyroid disease     Past Surgical History:  Procedure Laterality Date   ABDOMINAL EXPLORATION SURGERY     APPENDECTOMY     MASTOIDECTOMY     TONSILECTOMY/ADENOIDECTOMY WITH MYRINGOTOMY      Family History  Problem Relation Age of Onset   Hypertension Mother    High Cholesterol Mother    Heart disease Father    High Cholesterol Father    Hypertension Sister    High Cholesterol Sister    Hypertension Brother    High Cholesterol Brother    Colon cancer Neg Hx     Social History    Socioeconomic History   Marital status: Divorced    Spouse name: Not on file   Number of children: Not on file   Years of education: Not on file   Highest education level: Not on file  Occupational History   Not on file  Tobacco Use   Smoking status: Former    Types: Cigarettes    Quit date: 2006    Years since quitting: 16.9   Smokeless tobacco: Never   Tobacco comments:    socially   Building services engineer Use: Never used  Substance and Sexual Activity   Alcohol use: No   Drug use: No   Sexual activity: Not Currently    Birth control/protection: Post-menopausal  Other Topics Concern   Not on file  Social History Narrative   Not on file   Social Determinants of Health   Financial Resource Strain: Not on file  Food Insecurity: Not on file  Transportation Needs: No Transportation Needs   Lack of Transportation (Medical): No   Lack of Transportation (Non-Medical): No  Physical Activity: Not on file  Stress: Not on file  Social Connections: Not on file  Intimate Partner Violence: Not on file    Outpatient Medications Prior to Visit  Medication Sig Dispense Refill   ALPRAZolam (XANAX) 0.5 MG tablet TAKE  ONE TABLET BY MOUTH EVERY MORNING, TAKE ONE TABLET BY MOUTH AT NOON, TAKE ONE TABLET BY MOUTH EVERY EVENING AND TAKE ONE TABLET BY MOUTH AT BEDTIME 120 tablet 5   ARMOUR THYROID 15 MG tablet Take 3 tablets (45 mg total) by mouth daily. 90 tablet 3   atenolol (TENORMIN) 25 MG tablet Take 1 tablet by mouth twice daily 180 tablet 0   Cholecalciferol (VITAMIN D) 10 MCG/ML LIQD Take by mouth.     DULoxetine (CYMBALTA) 20 MG capsule Take 1 capsule (20 mg total) by mouth daily. 90 capsule 1   ELDERBERRY PO Take by mouth.     EPINEPHrine (EPIPEN 2-PAK) 0.3 mg/0.3 mL IJ SOAJ injection Inject 0.3 mg into the muscle as needed for anaphylaxis. 1 each 0   hydrOXYzine (VISTARIL) 25 MG capsule TAKE ONE CAPSULE BY MOUTH ONE TIME DAILY AT BEDTIME AS NEEDED 30 capsule 0    irbesartan-hydrochlorothiazide (AVALIDE) 150-12.5 MG tablet Take 2 tablets by mouth daily. 180 tablet 1   PREBIOTIC PRODUCT PO Take by mouth. Mix in shake and drink once a day.     silver sulfADIAZINE (SILVADENE) 1 % cream Apply 1 application topically daily. 50 g 0   Vitamin D, Ergocalciferol, (DRISDOL) 1.25 MG (50000 UNIT) CAPS capsule Take 1 capsule (50,000 Units total) by mouth every 7 (seven) days. 12 capsule 0   No facility-administered medications prior to visit.    Allergies  Allergen Reactions   Ceftriaxone Sodium In Dextrose Other (See Comments)    "heart races and cannot breathe"   Guaifenesin & Derivatives Other (See Comments)    "heart races, dizziness, light-headedness"   Meperidine Other (See Comments)    "heart races"   Other Rash   Penicillins Hives   Pseudoephedrine Other (See Comments)    "heart races and high blood pressure"   Rosuvastatin Calcium Nausea And Vomiting    Muscle aches    Simvastatin Other (See Comments)    muscle aching    Ciprofloxacin    Rocephin [Ceftriaxone] Itching    Review of Systems  Constitutional:  Positive for chills, fever and malaise/fatigue.  HENT:  Positive for sore throat. Negative for congestion.   Eyes:  Negative for blurred vision.  Respiratory:  Negative for cough and shortness of breath.   Cardiovascular:  Negative for chest pain, palpitations and leg swelling.  Gastrointestinal:  Positive for abdominal pain. Negative for blood in stool and nausea.  Genitourinary:  Negative for dysuria and frequency.  Musculoskeletal:  Positive for myalgias. Negative for falls.  Skin:  Negative for rash.  Neurological:  Negative for dizziness, loss of consciousness and headaches.  Endo/Heme/Allergies:  Negative for environmental allergies.  Psychiatric/Behavioral:  Negative for depression. The patient is not nervous/anxious.       Objective:    Physical Exam Vitals and nursing note reviewed.  Constitutional:      Appearance: She  is well-developed.  Neurological:     General: No focal deficit present.     Mental Status: She is alert and oriented to person, place, and time.  Psychiatric:        Mood and Affect: Mood normal.        Behavior: Behavior normal.    There were no vitals taken for this visit. Wt Readings from Last 3 Encounters:  05/05/21 211 lb 3.2 oz (95.8 kg)  03/04/21 208 lb (94.3 kg)  02/18/21 208 lb 9.6 oz (94.6 kg)    Diabetic Foot Exam - Simple   No data filed  Lab Results  Component Value Date   WBC 6.2 03/04/2021   HGB 13.7 03/04/2021   HCT 40.4 03/04/2021   PLT 330.0 03/04/2021   GLUCOSE 92 03/04/2021   CHOL 292 (H) 10/15/2020   TRIG 181.0 (H) 10/15/2020   HDL 66.80 10/15/2020   LDLCALC 189 (H) 10/15/2020   ALT 17 03/04/2021   AST 13 03/04/2021   NA 139 03/04/2021   K 4.7 03/04/2021   CL 101 03/04/2021   CREATININE 1.10 03/04/2021   BUN 16 03/04/2021   CO2 30 03/04/2021   TSH 3.05 03/04/2021   MICROALBUR <0.7 12/11/2019    Lab Results  Component Value Date   TSH 3.05 03/04/2021   Lab Results  Component Value Date   WBC 6.2 03/04/2021   HGB 13.7 03/04/2021   HCT 40.4 03/04/2021   MCV 89.7 03/04/2021   PLT 330.0 03/04/2021   Lab Results  Component Value Date   NA 139 03/04/2021   K 4.7 03/04/2021   CO2 30 03/04/2021   GLUCOSE 92 03/04/2021   BUN 16 03/04/2021   CREATININE 1.10 03/04/2021   BILITOT 0.6 03/04/2021   ALKPHOS 92 03/04/2021   AST 13 03/04/2021   ALT 17 03/04/2021   PROT 6.5 03/04/2021   ALBUMIN 4.0 03/04/2021   CALCIUM 9.5 03/04/2021   ANIONGAP 11 02/01/2020   GFR 56.55 (L) 03/04/2021   Lab Results  Component Value Date   CHOL 292 (H) 10/15/2020   Lab Results  Component Value Date   HDL 66.80 10/15/2020   Lab Results  Component Value Date   LDLCALC 189 (H) 10/15/2020   Lab Results  Component Value Date   TRIG 181.0 (H) 10/15/2020   Lab Results  Component Value Date   CHOLHDL 4 10/15/2020   No results found for:  HGBA1C     Assessment & Plan:   Problem List Items Addressed This Visit   None Visit Diagnoses     Pharyngitis, unspecified etiology    -  Primary   Relevant Medications   azithromycin (ZITHROMAX Z-PAK) 250 MG tablet       I am having Jenna Arroyo start on azithromycin. I am also having her maintain her PREBIOTIC PRODUCT PO, ELDERBERRY PO, Vitamin D, Armour Thyroid, silver sulfADIAZINE, DULoxetine, hydrOXYzine, ALPRAZolam, Vitamin D (Ergocalciferol), EPINEPHrine, irbesartan-hydrochlorothiazide, and atenolol.  Meds ordered this encounter  Medications   azithromycin (ZITHROMAX Z-PAK) 250 MG tablet    Sig: As directed    Dispense:  6 each    Refill:  0    I discussed the assessment and treatment plan with the patient. The patient was provided an opportunity to ask questions and all were answered. The patient agreed with the plan and demonstrated an understanding of the instructions.   The patient was advised to call back or seek an in-person evaluation if the symptoms worsen or if the condition fails to improve as anticipated.   Donato Schultz, DO [facility] [phone] (phone) [phone] (fax)  [facility]

## 2021-06-19 ENCOUNTER — Ambulatory Visit (INDEPENDENT_AMBULATORY_CARE_PROVIDER_SITE_OTHER): Payer: Self-pay | Admitting: Psychiatry

## 2021-06-19 ENCOUNTER — Encounter: Payer: Self-pay | Admitting: Psychiatry

## 2021-06-19 DIAGNOSIS — Z638 Other specified problems related to primary support group: Secondary | ICD-10-CM

## 2021-06-19 DIAGNOSIS — F411 Generalized anxiety disorder: Secondary | ICD-10-CM

## 2021-06-19 DIAGNOSIS — F4001 Agoraphobia with panic disorder: Secondary | ICD-10-CM

## 2021-06-19 DIAGNOSIS — F900 Attention-deficit hyperactivity disorder, predominantly inattentive type: Secondary | ICD-10-CM

## 2021-06-19 MED ORDER — DULOXETINE HCL 20 MG PO CPEP
20.0000 mg | ORAL_CAPSULE | Freq: Every day | ORAL | 1 refills | Status: DC
Start: 2021-06-19 — End: 2021-12-29

## 2021-06-19 NOTE — Progress Notes (Signed)
Jenna Arroyo [id number] Aug 27, 1965 55 y.o.   Virtual Visit via Telephone Note  I connected with pt by telephone and verified that I am speaking with the correct person using two identifiers.   I discussed the limitations, risks, security and privacy concerns of performing an evaluation and management service by telephone and the availability of in person appointments. I also discussed with the patient that there may be a patient responsible charge related to this service. The patient expressed understanding and agreed to proceed.  I discussed the assessment and treatment plan with the patient. The patient was provided an opportunity to ask questions and all were answered. The patient agreed with the plan and demonstrated an understanding of the instructions.   The patient was advised to call back or seek an in-person evaluation if the symptoms worsen or if the condition fails to improve as anticipated.  I provided 30 minutes of non-face-to-face time during this encounter. The call started at 930 and ended at 1000. The patient was located at home and the provider was located office.   Subjective:   Patient ID:  Jenna Arroyo is a 55 y.o. (DOB [birth year]) female.  Chief Complaint:  Chief Complaint  Patient presents with   Follow-up   GAD (generalized anxiety disorder)   Depression    Anxiety Symptoms include nervous/anxious behavior. Patient reports no chest pain, confusion, decreased concentration, palpitations or suicidal ideas.    Jenna Arroyo presents to the office today for follow-up of anxiety and mood.  seen October 2020.  She had been having cognitive complaints as previously noted. Still recommend trial switch to Ativan 1 mg QID to see if cognition is better.  Her cognitive problems have frequently been in a contributor to her difficulty maintaining employment.  She is fearful about having Xanax withdrawal.  This was discussed in detail.  Also discussed in detail the potential  sedative effects of benzodiazepines and especially in the first week of the transition from 1 of the medications to another.  It is likely that she will be more successful with the transition if she is a little more sleepy than normal the first week by using half of each medication then if we abruptly switch medications.  That has a much lower success rate in her case because of her worry about the transition. She agrees to a trial.  Will do 1/2 of 0.5 mg tablet tablet of Xanax and 1/2 of 1 mg tablet Ativan QID for 1 week. Then DC alprazolam and take Ativan 1 mg 3 times daily or 4 times daily to control anxiety.  Call back in 2 to 3 weeks to give a report of how well she is doing with the new medication. DC hydroxyzine because of its potential to cause cognitive problems. Continue duloxetine 60 mg daily.  10/18/19 appt with the following noted and no further med changes: She had difficulty implementing the transition and then complained of insomnia with lorazepam and was switched back to alprazolam after a week.  Also CO pain and nausea.   Overall doing OK except son and D upset with her since December.  Disc this in detail.  Hasn't seen GD's since then.  Working at job she loves but very PT. Exercising and lost 18#  02/21/20 with the following noted: Hanging in there with stress.  Not allowed to see GD still.  Doesn't know why. No Covid.  OCC BP spikes. No major med changes. Satisfied with duloxetine and Xanax.  No  full panic.   Sleep waxes and wanes but lately good.  Working same job as in April and likes it. Plan: no med changes  08/21/2020 appointment with the following noted: Stopped duloxetine for a week early Feb over BP concerns from it.  Convinced BP affected by 20 mg duloxetine which she's been taking for a few months.  Felt 30 mg was too high and then it started causing problems.  When off the duloxetine for a week then BP went down. Times when doesn't have it and the most noticeable  thing is feeling tired and a little disconnected.  Longest off it was 2-4 weeks in last 6 mos. Has unresponsive PCP. Chronic tiredness.  Still dealing with chronic Lyme dz.  Plan: Stop duloxetine BC of BP concerns.  Monitor and record BP and share with PCP.  Stay off for a full month and if consistently more anxious then can add pure SSRI low dose Lexapro 5 mg for anxiety.  11/18/20 TC Pt had asked to increase Lexapro to 10 mg while mo in the hospital.  11/25/20 TC:  Pt contacted doctor on call this weekend concerning her issues with Lexapro 10 mg. Since the increase to 10 mg she has been unable to sleep, reports black circles under her eyes, gets to sleep about 1-2 am. She also reports on Saturday she had a anxiety attack, she felt it was medication related not just her anxiety. [name], NP advised her to stop Lexapro and nurse would contact her Monday.  She asks about trying just the Lexapro 5 mg, she reports it helped and it didn't effect her sleep like 10 mg.  Pt stated the lexapro made her have increased anxiety and she thinks its best if she goes back to a very low dose of Cymbalta.She stopped Cymbalta due to it spiking her blood pressure.She says her bp was fine at the 20 mg dose. MD: ok to stop Lexapro and return to duloxetine 20  12/11/20 appt noted: Under more stress and more anxious.  Stress about 3 mos.  Dealing with condo where she lives.  Not making enough money to get basic things she needs and asking for money from parents.  Wants father to fix things in the condo but he doesn't do anything.   Place had a leak in the past.    Taking duloxetine 20 and off Lexapro and on alprazolam 0.5 mg QID.   Worries over the condition in the world and thinks the world could end and she might not be able to get the Xanax and fears withdrawal.    She feels bible is predicting end of the world soon.  But no thoughts of death were noted Plan: no med changes per her request.  02/11/2021 appointment  with the following noted: Family sold condo she was in and so she moved back in with parents about 3 weeks ago.  Stressed.  Has been seeking disability for a couple of years.  Doesn't think she can work FT.  Other relationships are stressed.   Chronic difficulty with organization. Walking and losing weight but worries about BP so doesn't want to increase the duloxetine. Patient reports stable mood and denies depressed or irritable moods.  Patient has chronic difficulty with anxiety and stress. Denies appetite disturbance.  Patient reports that energy and motivation have been good.  Patient denies any difficulty with concentration.  Patient denies any suicidal ideation. No panic. Sleep is OK. Using Xanax mostly TID.  On duloxetine 20  mg daily and doesn't want to change it.. Sleep better in the day.    No questions concerns re: meds. Plan no med changes per her request  06/19/2021 appointment with the following noted: Working Therapist, nutritional retirement.  Job offers [employer] FT Conflict with sister over dependence on her parents.  Feels her sister is unreasonable.  Can't handle being under that control.  Was doing good until she started in.  Blocked her contact.  Sister blames her for parents not having enough money. Siblings forcing her to get a job. Satisfied with the meds. Upset at having to pay all her bills on her own now. Recognizes chronic difficulties managing herself well.  Past Psychiatric Medication Trials: Duloxetine 60, imipramine, desipramine, sertraline hyper, citalopram side effects, paroxetine chest pain,  Lexapro ? Less effective for anxiety Nuvigil edgy,  buspirone dizzy,  Xanax, Ambien anxiety, trazodone side effects, atenolol  Review of Systems:  Review of Systems  Constitutional:  Positive for fatigue.       Low exercise tolerance  Cardiovascular:  Negative for chest pain and palpitations.  Gastrointestinal:  Negative for vomiting.  Musculoskeletal:  Positive  for arthralgias and neck pain.  Neurological:  Positive for headaches. Negative for tremors and weakness.       Foggy headed.  Psychiatric/Behavioral:  Positive for sleep disturbance. Negative for agitation, behavioral problems, confusion, decreased concentration, dysphoric mood, hallucinations, self-injury and suicidal ideas. The patient is nervous/anxious. The patient is not hyperactive.    Medications: I have reviewed the patient's current medications.  Current Outpatient Medications  Medication Sig Dispense Refill   ALPRAZolam (XANAX) 0.5 MG tablet TAKE ONE TABLET BY MOUTH EVERY MORNING, TAKE ONE TABLET BY MOUTH AT NOON, TAKE ONE TABLET BY MOUTH EVERY EVENING AND TAKE ONE TABLET BY MOUTH AT BEDTIME (Patient taking differently: Take by mouth. TAKE ONE TABLET BY MOUTH EVERY MORNING, TAKE ONE TABLET BY MOUTH AT NOON, TAKE ONE TABLET BY MOUTH EVERY EVENING AND TAKE ONE TABLET BY MOUTH AT BEDTIME) 120 tablet 5   ARMOUR THYROID 15 MG tablet Take 3 tablets (45 mg total) by mouth daily. 90 tablet 3   atenolol (TENORMIN) 25 MG tablet Take 1 tablet by mouth twice daily 180 tablet 0   Cholecalciferol (VITAMIN D) 10 MCG/ML LIQD Take by mouth.     diphenhydrAMINE (BENADRYL) 50 MG tablet Take 50 mg by mouth at bedtime as needed for itching.     ELDERBERRY PO Take by mouth.     EPINEPHrine (EPIPEN 2-PAK) 0.3 mg/0.3 mL IJ SOAJ injection Inject 0.3 mg into the muscle as needed for anaphylaxis. 1 each 0   hydrOXYzine (VISTARIL) 25 MG capsule TAKE ONE CAPSULE BY MOUTH ONE TIME DAILY AT BEDTIME AS NEEDED (Patient taking differently: TAKE ONE CAPSULE BY MOUTH ONE TIME DAILY AT BEDTIME AS NEEDED  Patient states doesn't work for her.) 30 capsule 0   irbesartan-hydrochlorothiazide (AVALIDE) 150-12.5 MG tablet Take 2 tablets by mouth daily. 180 tablet 1   silver sulfADIAZINE (SILVADENE) 1 % cream Apply 1 application topically daily. 50 g 0   Vitamin D, Ergocalciferol, (DRISDOL) 1.25 MG (50000 UNIT) CAPS capsule Take  1 capsule (50,000 Units total) by mouth every 7 (seven) days. 12 capsule 0   DULoxetine (CYMBALTA) 20 MG capsule Take 1 capsule (20 mg total) by mouth daily. 90 capsule 1   PREBIOTIC PRODUCT PO Take by mouth. Mix in shake and drink once a day. (Patient not taking: Reported on 06/19/2021)     No current  facility-administered medications for this visit.    Medication Side Effects: None  Allergies:  Allergies  Allergen Reactions   Ceftriaxone Sodium In Dextrose Other (See Comments)    "heart races and cannot breathe"   Guaifenesin & Derivatives Other (See Comments)    "heart races, dizziness, light-headedness"   Meperidine Other (See Comments)    "heart races"   Other Rash   Penicillins Hives   Pseudoephedrine Other (See Comments)    "heart races and high blood pressure"   Rosuvastatin Calcium Nausea And Vomiting    Muscle aches    Simvastatin Other (See Comments)    muscle aching    Ciprofloxacin    Rocephin [Ceftriaxone] Itching    Past Medical History:  Diagnosis Date   Anxiety    High cholesterol    Hypertension    Lyme disease    Thyroid disease     Family History  Problem Relation Age of Onset   Hypertension Mother    High Cholesterol Mother    Heart disease Father    High Cholesterol Father    Hypertension Sister    High Cholesterol Sister    Hypertension Brother    High Cholesterol Brother    Colon cancer Neg Hx     Social History   Socioeconomic History   Marital status: Divorced    Spouse name: Not on file   Number of children: Not on file   Years of education: Not on file   Highest education level: Not on file  Occupational History   Not on file  Tobacco Use   Smoking status: Former    Types: Cigarettes    Quit date: 2006    Years since quitting: 16.9   Smokeless tobacco: Never   Tobacco comments:    socially   Building services engineer Use: Never used  Substance and Sexual Activity   Alcohol use: No   Drug use: No   Sexual activity: Not  Currently    Birth control/protection: Post-menopausal  Other Topics Concern   Not on file  Social History Narrative   Not on file   Social Determinants of Health   Financial Resource Strain: Not on file  Food Insecurity: Not on file  Transportation Needs: Not on file  Physical Activity: Not on file  Stress: Not on file  Social Connections: Not on file  Intimate Partner Violence: Not on file    Past Medical History, Surgical history, Social history, and Family history were reviewed and updated as appropriate.   Please see review of systems for further details on the patient's review from today.   Objective:   Physical Exam:  There were no vitals taken for this visit.  Physical Exam Constitutional:      General: She is not in acute distress. Musculoskeletal:        General: No deformity.  Neurological:     Mental Status: She is alert and oriented to person, place, and time.     Cranial Nerves: No dysarthria.     Coordination: Coordination normal.  Psychiatric:        Attention and Perception: Perception normal. She is inattentive. She does not perceive auditory or visual hallucinations.        Mood and Affect: Mood is anxious. Mood is not depressed. Affect is not labile, blunt, angry, tearful or inappropriate.        Speech: Speech normal.        Behavior: Behavior normal. Behavior is cooperative.  Thought Content: Thought content normal. Thought content is not paranoid or delusional. Thought content does not include homicidal or suicidal ideation. Thought content does not include suicidal plan.        Cognition and Memory: Cognition and memory normal.        Judgment: Judgment normal.     Comments: Insight fair.  Patient has chronically low stress tolerance. Complain of chronic forgetfulness     Lab Review:     Component Value Date/Time   NA 139 03/04/2021 1427   K 4.7 03/04/2021 1427   CL 101 03/04/2021 1427   CO2 30 03/04/2021 1427   GLUCOSE 92  03/04/2021 1427   BUN 16 03/04/2021 1427   CREATININE 1.10 03/04/2021 1427   CREATININE 0.85 04/05/2020 1052   CALCIUM 9.5 03/04/2021 1427   PROT 6.5 03/04/2021 1427   ALBUMIN 4.0 03/04/2021 1427   AST 13 03/04/2021 1427   ALT 17 03/04/2021 1427   ALKPHOS 92 03/04/2021 1427   BILITOT 0.6 03/04/2021 1427   GFRNONAA >60 02/01/2020 1513   GFRAA >60 02/01/2020 1513       Component Value Date/Time   WBC 6.2 03/04/2021 1427   RBC 4.50 03/04/2021 1427   HGB 13.7 03/04/2021 1427   HCT 40.4 03/04/2021 1427   PLT 330.0 03/04/2021 1427   MCV 89.7 03/04/2021 1427   MCH 30.3 04/05/2020 1052   MCHC 34.0 03/04/2021 1427   RDW 13.6 03/04/2021 1427   LYMPHSABS 1.3 03/04/2021 1427   MONOABS 0.4 03/04/2021 1427   EOSABS 0.1 03/04/2021 1427   BASOSABS 0.1 03/04/2021 1427    No results found for: POCLITH, LITHIUM   No results found for: PHENYTOIN, PHENOBARB, VALPROATE, CBMZ   .res Assessment: Plan:   Generalized anxiety disorder, manageable  Attention deficit hyperactivity disorder improved with duloxetine  History of panic attacks in remission with current meds  Chronic family conflict  Patient has been under our care for many years.  She has some chronic instability and anxiety and function but does generally get benefit from the medication.  She has not been able to reduce the dosage of alprazolam which she has taken for many years due to excessive anxiety.  Chronic problems and conflicts with parents and sister and brother and other people  Wants to stay on the Cymbalta 20 instead of the Lexapro.  Thought Lexapro started causing anxiety problems but has been more stressed lately.  Afraid of increasing duloxetine because of fear will affect her blood pressure.  She failed attempted transition from alprazolam to lorazepam in October 2020.  It appears she had some withdrawal symptoms and she is gone back to alprazolam.  It was discussed with her that we could do the transition more  gradually if she wanted to pursue that to see if she can get cognitive benefit with the switch.  She does not want to do that at this time. Continue Xanax 0.5 mg QID Disc her fears of withdrawal and med shortage.  Reassured her about this issue.  No med changes today.  We discussed the short-term risks associated with benzodiazepines including sedation and increased fall risk among others.  Discussed long-term side effect risk including dependence, potential withdrawal symptoms, and the potential eventual dose-related risk of dementia.  But recent studies from 2020 dispute this association between benzodiazepines and dementia risk. Newer studies in 2020 do not support an association with dementia.   Has started counseling at [facility] Educated about her father's recent dx of dementia and  what it means to have the dx. Supportive therapy dealing with family conflict.  FU 3-4 mos  Meredith Staggers, MD, DFAPA   Please see After Visit Summary for patient specific instructions.  Future Appointments  Date Time Provider Department Center  09/02/2021  2:20 PM Zola Button, Myrene Buddy R, DO [facility]     No orders of the defined types were placed in this encounter.     -------------------------------

## 2021-07-01 ENCOUNTER — Telehealth: Payer: Self-pay

## 2021-07-01 NOTE — Telephone Encounter (Signed)
Nurse Assessment Nurse: Kizzie Bane, RN, [name] Date/Time Jenna Arroyo Time): 06/27/2021 2:07:19 PM Confirm and document reason for call. If symptomatic, describe symptoms. ---Caller states she took an extra does of her thyroid medication this morning. She took the extra dose about 15 minutes ago. Does the patient have any new or worsening symptoms? ---Yes Will a triage be completed? ---Yes Related visit to physician within the last 2 weeks? ---No Does the PT have any chronic conditions? (i.e. diabetes, asthma, this includes High risk factors for pregnancy, etc.) ---Yes List chronic conditions. ---hypothyroid, lyme disease, epstein barr, depression, anxiety Is this a behavioral health or substance abuse call? ---No Guidelines Guideline Title Affirmed Question Affirmed Notes Nurse Date/Time (Eastern Time) Poisoning [1] DOUBLE DOSE (an extra dose or lesser amount) of prescription drug AND [2] any symptoms (e.g., dizziness, nausea, pain, sleepiness) Kizzie Bane, RN, [name] 06/27/2021 2:09:17 PM PLEASE NOTE: All timestamps contained within this report are represented as [place] Standard Time. CONFIDENTIALTY NOTICE: This fax transmission is intended only for the addressee. It contains information that is legally privileged, confidential or otherwise protected from use or disclosure. If you are not the intended recipient, you are strictly prohibited from reviewing, disclosing, copying using or disseminating any of this information or taking any action in reliance on or regarding this information. If you have received this fax in error, please notify us immediately by telephone so that we can arrange for its return to [place]. Phone: [phone], Toll-Free: [phone], Fax: [phone] Page: 2 of 2 Call Id: [id number] Disp. Time Jenna Arroyo Time) Disposition Final User 06/27/2021 2:06:11 PM Send to Urgent Franchot Gallo 06/27/2021 2:11:59 PM Call Poison Center Now Yes Kizzie Bane, RN, [name]  Disagree/Comply Comply Caller Understands Yes PreDisposition Did not know what to do Care Advice Given Per Guideline CALL POISON CENTER NOW: * You need to call the [facility] now. [facility] advice is a free service. [place] [facility] NUMBER: [facility]: [phone] CARE ADVICE given per Poisoning (Adult) guideline

## 2021-07-01 NOTE — Telephone Encounter (Signed)
Pt called. LVM to call back to get more info

## 2021-07-08 ENCOUNTER — Telehealth: Payer: Self-pay

## 2021-07-08 NOTE — Telephone Encounter (Signed)
Nurse Assessment Nurse: Verita Schneiders, RN, April Date/Time Eilene Ghazi Time): 07/05/2021 4:34:07 PM Confirm and document reason for call. If symptomatic, describe symptoms. ---Caller states her blood pressure is going down. Got up to 135/87 last night. This morning it was 99/79 (was very tired when woke up). Now it is 106/79. Feeling lightheaded-- when got up and walked to house from car. Does the patient have any new or worsening symptoms? ---Yes Will a triage be completed? ---Yes Related visit to physician within the last 2 weeks? ---No Does the PT have any chronic conditions? (i.e. diabetes, asthma, this includes High risk factors for pregnancy, etc.) ---Yes List chronic conditions. ---Hashimoto's, chronic lung disease, tachycardia Is this a behavioral health or substance abuse call? ---No Guidelines Guideline Title Affirmed Question Affirmed Notes Nurse Date/Time (Eastern Time) Dizziness - Lightheadedness Taking a medicine that could cause dizziness (e.g., blood pressure medications, diuretics) Beams, RN, April 07/05/2021 4:40:09 PM Disp. Time Eilene Ghazi Time) Disposition Final User PLEASE NOTE: All timestamps contained within this report are represented as [place] Standard Time. CONFIDENTIALTY NOTICE: This fax transmission is intended only for the addressee. It contains information that is legally privileged, confidential or otherwise protected from use or disclosure. If you are not the intended recipient, you are strictly prohibited from reviewing, disclosing, copying using or disseminating any of this information or taking any action in reliance on or regarding this information. If you have received this fax in error, please notify us immediately by telephone so that we can arrange for its return to [place]. Phone: [phone], Toll-Free: [phone], Fax: [phone] Page: 2 of 2 Call Id: [id number] 07/05/2021 4:48:06 PM See PCP within 24 Hours Yes Beams, RN, April Caller  Disagree/Comply Disagree Caller Understands Yes PreDisposition Call Doctor Care Advice Given Per Guideline SEE PCP WITHIN 24 HOURS: * IF OFFICE WILL BE CLOSED: You need to be seen within the next 24 hours. A clinic or an urgent care center is often a good source of care if your doctor's office is closed or you can't get an appointment. CALL BACK IF: * You become worse CARE ADVICE given per Dizziness (Adult) guideline. DRINK FLUIDS: * This will improve hydration and blood glucose. * Drink several glasses of fruit juice, other clear fluids or water. * Passes out (faints) Comments User: April, Beams, RN Date/Time Eilene Ghazi Time): 07/05/2021 4:41:45 PM *potassium? Taking dieretic now. User: April, Beams, RN Date/Time Eilene Ghazi Time): 07/05/2021 4:50:05 PM Refusing to be seen. States she can not afford a visit to [facility]. States she will continue drinking plenty of fluids/ eating well. Will call office to follow up. Does understand recommendations though. Referrals GO TO FACILITY REFUSED REFERRED TO PCP OFFICE

## 2021-07-09 NOTE — Telephone Encounter (Signed)
Lvm for patient to call back and schedule appt  

## 2021-07-14 ENCOUNTER — Other Ambulatory Visit: Payer: Self-pay | Admitting: Family Medicine

## 2021-07-14 DIAGNOSIS — S161XXA Strain of muscle, fascia and tendon at neck level, initial encounter: Secondary | ICD-10-CM

## 2021-07-14 NOTE — Telephone Encounter (Signed)
Patient has an appointment on 1/12 with [name]. Pt would like to know if she could come in for just lab work instead. Please advice.

## 2021-07-14 NOTE — Telephone Encounter (Signed)
Pt seeing Edward on 01/12 for blood pressure and is wanting to labs. Should she wait until her appointment to discuss her concerns

## 2021-07-15 NOTE — Telephone Encounter (Signed)
Pt being seen on 07/17/21 with Jenna Arroyo

## 2021-07-17 ENCOUNTER — Ambulatory Visit (INDEPENDENT_AMBULATORY_CARE_PROVIDER_SITE_OTHER): Payer: Self-pay | Admitting: Medical

## 2021-07-17 VITALS — BP 117/66 | HR 74 | Temp 98.0°F | Resp 18 | Ht 66.0 in | Wt 210.0 lb

## 2021-07-17 DIAGNOSIS — F411 Generalized anxiety disorder: Secondary | ICD-10-CM

## 2021-07-17 DIAGNOSIS — I10 Essential (primary) hypertension: Secondary | ICD-10-CM

## 2021-07-17 DIAGNOSIS — F3289 Other specified depressive episodes: Secondary | ICD-10-CM

## 2021-07-17 LAB — COMPREHENSIVE METABOLIC PANEL
ALT: 16 U/L (ref 0–35)
AST: 13 U/L (ref 0–37)
Albumin: 4.2 g/dL (ref 3.5–5.2)
Alkaline Phosphatase: 91 U/L (ref 39–117)
BUN: 19 mg/dL (ref 6–23)
CO2: 35 mEq/L — ABNORMAL HIGH (ref 19–32)
Calcium: 9.5 mg/dL (ref 8.4–10.5)
Chloride: 97 mEq/L (ref 96–112)
Creatinine, Ser: 0.95 mg/dL (ref 0.40–1.20)
GFR: 67.25 mL/min (ref >60.00)
Glucose, Bld: 80 mg/dL (ref 70–99)
Potassium: 4.6 mEq/L (ref 3.5–5.1)
Sodium: 138 mEq/L (ref 135–145)
Total Bilirubin: 0.4 mg/dL (ref 0.2–1.2)
Total Protein: 6.6 g/dL (ref 6.0–8.3)

## 2021-07-17 LAB — CBC WITH DIFFERENTIAL/PLATELET
Basophils Absolute: 0.1 10*3/uL (ref 0.0–0.1)
Basophils Relative: 0.8 % (ref 0.0–3.0)
Eosinophils Absolute: 0.2 10*3/uL (ref 0.0–0.7)
Eosinophils Relative: 2.1 % (ref 0.0–5.0)
HCT: 39.6 % (ref 36.0–46.0)
Hemoglobin: 13.2 g/dL (ref 12.0–15.0)
Lymphocytes Relative: 21.9 % (ref 12.0–46.0)
Lymphs Abs: 1.7 10*3/uL (ref 0.7–4.0)
MCHC: 33.3 g/dL (ref 30.0–36.0)
MCV: 90.9 fl (ref 78.0–100.0)
Monocytes Absolute: 0.4 10*3/uL (ref 0.1–1.0)
Monocytes Relative: 5.8 % (ref 3.0–12.0)
Neutro Abs: 5.3 10*3/uL (ref 1.4–7.7)
Neutrophils Relative %: 69.4 % (ref 43.0–77.0)
Platelets: 328 10*3/uL (ref 150.0–400.0)
RBC: 4.35 Mil/uL (ref 3.87–5.11)
RDW: 13.2 % (ref 11.5–15.5)
WBC: 7.6 10*3/uL (ref 4.0–10.5)

## 2021-07-17 NOTE — Progress Notes (Signed)
Subjective:    Patient ID: Jenna Arroyo, female    DOB: 10/27/1965, 56 y.o.   MRN: [id number]  HPI  Pt in for bp check and she is stressed with new job. Pt states that her blood pressure has been varying. Pt states she had bp 145/94 on first week of work. She is restricting her sodium levels. She is on atenolol 25 mg twice daily. Pt also is on avalide. 2 tabs daily.    Pt states one event her bp was 90/60. She states came home from work and checked her blood pressure. Pt thinks may need to get new blood pressure. She will occasionally get error.   Pt working 2 weeks at [employer]. Pt states has some challenges with computers. Difficulty learning system.   Pt has some anxiety/stress with new job. Hx of anxiety in the past on review. Pt thinks hydroxyzine in the past did not help much with anxiety. She thinks having siginficant stress and anxiety that may need med. Dr. Jennelle Humanottle has her on xanax up to 4 times a day.  But also notes she has someone actually training her with system so feels like stress is less.  On review pt is seeing Dr. Jennelle Humanottle. Some depression and on cymbalata. Depression stable.    Review of Systems  Constitutional:  Negative for chills, fatigue and fever.  Respiratory:  Negative for cough, chest tightness and wheezing.   Cardiovascular:  Negative for chest pain and palpitations.  Gastrointestinal:  Negative for abdominal pain, constipation, diarrhea and nausea.  Genitourinary:  Negative for dysuria, flank pain and frequency.  Musculoskeletal:  Negative for back pain.  Neurological:  Negative for seizures, facial asymmetry, weakness and light-headedness.  Hematological:  Negative for adenopathy.  Psychiatric/Behavioral:  Positive for dysphoric mood. Negative for behavioral problems and suicidal ideas. The patient is nervous/anxious.        Stable with meds.    Past Medical History:  Diagnosis Date   Anxiety    High cholesterol    Hypertension    Lyme  disease    Thyroid disease      Social History   Socioeconomic History   Marital status: Divorced    Spouse name: Not on file   Number of children: Not on file   Years of education: Not on file   Highest education level: Not on file  Occupational History   Not on file  Tobacco Use   Smoking status: Former    Types: Cigarettes    Quit date: 2006    Years since quitting: 17.0   Smokeless tobacco: Never   Tobacco comments:    socially   Building services engineerVaping Use   Vaping Use: Never used  Substance and Sexual Activity   Alcohol use: No   Drug use: No   Sexual activity: Not Currently    Birth control/protection: Post-menopausal  Other Topics Concern   Not on file  Social History Narrative   Not on file   Social Determinants of Health   Financial Resource Strain: Not on file  Food Insecurity: Not on file  Transportation Needs: Not on file  Physical Activity: Not on file  Stress: Not on file  Social Connections: Not on file  Intimate Partner Violence: Not on file    Past Surgical History:  Procedure Laterality Date   ABDOMINAL EXPLORATION SURGERY     APPENDECTOMY     MASTOIDECTOMY     TONSILECTOMY/ADENOIDECTOMY WITH MYRINGOTOMY      Family History  Problem Relation Age of Onset   Hypertension Mother    High Cholesterol Mother    Heart disease Father    High Cholesterol Father    Hypertension Sister    High Cholesterol Sister    Hypertension Brother    High Cholesterol Brother    Colon cancer Neg Hx     Allergies  Allergen Reactions   Ceftriaxone Sodium In Dextrose Other (See Comments)    "heart races and cannot breathe"   Guaifenesin & Derivatives Other (See Comments)    "heart races, dizziness, light-headedness"   Meperidine Other (See Comments)    "heart races"   Other Rash   Penicillins Hives   Pseudoephedrine Other (See Comments)    "heart races and high blood pressure"   Rosuvastatin Calcium Nausea And Vomiting    Muscle aches    Simvastatin Other (See  Comments)    muscle aching    Ciprofloxacin    Rocephin [Ceftriaxone] Itching    Current Outpatient Medications on File Prior to Visit  Medication Sig Dispense Refill   ALPRAZolam (XANAX) 0.5 MG tablet TAKE ONE TABLET BY MOUTH EVERY MORNING, TAKE ONE TABLET BY MOUTH AT NOON, TAKE ONE TABLET BY MOUTH EVERY EVENING AND TAKE ONE TABLET BY MOUTH AT BEDTIME (Patient taking differently: Take by mouth. TAKE ONE TABLET BY MOUTH EVERY MORNING, TAKE ONE TABLET BY MOUTH AT NOON, TAKE ONE TABLET BY MOUTH EVERY EVENING AND TAKE ONE TABLET BY MOUTH AT BEDTIME) 120 tablet 5   ARMOUR THYROID 15 MG tablet Take 3 tablets (45 mg total) by mouth daily. 90 tablet 3   atenolol (TENORMIN) 25 MG tablet Take 1 tablet by mouth twice daily 180 tablet 0   Cholecalciferol (VITAMIN D) 10 MCG/ML LIQD Take by mouth.     diphenhydrAMINE (BENADRYL) 50 MG tablet Take 50 mg by mouth at bedtime as needed for itching.     DULoxetine (CYMBALTA) 20 MG capsule Take 1 capsule (20 mg total) by mouth daily. 90 capsule 1   ELDERBERRY PO Take by mouth.     EPINEPHrine (EPIPEN 2-PAK) 0.3 mg/0.3 mL IJ SOAJ injection Inject 0.3 mg into the muscle as needed for anaphylaxis. 1 each 0   hydrOXYzine (VISTARIL) 25 MG capsule TAKE ONE CAPSULE BY MOUTH ONE TIME DAILY AT BEDTIME AS NEEDED (Patient taking differently: TAKE ONE CAPSULE BY MOUTH ONE TIME DAILY AT BEDTIME AS NEEDED  Patient states doesn't work for her.) 30 capsule 0   irbesartan-hydrochlorothiazide (AVALIDE) 150-12.5 MG tablet Take 2 tablets by mouth daily. 180 tablet 1   PREBIOTIC PRODUCT PO Take by mouth. Mix in shake and drink once a day. (Patient not taking: Reported on 06/19/2021)     silver sulfADIAZINE (SILVADENE) 1 % cream Apply 1 application topically daily. 50 g 0   Vitamin D, Ergocalciferol, (DRISDOL) 1.25 MG (50000 UNIT) CAPS capsule Take 1 capsule (50,000 Units total) by mouth every 7 (seven) days. 12 capsule 0   No current facility-administered medications on file prior  to visit.    BP 117/66    Pulse 74    Temp 98 F (36.7 C)    Resp 18    Ht 5\' 6"  (1.676 m)    Wt 210 lb (95.3 kg)    SpO2 100%    BMI 33.89 kg/m       Objective:   Physical Exam   General Mental Status- Alert. General Appearance- Not in acute distress.   Skin General: Color- Normal Color. Moisture- Normal Moisture.  Chest  and Lung Exam Auscultation: Breath Sounds:-Normal.  Cardiovascular Auscultation:Rythm- Regular. Murmurs & Other Heart Sounds:Auscultation of the heart reveals- No Murmurs.   Neurologic Cranial Nerve exam:- CN III-XII intact(No nystagmus), symmetric smile. Strength:- 5/5 equal and symmetric strength both upper and lower extremities.      Assessment & Plan:   Patient Instructions  Htn- varying levels up to 145/94. But rare very low reading. Today 2 reading very tightly controlled bp. Want you to check bp daily and update me on readings next Tuesday or Wednesday. Continue avalide and atenolol same presently.  Will get cbc and cmp today.   For anxiety and depression continue cymbalta and xanax. Hopefully your new job won't be too stressful. Glad to hear they are now  training you on computer.   Follow up date to be determined after lab and bp update review.    Time spent with patient today was  30 minutes which consisted of chart review, discussing diagnosis, work up, treatment, discussing new job stress and documentation.

## 2021-07-17 NOTE — Patient Instructions (Addendum)
Htn- varying levels up to 145/94. But rare very low reading. Today 2 reading very tightly controlled bp. Want you to check bp daily and update me on readings next Tuesday or Wednesday. Continue avalide and atenolol same presently.  Will get cbc and cmp today.   For anxiety and depression continue cymbalta and xanax. Hopefully your new job won't be too stressful. Glad to hear they are now  training you on computer.   Follow up date to be determined after lab and bp update review.

## 2021-08-09 ENCOUNTER — Other Ambulatory Visit: Payer: Self-pay | Admitting: Family Medicine

## 2021-08-09 DIAGNOSIS — E559 Vitamin D deficiency, unspecified: Secondary | ICD-10-CM

## 2021-08-14 ENCOUNTER — Telehealth: Payer: Self-pay | Admitting: Family Medicine

## 2021-08-14 ENCOUNTER — Other Ambulatory Visit: Payer: Self-pay

## 2021-08-14 MED ORDER — ARMOUR THYROID 15 MG PO TABS: 45.0000 mg | ORAL_TABLET | Freq: Every day | ORAL | 3 refills | Status: DC

## 2021-08-14 NOTE — Telephone Encounter (Signed)
Medication:  ARMOUR THYROID 15 MG tablet [[id number]]     Has the patient contacted their pharmacy? No. (If no, request that the patient contact the pharmacy for the refill.) (If yes, when and what did the pharmacy advise?)     Preferred Pharmacy (with phone number or street name):  [id number] - [place], [place] - [address]  [address], [place] [address]  Phone:  [phone]  Fax:  [phone]    Agent: Please be advised that RX refills may take up to 3 business days. We ask that you follow-up with your pharmacy.

## 2021-08-14 NOTE — Telephone Encounter (Signed)
Rx sent 

## 2021-08-14 NOTE — Telephone Encounter (Signed)
LVM that Rx was sent in

## 2021-09-01 ENCOUNTER — Telehealth: Payer: Self-pay | Admitting: Family Medicine

## 2021-09-01 ENCOUNTER — Other Ambulatory Visit: Payer: Self-pay | Admitting: Family Medicine

## 2021-09-01 ENCOUNTER — Telehealth: Payer: Self-pay | Admitting: Psychiatry

## 2021-09-01 MED ORDER — TIZANIDINE HCL 2 MG PO CAPS: 2.0000 mg | ORAL_CAPSULE | Freq: Three times a day (TID) | ORAL | 1 refills | Status: DC

## 2021-09-01 NOTE — Telephone Encounter (Signed)
Pt LVM reporting she lost her mom Melvern Sample this  AM @ 3:30. She needs her hydrochorothiazide refilled to help with what she's dealing with. Contact # [phone].

## 2021-09-01 NOTE — Telephone Encounter (Signed)
Tizanidine not currently on med list. Please advise

## 2021-09-01 NOTE — Telephone Encounter (Signed)
Called patient to confirm appt for tomorrow, 2.27.23 Patient had to cancel CPE appt due to her mother passing away She said she would call back when she knows she has some time off  Patient states she is trying not to take xanax or anything like that in the time being as her psychiatrist is prescribing her to do   Patient states she had tizanidine (ZANAFLEX) 2 MG capsule [[id number]]  DISCONTINUED in the past and was wanting to know if Lowne would scribe that for her

## 2021-09-02 ENCOUNTER — Other Ambulatory Visit: Payer: Self-pay | Admitting: Psychiatry

## 2021-09-02 ENCOUNTER — Encounter: Payer: Self-pay | Admitting: Family Medicine

## 2021-09-02 DIAGNOSIS — F419 Anxiety disorder, unspecified: Secondary | ICD-10-CM

## 2021-09-02 MED ORDER — HYDROXYZINE PAMOATE 25 MG PO CAPS: 25.0000 mg | ORAL_CAPSULE | Freq: Three times a day (TID) | ORAL | 1 refills | Status: DC | PRN

## 2021-09-02 NOTE — Telephone Encounter (Signed)
Sent!

## 2021-09-02 NOTE — Telephone Encounter (Signed)
Pt was referring to her hydroxyzine.She said you prescribe it to her periodically and she could really use it right now.If you agree she [facility]

## 2021-09-05 ENCOUNTER — Telehealth: Payer: Self-pay | Admitting: Family

## 2021-09-05 ENCOUNTER — Encounter: Payer: Self-pay | Admitting: Family

## 2021-09-05 ENCOUNTER — Telehealth (INDEPENDENT_AMBULATORY_CARE_PROVIDER_SITE_OTHER): Payer: Self-pay | Admitting: Family

## 2021-09-05 VITALS — BP 130/87 | Ht 66.0 in | Wt 210.0 lb

## 2021-09-05 DIAGNOSIS — L309 Dermatitis, unspecified: Secondary | ICD-10-CM

## 2021-09-05 NOTE — Progress Notes (Signed)
?Jenna Arroyo is a 56 y.o. female with the following history as recorded in EpicCare:  ?Patient Active Problem List  ? Diagnosis Date Noted  ? Cervical strain 02/18/2021  ? Dyspepsia 02/18/2021  ? Urinary frequency 12/06/2020  ? Hyponatremia 09/23/2020  ? Anemia 06/14/2020  ? Nausea 04/05/2020  ? History of panic attacks 01/23/2019  ? GAD (generalized anxiety disorder) 04/22/2018  ? ADD (attention deficit disorder) 04/22/2018  ? Pharyngitis 12/11/2016  ? Axillary lymphadenopathy 12/11/2016  ? Lyme disease 12/11/2016  ? Dyslipidemia 06/18/2016  ? Essential hypertension 06/18/2016  ? Hypothyroidism 06/18/2016  ?  ?Current Outpatient Medications  ?Medication Sig Dispense Refill  ? ALPRAZolam (XANAX) 0.5 MG tablet TAKE ONE TABLET BY MOUTH EVERY MORNING, TAKE ONE TABLET BY MOUTH AT NOON, TAKE ONE TABLET BY MOUTH EVERY EVENING AND TAKE ONE TABLET BY MOUTH AT BEDTIME (Patient taking differently: Take by mouth. TAKE ONE TABLET BY MOUTH EVERY MORNING, TAKE ONE TABLET BY MOUTH AT NOON, TAKE ONE TABLET BY MOUTH EVERY EVENING AND TAKE ONE TABLET BY MOUTH AT BEDTIME) 120 tablet 5  ? ARMOUR THYROID 15 MG tablet Take 3 tablets (45 mg total) by mouth daily. 90 tablet 3  ? atenolol (TENORMIN) 25 MG tablet Take 1 tablet by mouth twice daily 180 tablet 0  ? Cholecalciferol (VITAMIN D) 10 MCG/ML LIQD Take by mouth.    ? diphenhydrAMINE (BENADRYL) 50 MG tablet Take 50 mg by mouth at bedtime as needed for itching.    ? DULoxetine (CYMBALTA) 20 MG capsule Take 1 capsule (20 mg total) by mouth daily. 90 capsule 1  ? hydrOXYzine (VISTARIL) 25 MG capsule Take 1 capsule (25 mg total) by mouth every 8 (eight) hours as needed. 60 capsule 1  ? irbesartan-hydrochlorothiazide (AVALIDE) 150-12.5 MG tablet Take 2 tablets by mouth daily. 180 tablet 1  ? Vitamin D, Ergocalciferol, (DRISDOL) 1.25 MG (50000 UNIT) CAPS capsule Take 1 capsule by mouth once a week 12 capsule 0  ? ELDERBERRY PO Take by mouth. (Patient not taking: Reported on 09/05/2021)    ?  EPINEPHrine (EPIPEN 2-PAK) 0.3 mg/0.3 mL IJ SOAJ injection Inject 0.3 mg into the muscle as needed for anaphylaxis. (Patient not taking: Reported on 09/05/2021) 1 each 0  ? PREBIOTIC PRODUCT PO Take by mouth. Mix in shake and drink once a day. (Patient not taking: Reported on 06/19/2021)    ? silver sulfADIAZINE (SILVADENE) 1 % cream Apply 1 application topically daily. (Patient not taking: Reported on 09/05/2021) 50 g 0  ? tizanidine (ZANAFLEX) 2 MG capsule Take 1 capsule (2 mg total) by mouth 3 (three) times daily. (Patient not taking: Reported on 09/05/2021) 30 capsule 1  ? ?No current facility-administered medications for this visit.  ?  ?Allergies: Ceftriaxone sodium in dextrose, Guaifenesin & derivatives, Meperidine, Other, Penicillins, Pseudoephedrine, Rosuvastatin calcium, Simvastatin, Ciprofloxacin, and Rocephin [ceftriaxone]  ?Past Medical History:  ?Diagnosis Date  ? Anxiety   ? High cholesterol   ? Hypertension   ? Lyme disease   ? Thyroid disease   ?  ?Past Surgical History:  ?Procedure Laterality Date  ? ABDOMINAL EXPLORATION SURGERY    ? APPENDECTOMY    ? MASTOIDECTOMY    ? TONSILECTOMY/ADENOIDECTOMY WITH MYRINGOTOMY    ?  ?Family History  ?Problem Relation Age of Onset  ? Hypertension Mother   ? High Cholesterol Mother   ? Heart disease Father   ? High Cholesterol Father   ? Hypertension Sister   ? High Cholesterol Sister   ? Hypertension Brother   ?  High Cholesterol Brother   ? Colon cancer Neg Hx   ?  ?Social History  ? ?Tobacco Use  ? Smoking status: Former  ?  Types: Cigarettes  ?  Quit date: 2006  ?  Years since quitting: 17.1  ? Smokeless tobacco: Never  ? Tobacco comments:  ?  socially   ?Substance Use Topics  ? Alcohol use: No  ?  ?Subjective:  ? ? ?I connected with Jenna Arroyo on 09/05/21 at  1:20 PM EST by a video enabled telemedicine application and verified that I am speaking with the correct person using two identifiers. ?  ?I discussed the limitations of evaluation and management by  telemedicine and the availability of in person appointments. The patient expressed understanding and agreed to proceed. ?Provider in office/ patient is at home; provider and patient are only 2 people on video call.  ? ?Patient had eye brow waxing done yesterday and unfortunately has had an allergic reaction to the cooling gel applied as part of the process; concerned about red irritation/ "raised bumps" on forehead;  ? ? ? ?Objective:  ?Vitals:  ? 09/05/21 1312  ?BP: 130/87  ?Weight: 210 lb (95.3 kg)  ?Height: 5\' 6"  (1.676 m)  ?  ?General: Well developed, well nourished, in no acute distress  ?Skin : Warm and dry.  ?Head: Normocephalic and atraumatic  ?Lungs: Respirations unlabored;  ?Neurologic: Alert and oriented; speech intact; face symmetrical;  ? ?Assessment:  ?1. Dermatitis   ?  ?Plan:  ?Suspect allergic reaction to topical lotion applied with waxing; will have patient try OTC hydrocortisone cream; follow up worse,no better.  ? ?No follow-ups on file.  ?No orders of the defined types were placed in this encounter. ?  ?Requested Prescriptions  ? ? No prescriptions requested or ordered in this encounter  ?  ? ?

## 2021-09-12 ENCOUNTER — Other Ambulatory Visit: Payer: Self-pay | Admitting: Psychiatry

## 2021-09-12 DIAGNOSIS — F411 Generalized anxiety disorder: Secondary | ICD-10-CM

## 2021-09-12 DIAGNOSIS — Z8659 Personal history of other mental and behavioral disorders: Secondary | ICD-10-CM

## 2021-09-15 NOTE — Telephone Encounter (Signed)
Pt LVM 3/10 after 5 pm requesting refill on alprazolam @ [facility] [address]   ?

## 2021-09-15 NOTE — Telephone Encounter (Signed)
Rx pended earlier today, awaiting Dr. Alwyn Ren signature.  ?

## 2021-10-27 ENCOUNTER — Telehealth: Payer: Self-pay

## 2021-11-13 NOTE — Telephone Encounter (Signed)
No additional notes

## 2021-12-01 ENCOUNTER — Other Ambulatory Visit: Payer: Self-pay | Admitting: Psychiatry

## 2021-12-01 ENCOUNTER — Other Ambulatory Visit: Payer: Self-pay | Admitting: Family Medicine

## 2021-12-01 DIAGNOSIS — I10 Essential (primary) hypertension: Secondary | ICD-10-CM

## 2021-12-01 NOTE — Telephone Encounter (Signed)
Last Rx 11/22, last filled 5/24. Is she taking as prescribed.

## 2021-12-12 ENCOUNTER — Other Ambulatory Visit: Payer: Self-pay | Admitting: Family Medicine

## 2021-12-12 DIAGNOSIS — I10 Essential (primary) hypertension: Secondary | ICD-10-CM

## 2021-12-18 ENCOUNTER — Other Ambulatory Visit: Payer: Self-pay | Admitting: Family Medicine

## 2021-12-18 DIAGNOSIS — E559 Vitamin D deficiency, unspecified: Secondary | ICD-10-CM

## 2021-12-19 NOTE — Telephone Encounter (Signed)
Would you like Pt to continue Ergocalciferol?  

## 2021-12-29 ENCOUNTER — Encounter: Payer: Self-pay | Admitting: Psychiatry

## 2021-12-29 ENCOUNTER — Ambulatory Visit (INDEPENDENT_AMBULATORY_CARE_PROVIDER_SITE_OTHER): Payer: No Typology Code available for payment source | Admitting: Psychiatry

## 2021-12-29 DIAGNOSIS — F411 Generalized anxiety disorder: Secondary | ICD-10-CM

## 2021-12-29 DIAGNOSIS — Z638 Other specified problems related to primary support group: Secondary | ICD-10-CM | POA: Diagnosis not present

## 2021-12-29 DIAGNOSIS — F900 Attention-deficit hyperactivity disorder, predominantly inattentive type: Secondary | ICD-10-CM | POA: Diagnosis not present

## 2021-12-29 DIAGNOSIS — Z8659 Personal history of other mental and behavioral disorders: Secondary | ICD-10-CM

## 2021-12-29 DIAGNOSIS — I10 Essential (primary) hypertension: Secondary | ICD-10-CM

## 2021-12-29 DIAGNOSIS — F4001 Agoraphobia with panic disorder: Secondary | ICD-10-CM | POA: Diagnosis not present

## 2021-12-29 DIAGNOSIS — F419 Anxiety disorder, unspecified: Secondary | ICD-10-CM

## 2021-12-29 MED ORDER — HYDROXYZINE PAMOATE 25 MG PO CAPS: 25.0000 mg | ORAL_CAPSULE | Freq: Three times a day (TID) | ORAL | 1 refills | Status: DC | PRN

## 2021-12-29 MED ORDER — ALPRAZOLAM 0.5 MG PO TABS: ORAL_TABLET | ORAL | 5 refills | Status: DC

## 2021-12-29 MED ORDER — DULOXETINE HCL 20 MG PO CPEP: 20.0000 mg | ORAL_CAPSULE | Freq: Every day | ORAL | 1 refills | Status: DC

## 2022-02-26 ENCOUNTER — Other Ambulatory Visit: Payer: Self-pay | Admitting: Family Medicine

## 2022-03-18 ENCOUNTER — Telehealth: Payer: Self-pay

## 2022-03-18 NOTE — Telephone Encounter (Signed)
Nurse Assessment Nurse: Elesa Hacker, RN, Nash Dimmer Date/Time Jenna Arroyo Time): 03/18/2022 10:13:11 AM Confirm and document reason for call. If symptomatic, describe symptoms. ---Caller states her blood pressure has been up and down. This morning it was 105/73 and it's usually in the 120's/80's. Caller states she only took blood pressure pill yesterday. Last blood pressure 10 minutes ago 105/76. Recheck is 113/75, HR 87. Does the patient have any new or worsening symptoms? ---Yes Will a triage be completed? ---Yes Related visit to physician within the last 2 weeks? ---No Does the PT have any chronic conditions? (i.e. diabetes, asthma, this includes High risk factors for pregnancy, etc.) ---Yes List chronic conditions. ---HTN [place] Is this a behavioral health or substance abuse call? ---No Guidelines Guideline Title Affirmed Question Affirmed Notes Nurse Date/Time (Eastern Time) Blood Pressure - Low [1] Systolic BP 90-110 AND [2] taking blood pressure medications AND [3] NOT dizzy, lightheaded or weak Deaton, RN, Nash Dimmer 03/18/2022 10:15:18 AM PLEASE NOTE: All timestamps contained within this report are represented as [place] Standard Time. CONFIDENTIALTY NOTICE: This fax transmission is intended only for the addressee. It contains information that is legally privileged, confidential or otherwise protected from use or disclosure. If you are not the intended recipient, you are strictly prohibited from reviewing, disclosing, copying using or disseminating any of this information or taking any action in reliance on or regarding this information. If you have received this fax in error, please notify us immediately by telephone so that we can arrange for its return to [place]. Phone: [phone], Toll-Free: [phone], Fax: [phone] Page: 2 of 2 Call Id: [id number] Disp. Time Jenna Arroyo Time) Disposition Final User 03/18/2022 10:20:29 AM See PCP within 24 Hours Yes Deaton, RN, Nash Dimmer Final  Disposition 03/18/2022 10:20:29 AM See PCP within 24 Hours Yes Deaton, RN, [name] Disagree/Comply Comply Caller Understands Yes PreDisposition Did not know what to do Care Advice Given Per Guideline SEE PCP WITHIN 24 HOURS: * IF OFFICE WILL BE OPEN: You need to be examined within the next 24 hours. Call your doctor (or NP/PA) when the office opens and make an appointment. CALL BACK IF: * Lightheadedness, weakness, or dizziness occurs * Systolic BP under 90 * You feel sick * You become worse CARE ADVICE given per Low Blood Pressure (Adult) guideline. Comments User: Wandra Scot, RN Date/Time Jenna Arroyo Time): 03/18/2022 10:15:15 AM Caller advised that she lost her insurance. User: Wandra Scot, RN Date/Time Jenna Arroyo Time): 03/18/2022 10:22:18 AM Caller denies any dizziness, lightheadedness or weakness . Advised that she is tired. User: Wandra Scot, RN Date/Time Jenna Arroyo Time): 03/18/2022 10:28:01 AM Called Melton Alar at the backline, her provider is not in. Caller advised that she can not be seen in the office, states that she can not miss work. Advised that she has an appt for Friday. Advised that she can not afford to go to UC. Melton Alar from the office was connected with caller. Referrals REFERRED TO PCP OFFICE

## 2022-03-18 NOTE — Telephone Encounter (Signed)
Triage called asking for a 24 hr turnaround. Offered pt an appointment in [place] pt declined offered UC pt declined. I let her know that I would explain her sxs to PCP/ DOD and have them formulate a plan and staff would rtn her call since there were no sooner apts than what she is scheduled for on Friday.  Sxs: Fatigue, low BP (110/79), x 3-4 days.   Alter BP meds?

## 2022-03-18 NOTE — Telephone Encounter (Signed)
I have called pt back and relayed the message from the provider to pt. She stated understanding and stated that she will see her PCP on Friday. She stated that she feels fine now and she agrees that she needs to get more hydrated.

## 2022-03-18 NOTE — Telephone Encounter (Signed)
BP Readings from Last 3 Encounters:  09/05/21 130/87  07/17/21 117/66  05/05/21 118/72   Advise patient: Continue same medications for now Good hydration Check BPs once or twice a day. BPs can be different at different times of the day, as long as they are not consistently out of range (  110/65 and  145/85.)  She is okay. See PCP in a couple of days as planned ER if chest pain, severe headache, BP severely high (more than 170).

## 2022-03-20 ENCOUNTER — Ambulatory Visit: Payer: Self-pay | Admitting: Family Medicine

## 2022-04-24 ENCOUNTER — Other Ambulatory Visit: Payer: Self-pay

## 2022-04-24 ENCOUNTER — Telehealth: Payer: Self-pay | Admitting: Psychiatry

## 2022-04-24 MED ORDER — DULOXETINE HCL 20 MG PO CPEP: 40.0000 mg | ORAL_CAPSULE | Freq: Every day | ORAL | 1 refills | Status: DC

## 2022-04-24 NOTE — Telephone Encounter (Signed)
Pt stated she is having a hard time with depression and anxiety lately.She has low motivation and needs to be able to function at work.She feels like she will benefit from increasing Cymbalta.If you agree she wants a  rx at [place], [place]

## 2022-04-24 NOTE — Telephone Encounter (Signed)
Pt informed rx sent.

## 2022-04-24 NOTE — Telephone Encounter (Signed)
Patient lvm requesting an increase on the Cymbalta to 30 mg. Please contact patient after 1:30 today to discuss. Appointment scheduled 05/27/22  Contact information # [phone]

## 2022-04-24 NOTE — Telephone Encounter (Signed)
I agree .  Her current dose is low.  We will increase duloxetine from 30 mg daily to 40 mg daily.  She will need new Rx duloxeitne 20 mg 2 daily.  #60, 1 RF.  Pls send this if she agrees.

## 2022-04-27 ENCOUNTER — Ambulatory Visit: Payer: Self-pay | Admitting: Family Medicine

## 2022-04-27 ENCOUNTER — Telehealth: Payer: Self-pay | Admitting: Psychiatry

## 2022-04-27 ENCOUNTER — Telehealth: Payer: Self-pay

## 2022-04-27 NOTE — Telephone Encounter (Signed)
OK sent rx for duloxetine 30mg  1 daily, #30 and tell them to cancel RX for duloxetine 20 mg

## 2022-04-27 NOTE — Telephone Encounter (Signed)
Pt wants 30 mg and not 40 mg.She wants to go slow due to her med sensitively.

## 2022-04-27 NOTE — Telephone Encounter (Signed)
Jenna Arroyo called and states her Cymbalta was called in for 20 mg and not 30 mg. Could someone please call in the 30 mg for her? Her phone number is [phone]

## 2022-04-28 NOTE — Telephone Encounter (Signed)
Ok if she wants to do that.  Try to be as consistent as possible with the dose of duloxetine over the next 2-4 weeks to be able to judge the benefit and SE of it.

## 2022-04-29 NOTE — Telephone Encounter (Signed)
Jenna Arroyo called today at 12:00pm to request prescription for 30mg  Cymbalta.  Sprinkling 1/2 of a capsule into yogurt or whatever is not accurate.  She would just like to have 30mg  so she can just take the one capsule.  Please in send prescription for the 30mg .to [facility] on [address], [address]

## 2022-04-30 ENCOUNTER — Other Ambulatory Visit: Payer: Self-pay

## 2022-04-30 MED ORDER — DULOXETINE HCL 30 MG PO CPEP: 30.0000 mg | ORAL_CAPSULE | Freq: Every day | ORAL | 0 refills | Status: DC

## 2022-04-30 NOTE — Telephone Encounter (Signed)
RX SENT

## 2022-05-22 ENCOUNTER — Telehealth (INDEPENDENT_AMBULATORY_CARE_PROVIDER_SITE_OTHER): Payer: Commercial Managed Care - HMO | Admitting: Medical

## 2022-05-22 ENCOUNTER — Encounter: Payer: Self-pay | Admitting: Medical

## 2022-05-22 VITALS — BP 125/80

## 2022-05-22 DIAGNOSIS — R0981 Nasal congestion: Secondary | ICD-10-CM

## 2022-05-22 DIAGNOSIS — Z1231 Encounter for screening mammogram for malignant neoplasm of breast: Secondary | ICD-10-CM

## 2022-05-22 DIAGNOSIS — M542 Cervicalgia: Secondary | ICD-10-CM | POA: Diagnosis not present

## 2022-05-22 DIAGNOSIS — M546 Pain in thoracic spine: Secondary | ICD-10-CM | POA: Diagnosis not present

## 2022-05-22 MED ORDER — LEVOCETIRIZINE DIHYDROCHLORIDE 5 MG PO TABS: 5.0000 mg | ORAL_TABLET | Freq: Every evening | ORAL | 0 refills | Status: DC

## 2022-05-22 NOTE — Progress Notes (Signed)
Subjective:    Patient ID: Jenna Arroyo, female    DOB: 1966-04-07, 56 y.o.   MRN: [id number]  HPI Virtual Visit via Video Note  I connected with Janyth Contes on 05/22/22 at  2:20 PM EST by a video enabled telemedicine application and verified that I am speaking with the correct person using two identifiers.  Location: Patient: home Provider: office   I discussed the limitations of evaluation and management by telemedicine and the availability of in person appointments. The patient expressed understanding and agreed to proceed.  History of Present Illness: Pt states recent nasal congestion that just started last night. Pt gums and back of teeth have mild sore. But she does not have pain on palpation. She has some sneezing and some drainage on back of throat.   Pt states room mate had nasal congestion and then given a z-pack.    Pt state has been using flonase nasal spray just started 3 days ago. But just once per nostril  Pt also mentions history of injury one year ago. She states fell on carpet about 10-12 steps. Ever since then she has pain in neck.  Also hx of chronic small lymph nodes in both axillaries int he past. If get sick for any reason will feel small lymph nodes. No axillary redness. No breast pain. No masses or lumps but admits no self breast exam.   Observations/Objective: General-no acute distress, pleasant, oriented. Lungs- on inspection lungs appear unlabored. Neck- mild neck pain on self palpation. Neuro- gross motor function appears intact.   Thoracic- upper t-spine tender on self pal  Assessment and Plan: / Patient Instructions  Probable allergic rhinitis. Start xyzal and use flonase 2 sprays each nosril daily. If sinus pain by Monday would advise use flonase and start antibiotic. If antibiotic needs to start on Monday would advise stopping antihistamine.  One year neck pain with upper thoracic pain since fall one year ago.Advise use tylenol and low dose  ibupofen. Get xray of c spine and t spine today.  Screening mammogram placed. If chronic small lymph nodes increase in size make appointment with pcp or other provider as need to palpate lymph nodes and do breast exam under that scenario.  Follow up as regularly scheduled with pcp or sooner if needed.   Mackie Pai, PA-C   Follow Up Instructions:    I discussed the assessment and treatment plan with the patient. The patient was provided an opportunity to ask questions and all were answered. The patient agreed with the plan and demonstrated an understanding of the instructions.   The patient was advised to call back or seek an in-person evaluation if the symptoms worsen or if the condition fails to improve as anticipated.  Mackie Pai, PA-C    Follow Up Instructions:    I discussed the assessment and treatment plan with the patient. The patient was provided an opportunity to ask questions and all were answered. The patient agreed with the plan and demonstrated an understanding of the instructions.   The patient was advised to call back or seek an in-person evaluation if the symptoms worsen or if the condition fails to improve as anticipated.     Mackie Pai, PA-C    Review of Systems  Constitutional:  Negative for chills and fatigue.  HENT:  Positive for congestion and postnasal drip. Negative for sinus pressure, sinus pain and sneezing.   Respiratory:  Negative for cough and chest tightness.   Cardiovascular:  Negative for  chest pain and palpitations.  Gastrointestinal:  Negative for abdominal pain.       Objective:   Physical Exam        Assessment & Plan:

## 2022-05-22 NOTE — Patient Instructions (Signed)
Probable allergic rhinitis. Start xyzal and use flonase 2 sprays each nosril daily. If sinus pain by Monday would advise use flonase and start antibiotic. If antibiotic needs to start on Monday would advise stopping antihistamine.  One year neck pain with upper thoracic pain since fall one year ago.Advise use tylenol and low dose ibupofen. Get xray of c spine and t spine today.  Screening mammogram placed. If chronic small lymph nodes increase in size make appointment with pcp or other provider as need to palpate lymph nodes and do breast exam under that scenario.  Follow up as regularly scheduled with pcp or sooner if needed.

## 2022-05-27 ENCOUNTER — Encounter: Payer: Self-pay | Admitting: Psychiatry

## 2022-05-27 ENCOUNTER — Ambulatory Visit (INDEPENDENT_AMBULATORY_CARE_PROVIDER_SITE_OTHER): Payer: Commercial Managed Care - HMO | Admitting: Psychiatry

## 2022-05-27 DIAGNOSIS — F4001 Agoraphobia with panic disorder: Secondary | ICD-10-CM | POA: Diagnosis not present

## 2022-05-27 DIAGNOSIS — F411 Generalized anxiety disorder: Secondary | ICD-10-CM | POA: Diagnosis not present

## 2022-05-27 DIAGNOSIS — Z638 Other specified problems related to primary support group: Secondary | ICD-10-CM | POA: Diagnosis not present

## 2022-05-27 DIAGNOSIS — F900 Attention-deficit hyperactivity disorder, predominantly inattentive type: Secondary | ICD-10-CM | POA: Diagnosis not present

## 2022-05-27 NOTE — Progress Notes (Signed)
Jenna GessLisa M Arroyo [id number] 03/18/1966 56 y.o.    Subjective:   Patient ID:  Jenna Arroyo is a 56 y.o. (DOB 01/07/1966) female.  Chief Complaint:  Chief Complaint  Patient presents with   Follow-up   Anxiety    Anxiety Symptoms include nervous/anxious behavior. Patient reports no chest pain, confusion, decreased concentration, palpitations or suicidal ideas.     Jenna GessLisa M Farro presents to the office today for follow-up of anxiety and mood.  seen October 2020.  She had been having cognitive complaints as previously noted. Still recommend trial switch to Ativan 1 mg QID to see if cognition is better.  Her cognitive problems have frequently been in a contributor to her difficulty maintaining employment.  She is fearful about having Xanax withdrawal.  This was discussed in detail.  Also discussed in detail the potential sedative effects of benzodiazepines and especially in the first week of the transition from 1 of the medications to another.  It is likely that she will be more successful with the transition if she is a little more sleepy than normal the first week by using half of each medication then if we abruptly switch medications.  That has a much lower success rate in her case because of her worry about the transition. She agrees to a trial.  Will do 1/2 of 0.5 mg tablet tablet of Xanax and 1/2 of 1 mg tablet Ativan QID for 1 week. Then DC alprazolam and take Ativan 1 mg 3 times daily or 4 times daily to control anxiety.  Call back in 2 to 3 weeks to give a report of how well she is doing with the new medication. DC hydroxyzine because of its potential to cause cognitive problems. Continue duloxetine 60 mg daily.  10/18/19 appt with the following noted and no further med changes: She had difficulty implementing the transition and then complained of insomnia with lorazepam and was switched back to alprazolam after a week.  Also CO pain and nausea.   Overall doing OK except son and D upset  with her since December.  Disc this in detail.  Hasn't seen GD's since then.  Working at job she loves but very PT. Exercising and lost 18#  02/21/20 with the following noted: Hanging in there with stress.  Not allowed to see GD still.  Doesn't know why. No Covid.  OCC BP spikes. No major med changes. Satisfied with duloxetine and Xanax.  No full panic.   Sleep waxes and wanes but lately good.  Working same job as in April and likes it. Plan: no med changes  08/21/2020 appointment with the following noted: Stopped duloxetine for a week early Feb over BP concerns from it.  Convinced BP affected by 20 mg duloxetine which she's been taking for a few months.  Felt 30 mg was too high and then it started causing problems.  When off the duloxetine for a week then BP went down. Times when doesn't have it and the most noticeable thing is feeling tired and a little disconnected.  Longest off it was 2-4 weeks in last 6 mos. Has unresponsive PCP. Chronic tiredness.  Still dealing with chronic Lyme dz.  Plan: Stop duloxetine BC of BP concerns.  Monitor and record BP and share with PCP.  Stay off for a full month and if consistently more anxious then can add pure SSRI low dose Lexapro 5 mg for anxiety.  11/18/20 TC Pt had asked to increase Lexapro to 10 mg while mo  in the hospital.  11/25/20 TC:  Pt contacted doctor on call this weekend concerning her issues with Lexapro 10 mg. Since the increase to 10 mg she has been unable to sleep, reports black circles under her eyes, gets to sleep about 1-2 am. She also reports on Saturday she had a anxiety attack, she felt it was medication related not just her anxiety. [name], NP advised her to stop Lexapro and nurse would contact her Monday.  She asks about trying just the Lexapro 5 mg, she reports it helped and it didn't effect her sleep like 10 mg.  Pt stated the lexapro made her have increased anxiety and she thinks its best if she goes back to a very low  dose of Cymbalta.She stopped Cymbalta due to it spiking her blood pressure.She says her bp was fine at the 20 mg dose. MD: ok to stop Lexapro and return to duloxetine 20  12/11/20 appt noted: Under more stress and more anxious.  Stress about 3 mos.  Dealing with condo where she lives.  Not making enough money to get basic things she needs and asking for money from parents.  Wants father to fix things in the condo but he doesn't do anything.   Place had a leak in the past.    Taking duloxetine 20 and off Lexapro and on alprazolam 0.5 mg QID.   Worries over the condition in the world and thinks the world could end and she might not be able to get the Xanax and fears withdrawal.    She feels bible is predicting end of the world soon.  But no thoughts of death were noted Plan: no med changes per her request.  02/11/2021 appointment with the following noted: Family sold condo she was in and so she moved back in with parents about 3 weeks ago.  Stressed.  Has been seeking disability for a couple of years.  Doesn't think she can work FT.  Other relationships are stressed.   Chronic difficulty with organization. Walking and losing weight but worries about BP so doesn't want to increase the duloxetine. Patient reports stable mood and denies depressed or irritable moods.  Patient has chronic difficulty with anxiety and stress. Denies appetite disturbance.  Patient reports that energy and motivation have been good.  Patient denies any difficulty with concentration.  Patient denies any suicidal ideation. No panic. Sleep is OK. Using Xanax mostly TID.  On duloxetine 20 mg daily and doesn't want to change it.. Sleep better in the day.    No questions concerns re: meds. Plan no med changes per her request  06/19/2021 appointment with the following noted: Working Therapist, nutritional retirement.  Job offers [employer] FT Conflict with sister over dependence on her parents.  Feels her sister is unreasonable.   Can't handle being under that control.  Was doing good until she started in.  Blocked her contact.  Sister blames her for parents not having enough money. Siblings forcing her to get a job. Satisfied with the meds. Upset at having to pay all her bills on her own now. Recognizes chronic difficulties managing herself well.  12/29/21 appt noted: Doing OK.  Family turned son against her.  It's tough.  Managing anxiety on current meds.  Don't want tto change the meds. Still grieving mother and dealing with family drama. On duloxetine 20 mg daily and Xanax 0.5 mg TID. Doesn't want to increase duloxetine 20 mg daily bc it increases BP.  04/24/2022 phone call  complaining of depression and anxiety with low motivation and difficulty functioning at work.  Wants to increase duloxetine. MD response to increase duloxetine to 40 mg daily 04/27/2022 phone call patient indicated she wanted to go up in the dose slowly and wanted a prescription for duloxetine 30 mg daily.  05/27/2022 appointment noted: Current psych relevant meds include vitamin D 50,000 units weekly, hydroxyzine 25 mg every 8 hours as needed anxiety,  duloxetine 30 mg capsules 1 daily ( but hasn't started yet.  Plans to tomoroow) , Xanax 0.5 mg tablets 1 5 times a day as needed anxiety.  Patient states she is taking about 3 daily. Pretty rough still trying to find another job. M died 2021/09/26 and she was in such a fog. Traumatized  by mother's death.  Shouldn't have happened. She believes F was narcissist.  He's tuned my son against me.  Conflict with all of the family.  Not ready to talk to them about it.  Estranged from family in part over eviction from family's property.   Living with mother's good friend. Plans to increase duloxetine 20 to 30 mg daily. Some arthritis issues.  Past Psychiatric Medication Trials: Duloxetine 60, imipramine, desipramine, sertraline hyper, citalopram side effects, paroxetine chest pain,  Lexapro ? Less  effective for anxiety Nuvigil edgy,  buspirone dizzy,  Xanax, Ambien anxiety, trazodone side effects, atenolol  Review of Systems:  Review of Systems  Constitutional:  Positive for fatigue.       Low exercise tolerance  Cardiovascular:  Negative for chest pain and palpitations.  Gastrointestinal:  Negative for vomiting.  Musculoskeletal:  Positive for arthralgias and neck pain.  Neurological:  Positive for headaches. Negative for tremors.       Foggy headed.  Psychiatric/Behavioral:  Positive for sleep disturbance. Negative for agitation, behavioral problems, confusion, decreased concentration, dysphoric mood, hallucinations, self-injury and suicidal ideas. The patient is nervous/anxious. The patient is not hyperactive.     Medications: I have reviewed the patient's current medications.  Current Outpatient Medications  Medication Sig Dispense Refill   ALPRAZolam (XANAX) 0.5 MG tablet TAKE ONE TABLET EVERY MORNING, TAKE ONE TABLET DAILY AT NOON, TAKE ONE TABLET EVERY EVENING AND TAKE ONE TABLET DAILY AT BEDTIME (Patient taking differently: 3 daily) 120 tablet 5   ARMOUR THYROID 15 MG tablet Take 3 tablets by mouth once daily 90 tablet 0   atenolol (TENORMIN) 25 MG tablet Take 1 tablet by mouth twice daily 180 tablet 0   Cholecalciferol (VITAMIN D) 10 MCG/ML LIQD Take by mouth.     diphenhydrAMINE (BENADRYL) 50 MG tablet Take 50 mg by mouth at bedtime as needed for itching.     DULoxetine (CYMBALTA) 20 MG capsule Take 2 capsules (40 mg total) by mouth daily. 60 capsule 1   ELDERBERRY PO Take by mouth.     EPINEPHrine (EPIPEN 2-PAK) 0.3 mg/0.3 mL IJ SOAJ injection Inject 0.3 mg into the muscle as needed for anaphylaxis. 1 each 0   hydrOXYzine (VISTARIL) 25 MG capsule Take 1 capsule (25 mg total) by mouth every 8 (eight) hours as needed. (Patient taking differently: Take 25 mg by mouth every 8 (eight) hours as needed. PRN) 60 capsule 1   irbesartan-hydrochlorothiazide (AVALIDE) 150-12.5 MG  tablet Take 2 tablets by mouth once daily 60 tablet 0   levocetirizine (XYZAL) 5 MG tablet Take 1 tablet (5 mg total) by mouth every evening. 30 tablet 0   PREBIOTIC PRODUCT PO Take by mouth. Mix in shake and drink once a day.  silver sulfADIAZINE (SILVADENE) 1 % cream Apply 1 application topically daily. 50 g 0   tizanidine (ZANAFLEX) 2 MG capsule Take 1 capsule (2 mg total) by mouth 3 (three) times daily. 30 capsule 1   Vitamin D, Ergocalciferol, (DRISDOL) 1.25 MG (50000 UNIT) CAPS capsule TAKE ONE CAPSULE BY MOUTH EVERY WEEK 12 capsule 0   DULoxetine (CYMBALTA) 30 MG capsule Take 1 capsule (30 mg total) by mouth daily. (Patient not taking: Reported on 05/27/2022) 30 capsule 0   No current facility-administered medications for this visit.    Medication Side Effects: None  Allergies:  Allergies  Allergen Reactions   Ceftriaxone Sodium In Dextrose Other (See Comments)    "heart races and cannot breathe"   Guaifenesin & Derivatives Other (See Comments)    "heart races, dizziness, light-headedness"   Meperidine Other (See Comments)    "heart races"   Other Rash   Penicillins Hives   Pseudoephedrine Other (See Comments)    "heart races and high blood pressure"   Rosuvastatin Calcium Nausea And Vomiting    Muscle aches    Simvastatin Other (See Comments)    muscle aching    Ciprofloxacin    Rocephin [Ceftriaxone] Itching    Past Medical History:  Diagnosis Date   Anxiety    High cholesterol    Hypertension    Lyme disease    Thyroid disease     Family History  Problem Relation Age of Onset   Hypertension Mother    High Cholesterol Mother    Heart disease Father    High Cholesterol Father    Hypertension Sister    High Cholesterol Sister    Hypertension Brother    High Cholesterol Brother    Colon cancer Neg Hx     Social History   Socioeconomic History   Marital status: Divorced    Spouse name: Not on file   Number of children: Not on file   Years of  education: Not on file   Highest education level: Not on file  Occupational History   Not on file  Tobacco Use   Smoking status: Former    Types: Cigarettes    Quit date: 2006    Years since quitting: 17.9   Smokeless tobacco: Never   Tobacco comments:    socially   Building services engineer Use: Never used  Substance and Sexual Activity   Alcohol use: No   Drug use: No   Sexual activity: Not Currently    Birth control/protection: Post-menopausal  Other Topics Concern   Not on file  Social History Narrative   Not on file   Social Determinants of Health   Financial Resource Strain: Not on file  Food Insecurity: Not on file  Transportation Needs: No Transportation Needs (06/18/2020)   PRAPARE - Administrator, Civil Service (Medical): No    Lack of Transportation (Non-Medical): No  Physical Activity: Not on file  Stress: Not on file  Social Connections: Not on file  Intimate Partner Violence: Not on file    Past Medical History, Surgical history, Social history, and Family history were reviewed and updated as appropriate.   Please see review of systems for further details on the patient's review from today.   Objective:   Physical Exam:  There were no vitals taken for this visit.  Physical Exam Constitutional:      General: She is not in acute distress. Musculoskeletal:        General: No deformity.  Neurological:     Mental Status: She is alert and oriented to person, place, and time.     Cranial Nerves: No dysarthria.     Coordination: Coordination normal.  Psychiatric:        Attention and Perception: Perception normal. She is inattentive. She does not perceive auditory or visual hallucinations.        Mood and Affect: Mood is anxious. Mood is not depressed. Affect is not labile, blunt, angry, tearful or inappropriate.        Speech: Speech normal. Speech is not slurred.        Behavior: Behavior normal. Behavior is cooperative.        Thought  Content: Thought content normal. Thought content is not paranoid or delusional. Thought content does not include homicidal or suicidal ideation. Thought content does not include suicidal plan.        Cognition and Memory: Cognition and memory normal.        Judgment: Judgment normal.     Comments: Insight fair.   chronically low stress tolerance. Complain of chronic forgetfulness      Lab Review:     Component Value Date/Time   NA 138 07/17/2021 0946   K 4.6 07/17/2021 0946   CL 97 07/17/2021 0946   CO2 35 (H) 07/17/2021 0946   GLUCOSE 80 07/17/2021 0946   BUN 19 07/17/2021 0946   CREATININE 0.95 07/17/2021 0946   CREATININE 0.85 04/05/2020 1052   CALCIUM 9.5 07/17/2021 0946   PROT 6.6 07/17/2021 0946   ALBUMIN 4.2 07/17/2021 0946   AST 13 07/17/2021 0946   ALT 16 07/17/2021 0946   ALKPHOS 91 07/17/2021 0946   BILITOT 0.4 07/17/2021 0946   GFRNONAA >60 02/01/2020 1513   GFRAA >60 02/01/2020 1513       Component Value Date/Time   WBC 7.6 07/17/2021 0946   RBC 4.35 07/17/2021 0946   HGB 13.2 07/17/2021 0946   HCT 39.6 07/17/2021 0946   PLT 328.0 07/17/2021 0946   MCV 90.9 07/17/2021 0946   MCH 30.3 04/05/2020 1052   MCHC 33.3 07/17/2021 0946   RDW 13.2 07/17/2021 0946   LYMPHSABS 1.7 07/17/2021 0946   MONOABS 0.4 07/17/2021 0946   EOSABS 0.2 07/17/2021 0946   BASOSABS 0.1 07/17/2021 0946    No results found for: "POCLITH", "LITHIUM"   No results found for: "PHENYTOIN", "PHENOBARB", "VALPROATE", "CBMZ"   .res Assessment: Plan:   Generalized anxiety disorder, manageable  Attention deficit hyperactivity disorder improved with duloxetine  History of panic attacks in remission with current meds  Chronic family conflict  Patient has been under our care for many years.  She has some chronic instability and anxiety and function but does generally get benefit from the medication.  She has not been able to reduce the dosage of alprazolam which she has taken for many  years due to excessive anxiety.  Chronic problems and conflicts with parents and sister and brother and other people  Wants to stay on the Cymbalta 20 instead of the Lexapro.  Thought Lexapro started causing anxiety problems but has been more stressed lately.  Afraid of increasing duloxetine because of fear will affect her blood pressure.  She failed attempted transition from alprazolam to lorazepam in October 2020.  It appears she had some withdrawal symptoms and she is gone back to alprazolam.  It was discussed with her that we could do the transition more gradually if she wanted to pursue that to see if she can get  cognitive benefit with the switch.  She does not want to do that at this time. Continue Xanax 0.5 mg QID, she's been able to limit to 3 daily.  She can continue to try hydroxyzine 25 to 50 mg every 8 hours as needed anxiety.  However if it does not help then do not take it.  Discussed side effect risks.. she doesn't want more meds.  No med changes today.  We discussed the short-term risks associated with benzodiazepines including sedation and increased fall risk among others.  Discussed long-term side effect risk including dependence, potential withdrawal symptoms, and the potential eventual dose-related risk of dementia.  But recent studies from 2020 dispute this association between benzodiazepines and dementia risk. Newer studies in 2020 do not support an association with dementia.   Has started counseling at [facility] Educated about her father's recent dx of dementia and what it means to have the dx. Supportive therapy dealing with family conflict.  FU 3-4 mos  Meredith Staggers, MD, DFAPA   Please see After Visit Summary for patient specific instructions.  Future Appointments  Date Time Provider Department Center  05/27/2022 10:30 AM Cottle, Steva Ready., MD [facility] None      No orders of the defined types were placed in this encounter.      -------------------------------

## 2022-06-04 ENCOUNTER — Other Ambulatory Visit: Payer: Self-pay

## 2022-06-04 ENCOUNTER — Telehealth: Payer: Self-pay | Admitting: Psychiatry

## 2022-06-04 MED ORDER — DULOXETINE HCL 20 MG PO CPEP: 40.0000 mg | ORAL_CAPSULE | Freq: Every day | ORAL | 1 refills | Status: DC

## 2022-06-04 NOTE — Telephone Encounter (Signed)
Rx sent 

## 2022-06-04 NOTE — Telephone Encounter (Signed)
Pt called and needs a refill on her cymbalta 30 mg. The pharmacy is [facility]  on [address] in [place]

## 2022-06-26 ENCOUNTER — Telehealth: Payer: Self-pay

## 2022-06-26 ENCOUNTER — Other Ambulatory Visit: Payer: Self-pay | Admitting: Family

## 2022-06-26 ENCOUNTER — Encounter: Payer: Self-pay | Admitting: Family

## 2022-06-26 ENCOUNTER — Ambulatory Visit (INDEPENDENT_AMBULATORY_CARE_PROVIDER_SITE_OTHER): Payer: Commercial Managed Care - HMO | Admitting: Family

## 2022-06-26 ENCOUNTER — Telehealth: Payer: Self-pay | Admitting: Family

## 2022-06-26 VITALS — BP 134/82 | HR 105 | Temp 100.3°F | Resp 18 | Ht 66.0 in | Wt 199.0 lb

## 2022-06-26 DIAGNOSIS — J069 Acute upper respiratory infection, unspecified: Secondary | ICD-10-CM

## 2022-06-26 DIAGNOSIS — J019 Acute sinusitis, unspecified: Secondary | ICD-10-CM

## 2022-06-26 MED ORDER — AZITHROMYCIN 250 MG PO TABS: ORAL_TABLET | ORAL | 0 refills | Status: DC

## 2022-06-26 MED ORDER — PROMETHAZINE HCL 25 MG PO TABS: 25.0000 mg | ORAL_TABLET | Freq: Three times a day (TID) | ORAL | 0 refills | Status: DC | PRN

## 2022-06-26 NOTE — Telephone Encounter (Signed)
Please advise 

## 2022-06-26 NOTE — Telephone Encounter (Signed)
Appt w/ Vernona Rieger today

## 2022-06-26 NOTE — Progress Notes (Signed)
Jenna Arroyo is a 56 y.o. female with the following history as recorded in Epic[employer]:  Patient Active Problem List   Diagnosis Date Noted   Cervical strain 02/18/2021   Dyspepsia 02/18/2021   Urinary frequency 12/06/2020   Hyponatremia 09/23/2020   Anemia 06/14/2020   Nausea 04/05/2020   History of panic attacks 01/23/2019   GAD (generalized anxiety disorder) 04/22/2018   ADD (attention deficit disorder) 04/22/2018   Pharyngitis 12/11/2016   Axillary lymphadenopathy 12/11/2016   Lyme disease 12/11/2016   Dyslipidemia 06/18/2016   Essential hypertension 06/18/2016   Hypothyroidism 06/18/2016    Current Outpatient Medications  Medication Sig Dispense Refill   ALPRAZolam (XANAX) 0.5 MG tablet TAKE ONE TABLET EVERY MORNING, TAKE ONE TABLET DAILY AT NOON, TAKE ONE TABLET EVERY EVENING AND TAKE ONE TABLET DAILY AT BEDTIME (Patient taking differently: 3 daily) 120 tablet 5   ARMOUR THYROID 15 MG tablet Take 3 tablets by mouth once daily 90 tablet 0   atenolol (TENORMIN) 25 MG tablet Take 1 tablet by mouth twice daily 180 tablet 0   azithromycin (ZITHROMAX Z-PAK) 250 MG tablet Take 2 tablets (500 mg) PO today, then 1 tablet (250 mg) PO daily x4 days. 6 tablet 0   Cholecalciferol (VITAMIN D) 10 MCG/ML LIQD Take by mouth.     diphenhydrAMINE (BENADRYL) 50 MG tablet Take 50 mg by mouth at bedtime as needed for itching.     DULoxetine (CYMBALTA) 20 MG capsule Take 2 capsules (40 mg total) by mouth daily. 60 capsule 1   DULoxetine (CYMBALTA) 30 MG capsule Take 1 capsule (30 mg total) by mouth daily. 30 capsule 0   ELDERBERRY PO Take by mouth.     EPINEPHrine (EPIPEN 2-PAK) 0.3 mg/0.3 mL IJ SOAJ injection Inject 0.3 mg into the muscle as needed for anaphylaxis. 1 each 0   hydrOXYzine (VISTARIL) 25 MG capsule Take 1 capsule (25 mg total) by mouth every 8 (eight) hours as needed. (Patient taking differently: Take 25 mg by mouth every 8 (eight) hours as needed. PRN) 60 capsule 1    irbesartan-hydrochlorothiazide (AVALIDE) 150-12.5 MG tablet Take 2 tablets by mouth once daily 60 tablet 0   levocetirizine (XYZAL) 5 MG tablet Take 1 tablet (5 mg total) by mouth every evening. 30 tablet 0   PREBIOTIC PRODUCT PO Take by mouth. Mix in shake and drink once a day.     promethazine (PHENERGAN) 25 MG tablet Take 1 tablet (25 mg total) by mouth every 8 (eight) hours as needed for nausea or vomiting. 20 tablet 0   silver sulfADIAZINE (SILVADENE) 1 % cream Apply 1 application topically daily. 50 g 0   tizanidine (ZANAFLEX) 2 MG capsule Take 1 capsule (2 mg total) by mouth 3 (three) times daily. 30 capsule 1   Vitamin D, Ergocalciferol, (DRISDOL) 1.25 MG (50000 UNIT) CAPS capsule TAKE ONE CAPSULE BY MOUTH EVERY WEEK 12 capsule 0   No current facility-administered medications for this visit.    Allergies: Ceftriaxone sodium in dextrose, Guaifenesin & derivatives, Meperidine, Other, Penicillins, Pseudoephedrine, Rosuvastatin calcium, Simvastatin, Ciprofloxacin, and Rocephin [ceftriaxone]  Past Medical History:  Diagnosis Date   Anxiety    High cholesterol    Hypertension    Lyme disease    Thyroid disease     Past Surgical History:  Procedure Laterality Date   ABDOMINAL EXPLORATION SURGERY     APPENDECTOMY     MASTOIDECTOMY     TONSILECTOMY/ADENOIDECTOMY WITH MYRINGOTOMY      Family History  Problem Relation  Age of Onset   Hypertension Mother    High Cholesterol Mother    Heart disease Father    High Cholesterol Father    Hypertension Sister    High Cholesterol Sister    Hypertension Brother    High Cholesterol Brother    Colon cancer Neg Hx     Social History   Tobacco Use   Smoking status: Former    Types: Cigarettes    Quit date: 2006    Years since quitting: 17.9   Smokeless tobacco: Never   Tobacco comments:    socially   Substance Use Topics   Alcohol use: No    Subjective:  Started with sudden onset body aches, headache on Monday; persistent sore  throat on Monday of this week; using OTC Tylenol for symptom relief; asked specifically for strep test since this is going around her office; COVID test was negative last night; unable to take too many cough/ cold medications due to allergies;    Objective:  Vitals:   06/26/22 0859  BP: 134/82  Pulse: (!) 105  Resp: 18  Temp: 100.3 F (37.9 C)  TempSrc: Oral  SpO2: 99%  Weight: 199 lb (90.3 kg)  Height: 5\' 6"  (1.676 m)    General: Well developed, well nourished, in no acute distress  Skin : Warm and dry.  Head: Normocephalic and atraumatic  Eyes: Sclera and conjunctiva clear; pupils round and reactive to light; extraocular movements intact  Ears: External normal; canals clear; tympanic membranes normal  Oropharynx: Pink, supple. No suspicious lesions  Neck: Supple without thyromegaly, adenopathy  Lungs: Respirations unlabored; clear to auscultation bilaterally without wheeze, rales, rhonchi  CVS exam: normal rate and regular rhythm.  Neurologic: Alert and oriented; speech intact; face symmetrical; moves all extremities well; CNII-XII intact without focal deficit   Assessment:  1. URI with cough and congestion   2. Acute sinusitis, recurrence not specified, unspecified location     Plan:   Rapid strep is negative; home COVID was negative; suspect flu but since she is out of the 48 hour window, will hold rapid test; will treat for secondary infection with Z-pak- patient has taken this and done well in the past; she is given Rx for Phenergan to use as needed to help with nausea; follow up worse, no better.   No follow-ups on file.  No orders of the defined types were placed in this encounter.   Requested Prescriptions   Signed Prescriptions Disp Refills   promethazine (PHENERGAN) 25 MG tablet 20 tablet 0    Sig: Take 1 tablet (25 mg total) by mouth every 8 (eight) hours as needed for nausea or vomiting.   azithromycin (ZITHROMAX Z-PAK) 250 MG tablet 6 tablet 0    Sig: Take 2  tablets (500 mg) PO today, then 1 tablet (250 mg) PO daily x4 days.

## 2022-06-26 NOTE — Telephone Encounter (Signed)
Pt was seen by Vernona Rieger today and wanted to pass along how great she was. Also she wanted to ask a question, she lives with an 56 y/o woman who seems to be developing a little bit of nausea and is not sure if it is related to what the pt was seen for. With this being said the older woman wanted to see her 44 y/o sister for Christmas Eve/Christmas day but does not want her to get catch what they have. Please advise with any recommendations.

## 2022-06-26 NOTE — Telephone Encounter (Signed)
Nurse Assessment Nurse: Vear Clock, RN, Elease Hashimoto Date/Time Jenna Arroyo Time): 06/26/2022 7:24:19 AM Confirm and document reason for call. If symptomatic, describe symptoms. ---She is tired, cough, headache, sore throat, and nauseous. She did vomit x 2 but no blood and has urinated in the past 8 hours. She feels very warm. Does the patient have any new or worsening symptoms? ---Yes Will a triage be completed? ---Yes Related visit to physician within the last 2 weeks? ---No Does the PT have any chronic conditions? (i.e. diabetes, asthma, this includes High risk factors for pregnancy, etc.) ---Yes List chronic conditions. ---HTN, thyroid dx, hx of lyme dx, Is this a behavioral health or substance abuse call? ---No Guidelines Guideline Title Affirmed Question Affirmed Notes Nurse Date/Time Jenna Arroyo Time) Sinus Pain or Congestion [1] SEVERE headache AND [2] fever Jenna Arroyo 06/26/2022 7:26:06 AM Disp. Time Jenna Arroyo Time) Disposition Final User 06/26/2022 7:27:58 AM Go to ED Now (or PCP triage) Yes Vear Clock, RN, Elease Hashimoto Final Disposition 06/26/2022 7:27:58 AM Go to ED Now (or PCP triage) Yes Vear Clock, RN, Janalyn Harder NOTE: All timestamps contained within this report are represented as [place] Standard Time. CONFIDENTIALTY NOTICE: This fax transmission is intended only for the addressee. It contains information that is legally privileged, confidential or otherwise protected from use or disclosure. If you are not the intended recipient, you are strictly prohibited from reviewing, disclosing, copying using or disseminating any of this information or taking any action in reliance on or regarding this information. If you have received this fax in error, please notify us immediately by telephone so that we can arrange for its return to [place]. Phone: [phone], Toll-Free: [phone], Fax: [phone] Page: 2 of 2 Call Id: [id number] Caller Disagree/Comply Comply Caller  Understands Yes PreDisposition InappropriateToAsk Care Advice Given Per Guideline GO TO ED NOW (OR PCP TRIAGE): ANOTHER ADULT SHOULD DRIVE: * It is better and safer if another adult drives instead of you. CARE ADVICE given per Sinus Pain or Congestion (Adult) guideline. BRING MEDICINES: * Please bring a list of your current medicines when you go to see the doctor. * It is also a good idea to bring the pill bottles too. This will help the doctor to make certain you are taking the right medicines and the right dose. Referrals REFERRED TO PCP OFFICE

## 2022-06-30 ENCOUNTER — Telehealth: Payer: Self-pay | Admitting: Family Medicine

## 2022-06-30 NOTE — Telephone Encounter (Signed)
FYI: Pt has another message pending (waiting for Jenna Arroyo to respond).

## 2022-06-30 NOTE — Telephone Encounter (Signed)
Pt seen on 12/22 for URI with cough and congestion. Please advise

## 2022-06-30 NOTE — Telephone Encounter (Signed)
Pt would also like to know when she can go back to work. She has been sick for about 6 days. She was also exposed to covid 12/25. She would like  call back.

## 2022-06-30 NOTE — Telephone Encounter (Signed)
Pt saw LM on 12/22 and would like to know if her or pcp could call in an inhaler. Stated she has one but it is very old and does not want to get sick. Please advise.   [facility] [address] [place], [place] - [address], [place] [address] Phone: [phone]  Fax: [phone]

## 2022-07-01 ENCOUNTER — Other Ambulatory Visit: Payer: Self-pay | Admitting: Psychiatry

## 2022-07-02 ENCOUNTER — Other Ambulatory Visit: Payer: Self-pay

## 2022-07-02 DIAGNOSIS — J209 Acute bronchitis, unspecified: Secondary | ICD-10-CM

## 2022-07-02 DIAGNOSIS — R0982 Postnasal drip: Secondary | ICD-10-CM

## 2022-07-02 MED ORDER — ALBUTEROL SULFATE HFA 108 (90 BASE) MCG/ACT IN AERS: 1.0000 | INHALATION_SPRAY | Freq: Four times a day (QID) | RESPIRATORY_TRACT | 0 refills | Status: DC | PRN

## 2022-07-02 NOTE — Telephone Encounter (Signed)
Pt called in 12/27 to request RF of Cymbalta 30mg  to go to [facility]. [facility] [place], [place] - [place] [address]  [address] [place]

## 2022-07-02 NOTE — Telephone Encounter (Signed)
Duplicate message . Closing

## 2022-07-03 ENCOUNTER — Other Ambulatory Visit: Payer: Self-pay

## 2022-07-03 DIAGNOSIS — R0982 Postnasal drip: Secondary | ICD-10-CM

## 2022-07-03 DIAGNOSIS — J209 Acute bronchitis, unspecified: Secondary | ICD-10-CM

## 2022-07-03 MED ORDER — ALBUTEROL SULFATE HFA 108 (90 BASE) MCG/ACT IN AERS: 1.0000 | INHALATION_SPRAY | Freq: Four times a day (QID) | RESPIRATORY_TRACT | 0 refills | Status: DC | PRN

## 2022-07-03 NOTE — Telephone Encounter (Signed)
Patient called back stating that her prescription for albuterol (VENTOLIN HFA) 108 (90 Base) MCG/ACT inhaler was called into the wrong [facility] pharmacy. She stated it is supposed to be sent to the  [facility] [address] [place], [place] - [address]

## 2022-07-03 NOTE — Telephone Encounter (Signed)
Rx resent.

## 2022-07-22 ENCOUNTER — Other Ambulatory Visit: Payer: Self-pay

## 2022-07-22 ENCOUNTER — Telehealth: Payer: Self-pay | Admitting: Family Medicine

## 2022-07-22 DIAGNOSIS — E559 Vitamin D deficiency, unspecified: Secondary | ICD-10-CM

## 2022-07-22 MED ORDER — VITAMIN D (ERGOCALCIFEROL) 1.25 MG (50000 UNIT) PO CAPS: 50000.0000 [IU] | ORAL_CAPSULE | ORAL | 0 refills | Status: DC

## 2022-07-22 NOTE — Telephone Encounter (Signed)
Refill sent.

## 2022-07-22 NOTE — Telephone Encounter (Signed)
Medication: Vitamin D, Ergocalciferol, (DRISDOL) 1.25 MG (50000 UNIT) CAPS capsule   Has the patient contacted their pharmacy? No.  Preferred Pharmacy: *new [place], [place] [address], [place] [address] Phone: [phone]  Fax: [phone]

## 2022-07-27 ENCOUNTER — Other Ambulatory Visit: Payer: Self-pay

## 2022-07-27 ENCOUNTER — Telehealth: Payer: Self-pay | Admitting: Psychiatry

## 2022-07-27 DIAGNOSIS — Z8659 Personal history of other mental and behavioral disorders: Secondary | ICD-10-CM

## 2022-07-27 DIAGNOSIS — F411 Generalized anxiety disorder: Secondary | ICD-10-CM

## 2022-07-27 MED ORDER — ALPRAZOLAM 0.5 MG PO TABS: ORAL_TABLET | ORAL | 3 refills | Status: DC

## 2022-07-27 NOTE — Telephone Encounter (Signed)
Pended. Updated pharmacy profile as requested.

## 2022-07-27 NOTE — Telephone Encounter (Signed)
Patient lvm at 10:39 with refill request for Alprazolam 0.5mg . She has changed pharmacies and would like all medications sent to [place], [place] Ph: [phone]

## 2022-08-03 ENCOUNTER — Other Ambulatory Visit: Payer: Self-pay | Admitting: Family Medicine

## 2022-08-03 ENCOUNTER — Telehealth: Payer: Self-pay | Admitting: Family Medicine

## 2022-08-03 ENCOUNTER — Other Ambulatory Visit: Payer: Self-pay

## 2022-08-03 MED ORDER — ARMOUR THYROID 15 MG PO TABS: 45.0000 mg | ORAL_TABLET | Freq: Every day | ORAL | 0 refills | Status: DC

## 2022-08-03 MED ORDER — THYROID 15 MG PO TABS: 45.0000 mg | ORAL_TABLET | Freq: Every day | ORAL | 0 refills | Status: DC

## 2022-08-03 NOTE — Telephone Encounter (Signed)
Prescription Request  08/03/2022  Is this a "Controlled Substance" medicine? No  LOV: Visit date not found  What is the name of the medication or equipment? ARMOUR THYROID 15 MG tablet [[id number]]   Have you contacted your pharmacy to request a refill? Yes   Which pharmacy would you like this sent to?  [facility] - [place], [place] - [address] [place] [address] Phone: [phone] Fax: [phone]    Patient notified that their request is being sent to the clinical staff for review and that they should receive a response within 2 business days.   Please advise at Mobile [phone] (mobile)

## 2022-08-03 NOTE — Telephone Encounter (Signed)
Pt has been getting NP thyroid and rx was written for name brand armour thyroid. Rx sent in for NP thyroid.  Pt also due for follow up.  Letter sent to pt to schedule follow up.

## 2022-08-03 NOTE — Telephone Encounter (Signed)
I refilled what was on her med list but patient states she is on a different thyroid medication. Please advise

## 2022-08-03 NOTE — Telephone Encounter (Signed)
Caller Name Sutton Phone Number [phone] Patient Name Jenna Arroyo Patient DOB Dec 02, 1965 Call Type Message Only Information Provided Reason for Call Request for General Office Information Initial Comment Caller wanted to leave a voice mail. They were calling a refill of her thyroid med, but [facility] never received it. Declined triage. Additional Comment [facility] [address]. Advised to follow up with the office on Monday if they don't call her first. Disp. Time Disposition Final User 08/01/2022 4:04:30 PM General Information Provided Yes Norton Blizzard Call Closed By: Norton Blizzard Transaction Date/Time: 08/01/2022 4:00:45 PM (ET)

## 2022-08-03 NOTE — Telephone Encounter (Signed)
error 

## 2022-08-03 NOTE — Telephone Encounter (Signed)
Refills sent

## 2022-08-03 NOTE — Telephone Encounter (Signed)
Med sent for what pt been getting which was the NP thyroid.

## 2022-08-03 NOTE — Telephone Encounter (Signed)
Pharmacy states pt usually gets generic for ARMOUR THYROID 15 MG tablet  so they can substitute. They are hoping to get that done today as pt does not have any more medication.    [facility] [address] [place], [place] - [address], [place] [address] Phone: [phone]  Fax: [phone]

## 2022-08-03 NOTE — Telephone Encounter (Signed)
Patient called back to advise she needs NP Thyroid, not the brand Armour. She said she has no insurance and cannot afford the brand. She usually gets the NP thyroid. Patient has been without for last 3 days and is extremely exhausted. Please send corrected rx for her.

## 2022-08-06 ENCOUNTER — Telehealth: Payer: Self-pay | Admitting: Psychiatry

## 2022-08-06 NOTE — Telephone Encounter (Signed)
Pt called @ 1:25p.  She thought she had an upcoming appt, but did not.  She is asking how she can taper off the Xanax before the appt she made today.  She is asking about the "[place] Manual" as a method she has read about to taper off the medicine.    Next appt 3/28

## 2022-08-06 NOTE — Telephone Encounter (Signed)
Please advise,pt requesting to taper off xanax will document reason why when I call her back

## 2022-08-09 IMAGING — MG DIGITAL DIAGNOSTIC BILAT W/ TOMO W/ CAD
6 of 12 series · 6 of 36 positions shown · non-contrast
Comparison: Previous exam(s).

CLINICAL DATA: 54-year-old with chronic Lyme disease, presenting
with diffuse BILATERAL breast pain and possible palpable lumps in
both axilla.

EXAM:
DIGITAL DIAGNOSTIC BILATERAL MAMMOGRAM WITH CAD AND TOMO
ULTRASOUND BILATERAL AXILLAE

[R MLO synth-2D]
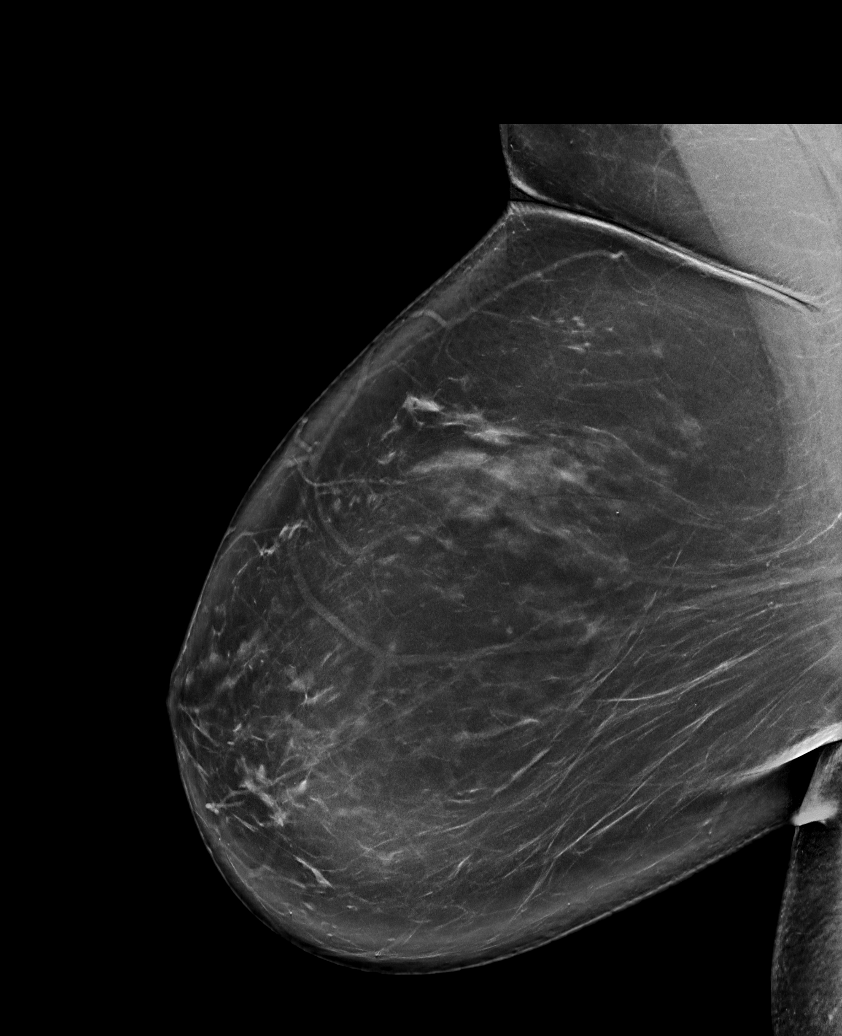

[L CC synth-2D]
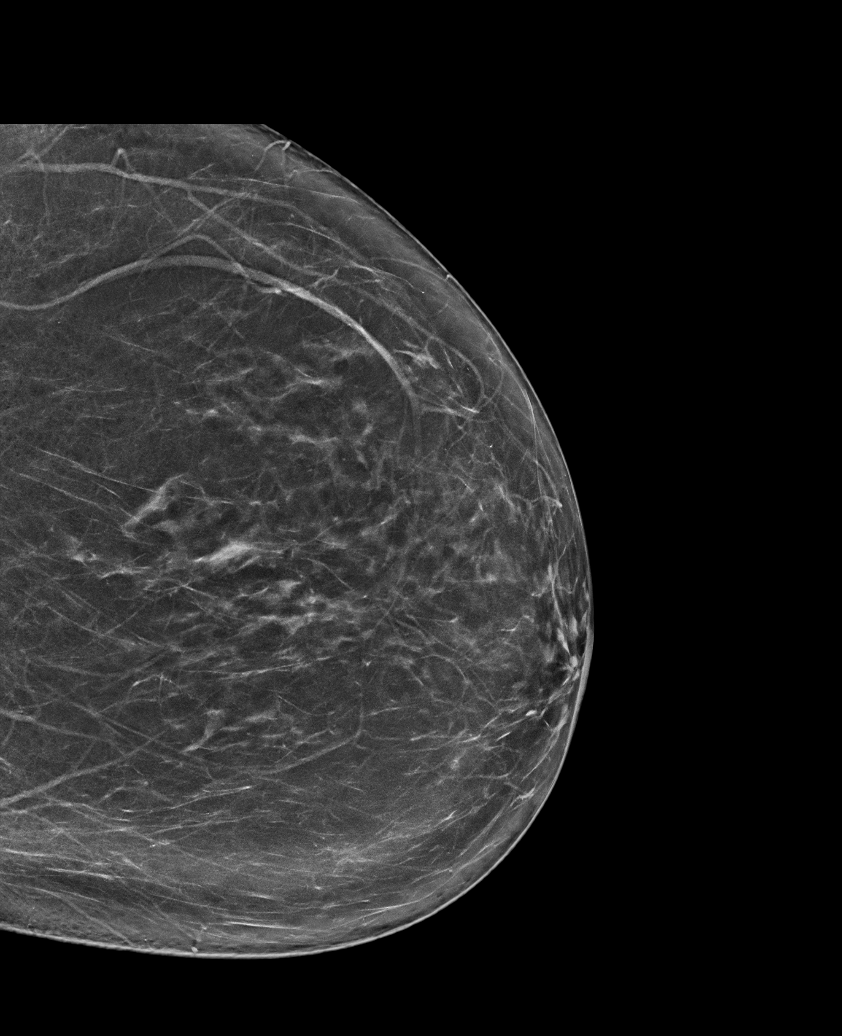

[R TAN synth-2D]
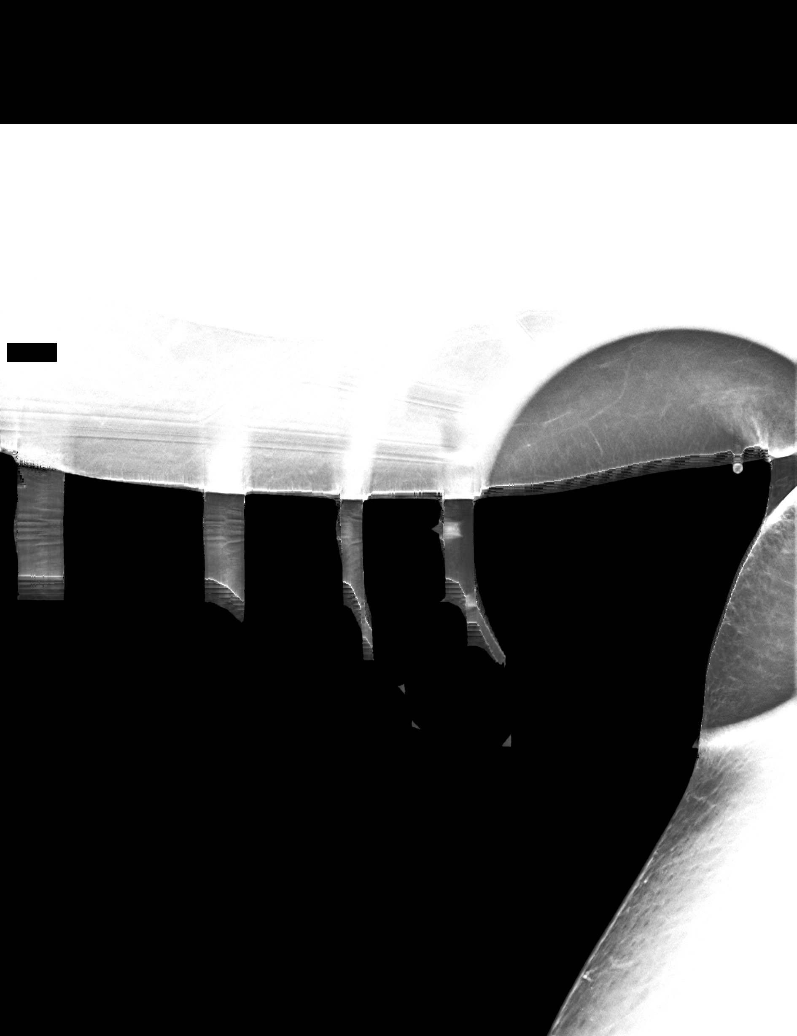

[L TAN synth-2D]
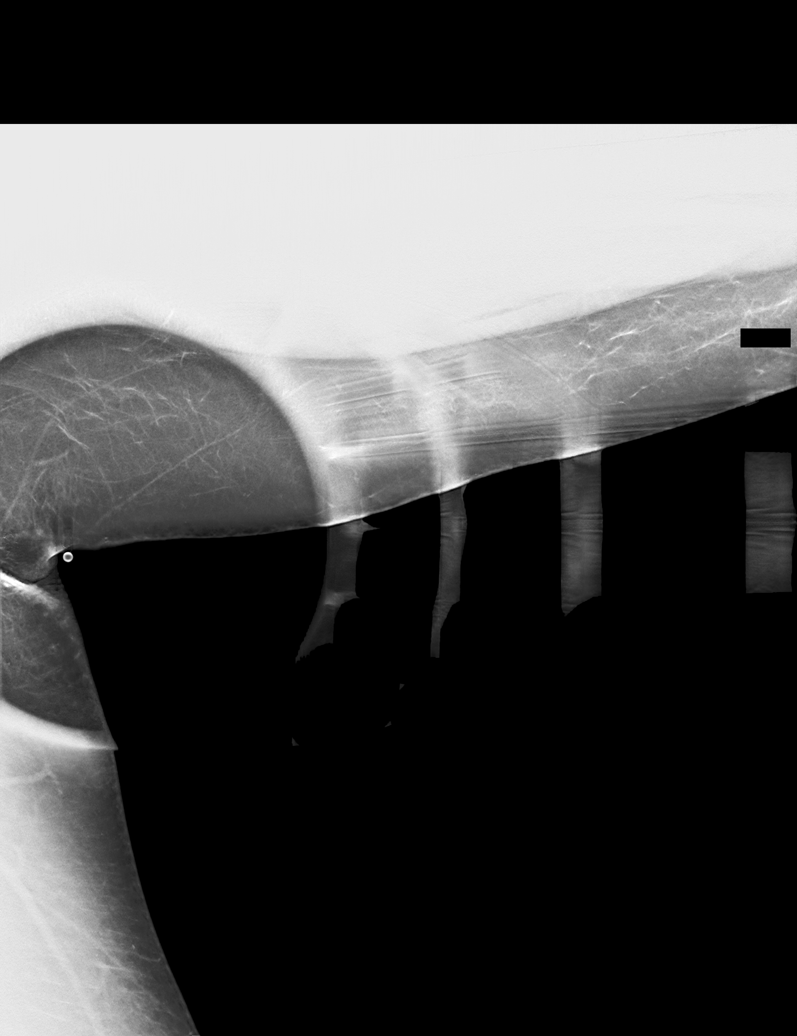

[L MLO synth-2D]
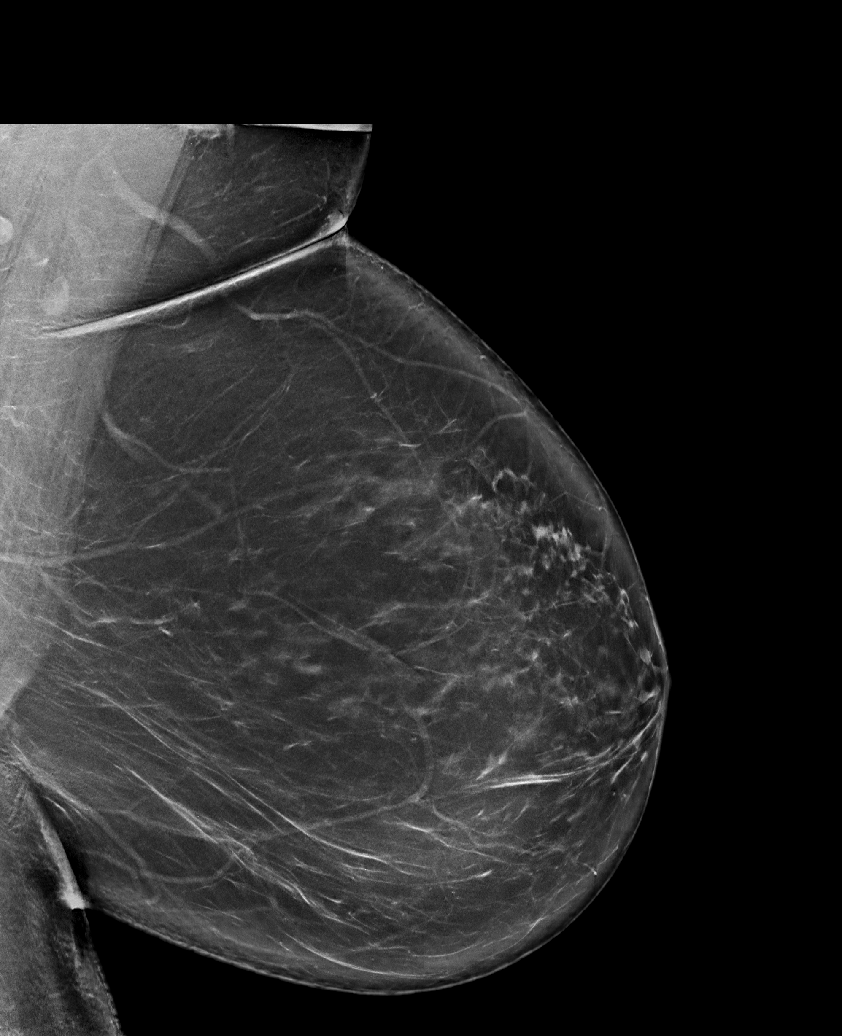

[R CC synth-2D]
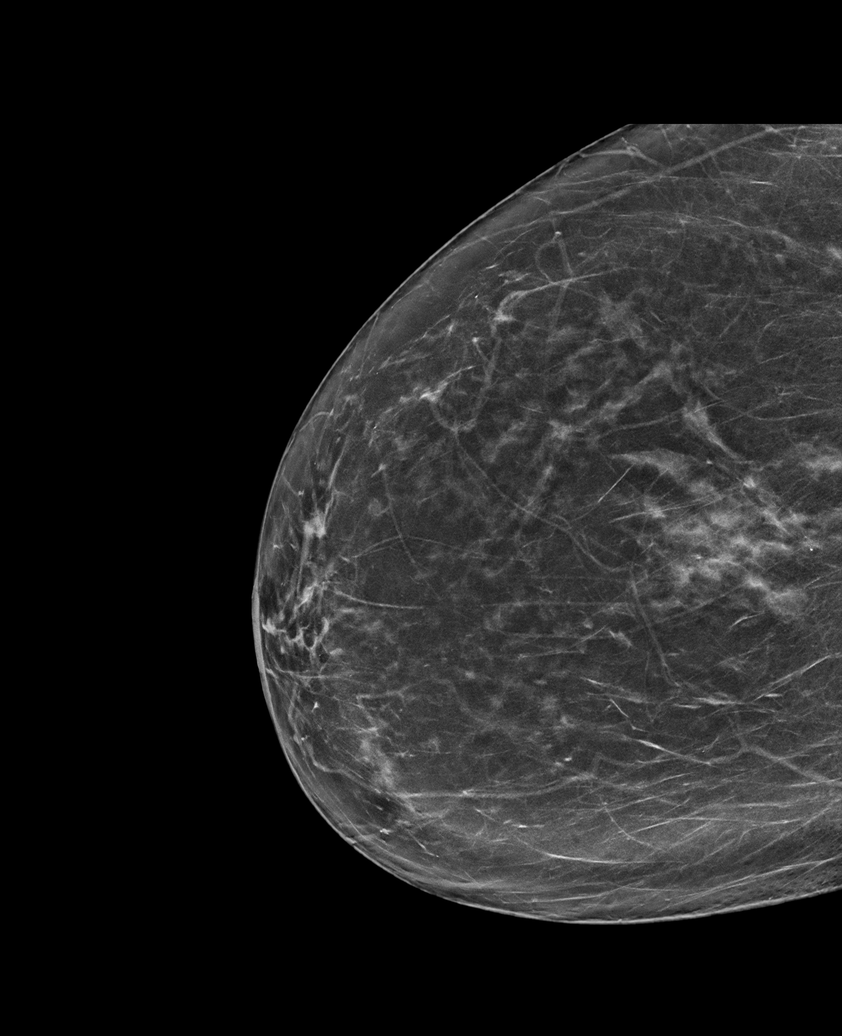

[6 of 36 positions shown; findings below may reference images not displayed]

ACR Breast Density Category b: There are scattered areas of
fibroglandular density.
FINDINGS: Tomosynthesis and synthesized full field CC and MLO views of both
breasts were obtained. Tomosynthesis and synthesized spot tangential
views of the areas of palpable concern in both axillae were also
obtained. Mammographic images were processed with CAD.

RIGHT: Asymmetric focally dense normal fibroglandular tissue in the
UPPER breast at POSTERIOR depth, unchanged since the 2415 mammogram,
confirming benignity. No visible mass or lymphadenopathy in the
RIGHT axilla. No findings suspicious for malignancy.

Targeted RIGHT axillary ultrasound is performed, showing normal
fibrofatty tissue. There is no pathologic lymphadenopathy. The
triceps muscle is present in the area of palpable concern.

LEFT: No findings suspicious for malignancy. No visible mass or
lymphadenopathy in the LEFT axilla.

Targeted LEFT axillary ultrasound is performed, showing normal
fibrofatty tissue. There is no pathologic lymphadenopathy. Again,
the triceps muscle is present in the area of palpable concern.

On correlative physical exam, there is palpable tissue in the upper
axilla bilaterally, and on ultrasound, these palpable tissues
correspond to the triceps muscle.
IMPRESSION: 1. No mammographic evidence of malignancy involving either breast.
2. No pathologic lymphadenopathy involving either axilla.

RECOMMENDATION:
Screening mammogram in one year.(Code:C9-W-EWI)

I have discussed the findings and recommendations with the patient.
If applicable, a reminder letter will be sent to the patient
regarding the next appointment.

BI-RADS CATEGORY  1: Negative.

## 2022-08-20 NOTE — Telephone Encounter (Signed)
Would be best to wait until her appt with me to discuss this.  There is a lot to discuss about how to do it.

## 2022-08-21 NOTE — Telephone Encounter (Signed)
Told patient that Dr. Clovis Pu preferred to discuss this in her next appt and she was agreeable.

## 2022-08-21 NOTE — Telephone Encounter (Signed)
LVM to RC 

## 2022-08-31 ENCOUNTER — Telehealth: Payer: Self-pay | Admitting: Psychiatry

## 2022-08-31 NOTE — Telephone Encounter (Signed)
Jenna Arroyo called at 2:30 to ask if you have a recommendation for a counselor who treatments narcissistics abuse

## 2022-09-01 NOTE — Telephone Encounter (Signed)
Please see message in regards to a therapist recommendation.

## 2022-09-04 NOTE — Telephone Encounter (Signed)
Sorry, due to shortage of therapists I don't have anyone to recommend.

## 2022-09-09 ENCOUNTER — Other Ambulatory Visit: Payer: Self-pay | Admitting: Family Medicine

## 2022-09-09 DIAGNOSIS — E559 Vitamin D deficiency, unspecified: Secondary | ICD-10-CM

## 2022-10-01 ENCOUNTER — Encounter: Payer: Self-pay | Admitting: Psychiatry

## 2022-10-01 ENCOUNTER — Ambulatory Visit (INDEPENDENT_AMBULATORY_CARE_PROVIDER_SITE_OTHER): Payer: Medicaid Other | Admitting: Psychiatry

## 2022-10-01 DIAGNOSIS — Z8659 Personal history of other mental and behavioral disorders: Secondary | ICD-10-CM | POA: Diagnosis not present

## 2022-10-01 DIAGNOSIS — F900 Attention-deficit hyperactivity disorder, predominantly inattentive type: Secondary | ICD-10-CM

## 2022-10-01 DIAGNOSIS — F411 Generalized anxiety disorder: Secondary | ICD-10-CM

## 2022-10-01 DIAGNOSIS — F4001 Agoraphobia with panic disorder: Secondary | ICD-10-CM

## 2022-10-01 MED ORDER — ALPRAZOLAM 0.5 MG PO TABS: ORAL_TABLET | ORAL | 3 refills | Status: DC

## 2022-10-01 MED ORDER — DULOXETINE HCL 30 MG PO CPEP: 30.0000 mg | ORAL_CAPSULE | Freq: Every day | ORAL | 1 refills | Status: DC

## 2022-10-01 NOTE — Progress Notes (Signed)
[name] [id number] 15-Apr-1966 57 y.o.    Subjective:   Patient ID:  Jenna Arroyo is a 57 y.o. (DOB [birth year]) female.  Chief Complaint:  Chief Complaint  Patient presents with   Follow-up   Depression   Anxiety    Anxiety Symptoms include nervous/anxious behavior. Patient reports no chest pain, confusion, decreased concentration, palpitations or suicidal ideas.     Jenna Arroyo presents to the office today for follow-up of anxiety and mood.  seen October 2020.  Jenna Arroyo had been having cognitive complaints as previously noted. Still recommend trial switch to Ativan 1 mg QID to see if cognition is better.  Jenna Arroyo cognitive problems have frequently been in a contributor to Jenna Arroyo difficulty maintaining employment.  Jenna Arroyo is fearful about having Xanax withdrawal.  This was discussed in detail.  Also discussed in detail the potential sedative effects of benzodiazepines and especially in the first week of the transition from 1 of the medications to another.  It is likely that Jenna Arroyo will be more successful with the transition if Jenna Arroyo is a little more sleepy than normal the first week by using half of each medication then if we abruptly switch medications.  That has a much lower success rate in Jenna Arroyo case because of Jenna Arroyo worry about the transition. Jenna Arroyo agrees to a trial.  Will do 1/2 of 0.5 mg tablet tablet of Xanax and 1/2 of 1 mg tablet Ativan QID for 1 week. Then DC alprazolam and take Ativan 1 mg 3 times daily or 4 times daily to control anxiety.  Call back in 2 to 3 weeks to give a report of how well Jenna Arroyo is doing with the new medication. DC hydroxyzine because of its potential to cause cognitive problems. Continue duloxetine 60 mg daily.  10/18/19 appt with the following noted and no further med changes: Jenna Arroyo had difficulty implementing the transition and then complained of insomnia with lorazepam and was switched back to alprazolam after a week.  Also CO pain and nausea.   Overall doing OK except son  and D upset with Jenna Arroyo since December.  Disc this in detail.  Hasn't seen GD's since then.  Working at job Jenna Arroyo loves but very PT. Exercising and lost 18#  02/21/20 with the following noted: Hanging in there with stress.  Not allowed to see GD still.  Doesn't know why. No Covid.  OCC BP spikes. No major med changes. Satisfied with duloxetine and Xanax.  No full panic.   Sleep waxes and wanes but lately good.  Working same job as in April and likes it. Plan: no med changes  08/21/2020 appointment with the following noted: Stopped duloxetine for a week early Feb over BP concerns from it.  Convinced BP affected by 20 mg duloxetine which Jenna Arroyo's been taking for a few months.  Felt 30 mg was too high and then it started causing problems.  When off the duloxetine for a week then BP went down. Times when doesn't have it and the most noticeable thing is feeling tired and a little disconnected.  Longest off it was 2-4 weeks in last 6 mos. Has unresponsive PCP. Chronic tiredness.  Still dealing with chronic Lyme dz.  Plan: Stop duloxetine BC of BP concerns.  Monitor and record BP and share with PCP.  Stay off for a full month and if consistently more anxious then can add pure SSRI low dose Lexapro 5 mg for anxiety.  11/18/20 TC Pt had asked to increase Lexapro to 10  mg while mo in the hospital.  11/25/20 TC:  Pt contacted doctor on call this weekend concerning Jenna Arroyo issues with Lexapro 10 mg. Since the increase to 10 mg Jenna Arroyo has been unable to sleep, reports black circles under Jenna Arroyo eyes, gets to sleep about 1-2 am. Jenna Arroyo also reports on Saturday Jenna Arroyo had a anxiety attack, Jenna Arroyo felt it was medication related not just Jenna Arroyo anxiety. [name], NP advised Jenna Arroyo to stop Lexapro and nurse would contact Jenna Arroyo Monday.  Jenna Arroyo asks about trying just the Lexapro 5 mg, Jenna Arroyo reports it helped and it didn't effect Jenna Arroyo sleep like 10 mg.  Pt stated the lexapro made Jenna Arroyo have increased anxiety and Jenna Arroyo thinks its best if Jenna Arroyo goes back to a  very low dose of Cymbalta.Jenna Arroyo stopped Cymbalta due to it spiking Jenna Arroyo blood pressure.Jenna Arroyo says Jenna Arroyo bp was fine at the 20 mg dose. MD: ok to stop Lexapro and return to duloxetine 20  12/11/20 appt noted: Under more stress and more anxious.  Stress about 3 mos.  Dealing with condo where Jenna Arroyo lives.  Not making enough money to get basic things Jenna Arroyo needs and asking for money from parents.  Wants father to fix things in the condo but he doesn't do anything.   Place had a leak in the past.    Taking duloxetine 20 and off Lexapro and on alprazolam 0.5 mg QID.   Worries over the condition in the world and thinks the world could end and Jenna Arroyo might not be able to get the Xanax and fears withdrawal.    Jenna Arroyo feels bible is predicting end of the world soon.  But no thoughts of death were noted Plan: no med changes per Jenna Arroyo request.  02/11/2021 appointment with the following noted: Family sold condo Jenna Arroyo was in and so Jenna Arroyo moved back in with parents about 3 weeks ago.  Stressed.  Has been seeking disability for a couple of years.  Doesn't think Jenna Arroyo can work FT.  Other relationships are stressed.   Chronic difficulty with organization. Walking and losing weight but worries about BP so doesn't want to increase the duloxetine. Patient reports stable mood and denies depressed or irritable moods.  Patient has chronic difficulty with anxiety and stress. Denies appetite disturbance.  Patient reports that energy and motivation have been good.  Patient denies any difficulty with concentration.  Patient denies any suicidal ideation. No panic. Sleep is OK. Using Xanax mostly TID.  On duloxetine 20 mg daily and doesn't want to change it.. Sleep better in the day.    No questions concerns re: meds. Plan no med changes per Jenna Arroyo request  06/19/2021 appointment with the following noted: Working Armed forces technical officer retirement.  Job offers [employer] and [employer] with sister over dependence on Jenna Arroyo parents.  Feels Jenna Arroyo sister is  unreasonable.  Can't handle being under that control.  Was doing good until Jenna Arroyo started in.  Blocked Jenna Arroyo contact.  Sister blames Jenna Arroyo for parents not having enough money. Siblings forcing Jenna Arroyo to get a job. Satisfied with the meds. Upset at having to pay all Jenna Arroyo bills on Jenna Arroyo own now. Recognizes chronic difficulties managing herself well.  12/29/21 appt noted: Doing OK.  Family turned son against Jenna Arroyo.  It's tough.  Managing anxiety on current meds.  Don't want tto change the meds. Still grieving mother and dealing with family drama. On duloxetine 20 mg daily and Xanax 0.5 mg TID. Doesn't want to increase duloxetine 20 mg daily bc it increases BP.  04/24/2022 phone call complaining of depression and anxiety with low motivation and difficulty functioning at work.  Wants to increase duloxetine. MD response to increase duloxetine to 40 mg daily 04/27/2022 phone call patient indicated Jenna Arroyo wanted to go up in the dose slowly and wanted a prescription for duloxetine 30 mg daily.  05/27/2022 appointment noted: Current psych relevant meds include vitamin D 50,000 units weekly, hydroxyzine 25 mg every 8 hours as needed anxiety,  duloxetine 30 mg capsules 1 daily ( but hasn't started yet.  Plans to tomoroow) , Xanax 0.5 mg tablets 1 5 times a day as needed anxiety.  Patient states Jenna Arroyo is taking about 3 daily. Pretty rough still trying to find another job. M died 28-Sep-2021 and Jenna Arroyo was in such a fog. Traumatized  by mother's death.  Shouldn't have happened. Jenna Arroyo believes F was narcissist.  He's tuned my son against me.  Conflict with all of the family.  Not ready to talk to them about it.  Estranged from family in part over eviction from family's property.   Living with mother's good friend. Plans to increase duloxetine 20 to 30 mg daily. Some arthritis issues.  08/06/22 TC about particular technique for tapering Xanax.  Jenna Arroyo was advised this is complicated and should be discussed at an appointment.  10/01/22 appt  noted: Current Alprazolam 0.5 mg TID, duloxetine 30 mg daily.   No SE. Thinks duloxetine 15 would be the perfect dose.  60 is too much.  Feels better with 30 mg daily. Back to walking and BP has gone down. Biggest stress job interviews and trying to find decent paying job.   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 Last employed before [employer], lasted 3 mos.  Didn't feel adequate training.   Disc Jenna Arroyo phone call about tapering Xanax.  Fears shortages of the med in the future giving what's going on in world.   Past Psychiatric Medication Trials: Duloxetine 60, imipramine, desipramine, sertraline hyper, citalopram side effects, paroxetine chest pain,  Lexapro ? Less effective for anxiety Nuvigil edgy,  buspirone dizzy,  Xanax, clonazepam Ambien anxiety, trazodone side effects,  atenolol  Review of Systems:  Review of Systems  Constitutional:  Positive for fatigue.       Low exercise tolerance  Cardiovascular:  Negative for chest pain and palpitations.  Gastrointestinal:  Negative for vomiting.  Musculoskeletal:  Positive for arthralgias and neck pain.  Neurological:  Positive for headaches. Negative for tremors.       Foggy headed.  Psychiatric/Behavioral:  Positive for sleep disturbance. Negative for agitation, behavioral problems, confusion, decreased concentration, dysphoric mood, hallucinations, self-injury and suicidal ideas. The patient is nervous/anxious. The patient is not hyperactive.     Medications: I have reviewed the patient's current medications.  Current Outpatient Medications  Medication Sig Dispense Refill   albuterol (VENTOLIN HFA) 108 (90 Base) MCG/ACT inhaler Inhale 1-2 puffs into the lungs every 6 (six) hours as needed. 1 each 0   atenolol (TENORMIN) 25 MG tablet Take 1 tablet by mouth twice daily 180 tablet 0   azithromycin (ZITHROMAX Z-PAK) 250 MG tablet Take 2 tablets (500 mg) PO today, then 1 tablet (250 mg) PO daily x4 days. 6 tablet 0   Cholecalciferol  (VITAMIN D) 10 MCG/ML LIQD Take by mouth.     diphenhydrAMINE (BENADRYL) 50 MG tablet Take 50 mg by mouth at bedtime as needed for itching.     ELDERBERRY PO Take by mouth.     EPINEPHrine (EPIPEN 2-PAK) 0.3  mg/0.3 mL IJ SOAJ injection Inject 0.3 mg into the muscle as needed for anaphylaxis. 1 each 0   hydrOXYzine (VISTARIL) 25 MG capsule Take 1 capsule (25 mg total) by mouth every 8 (eight) hours as needed. (Patient taking differently: Take 25 mg by mouth every 8 (eight) hours as needed. PRN) 60 capsule 1   irbesartan-hydrochlorothiazide (AVALIDE) 150-12.5 MG tablet Take 2 tablets by mouth once daily 60 tablet 0   levocetirizine (XYZAL) 5 MG tablet Take 1 tablet (5 mg total) by mouth every evening. 30 tablet 0   PREBIOTIC PRODUCT PO Take by mouth. Mix in shake and drink once a day.     promethazine (PHENERGAN) 25 MG tablet Take 1 tablet (25 mg total) by mouth every 8 (eight) hours as needed for nausea or vomiting. 20 tablet 0   silver sulfADIAZINE (SILVADENE) 1 % cream Apply 1 application topically daily. 50 g 0   thyroid (NP THYROID) 15 MG tablet Take 3 tablets (45 mg total) by mouth daily. 90 tablet 0   tizanidine (ZANAFLEX) 2 MG capsule Take 1 capsule (2 mg total) by mouth 3 (three) times daily. 30 capsule 1   Vitamin D, Ergocalciferol, (DRISDOL) 1.25 MG (50000 UNIT) CAPS capsule Take 1 capsule by mouth once a week 4 capsule 0   ALPRAZolam (XANAX) 0.5 MG tablet TAKE ONE TABLET EVERY MORNING, TAKE ONE TABLET DAILY AT NOON, TAKE ONE TABLET EVERY EVENING AND TAKE ONE TABLET DAILY AT BEDTIME 120 tablet 3   DULoxetine (CYMBALTA) 30 MG capsule Take 1 capsule (30 mg total) by mouth daily. 90 capsule 1   No current facility-administered medications for this visit.    Medication Side Effects: None  Allergies:  Allergies  Allergen Reactions   Ceftriaxone Sodium In Dextrose Other (See Comments)    "heart races and cannot breathe"   Guaifenesin & Derivatives Other (See Comments)    "heart races,  dizziness, light-headedness"   Meperidine Other (See Comments)    "heart races"   Other Rash   Penicillins Hives   Pseudoephedrine Other (See Comments)    "heart races and high blood pressure"   Rosuvastatin Calcium Nausea And Vomiting    Muscle aches    Simvastatin Other (See Comments)    muscle aching    Ciprofloxacin    Rocephin [Ceftriaxone] Itching    Past Medical History:  Diagnosis Date   Anxiety    High cholesterol    Hypertension    Lyme disease    Thyroid disease     Family History  Problem Relation Age of Onset   Hypertension Mother    High Cholesterol Mother    Heart disease Father    High Cholesterol Father    Hypertension Sister    High Cholesterol Sister    Hypertension Brother    High Cholesterol Brother    Colon cancer Neg Hx     Social History   Socioeconomic History   Marital status: Divorced    Spouse name: Not on file   Number of children: Not on file   Years of education: Not on file   Highest education level: Not on file  Occupational History   Not on file  Tobacco Use   Smoking status: Former    Types: Cigarettes    Quit date: 2006    Years since quitting: 18.2   Smokeless tobacco: Never   Tobacco comments:    socially   Media planner   Vaping Use: Never used  Substance and Sexual Activity  Alcohol use: No   Drug use: No   Sexual activity: Not Currently    Birth control/protection: Post-menopausal  Other Topics Concern   Not on file  Social History Narrative   Not on file   Social Determinants of Health   Financial Resource Strain: Not on file  Food Insecurity: Not on file  Transportation Needs: No Transportation Needs (06/18/2020)   PRAPARE - Hydrologist (Medical): No    Lack of Transportation (Non-Medical): No  Physical Activity: Not on file  Stress: Not on file  Social Connections: Not on file  Intimate Partner Violence: Not on file    Past Medical History, Surgical history, Social  history, and Family history were reviewed and updated as appropriate.   Please see review of systems for further details on the patient's review from today.   Objective:   Physical Exam:  There were no vitals taken for this visit.  Physical Exam Constitutional:      General: Jenna Arroyo is not in acute distress. Musculoskeletal:        General: No deformity.  Neurological:     Mental Status: Jenna Arroyo is alert and oriented to person, place, and time.     Cranial Nerves: No dysarthria.     Coordination: Coordination normal.  Psychiatric:        Attention and Perception: Perception normal. Jenna Arroyo is inattentive. Jenna Arroyo does not perceive auditory or visual hallucinations.        Mood and Affect: Mood is anxious. Mood is not depressed. Affect is not labile, angry, tearful or inappropriate.        Speech: Speech normal. Speech is not slurred.        Behavior: Behavior normal. Behavior is cooperative.        Thought Content: Thought content normal. Thought content is not paranoid or delusional. Thought content does not include homicidal or suicidal ideation. Thought content does not include suicidal plan.        Cognition and Memory: Cognition and memory normal.        Judgment: Judgment normal.     Comments: Insight fair.   chronically low stress tolerance. Complain of chronic forgetfulness      Lab Review:     Component Value Date/Time   NA 138 07/17/2021 0946   K 4.6 07/17/2021 0946   CL 97 07/17/2021 0946   CO2 35 (H) 07/17/2021 0946   GLUCOSE 80 07/17/2021 0946   BUN 19 07/17/2021 0946   CREATININE 0.95 07/17/2021 0946   CREATININE 0.85 04/05/2020 1052   CALCIUM 9.5 07/17/2021 0946   PROT 6.6 07/17/2021 0946   ALBUMIN 4.2 07/17/2021 0946   AST 13 07/17/2021 0946   ALT 16 07/17/2021 0946   ALKPHOS 91 07/17/2021 0946   BILITOT 0.4 07/17/2021 0946   GFRNONAA >60 02/01/2020 1513   GFRAA >60 02/01/2020 1513       Component Value Date/Time   WBC 7.6 07/17/2021 0946   RBC 4.35  07/17/2021 0946   HGB 13.2 07/17/2021 0946   HCT 39.6 07/17/2021 0946   PLT 328.0 07/17/2021 0946   MCV 90.9 07/17/2021 0946   MCH 30.3 04/05/2020 1052   MCHC 33.3 07/17/2021 0946   RDW 13.2 07/17/2021 0946   LYMPHSABS 1.7 07/17/2021 0946   MONOABS 0.4 07/17/2021 0946   EOSABS 0.2 07/17/2021 0946   BASOSABS 0.1 07/17/2021 0946    No results found for: "POCLITH", "LITHIUM"   No results found for: "PHENYTOIN", "PHENOBARB", "VALPROATE", "CBMZ"   .  res Assessment: Plan:   Generalized anxiety disorder, manageable  Attention deficit hyperactivity disorder improved with duloxetine  History of panic attacks in remission with current meds  Chronic family conflict ongoing.   Patient has been under our care for many years.  Jenna Arroyo has some chronic instability and anxiety and function but does generally get benefit from the medication.  Jenna Arroyo has not been able to reduce the dosage of alprazolam which Jenna Arroyo has taken for many years due to excessive anxiety.  Chronic problems and conflicts with parents and sister and brother and other people  Continue Cymbalta 30 mg daily of duloxetine for chronic anxiety.    Jenna Arroyo failed attempted transition from alprazolam to lorazepam in October 2020.  It appears Jenna Arroyo had some withdrawal symptoms and Jenna Arroyo is gone back to alprazolam.  It was discussed with Jenna Arroyo that we could do the transition more gradually if Jenna Arroyo wanted to pursue that to see if Jenna Arroyo can get cognitive benefit with the switch.  Jenna Arroyo does not want to do that at this time.  Anxiety interferes with cognition too. Continue Xanax 0.5 mg QID, Jenna Arroyo's been able to limit to 3 daily.  Jenna Arroyo can continue to try hydroxyzine 25 to 50 mg every 8 hours as needed anxiety.  However if it does not help then do not take it.  Discussed side effect risks.. Jenna Arroyo doesn't want more meds.  No med changes today.  Again extensively We discussed the short-term risks associated with benzodiazepines including sedation and increased fall  risk among others.  Discussed long-term side effect risk including dependence, potential withdrawal symptoms, and the potential eventual dose-related risk of dementia.  But recent studies from 2020 dispute this association between benzodiazepines and dementia risk. Newer studies in 2020 do not support an association with dementia.   Has started counseling at [facility] Supportive and problem solving techniques with family conflict with sister and son and grief with mother's death still a problem.  Mother protected Jenna Arroyo with other family members.   Educated about Jenna Arroyo father's recent dx of dementia and what it means to have the dx. Supportive therapy dealing with family conflict.  "Broken heart with my son. "   Has apologized but won't be forgiven.  He has cut ties with Jenna Arroyo.  FU 3-4 mos  Lynder Parents, MD, DFAPA   Please see After Visit Summary for patient specific instructions.  No future appointments.     No orders of the defined types were placed in this encounter.     -------------------------------

## 2022-10-08 ENCOUNTER — Telehealth: Payer: Self-pay | Admitting: Family Medicine

## 2022-10-08 DIAGNOSIS — I10 Essential (primary) hypertension: Secondary | ICD-10-CM

## 2022-10-08 MED ORDER — IRBESARTAN-HYDROCHLOROTHIAZIDE 150-12.5 MG PO TABS: 2.0000 | ORAL_TABLET | Freq: Every day | ORAL | 0 refills | Status: DC

## 2022-10-08 NOTE — Addendum Note (Signed)
Addended byDamita Dunnings D on: 10/08/2022 04:45 PM   Modules accepted: Orders

## 2022-10-08 NOTE — Telephone Encounter (Signed)
Prescription Request  10/08/2022  Is this a "Controlled Substance" medicine? No  LOV: Visit date not found  What is the name of the medication or equipment?  irbesartan-hydrochlorothiazide (AVALIDE) 150-12.5 MG tablet   Have you contacted your pharmacy to request a refill? Yes   Which pharmacy would you like this sent to?  [facility] [place], [place] - [address] [place] [address] Phone: [phone] Fax: [phone]    Patient notified that their request is being sent to the clinical staff for review and that they should receive a response within 2 business days.   Please advise at Mobile [phone] (mobile)

## 2022-10-08 NOTE — Telephone Encounter (Signed)
15 day supply only, last OV 07/2021 w/ PCP. Needs appt for further refills/quantity.

## 2022-10-15 ENCOUNTER — Telehealth: Payer: Self-pay | Admitting: Psychiatry

## 2022-10-15 DIAGNOSIS — F419 Anxiety disorder, unspecified: Secondary | ICD-10-CM

## 2022-10-15 MED ORDER — HYDROXYZINE PAMOATE 25 MG PO CAPS: 25.0000 mg | ORAL_CAPSULE | Freq: Three times a day (TID) | ORAL | 1 refills | Status: DC | PRN

## 2022-10-15 NOTE — Telephone Encounter (Signed)
Sent!

## 2022-10-15 NOTE — Telephone Encounter (Signed)
Pt left v-mail requesting Rx for Hydroxyzine to take edge off anxiety. Doesn't want to go up on controlled meds. [facility]. Apt 7/29. Last apt 3/28 Contact pt @ [phone]

## 2022-11-05 ENCOUNTER — Telehealth: Payer: Self-pay | Admitting: Family Medicine

## 2022-11-05 DIAGNOSIS — I10 Essential (primary) hypertension: Secondary | ICD-10-CM

## 2022-11-05 MED ORDER — IRBESARTAN-HYDROCHLOROTHIAZIDE 150-12.5 MG PO TABS: 2.0000 | ORAL_TABLET | Freq: Every day | ORAL | 0 refills | Status: DC

## 2022-11-05 NOTE — Addendum Note (Signed)
Addended by: Roxanne Gates on: 11/05/2022 02:25 PM   Modules accepted: Orders

## 2022-11-05 NOTE — Telephone Encounter (Signed)
Pt is currently waiting for insurance forms to come I for her to be seen. Pt is requesting a supply to get her to this point.   Prescription Request  11/05/2022  Is this a "Controlled Substance" medicine? No  LOV: Visit date not found  What is the name of the medication or equipment?   irbesartan-hydrochlorothiazide (AVALIDE) 150-12.5 MG tablet [[id number]]   Have you contacted your pharmacy to request a refill? No   Which pharmacy would you like this sent to?  [facility] [address] [place], [place] - [address] [place] [address] Phone: [phone] Fax: [phone]   Patient notified that their request is being sent to the clinical staff for review and that they should receive a response within 2 business days.   Please advise at Mobile [phone] (mobile)

## 2022-11-05 NOTE — Telephone Encounter (Signed)
Refill sent.

## 2022-11-18 ENCOUNTER — Ambulatory Visit (INDEPENDENT_AMBULATORY_CARE_PROVIDER_SITE_OTHER): Payer: Medicaid Other | Admitting: Nurse Practitioner

## 2022-11-18 ENCOUNTER — Telehealth: Payer: Self-pay

## 2022-11-18 ENCOUNTER — Encounter: Payer: Self-pay | Admitting: Nurse Practitioner

## 2022-11-18 VITALS — BP 118/86 | HR 84 | Temp 98.0°F | Ht 66.0 in | Wt 200.6 lb

## 2022-11-18 DIAGNOSIS — E039 Hypothyroidism, unspecified: Secondary | ICD-10-CM | POA: Diagnosis not present

## 2022-11-18 DIAGNOSIS — J029 Acute pharyngitis, unspecified: Secondary | ICD-10-CM | POA: Diagnosis not present

## 2022-11-18 DIAGNOSIS — L304 Erythema intertrigo: Secondary | ICD-10-CM

## 2022-11-18 DIAGNOSIS — E559 Vitamin D deficiency, unspecified: Secondary | ICD-10-CM | POA: Diagnosis not present

## 2022-11-18 LAB — POC COVID19 BINAXNOW: SARS Coronavirus 2 Ag: NEGATIVE

## 2022-11-18 LAB — POCT INFLUENZA A/B
Influenza A, POC: NEGATIVE
Influenza B, POC: NEGATIVE

## 2022-11-18 MED ORDER — NYSTATIN 100000 UNIT/GM EX CREA: 1.0000 | TOPICAL_CREAM | Freq: Two times a day (BID) | CUTANEOUS | 1 refills | Status: AC

## 2022-11-18 MED ORDER — PROMETHAZINE HCL 25 MG PO TABS: 25.0000 mg | ORAL_TABLET | Freq: Three times a day (TID) | ORAL | 0 refills | Status: DC | PRN

## 2022-11-18 MED ORDER — ONDANSETRON HCL 4 MG PO TABS: 4.0000 mg | ORAL_TABLET | Freq: Three times a day (TID) | ORAL | 0 refills | Status: DC | PRN

## 2022-11-18 NOTE — Assessment & Plan Note (Addendum)
With fatigue, will check TSH today. Continue NP thyroid 45mg  daily and adjust regimen based on results. She states that she follows with integrative medicine as well.

## 2022-11-18 NOTE — Telephone Encounter (Signed)
I called patient and she said that Zofran she has taken before and did not help and caused headaches. She would like phenergan to be called in please.

## 2022-11-18 NOTE — Progress Notes (Signed)
Acute Office Visit  Subjective:     Patient ID: Jenna Arroyo, female    DOB: 1965/11/21, 57 y.o.   MRN: [id number]  Chief Complaint  Patient presents with   Diarrhea    Since Monday, feeling weak and tired, scratchy throat   HPI:  Patient is in today for diarrhea, fatigue, and a scratchy throat for 3 days.   UPPER RESPIRATORY TRACT INFECTION  Fever: no - however felt cold and warm at times Cough: no Shortness of breath: no Wheezing: no Chest pain: no Chest tightness: no Chest congestion: no Nasal congestion: no Runny nose: no Post nasal drip: yes Sneezing: yes Sore throat: yes - scratchy Swollen glands: yes Sinus pressure: no Headache: yes Face pain: no Toothache: no Ear pain: no bilateral Ear pressure: no bilateral Eyes red/itching:no Eye drainage/crusting: no  Vomiting: no - nausea Rash: yes under axilla Fatigue: yes Sick contacts: no Strep contacts: no  Context: stable Recurrent sinusitis: no Relief with OTC cold/cough medications:  some   Treatments attempted: ibuprofen, tylenol   ROS See pertinent positives and negatives per HPI.     Objective:    BP 118/86 (BP Location: Left Arm)   Pulse 84   Temp 98 F (36.7 C)   Ht 5\' 6"  (1.676 m)   Wt 200 lb 9.6 oz (91 kg)   SpO2 99%   BMI 32.38 kg/m    Physical Exam Vitals and nursing note reviewed.  Constitutional:      General: She is not in acute distress.    Appearance: Normal appearance.  HENT:     Head: Normocephalic.     Right Ear: Tympanic membrane, ear canal and external ear normal.     Left Ear: Tympanic membrane, ear canal and external ear normal.     Nose:     Right Sinus: No maxillary sinus tenderness or frontal sinus tenderness.     Left Sinus: No maxillary sinus tenderness or frontal sinus tenderness.     Mouth/Throat:     Pharynx: No oropharyngeal exudate or posterior oropharyngeal erythema.  Eyes:     Conjunctiva/sclera: Conjunctivae normal.  Cardiovascular:     Rate and  Rhythm: Normal rate and regular rhythm.     Pulses: Normal pulses.     Heart sounds: Normal heart sounds.  Pulmonary:     Effort: Pulmonary effort is normal.     Breath sounds: Normal breath sounds.  Musculoskeletal:     Cervical back: Normal range of motion and neck supple. No tenderness.  Lymphadenopathy:     Cervical: No cervical adenopathy.  Skin:    General: Skin is warm.     Findings: Rash (intertrigo under bilateral axilla) present.  Neurological:     General: No focal deficit present.     Mental Status: She is alert and oriented to person, place, and time.  Psychiatric:        Mood and Affect: Mood normal.        Behavior: Behavior normal.        Thought Content: Thought content normal.        Judgment: Judgment normal.     Results for orders placed or performed in visit on 11/18/22  POCT Influenza A/B  Result Value Ref Range   Influenza A, POC Negative Negative   Influenza B, POC Negative Negative  POC COVID-19 BinaxNow  Result Value Ref Range   SARS Coronavirus 2 Ag Negative Negative        Assessment & Plan:  Problem List Items Addressed This Visit       Endocrine   Hypothyroidism    With fatigue, will check TSH today. Continue NP thyroid 45mg  daily and adjust regimen based on results. She states that she follows with integrative medicine as well.       Relevant Orders   TSH   Other Visit Diagnoses     Sore throat    -  Primary   POC covid and flu negative. Most likely viral. Continue tylenol and ibuprofen prn. Can gargle with warm salt water. Encourage fluids, rest. F/U if not improving   Relevant Orders   POCT Influenza A/B (Completed)   POC COVID-19 BinaxNow (Completed)   CBC   Comprehensive metabolic panel   Vitamin D deficiency       With muscle cramps and diarrhea and taking supplements, will check vitamin D levels today to make sure she isn't overcorrected.   Relevant Orders   VITAMIN D 25 Hydroxy (Vit-D Deficiency, Fractures)    Intertrigo       Start nystatin cream under axilla BID. Keep area clean and dry. F/U if not improving.       Meds ordered this encounter  Medications   nystatin cream (MYCOSTATIN)    Sig: Apply 1 Application topically 2 (two) times daily.    Dispense:  30 g    Refill:  1    Return if symptoms worsen or fail to improve.  Gerre Scull, NP

## 2022-11-18 NOTE — Telephone Encounter (Signed)
Patient returned called and asked if she would need something for her nausea. I told her I would send message to provider   Pharmacy [facility] [address] in [facility]

## 2022-11-18 NOTE — Patient Instructions (Addendum)
It was great to see you!  We are checking your labs today and will let you know the results via mychart/phone.   Keep drinking plenty of fluids, getting rest. Can keep taking tylenol and ibuprofen as needed.   You can use warm compresses under your arms to help with the swelling.   Start nystatin cream under both armpits to help with the rash twice a day.   Let's follow-up if your symptoms worsen or don't improve.   Take care,  Rodman Pickle, NP

## 2022-11-18 NOTE — Telephone Encounter (Signed)
Patient had called while I was online with a patient and said that she was nauseated. I returned patient's called and LVM to return call.

## 2022-11-19 LAB — COMPREHENSIVE METABOLIC PANEL
ALT: 37 U/L — ABNORMAL HIGH (ref 0–35)
AST: 26 U/L (ref 0–37)
Albumin: 4.1 g/dL (ref 3.5–5.2)
Alkaline Phosphatase: 84 U/L (ref 39–117)
BUN: 17 mg/dL (ref 6–23)
CO2: 30 mEq/L (ref 19–32)
Calcium: 9.4 mg/dL (ref 8.4–10.5)
Chloride: 95 mEq/L — ABNORMAL LOW (ref 96–112)
Creatinine, Ser: 0.96 mg/dL (ref 0.40–1.20)
GFR: 65.79 mL/min (ref >60.00)
Glucose, Bld: 81 mg/dL (ref 70–99)
Potassium: 4.6 mEq/L (ref 3.5–5.1)
Sodium: 135 mEq/L (ref 135–145)
Total Bilirubin: 0.5 mg/dL (ref 0.2–1.2)
Total Protein: 6.7 g/dL (ref 6.0–8.3)

## 2022-11-19 LAB — VITAMIN D 25 HYDROXY (VIT D DEFICIENCY, FRACTURES): VITD: 40.47 ng/mL (ref 30.00–100.00)

## 2022-11-19 LAB — CBC
HCT: 41.9 % (ref 36.0–46.0)
Hemoglobin: 14.4 g/dL (ref 12.0–15.0)
MCHC: 34.4 g/dL (ref 30.0–36.0)
MCV: 90.1 fl (ref 78.0–100.0)
Platelets: 366 10*3/uL (ref 150.0–400.0)
RBC: 4.65 Mil/uL (ref 3.87–5.11)
RDW: 13.3 % (ref 11.5–15.5)
WBC: 8.6 10*3/uL (ref 4.0–10.5)

## 2022-11-19 LAB — TSH: TSH: 2.48 u[IU]/mL (ref 0.35–5.50)

## 2022-12-02 ENCOUNTER — Telehealth: Payer: Self-pay | Admitting: Family Medicine

## 2022-12-02 MED ORDER — THYROID 15 MG PO TABS: 45.0000 mg | ORAL_TABLET | Freq: Every day | ORAL | 1 refills | Status: DC

## 2022-12-02 NOTE — Telephone Encounter (Signed)
Prescription Request  12/02/2022  Is this a "Controlled Substance" medicine? No  LOV: Visit date not found  What is the name of the medication or equipment?   thyroid (NP THYROID) 15 MG tablet [[id number]]   Have you contacted your pharmacy to request a refill? No   Which pharmacy would you like this sent to?   [facility] [address] [place], [place] - [address] [place] [address] Phone: [phone] Fax: [phone]  Patient notified that their request is being sent to the clinical staff for review and that they should receive a response within 2 business days.   Please advise at Mobile [phone] (mobile)

## 2022-12-02 NOTE — Addendum Note (Signed)
Addended by: Roxanne Gates on: 12/02/2022 09:57 AM   Modules accepted: Orders

## 2022-12-02 NOTE — Telephone Encounter (Signed)
Refill sent.

## 2022-12-07 ENCOUNTER — Telehealth: Payer: Self-pay

## 2022-12-07 NOTE — Telephone Encounter (Signed)
Left message for patient to call office appointment scheduled for 12/08/2022 at 6:20pm and patient needs lab she needs to arrive at office at about 6 pm.

## 2022-12-08 ENCOUNTER — Ambulatory Visit: Payer: Medicaid Other | Admitting: Family Medicine

## 2022-12-08 ENCOUNTER — Encounter: Payer: Self-pay | Admitting: Family Medicine

## 2022-12-08 VITALS — BP 122/78 | HR 83 | Temp 98.3°F | Resp 12 | Ht 66.0 in

## 2022-12-08 DIAGNOSIS — G8929 Other chronic pain: Secondary | ICD-10-CM | POA: Diagnosis not present

## 2022-12-08 DIAGNOSIS — M542 Cervicalgia: Secondary | ICD-10-CM

## 2022-12-08 DIAGNOSIS — E039 Hypothyroidism, unspecified: Secondary | ICD-10-CM | POA: Diagnosis not present

## 2022-12-08 DIAGNOSIS — I10 Essential (primary) hypertension: Secondary | ICD-10-CM

## 2022-12-08 DIAGNOSIS — M546 Pain in thoracic spine: Secondary | ICD-10-CM

## 2022-12-08 DIAGNOSIS — E538 Deficiency of other specified B group vitamins: Secondary | ICD-10-CM | POA: Insufficient documentation

## 2022-12-08 DIAGNOSIS — E559 Vitamin D deficiency, unspecified: Secondary | ICD-10-CM | POA: Diagnosis not present

## 2022-12-08 DIAGNOSIS — B351 Tinea unguium: Secondary | ICD-10-CM

## 2022-12-08 DIAGNOSIS — R5383 Other fatigue: Secondary | ICD-10-CM

## 2022-12-08 DIAGNOSIS — L304 Erythema intertrigo: Secondary | ICD-10-CM | POA: Insufficient documentation

## 2022-12-08 MED ORDER — MELOXICAM 7.5 MG PO TABS
7.5000 mg | ORAL_TABLET | Freq: Every day | ORAL | 0 refills | Status: DC
Start: 2022-12-08 — End: 2023-06-01

## 2022-12-08 MED ORDER — CICLOPIROX 8 % EX SOLN
Freq: Every day | CUTANEOUS | 0 refills | Status: AC
Start: 2022-12-08 — End: ?

## 2022-12-08 MED ORDER — CYANOCOBALAMIN 1000 MCG/ML IJ SOLN
1000.0000 ug | Freq: Once | INTRAMUSCULAR | Status: AC
Start: 2022-12-08 — End: 2022-12-08
  Administered 2022-12-08: 1000 ug via INTRAMUSCULAR

## 2022-12-08 MED ORDER — CYANOCOBALAMIN 1000 MCG/ML IJ SOLN
1000.0000 ug | Freq: Once | INTRAMUSCULAR | Status: DC
Start: 2022-12-08 — End: 2022-12-08

## 2022-12-08 NOTE — Assessment & Plan Note (Signed)
B12 today Return to office for labs

## 2022-12-08 NOTE — Assessment & Plan Note (Signed)
Con't with nystatin

## 2022-12-08 NOTE — Assessment & Plan Note (Signed)
X 1 year  Xrays  PT  Meloxicam 7.5 mg daily

## 2022-12-08 NOTE — Progress Notes (Signed)
Established Patient Office Visit  Subjective   Patient ID: Jenna Arroyo, female    DOB: [birth year]  Age: 57 y.o. MRN: [id number]  Chief Complaint  Patient presents with   follow up PT    discuss lab work from 11/18/22    HPI Discussed the use of AI scribe software for clinical note transcription with the patient, who gave verbal consent to proceed.  History of Present Illness   The patient, with a history of migraines, presents with upper and lower back pain that has been ongoing for about a year and a half. The pain began after a fall down the stairs while wearing flip flops. The patient did not seek immediate medical attention as the pain was not present at the time of the fall. The pain is located in the upper back, between the shoulder blades, and in the lower back. It does not radiate down the legs and has been constant recently, which the patient attributes to sleeping in a softer bed. The patient has not been taking regular pain medication, occasionally using Advil, but tries to avoid it due to concerns about overuse.  In addition to the back pain, the patient also reports issues with their toenails, suspecting either athlete's foot or a chemical burn from a cleaning product. The patient has been experiencing dry skin on their feet and under their arms, with swollen glands under the arms. The patient has been using nystatin cream for the underarm issue, but it has not been effective for the feet.  The patient also reports fatigue and has been taking B12 drops, but feels they are not being absorbed effectively. The patient requests a B12 shot to address this issue.      Patient Active Problem List   Diagnosis Date Noted   Cervical spine pain 12/08/2022   Chronic midline thoracic back pain 12/08/2022   Onychomycosis of great toe 12/08/2022   B12 deficiency 12/08/2022   Cervical strain 02/18/2021   Dyspepsia 02/18/2021   Urinary frequency 12/06/2020   Hyponatremia 09/23/2020    Anemia 06/14/2020   Nausea 04/05/2020   History of panic attacks 01/23/2019   GAD (generalized anxiety disorder) 04/22/2018   ADD (attention deficit disorder) 04/22/2018   Pharyngitis 12/11/2016   Axillary lymphadenopathy 12/11/2016   Lyme disease 12/11/2016   Dyslipidemia 06/18/2016   Essential hypertension 06/18/2016   Hypothyroidism 06/18/2016   Past Medical History:  Diagnosis Date   Anxiety    High cholesterol    Hypertension    Lyme disease    Thyroid disease    Past Surgical History:  Procedure Laterality Date   ABDOMINAL EXPLORATION SURGERY     APPENDECTOMY     MASTOIDECTOMY     TONSILECTOMY/ADENOIDECTOMY WITH MYRINGOTOMY     Social History   Tobacco Use   Smoking status: Former    Types: Cigarettes    Quit date: 2006    Years since quitting: 18.4   Smokeless tobacco: Never   Tobacco comments:    socially   Building services engineer Use: Never used  Substance Use Topics   Alcohol use: No   Drug use: No   Social History   Socioeconomic History   Marital status: Divorced    Spouse name: Not on file   Number of children: Not on file   Years of education: Not on file   Highest education level: Not on file  Occupational History   Not on file  Tobacco Use   Smoking status:  Former    Types: Cigarettes    Quit date: 2006    Years since quitting: 18.4   Smokeless tobacco: Never   Tobacco comments:    socially   Building services engineer Use: Never used  Substance and Sexual Activity   Alcohol use: No   Drug use: No   Sexual activity: Not Currently    Birth control/protection: Post-menopausal  Other Topics Concern   Not on file  Social History Narrative   Not on file   Social Determinants of Health   Financial Resource Strain: Not on file  Food Insecurity: Not on file  Transportation Needs: No Transportation Needs (06/18/2020)   PRAPARE - Administrator, Civil Service (Medical): No    Lack of Transportation (Non-Medical): No  Physical  Activity: Not on file  Stress: Not on file  Social Connections: Not on file  Intimate Partner Violence: Not on file   Family Status  Relation Name Status   Mother Asencion Islam Alive   Father Mr. Threasa Beards   Sister [address]   Brother Billy,49 Alive   Son Jordan,29 Alive   Neg Hx  (Not Specified)   Family History  Problem Relation Age of Onset   Hypertension Mother    High Cholesterol Mother    Heart disease Father    High Cholesterol Father    Hypertension Sister    High Cholesterol Sister    Hypertension Brother    High Cholesterol Brother    Colon cancer Neg Hx    Allergies  Allergen Reactions   Ceftriaxone Sodium In Dextrose Other (See Comments)    "heart races and cannot breathe"   Guaifenesin & Derivatives Other (See Comments)    "heart races, dizziness, light-headedness"   Meperidine Other (See Comments)    "heart races"   Other Rash   Penicillins Hives   Pseudoephedrine Other (See Comments)    "heart races and high blood pressure"   Rosuvastatin Calcium Nausea And Vomiting    Muscle aches    Simvastatin Other (See Comments)    muscle aching    Ciprofloxacin    Rocephin [Ceftriaxone] Itching      Review of Systems  Constitutional:  Positive for malaise/fatigue. Negative for fever.  HENT:  Negative for congestion.   Eyes:  Negative for blurred vision.  Respiratory:  Negative for cough and shortness of breath.   Cardiovascular:  Negative for chest pain, palpitations and leg swelling.  Gastrointestinal:  Negative for vomiting.  Musculoskeletal:  Negative for back pain.  Skin:  Positive for itching and rash.  Neurological:  Negative for loss of consciousness and headaches.      Objective:     BP 122/78 (BP Location: Right Arm, Cuff Size: Large)   Pulse 83   Temp 98.3 F (36.8 C) (Oral)   Resp 12   Ht 5\' 6"  (1.676 m)   SpO2 98%   BMI 32.38 kg/m  BP Readings from Last 3 Encounters:  12/08/22 122/78  11/18/22 118/86  06/26/22 134/82   Wt  Readings from Last 3 Encounters:  11/18/22 200 lb 9.6 oz (91 kg)  06/26/22 199 lb (90.3 kg)  09/05/21 210 lb (95.3 kg)   SpO2 Readings from Last 3 Encounters:  12/08/22 98%  11/18/22 99%  06/26/22 99%      Physical Exam Vitals and nursing note reviewed.  Constitutional:      Appearance: Normal appearance. She is well-developed. She is obese. She is not ill-appearing.  HENT:  Head: Normocephalic and atraumatic.  Eyes:     Conjunctiva/sclera: Conjunctivae normal.  Neck:     Thyroid: No thyromegaly.     Vascular: No carotid bruit or JVD.  Cardiovascular:     Rate and Rhythm: Normal rate and regular rhythm.     Heart sounds: Normal heart sounds. No murmur heard. Pulmonary:     Effort: Pulmonary effort is normal. No respiratory distress.     Breath sounds: Normal breath sounds. No wheezing or rales.  Chest:     Chest wall: No tenderness.  Abdominal:     General: There is no distension.     Palpations: Abdomen is soft.     Tenderness: There is no abdominal tenderness. There is no guarding or rebound.  Musculoskeletal:        General: Tenderness and signs of injury present. No swelling or deformity. Normal range of motion.     Cervical back: Normal range of motion and neck supple.  Skin:    Findings: Rash present.  Neurological:     General: No focal deficit present.     Mental Status: She is alert and oriented to person, place, and time.   Physical Exam            No results found for any visits on 12/08/22.  Last CBC Lab Results  Component Value Date   WBC 8.6 11/18/2022   HGB 14.4 11/18/2022   HCT 41.9 11/18/2022   MCV 90.1 11/18/2022   MCH 30.3 04/05/2020   RDW 13.3 11/18/2022   PLT 366.0 11/18/2022   Last metabolic panel Lab Results  Component Value Date   GLUCOSE 81 11/18/2022   NA 135 11/18/2022   K 4.6 11/18/2022   CL 95 (L) 11/18/2022   CO2 30 11/18/2022   BUN 17 11/18/2022   CREATININE 0.96 11/18/2022   GFRNONAA >60 02/01/2020   CALCIUM  9.4 11/18/2022   PROT 6.7 11/18/2022   ALBUMIN 4.1 11/18/2022   BILITOT 0.5 11/18/2022   ALKPHOS 84 11/18/2022   AST 26 11/18/2022   ALT 37 (H) 11/18/2022   ANIONGAP 11 02/01/2020   Last lipids Lab Results  Component Value Date   CHOL 292 (H) 10/15/2020   HDL 66.80 10/15/2020   LDLCALC 189 (H) 10/15/2020   TRIG 181.0 (H) 10/15/2020   CHOLHDL 4 10/15/2020   Last hemoglobin A1c No results found for: "HGBA1C" Last thyroid functions Lab Results  Component Value Date   TSH 2.48 11/18/2022   T4TOTAL 6.5 10/15/2020   Last vitamin D Lab Results  Component Value Date   VD25OH 40.47 11/18/2022   Last vitamin B12 and Folate Lab Results  Component Value Date   VITAMINB12 484 03/04/2021      The 10-year ASCVD risk score (Arnett DK, et al., 2019) is: 8.1%    Assessment & Plan:   Problem List Items Addressed This Visit       Unprioritized   Hypothyroidism   Onychomycosis of great toe    Penlac  Consider podiatry       Relevant Medications   ciclopirox (PENLAC) 8 % solution   Chronic midline thoracic back pain    Xray  PT  Meloxicam                  Relevant Medications   meloxicam (MOBIC) 7.5 MG tablet   Other Relevant Orders   DG Lumbar Spine Complete   DG Thoracic Spine 2 View   Ambulatory referral to Physical Therapy  Cervical spine pain - Primary    X 1 year  Xrays  PT  Meloxicam 7.5 mg daily       Relevant Medications   meloxicam (MOBIC) 7.5 MG tablet   Other Relevant Orders   DG Lumbar Spine Complete   DG Thoracic Spine 2 View   B12 deficiency    B12 today Return to office for labs       Relevant Medications   cyanocobalamin (VITAMIN B12) injection 1,000 mcg (Start on 12/08/2022  7:00 PM)   Other Relevant Orders   Vitamin B12   Other Visit Diagnoses     Other fatigue       Relevant Orders   CBC with Differential/Platelet   Comprehensive metabolic panel   Lipid panel   TSH   TSH   Vitamin B12   VITAMIN D 25 Hydroxy (Vit-D  Deficiency, Fractures)   Vitamin D deficiency       Relevant Orders   VITAMIN D 25 Hydroxy (Vit-D Deficiency, Fractures)   Intertrigo       Primary hypertension       Relevant Orders   Lipid panel   TSH     Assessment and Plan    Back Pain: Chronic upper and lower back pain, exacerbated by a fall down the stairs a year and a half ago. No radicular symptoms or weakness. Pain is constant and possibly exacerbated by a softer bed. No recent imaging. -Order X-rays of the upper and lower back. -Refer to physical therapy across the hall. -Prescribe Meloxicam for anti-inflammatory effect.  Onychomycosis: Suspected fungal infection of the toenails, possibly secondary to a chemical burn from [employer]. [employer]. Nails and surrounding skin are affected. -Prescribe Penlac to be applied to the affected nails and surrounding skin for 7 days, then removed with alcohol and the cycle repeated.  Fatigue: Reports of chronic fatigue and poor sleep. Currently taking B12 drops but reports no improvement. -Administer B12 injection today. -Plan to check B12 levels at next lab visit.  General Health Maintenance / Followup Plans: -Return for follow-up and labs at a later date. -Consider referral to a podiatrist if no improvement in toenail condition with Penlac treatment. -Consider further imaging or referral to orthopedics if no improvement in back pain with physical therapy and Meloxicam.        No follow-ups on file.    Donato Schultz, DO

## 2022-12-08 NOTE — Assessment & Plan Note (Signed)
Xray  PT  Meloxicam

## 2022-12-08 NOTE — Assessment & Plan Note (Addendum)
Lab Results  Component Value Date   TSH 2.48 11/18/2022  Con't thyroid meds

## 2022-12-08 NOTE — Assessment & Plan Note (Signed)
Penlac  Consider podiatry

## 2022-12-11 ENCOUNTER — Telehealth: Payer: Self-pay | Admitting: Family Medicine

## 2022-12-11 DIAGNOSIS — I10 Essential (primary) hypertension: Secondary | ICD-10-CM

## 2022-12-11 MED ORDER — IRBESARTAN-HYDROCHLOROTHIAZIDE 150-12.5 MG PO TABS
2.0000 | ORAL_TABLET | Freq: Every day | ORAL | 1 refills | Status: DC
Start: 2022-12-11 — End: 2023-09-08

## 2022-12-11 NOTE — Telephone Encounter (Signed)
Left detailed message machine that rx has been sent in.

## 2022-12-11 NOTE — Addendum Note (Signed)
Addended by: Thelma Barge D on: 12/11/2022 03:44 PM   Modules accepted: Orders

## 2022-12-11 NOTE — Telephone Encounter (Signed)
Prescription Request  12/11/2022  Is this a "Controlled Substance" medicine? No  LOV: 12/08/2022  What is the name of the medication or equipment? irbesartan-hydrochlorothiazide (AVALIDE) 150-12.5 MG tablet   Have you contacted your pharmacy to request a refill? No   Which pharmacy would you like this sent to?  [facility] [address] [place], [place] - [address] [place] [address] Phone: [phone] Fax: [phone]   Patient notified that their request is being sent to the clinical staff for review and that they should receive a response within 2 business days.   Please advise at Mobile [phone] (mobile)

## 2022-12-14 NOTE — Telephone Encounter (Signed)
Left message on machine that we sent it.  Check with pharmacy and pt picked up rx on Saturday.

## 2022-12-14 NOTE — Telephone Encounter (Signed)
Initial Comment (chart remake) Caller states she is calling because she is returning the office. She got a call stating that there was a call and they have not seen her medication. She had her blood pressure go up to 160. Translation No Nurse Assessment Nurse: Maisie Fus, RN, [facility] Date/Time Jenna Arroyo Time): 12/11/2022 7:08:10 PM Confirm and document reason for call. If symptomatic, describe symptoms. ---Caller states that she got a call from the office that an Rx was sent to [facility] and she would like it to be sent to [facility] in [place]. Medication is for irbesartan HTCZ 150. Denies any current Sx. Does the patient have any new or worsening symptoms? ---No Disp. Time Jenna Arroyo Time) Disposition Final User 12/11/2022 7:06:57 PM Send To RN Personal Gwen Pounds 12/11/2022 7:11:11 PM Pharmacy Call Maisie Fus, RN, [facility] Reason: Pharmacy was closed and RN could not call in refill for pt. Will advise to call tomorrow when pharmacy is open. 12/11/2022 7:14:57 PM Clinical Call Yes Maisie Fus RN, [facility] Final Disposition 12/11/2022 7:14:57 PM Clinical Call Yes Maisie Fus, RN, [facility] Comments User: Silvestre Gunner, RN Date/Time Jenna Arroyo Time): 12/11/2022 7:14:17 PM PLEASE NOTE: All timestamps contained within this report are represented as [place] Standard Time. CONFIDENTIALTY NOTICE: This fax transmission is intended only for the addressee. It contains information that is legally privileged, confidential or otherwise protected from use or disclosure. If you are not the intended recipient, you are strictly prohibited from reviewing, disclosing, copying using or disseminating any of this information or taking any action in reliance on or regarding this information. If you have received this fax in error, please notify us immediately by telephone so that we can arrange for its return to [place]. Phone: [phone], Toll-Free: [phone], Fax: [phone] Page: 2 of 2 Call Id:  [id number] Comments LVM for pt letting her know that the pharmacy was closed and told her to call tomorrow when they are open and an RN can call in refill for sufficient amt until Monday when full Rx can be called into her correct pharmacy. Advised to also call back with Sx

## 2022-12-23 ENCOUNTER — Other Ambulatory Visit: Payer: Self-pay | Admitting: Family Medicine

## 2022-12-23 ENCOUNTER — Telehealth: Payer: Self-pay | Admitting: Family Medicine

## 2022-12-23 DIAGNOSIS — B354 Tinea corporis: Secondary | ICD-10-CM

## 2022-12-23 MED ORDER — FLUCONAZOLE 150 MG PO TABS
ORAL_TABLET | ORAL | 0 refills | Status: DC
Start: 2022-12-23 — End: 2023-06-01

## 2022-12-23 NOTE — Telephone Encounter (Signed)
Rx sent by pcp.

## 2022-12-23 NOTE — Telephone Encounter (Signed)
Pt states she is not able to take the nystatin cream because it contains Propylene glycol so she would like to know if a pill form can be prescribed instead. Stated she initially met with Rodman Pickle about this and f/u with pcp.   [facility] [address] [place], [place] - [address] [place] [address] Phone: [phone]  Fax: [phone]

## 2023-01-06 ENCOUNTER — Other Ambulatory Visit (INDEPENDENT_AMBULATORY_CARE_PROVIDER_SITE_OTHER): Payer: Medicaid Other

## 2023-01-06 DIAGNOSIS — E538 Deficiency of other specified B group vitamins: Secondary | ICD-10-CM | POA: Diagnosis not present

## 2023-01-06 DIAGNOSIS — E559 Vitamin D deficiency, unspecified: Secondary | ICD-10-CM

## 2023-01-06 DIAGNOSIS — I10 Essential (primary) hypertension: Secondary | ICD-10-CM

## 2023-01-06 DIAGNOSIS — R5383 Other fatigue: Secondary | ICD-10-CM | POA: Diagnosis not present

## 2023-01-06 LAB — COMPREHENSIVE METABOLIC PANEL
ALT: 20 U/L (ref 0–35)
AST: 15 U/L (ref 0–37)
Albumin: 4 g/dL (ref 3.5–5.2)
Alkaline Phosphatase: 88 U/L (ref 39–117)
BUN: 15 mg/dL (ref 6–23)
CO2: 31 mEq/L (ref 19–32)
Calcium: 9.6 mg/dL (ref 8.4–10.5)
Chloride: 98 mEq/L (ref 96–112)
Creatinine, Ser: 0.86 mg/dL (ref 0.40–1.20)
GFR: 75 mL/min (ref >60.00)
Glucose, Bld: 87 mg/dL (ref 70–99)
Potassium: 4.2 mEq/L (ref 3.5–5.1)
Sodium: 137 mEq/L (ref 135–145)
Total Bilirubin: 0.7 mg/dL (ref 0.2–1.2)
Total Protein: 6.6 g/dL (ref 6.0–8.3)

## 2023-01-06 LAB — CBC WITH DIFFERENTIAL/PLATELET
Basophils Absolute: 0 10*3/uL (ref 0.0–0.1)
Basophils Relative: 0.6 % (ref 0.0–3.0)
Eosinophils Absolute: 0.2 10*3/uL (ref 0.0–0.7)
Eosinophils Relative: 3 % (ref 0.0–5.0)
HCT: 39.5 % (ref 36.0–46.0)
Hemoglobin: 13.5 g/dL (ref 12.0–15.0)
Lymphocytes Relative: 27.4 % (ref 12.0–46.0)
Lymphs Abs: 2 10*3/uL (ref 0.7–4.0)
MCHC: 34.3 g/dL (ref 30.0–36.0)
MCV: 90.2 fl (ref 78.0–100.0)
Monocytes Absolute: 0.4 10*3/uL (ref 0.1–1.0)
Monocytes Relative: 4.9 % (ref 3.0–12.0)
Neutro Abs: 4.6 10*3/uL (ref 1.4–7.7)
Neutrophils Relative %: 64.1 % (ref 43.0–77.0)
Platelets: 377 10*3/uL (ref 150.0–400.0)
RBC: 4.38 Mil/uL (ref 3.87–5.11)
RDW: 13.5 % (ref 11.5–15.5)
WBC: 7.2 10*3/uL (ref 4.0–10.5)

## 2023-01-06 LAB — LIPID PANEL
Cholesterol: 283 mg/dL — ABNORMAL HIGH (ref 0–200)
HDL: 60.9 mg/dL (ref >39.00)
LDL Cholesterol: 195 mg/dL — ABNORMAL HIGH (ref 0–99)
NonHDL: 221.97
Total CHOL/HDL Ratio: 5
Triglycerides: 135 mg/dL (ref 0.0–149.0)
VLDL: 27 mg/dL (ref 0.0–40.0)

## 2023-01-06 LAB — TSH: TSH: 3.44 u[IU]/mL (ref 0.35–5.50)

## 2023-01-06 LAB — VITAMIN B12: Vitamin B-12: 733 pg/mL (ref 211–911)

## 2023-01-06 LAB — VITAMIN D 25 HYDROXY (VIT D DEFICIENCY, FRACTURES): VITD: 43.75 ng/mL (ref 30.00–100.00)

## 2023-01-11 ENCOUNTER — Telehealth: Payer: Self-pay

## 2023-01-11 NOTE — Telephone Encounter (Signed)
Chart review completed for patient. Patient is due for screening mammogram. Mychart message sent to patient to inquire about scheduling mammogram.  Joachim Carton, Population Health Specialist.  

## 2023-01-13 ENCOUNTER — Ambulatory Visit: Payer: Medicaid Other | Attending: Family Medicine | Admitting: Physical Therapy

## 2023-01-13 ENCOUNTER — Telehealth: Payer: Self-pay | Admitting: Family Medicine

## 2023-01-13 DIAGNOSIS — M546 Pain in thoracic spine: Secondary | ICD-10-CM | POA: Insufficient documentation

## 2023-01-13 DIAGNOSIS — R293 Abnormal posture: Secondary | ICD-10-CM | POA: Insufficient documentation

## 2023-01-13 DIAGNOSIS — M25511 Pain in right shoulder: Secondary | ICD-10-CM | POA: Insufficient documentation

## 2023-01-13 DIAGNOSIS — M542 Cervicalgia: Secondary | ICD-10-CM | POA: Insufficient documentation

## 2023-01-13 DIAGNOSIS — G8929 Other chronic pain: Secondary | ICD-10-CM | POA: Insufficient documentation

## 2023-01-13 DIAGNOSIS — M5459 Other low back pain: Secondary | ICD-10-CM | POA: Insufficient documentation

## 2023-01-13 DIAGNOSIS — R252 Cramp and spasm: Secondary | ICD-10-CM | POA: Insufficient documentation

## 2023-01-13 NOTE — Telephone Encounter (Signed)
Noted  

## 2023-01-13 NOTE — Telephone Encounter (Signed)
FYI: This call has been transferred to triage nurse: the Triage Nurse. Once the result note has been entered staff can address the message at that time.  Patient called in with the following symptoms:  Red Word:elevated blood pressure and Headache   Please advise at Mobile [phone] (mobile)  Message is routed to Provider Pool.

## 2023-01-13 NOTE — Telephone Encounter (Signed)
Initial Comment Caller's BP earlier was 160/93. Now it is 144/81. Denies any other sx/concerns at this time. Translation No Nurse Assessment Nurse: Stefano Gaul, RN, Dwana Curd Date/Time (Eastern Time): 01/13/2023 10:00:41 AM Confirm and document reason for call. If symptomatic, describe symptoms. ---Caller states her BP was 160/93 earlier this am. BP is now 141/82. She takes Olmesartan. has a toothache. Does the patient have any new or worsening symptoms? ---Yes Will a triage be completed? ---Yes Related visit to physician within the last 2 weeks? ---No Does the PT have any chronic conditions? (i.e. diabetes, asthma, this includes High risk factors for pregnancy, etc.) ---Yes List chronic conditions. ---HTN; hashimoto's Is this a behavioral health or substance abuse call? ---No Guidelines Guideline Title Affirmed Question Affirmed Notes Nurse Date/Time (Eastern Time) Blood Pressure - High Systolic BP >= 160 OR Diastolic >= 100 Stringer, RN, Vera 01/13/2023 10:03:17 AM Disp. Time [name] Disposition Final User 01/13/2023 10:07:39 AM SEE PCP WITHIN 3 DAYS Yes Stefano Gaul, RN, Dwana Curd Final Disposition 01/13/2023 10:07:39 AM SEE PCP WITHIN 3 DAYS Yes Stefano Gaul, RN, Vera PLEASE NOTE: All timestamps contained within this report are represented as [place] Standard Time. CONFIDENTIALTY NOTICE: This fax transmission is intended only for the addressee. It contains information that is legally privileged, confidential or otherwise protected from use or disclosure. If you are not the intended recipient, you are strictly prohibited from reviewing, disclosing, copying using or disseminating any of this information or taking any action in reliance on or regarding this information. If you have received this fax in error, please notify us immediately by telephone so that we can arrange for its return to [place]. Phone: [phone], Toll-Free: [phone], Fax: [phone] Page: 2 of 2 Call Id:  [id number] Caller Disagree/Comply Comply Caller Understands Yes PreDisposition Call Doctor Care Advice Given Per Guideline SEE PCP WITHIN 3 DAYS: * You need to be seen within 2 or 3 days. * The following things can help you reduce your blood pressure. HIGH BLOOD PRESSURE - LIFESTYLE MODIFICATIONS: * EAT HEALTHY: Eat a diet rich in fresh fruits and vegetables, dietary fiber, non-animal protein (e.g., soy), and low-fat dairy products. Avoid foods with a high content of saturated fat or cholesterol. * DECREASE SODIUM INTAKE: Aim to eat less than 2.4 g (100 mmol) of sodium each day. Unfortunately 75% of the salt in the average person's diet is in pre-processed foods. CALL BACK IF: * Your blood pressure is over 180/110 * Weakness or numbness of the face, arm or leg on one side of the body occurs * Difficulty walking, difficulty talking, or severe headache occurs * You become worse * Chest pain or difficulty breathing occurs CARE ADVICE given per High Blood Pressure (Adult) guideline. Referrals REFERRED TO PCP OFFICE

## 2023-01-15 NOTE — Telephone Encounter (Signed)
Spoke with patient. Pt states blood pressure has improved. Pt only available on Wed. Pt scheduled with Saguier next week

## 2023-01-20 ENCOUNTER — Ambulatory Visit: Payer: Medicaid Other | Admitting: Medical

## 2023-01-20 ENCOUNTER — Ambulatory Visit: Payer: Medicaid Other | Admitting: Physical Therapy

## 2023-01-22 ENCOUNTER — Other Ambulatory Visit: Payer: Self-pay

## 2023-01-22 ENCOUNTER — Encounter (HOSPITAL_BASED_OUTPATIENT_CLINIC_OR_DEPARTMENT_OTHER): Payer: Self-pay | Admitting: Emergency Medicine

## 2023-01-22 ENCOUNTER — Emergency Department (HOSPITAL_BASED_OUTPATIENT_CLINIC_OR_DEPARTMENT_OTHER)
Admission: EM | Admit: 2023-01-22 | Discharge: 2023-01-22 | Disposition: A | Discharge status: Home / Self Care | Payer: Medicaid Other | Attending: Emergency Medicine | Admitting: Emergency Medicine

## 2023-01-22 DIAGNOSIS — Z87891 Personal history of nicotine dependence: Secondary | ICD-10-CM | POA: Insufficient documentation

## 2023-01-22 DIAGNOSIS — I10 Essential (primary) hypertension: Secondary | ICD-10-CM | POA: Diagnosis not present

## 2023-01-22 DIAGNOSIS — E039 Hypothyroidism, unspecified: Secondary | ICD-10-CM | POA: Insufficient documentation

## 2023-01-22 DIAGNOSIS — Z79899 Other long term (current) drug therapy: Secondary | ICD-10-CM | POA: Diagnosis not present

## 2023-01-22 DIAGNOSIS — H578A1 Foreign body sensation, right eye: Secondary | ICD-10-CM | POA: Insufficient documentation

## 2023-01-22 DIAGNOSIS — T1501XA Foreign body in cornea, right eye, initial encounter: Secondary | ICD-10-CM | POA: Diagnosis not present

## 2023-01-22 MED ORDER — OFLOXACIN 0.3 % OP SOLN
1.0000 [drp] | OPHTHALMIC | Status: DC
  Administered 2023-01-22: 1 [drp] via OPHTHALMIC
  Filled 2023-01-22: qty 5

## 2023-01-22 MED ORDER — TETRACAINE HCL 0.5 % OP SOLN
1.0000 [drp] | Freq: Once | OPHTHALMIC | Status: AC
  Administered 2023-01-22: 1 [drp] via OPHTHALMIC

## 2023-01-22 MED ORDER — FLUORESCEIN SODIUM 1 MG OP STRP
1.0000 | ORAL_STRIP | Freq: Once | OPHTHALMIC | Status: AC
  Administered 2023-01-22: 1 via OPHTHALMIC

## 2023-01-22 MED ORDER — OFLOXACIN 0.3 % OP SOLN: 1.0000 [drp] | OPHTHALMIC | Status: DC

## 2023-01-22 NOTE — ED Provider Notes (Signed)
[facility] Provider Note  CSN: [id number] Arrival date & time: 01/22/23 1642  Chief Complaint(s) Eye Problem  HPI Jenna Arroyo is a 57 y.o. female presenting to the emergency department with concern for contact lens stuck in eye.  Patient reports right-sided eye pain.  Reports foreign body sensation.  No vision changes.  She reports that she is missing one of her contact lenses and thinks that it is stuck there.  She reports tearing as well.   Past Medical History Past Medical History:  Diagnosis Date   Anxiety    High cholesterol    Hypertension    Lyme disease    Thyroid disease    Patient Active Problem List   Diagnosis Date Noted   Cervical spine pain 12/08/2022   Chronic midline thoracic back pain 12/08/2022   Onychomycosis of great toe 12/08/2022   B12 deficiency 12/08/2022   Other fatigue 12/08/2022   Intertrigo 12/08/2022   Cervical strain 02/18/2021   Dyspepsia 02/18/2021   Urinary frequency 12/06/2020   Hyponatremia 09/23/2020   Anemia 06/14/2020   Nausea 04/05/2020   History of panic attacks 01/23/2019   GAD (generalized anxiety disorder) 04/22/2018   ADD (attention deficit disorder) 04/22/2018   Pharyngitis 12/11/2016   Axillary lymphadenopathy 12/11/2016   Lyme disease 12/11/2016   Dyslipidemia 06/18/2016   Essential hypertension 06/18/2016   Hypothyroidism 06/18/2016   Home Medication(s) Prior to Admission medications   Medication Sig Start Date End Date Taking? Authorizing Provider  ofloxacin (OCUFLOX) 0.3 % ophthalmic solution Place 1 drop into the right eye every hour while awake. 01/22/23  Yes Lonell Grandchild, MD  albuterol (VENTOLIN HFA) 108 (90 Base) MCG/ACT inhaler Inhale 1-2 puffs into the lungs every 6 (six) hours as needed. 07/03/22   Zola Button, Grayling Congress, DO  ALPRAZolam (XANAX) 0.5 MG tablet TAKE ONE TABLET EVERY MORNING, TAKE ONE TABLET DAILY AT NOON, TAKE ONE TABLET EVERY EVENING AND TAKE  ONE TABLET DAILY AT BEDTIME 10/01/22   Cottle, Steva Ready., MD  atenolol (TENORMIN) 25 MG tablet Take 1 tablet by mouth twice daily 12/02/21   Cottle, Steva Ready., MD  Cholecalciferol (VITAMIN D) 10 MCG/ML LIQD Take by mouth.    [provider]  ciclopirox (PENLAC) 8 % solution Apply topically at bedtime. Apply over nail and surrounding skin. Apply daily over previous coat. After seven (7) days, may remove with alcohol and continue cycle. 12/08/22   Donato Schultz, DO  diphenhydrAMINE (BENADRYL) 50 MG tablet Take 50 mg by mouth at bedtime as needed for itching.    [provider]  DULoxetine (CYMBALTA) 30 MG capsule Take 1 capsule (30 mg total) by mouth daily. 10/01/22   Cottle, Steva Ready., MD  ELDERBERRY PO Take by mouth.    [provider]  EPINEPHrine (EPIPEN 2-PAK) 0.3 mg/0.3 mL IJ SOAJ injection Inject 0.3 mg into the muscle as needed for anaphylaxis. 04/02/21   Donato Schultz, DO  fluconazole (DIFLUCAN) 150 MG tablet 1 po weekly x 4 12/23/22   Lowne Chase, Grayling Congress, DO  hydrOXYzine (VISTARIL) 25 MG capsule Take 1 capsule (25 mg total) by mouth every 8 (eight) hours as needed. 10/15/22   Cottle, Steva Ready., MD  irbesartan-hydrochlorothiazide (AVALIDE) 150-12.5 MG tablet Take 2 tablets by mouth daily. 12/11/22   Donato Schultz, DO  levocetirizine (XYZAL) 5 MG tablet Take 1 tablet (5 mg total) by mouth every evening. 05/22/22  Saguier, [name], PA-C  meloxicam (MOBIC) 7.5 MG tablet Take 1 tablet (7.5 mg total) by mouth daily. 12/08/22   Donato Schultz, DO  nystatin cream (MYCOSTATIN) Apply 1 Application topically 2 (two) times daily. 11/18/22   McElwee, Lauren A, NP  PREBIOTIC PRODUCT PO Take by mouth. Mix in shake and drink once a day.    [provider]  promethazine (PHENERGAN) 25 MG tablet Take 1 tablet (25 mg total) by mouth every 8 (eight) hours as needed for nausea or vomiting. 11/18/22   McElwee, Lauren A, NP  silver sulfADIAZINE  (SILVADENE) 1 % cream Apply 1 application topically daily. 01/27/21   Donato Schultz, DO  thyroid (NP THYROID) 15 MG tablet Take 3 tablets (45 mg total) by mouth daily. 12/02/22   Donato Schultz, DO  tizanidine (ZANAFLEX) 2 MG capsule Take 1 capsule (2 mg total) by mouth 3 (three) times daily. 09/01/21   Donato Schultz, DO  Vitamin D, Ergocalciferol, (DRISDOL) 1.25 MG (50000 UNIT) CAPS capsule Take 1 capsule by mouth once a week 09/09/22   Donato Schultz, DO                                                                                                                                    Past Surgical History Past Surgical History:  Procedure Laterality Date   ABDOMINAL EXPLORATION SURGERY     APPENDECTOMY     MASTOIDECTOMY     TONSILECTOMY/ADENOIDECTOMY WITH MYRINGOTOMY     Family History Family History  Problem Relation Age of Onset   Hypertension Mother    High Cholesterol Mother    Heart disease Father    High Cholesterol Father    Hypertension Sister    High Cholesterol Sister    Hypertension Brother    High Cholesterol Brother    Colon cancer Neg Hx     Social History Social History   Tobacco Use   Smoking status: Former    Current packs/day: 0.00    Types: Cigarettes    Quit date: 2006    Years since quitting: 18.5   Smokeless tobacco: Never   Tobacco comments:    socially   Advertising account planner   Vaping status: Never Used  Substance Use Topics   Alcohol use: No   Drug use: No   Allergies Ceftriaxone sodium in dextrose, Guaifenesin & derivatives, Meperidine, Other, Penicillins, Pseudoephedrine, Rosuvastatin calcium, Simvastatin, Ciprofloxacin, and Rocephin [ceftriaxone]  Review of Systems Review of Systems  All other systems reviewed and are negative.   Physical Exam Vital Signs  I have reviewed the triage vital signs BP (!) 146/85   Pulse 90   Temp 98.7 F (37.1 C) (Oral)   Resp 16   Ht 5\' 6"  (1.676 m)   Wt 98.4 kg   SpO2 100%   BMI  35.02 kg/m  Physical Exam Vitals and nursing note reviewed.  Constitutional:      Appearance: Normal appearance.  HENT:     Head: Normocephalic and atraumatic.     Mouth/Throat:     Mouth: Mucous membranes are moist.  Eyes:     Comments: Right conjunctival injection.  PERRL, EOMI.  Right eyelid swept and everted, normal lashes/lids, no clear retained contact lens, fluorescein exam bilaterally with no focal uptake, IOP 22 right eye.  Visual acuity reviewed in chart.  Cardiovascular:     Rate and Rhythm: Normal rate.  Pulmonary:     Effort: Pulmonary effort is normal. No respiratory distress.  Abdominal:     General: Abdomen is flat.  Musculoskeletal:        General: No deformity.  Skin:    General: Skin is warm and dry.     Capillary Refill: Capillary refill takes less than 2 seconds.  Neurological:     General: No focal deficit present.     Mental Status: She is alert. Mental status is at baseline.  Psychiatric:        Mood and Affect: Mood normal.        Behavior: Behavior normal.     ED Results and Treatments Labs (all labs ordered are listed, but only abnormal results are displayed) Labs Reviewed - No data to display                                                                                                                        Radiology No results found.  Pertinent labs & imaging results that were available during my care of the patient were reviewed by me and considered in my medical decision making (see MDM for details).  Medications Ordered in ED Medications  tetracaine (PONTOCAINE) 0.5 % ophthalmic solution 1 drop (has no administration in time range)  fluorescein ophthalmic strip 1 strip (has no administration in time range)  ofloxacin (OCUFLOX) 0.3 % ophthalmic solution 1 drop (has no administration in time range)                                                                                                                                      Procedures Procedures  (including critical care time)  Medical Decision Making / ED Course   MDM:  57 year old female presenting to the emergency department with right eye pain.  Patient's physical exam reassuring other than conjunctival injection.  No clear corneal abrasion,  foreign body, no sign of glaucoma, pupils reactive, vision is grossly normal.  Given unclear cause of symptoms and that it is Friday, and patient will difficulty following up with ophthalmology, discussed with Dr. Dione Booze on-call ophthalmologist.  He reports that based on my report he would be most concerned for very small corneal ulcer and recommends every hour while awake pseudomonal coverage eyedrops.  We have ofloxacin here so we will give patient bottle to go home with.  Will actually see the patient tomorrow and open his clinic to see the patient. Patient aware and will show up at 9 am to meet Dr. Dione Booze.  Reviewed allergy in chart, patient denies any allergic or anaphylactic reaction, reports may be some stomach upset and high blood pressure so I think that this would be worth trying and patient is agreeable to this since it will just be topical.   Will discharge patient to home. All questions answered. Patient comfortable with plan of discharge. Return precautions discussed with patient and specified on the after visit summary.        Medicines ordered and prescription drug management: Meds ordered this encounter  Medications   tetracaine (PONTOCAINE) 0.5 % ophthalmic solution 1 drop   fluorescein ophthalmic strip 1 strip   ofloxacin (OCUFLOX) 0.3 % ophthalmic solution 1 drop   ofloxacin (OCUFLOX) 0.3 % ophthalmic solution    Sig: Place 1 drop into the right eye every hour while awake.    -I have reviewed the patients home medicines and have made adjustments as needed   Consultations Obtained: I requested consultation with the optho,  and discussed lab and imaging findings as well as pertinent plan -  they recommend: follow up tomorrow, q1hr while awake eye drops  Reevaluation: After the interventions noted above, I reevaluated the patient and found that their symptoms have improved  Co morbidities that complicate the patient evaluation  Past Medical History:  Diagnosis Date   Anxiety    High cholesterol    Hypertension    Lyme disease    Thyroid disease       Dispostion: Disposition decision including need for hospitalization was considered, and patient discharged from emergency department.    Final Clinical Impression(s) / ED Diagnoses Final diagnoses:  Foreign body sensation, right eye     This chart was dictated using voice recognition software.  Despite best efforts to proofread,  errors can occur which can change the documentation meaning.    Lonell Grandchild, MD 01/22/23 [phone]

## 2023-01-22 NOTE — Discharge Instructions (Signed)
We evaluated you for your stuck contact lens.  We were not able to specifically find a contact lens in your eye and your other eye tests were overall reassuring.  We discussed your symptoms with Dr. Dione Booze, he would like to see you tomorrow at 9 AM.  Please go to his office and he will meet you there so that he can perform a detailed eye exam.  He is concerned you may have a corneal ulcer and would like you to begin antibiotic treatment.  Please use the eyedrops once every hour while awake.  If you have any other new or concerning symptoms such as sudden vision loss, please return to the emergency department, otherwise follow-up tomorrow with Dr.Groat.

## 2023-01-22 NOTE — ED Triage Notes (Signed)
Right eye contact stuck 4 hours ago , irritation and some pain per pt . No vision changes .

## 2023-01-25 ENCOUNTER — Telehealth: Payer: Self-pay | Admitting: Family Medicine

## 2023-01-25 ENCOUNTER — Ambulatory Visit: Payer: Medicaid Other | Admitting: Physical Therapy

## 2023-01-25 ENCOUNTER — Ambulatory Visit (HOSPITAL_BASED_OUTPATIENT_CLINIC_OR_DEPARTMENT_OTHER)
Admission: RE | Admit: 2023-01-25 | Discharge: 2023-01-25 | Disposition: A | Discharge status: Home / Self Care | Payer: Medicaid Other | Source: Ambulatory Visit | Attending: Family Medicine | Admitting: Family Medicine

## 2023-01-25 DIAGNOSIS — M545 Low back pain, unspecified: Secondary | ICD-10-CM | POA: Diagnosis not present

## 2023-01-25 DIAGNOSIS — M5459 Other low back pain: Secondary | ICD-10-CM

## 2023-01-25 DIAGNOSIS — M542 Cervicalgia: Secondary | ICD-10-CM

## 2023-01-25 DIAGNOSIS — M546 Pain in thoracic spine: Secondary | ICD-10-CM | POA: Diagnosis not present

## 2023-01-25 DIAGNOSIS — E559 Vitamin D deficiency, unspecified: Secondary | ICD-10-CM

## 2023-01-25 DIAGNOSIS — R252 Cramp and spasm: Secondary | ICD-10-CM | POA: Diagnosis not present

## 2023-01-25 DIAGNOSIS — M48061 Spinal stenosis, lumbar region without neurogenic claudication: Secondary | ICD-10-CM | POA: Diagnosis not present

## 2023-01-25 DIAGNOSIS — G8929 Other chronic pain: Secondary | ICD-10-CM

## 2023-01-25 DIAGNOSIS — M549 Dorsalgia, unspecified: Secondary | ICD-10-CM | POA: Diagnosis not present

## 2023-01-25 DIAGNOSIS — M47814 Spondylosis without myelopathy or radiculopathy, thoracic region: Secondary | ICD-10-CM | POA: Diagnosis not present

## 2023-01-25 DIAGNOSIS — M25511 Pain in right shoulder: Secondary | ICD-10-CM | POA: Diagnosis not present

## 2023-01-25 DIAGNOSIS — R293 Abnormal posture: Secondary | ICD-10-CM | POA: Diagnosis not present

## 2023-01-25 DIAGNOSIS — M47816 Spondylosis without myelopathy or radiculopathy, lumbar region: Secondary | ICD-10-CM | POA: Diagnosis not present

## 2023-01-25 MED ORDER — VITAMIN D (ERGOCALCIFEROL) 1.25 MG (50000 UNIT) PO CAPS
50000.0000 [IU] | ORAL_CAPSULE | ORAL | 1 refills | Status: DC
Start: 2023-01-25 — End: 2023-03-25

## 2023-01-25 NOTE — Telephone Encounter (Signed)
Prescription Request  01/25/2023  Is this a "Controlled Substance" medicine? No  LOV: 12/08/2022  What is the name of the medication or equipment? Vitamin D, Ergocalciferol, (DRISDOL) 1.25 MG (50000 UNIT) CAPS capsule  Have you contacted your pharmacy to request a refill? Yes   Which pharmacy would you like this sent to?    [facility] [address] [place], [place] - [address], [place] [address] Phone: [phone]  Fax: [phone]   Please advise at Mobile [phone] (mobile)

## 2023-01-25 NOTE — Telephone Encounter (Signed)
Refill sent.

## 2023-01-25 NOTE — Therapy (Signed)
OUTPATIENT PHYSICAL THERAPY EVALUATION   Patient Name: Jenna Arroyo MRN: [id number] DOB:12-21-1965, 57 y.o., female Today's Date: 01/25/2023  END OF SESSION:  PT End of Session - 01/25/23 1539     Visit Number 1    Date for PT Re-Evaluation 03/22/23    Authorization Type [employer]    PT Start Time 1534    PT Stop Time 1620    PT Time Calculation (min) 46 min    Activity Tolerance Patient tolerated treatment well    Behavior During Therapy [facility] for tasks assessed/performed             Past Medical History:  Diagnosis Date   Anxiety    High cholesterol    Hypertension    Lyme disease    Thyroid disease    Past Surgical History:  Procedure Laterality Date   ABDOMINAL EXPLORATION SURGERY     APPENDECTOMY     MASTOIDECTOMY     TONSILECTOMY/ADENOIDECTOMY WITH MYRINGOTOMY     Patient Active Problem List   Diagnosis Date Noted   Cervical spine pain 12/08/2022   Chronic midline thoracic back pain 12/08/2022   Onychomycosis of great toe 12/08/2022   B12 deficiency 12/08/2022   Other fatigue 12/08/2022   Intertrigo 12/08/2022   Cervical strain 02/18/2021   Dyspepsia 02/18/2021   Urinary frequency 12/06/2020   Hyponatremia 09/23/2020   Anemia 06/14/2020   Nausea 04/05/2020   History of panic attacks 01/23/2019   GAD (generalized anxiety disorder) 04/22/2018   ADD (attention deficit disorder) 04/22/2018   Pharyngitis 12/11/2016   Axillary lymphadenopathy 12/11/2016   Lyme disease 12/11/2016   Dyslipidemia 06/18/2016   Essential hypertension 06/18/2016   Hypothyroidism 06/18/2016    PCP: Donato Schultz, DO   REFERRING PROVIDER: Donato Schultz, *   REFERRING DIAG:  M54.2 (ICD-10-CM) - Neck pain  M54.6,G89.29 (ICD-10-CM) - Chronic midline thoracic back pain    THERAPY DIAG:  Pain in thoracic spine - Plan: PT plan of care cert/re-cert  Other low back pain - Plan: PT plan of care cert/re-cert  Cervicalgia - Plan: PT plan of  care cert/re-cert  Chronic right shoulder pain - Plan: PT plan of care cert/re-cert  Abnormal posture - Plan: PT plan of care cert/re-cert  Cramp and spasm - Plan: PT plan of care cert/re-cert  Rationale for Evaluation and Treatment: Rehabilitation  ONSET DATE: 1.5 years ago  SUBJECTIVE:  SUBJECTIVE STATEMENT: Patient reports history of upper and lower back pain that has been ongoing for about a year and a half after falling down stairs while wearing flip flops. She hit her head but was able to get up and walk two miles afterwards.  She also has history of migraines.   The pain comes and goes, gets pain if sits too long, its uncomfortable.  Pain is from neck, middle back to lower back.  She also has R shoulder pain for about a year.  She has been going through a lot right now after death of mother, losing home, and currently between jobs.   Hand dominance: Right  PERTINENT HISTORY:  History of migraines, hypothyroidism, lyme disease, HTN.   PAIN:  Are you having pain? Yes: NPRS scale: 3/10 Pain location: back, thoracic spine, neck Pain description: achey Aggravating factors: sitting Relieving factors: tiger balm, laying on floor and stretching  PRECAUTIONS: None  RED FLAGS: None   WEIGHT BEARING RESTRICTIONS: No  FALLS:  Has patient fallen in last 6 months? No  LIVING ENVIRONMENT: Lives with:  lives with family friend Lives in: House/apartment Stairs: Yes: Internal: 14 steps; on right going up, on left going up, and can reach both and External: 12 steps; on right going up, on left going up, and can reach both Has following equipment at home: None  OCCUPATION: in between jobs currently  PLOF: Independent and Leisure: gardening, taking walks, fish, kayak  PATIENT GOALS:  "getting the most healthy spine that I can have and have less pain"  NEXT MD VISIT: 01/28/23  OBJECTIVE:   DIAGNOSTIC FINDINGS:  Imaging taken on 01/25/23, results not available yet.    PATIENT SURVEYS:  NDI 14/50 = 28% mild  COGNITION: Overall cognitive status: Within functional limits for tasks assessed  SENSATION: WFL  POSTURE: rounded shoulders and forward head R shoulder anterior  PALPATION: Diffuse tenderness throughout cervical and thoracic spine,  hypomobility throughout cervical, thoracic and lumbar spine, tenderness bil erector spinae, bil QL, bil SIJ, R piriformis.     CERVICAL ROM:   Active ROM A/PROM (deg) eval  Flexion 30  Extension 50  Right lateral flexion 22  Left lateral flexion 28  Right rotation 60  Left rotation 65   (Blank rows = not tested)  UPPER EXTREMITY ROM: 4 inches difference between functional internal rotation on R compared to left with reaching behind back*  Active ROM Right eval Left eval  Shoulder flexion 145 160  Shoulder extension 55 55  Shoulder abduction 140p! 170  Shoulder internal rotation 65 80  Shoulder external rotation 35p! 85   (Blank rows = not tested)  UPPER EXTREMITY MMT:    MMT Right eval Left eval  Shoulder flexion 5 5  Shoulder extension    Shoulder abduction 5 5  Shoulder internal rotation 5 5  Shoulder external rotation 5 5  Elbow flexion 5 5  Elbow extension 5 5  Wrist flexion 5 5  Wrist extension 5 5  Grip strength good good   (Blank rows = not tested)  LOWER EXTREMITY MMT:    MMT Right eval Left eval  Hip flexion 5 5  Hip extension    Hip abduction 5 5  Hip adduction 5 5  Knee flexion 5 5  Knee extension 5 5  Ankle dorsiflexion 5 5  Ankle plantarflexion 5 5   (Blank rows = not tested)  LUMBAR ROM:   Active  AROM  eval  Flexion To ankles  Extension WNL  Right lateral flexion To knee, inc pain  Left lateral flexion To knee, inc pain  Right rotation 50%, inc pain  Left rotation  50%, inc pain   (Blank rows = not tested)    CERVICAL SPECIAL TESTS:  Spurling's test: Negative  LUMBAR SPECIAL TESTS:  Straight leg raise test: Negative, FABER test: Negative, and Long sit test: Positive R leg longer sitting  FUNCTIONAL TESTS:  5 times sit to stand: 10 seconds without UE assist.   TODAY'S TREATMENT:                                                                                                                              DATE:     PATIENT EDUCATION:  Education details: findings, POC Person educated: Patient Education method: Explanation Education comprehension: verbalized understanding  HOME EXERCISE PROGRAM: TBD  ASSESSMENT:  CLINICAL IMPRESSION: Patient is a 57 y.o. right hand dominant female who was seen today for physical therapy evaluation and treatment for chronic thoracic back and neck pain starting ~ 1.5 years ago after fall down stairs.  She demonstrates increased muscle tension and tenderness throughout cervical, thoracic and lumbar erector spinae; impaired posture with forward head, R shoulder anterior and decreased ROM with positive painful arch suggesting impingement; and positive supine to long sit test for R rotated innominate.  ILAISAANE MARTS would benefit from skilled physical therapy to decrease neck and back pain as well as improve posture and R shoulder pain.   Today she was provided information on inexpensive TENS unit and we also discussed TrDN which she is very interested in.    OBJECTIVE IMPAIRMENTS: decreased activity tolerance, decreased ROM, hypomobility, increased fascial restrictions, impaired perceived functional ability, increased muscle spasms, impaired flexibility, impaired UE functional use, improper body mechanics, postural dysfunction, and pain.   ACTIVITY LIMITATIONS: carrying, lifting, bending, sitting, reach over head, locomotion level, and caring for others  PARTICIPATION LIMITATIONS: meal prep, cleaning, laundry, driving,  shopping, community activity, occupation, and yard work  PERSONAL FACTORS: Past/current experiences, Time since onset of injury/illness/exacerbation, and 1-2 comorbidities: History of migraines, hypothyroidism, lyme disease, HTN.   are also affecting patient's functional outcome.   REHAB POTENTIAL: Good  CLINICAL DECISION MAKING: Evolving/moderate complexity  EVALUATION COMPLEXITY: Moderate   GOALS: Goals reviewed with patient? Yes  SHORT TERM GOALS: Target date: 02/08/2023   Patient will be independent with initial HEP.  Baseline: needs Goal status: INITIAL  2.  Patient will demonstrate negative supine to long sit test Baseline: positive on right Goal status: INITIAL   LONG TERM GOALS: Target date: 03/22/2023   Patient will be independent with advanced/ongoing HEP to improve outcomes and carryover.  Baseline:  Goal status: INITIAL  2.  Patient will report 75% improvement in neck/upper back pain to improve QOL.  Baseline:  Goal status: INITIAL  3.  Patient will report at least 7 points improvement on NDI to demonstrate improved functional ability.  Baseline: 14/50 Goal status: INITIAL  4.  Patient will report 75% improvement in low back pain to improve QOL Baseline:  Goal status: INITIAL  5. Patient will demonstrate full lumbar AROM without pain to complete ADLs   Baseline: see objective Goal status: INITIAL   6. Patient will demonstrate full R shoulder AROM without pain to complete ADLs.   Baseline: see objective Goal status: INITIAL    PLAN:  PT FREQUENCY: 1-2x/week  PT DURATION: 8 weeks  PLANNED INTERVENTIONS: Therapeutic exercises, Therapeutic activity, Neuromuscular re-education, Balance training, Gait training, Patient/Family education, Self Care, Joint mobilization, Joint manipulation, Dry Needling, Spinal manipulation, Spinal mobilization, Cryotherapy, Moist heat, Taping, Ultrasound, Manual therapy, and Re-evaluation  PLAN FOR NEXT SESSION: MET for  SIJ, core stabilization exercises, postural exercises, manual therapy including TrDN.    Jena Gauss, PT 01/25/2023, 5:11 PM

## 2023-01-27 ENCOUNTER — Ambulatory Visit: Payer: Medicaid Other | Admitting: Physical Therapy

## 2023-01-27 ENCOUNTER — Encounter: Payer: Self-pay | Admitting: Physical Therapy

## 2023-01-27 DIAGNOSIS — M542 Cervicalgia: Secondary | ICD-10-CM | POA: Diagnosis not present

## 2023-01-27 DIAGNOSIS — M546 Pain in thoracic spine: Secondary | ICD-10-CM | POA: Diagnosis not present

## 2023-01-27 DIAGNOSIS — G8929 Other chronic pain: Secondary | ICD-10-CM | POA: Diagnosis not present

## 2023-01-27 DIAGNOSIS — R293 Abnormal posture: Secondary | ICD-10-CM

## 2023-01-27 DIAGNOSIS — R252 Cramp and spasm: Secondary | ICD-10-CM | POA: Diagnosis not present

## 2023-01-27 DIAGNOSIS — M5459 Other low back pain: Secondary | ICD-10-CM | POA: Diagnosis not present

## 2023-01-27 DIAGNOSIS — M25511 Pain in right shoulder: Secondary | ICD-10-CM | POA: Diagnosis not present

## 2023-01-27 NOTE — Patient Instructions (Signed)

## 2023-01-27 NOTE — Therapy (Signed)
OUTPATIENT PHYSICAL THERAPY EVALUATION   Patient Name: Jenna Arroyo MRN: [id number] DOB:11/02/1965, 57 y.o., female Today's Date: 01/27/2023  END OF SESSION:  PT End of Session - 01/27/23 0852     Visit Number 2    Date for PT Re-Evaluation 03/22/23    Authorization Type [facility] Managed Medicaid    Authorization Time Period 01/25/23-03/25/23    Authorization - Visit Number 1    Authorization - Number of Visits 5    PT Start Time 0850    PT Stop Time 0945    PT Time Calculation (min) 55 min    Activity Tolerance Patient tolerated treatment well    Behavior During Therapy [facility] for tasks assessed/performed             Past Medical History:  Diagnosis Date   Anxiety    High cholesterol    Hypertension    Lyme disease    Thyroid disease    Past Surgical History:  Procedure Laterality Date   ABDOMINAL EXPLORATION SURGERY     APPENDECTOMY     MASTOIDECTOMY     TONSILECTOMY/ADENOIDECTOMY WITH MYRINGOTOMY     Patient Active Problem List   Diagnosis Date Noted   Cervical spine pain 12/08/2022   Chronic midline thoracic back pain 12/08/2022   Onychomycosis of great toe 12/08/2022   B12 deficiency 12/08/2022   Other fatigue 12/08/2022   Intertrigo 12/08/2022   Cervical strain 02/18/2021   Dyspepsia 02/18/2021   Urinary frequency 12/06/2020   Hyponatremia 09/23/2020   Anemia 06/14/2020   Nausea 04/05/2020   History of panic attacks 01/23/2019   GAD (generalized anxiety disorder) 04/22/2018   ADD (attention deficit disorder) 04/22/2018   Pharyngitis 12/11/2016   Axillary lymphadenopathy 12/11/2016   Lyme disease 12/11/2016   Dyslipidemia 06/18/2016   Essential hypertension 06/18/2016   Hypothyroidism 06/18/2016    PCP: Donato Schultz, DO   REFERRING PROVIDER: Donato Schultz, *   REFERRING DIAG:  M54.2 (ICD-10-CM) - Neck pain  M54.6,G89.29 (ICD-10-CM) - Chronic midline thoracic back pain    THERAPY DIAG:  Pain in thoracic spine  Other  low back pain  Cervicalgia  Chronic right shoulder pain  Abnormal posture  Cramp and spasm  Rationale for Evaluation and Treatment: Rehabilitation  ONSET DATE: 1.5 years ago  SUBJECTIVE:                                                                                                                                                                                                         SUBJECTIVE STATEMENT:  01/27/23:   From IE: Patient reports history of upper and lower back pain that has been ongoing for about a year and a half after falling down stairs while wearing flip flops. She hit her head but was able to get up and walk two miles afterwards.  She also has history of migraines.   The pain comes and goes, gets pain if sits too long, its uncomfortable.  Pain is from neck, middle back to lower back.  She also has R shoulder pain for about a year.  She has been going through a lot right now after death of mother, losing home, and currently between jobs.   Hand dominance: Right  PERTINENT HISTORY:  History of migraines, hypothyroidism, lyme disease, HTN.   PAIN:  Are you having pain? Yes: NPRS scale: 2/10 Pain location: R arm underneath  PRECAUTIONS: None  RED FLAGS: None   WEIGHT BEARING RESTRICTIONS: No  FALLS:  Has patient fallen in last 6 months? No  LIVING ENVIRONMENT: Lives with:  lives with family friend Lives in: House/apartment Stairs: Yes: Internal: 14 steps; on right going up, on left going up, and can reach both and External: 12 steps; on right going up, on left going up, and can reach both Has following equipment at home: None  OCCUPATION: in between jobs currently  PLOF: Independent and Leisure: gardening, taking walks, fish, kayak  PATIENT GOALS: "getting the most healthy spine that I can have and have less pain"  NEXT MD VISIT: 01/28/23  OBJECTIVE:   DIAGNOSTIC FINDINGS:  Imaging taken on 01/25/23, results not available yet.    PATIENT SURVEYS:   NDI 14/50 = 28% mild  COGNITION: Overall cognitive status: Within functional limits for tasks assessed  SENSATION: WFL  POSTURE: rounded shoulders and forward head R shoulder anterior  PALPATION: Diffuse tenderness throughout cervical and thoracic spine,  hypomobility throughout cervical, thoracic and lumbar spine, tenderness bil erector spinae, bil QL, bil SIJ, R piriformis.     CERVICAL ROM:   Active ROM A/PROM (deg) eval  Flexion 30  Extension 50  Right lateral flexion 22  Left lateral flexion 28  Right rotation 60  Left rotation 65   (Blank rows = not tested)  UPPER EXTREMITY ROM: 4 inches difference between functional internal rotation on R compared to left with reaching behind back*  Active ROM Right eval Left eval  Shoulder flexion 145 160  Shoulder extension 55 55  Shoulder abduction 140p! 170  Shoulder internal rotation 65 80  Shoulder external rotation 35p! 85   (Blank rows = not tested)  UPPER EXTREMITY MMT:    MMT Right eval Left eval  Shoulder flexion 5 5  Shoulder extension    Shoulder abduction 5 5  Shoulder internal rotation 5 5  Shoulder external rotation 5 5  Elbow flexion 5 5  Elbow extension 5 5  Wrist flexion 5 5  Wrist extension 5 5  Grip strength good good   (Blank rows = not tested)  LOWER EXTREMITY MMT:    MMT Right eval Left eval  Hip flexion 5 5  Hip extension    Hip abduction 5 5  Hip adduction 5 5  Knee flexion 5 5  Knee extension 5 5  Ankle dorsiflexion 5 5  Ankle plantarflexion 5 5   (Blank rows = not tested)  LUMBAR ROM:   Active  AROM  eval  Flexion To ankles  Extension WNL  Right lateral flexion To knee, inc pain  Left lateral flexion To  knee, inc pain  Right rotation 50%, inc pain  Left rotation 50%, inc pain   (Blank rows = not tested)    CERVICAL SPECIAL TESTS:  Spurling's test: Negative  LUMBAR SPECIAL TESTS:  Straight leg raise test: Negative, FABER test: Negative, and Long sit test: Positive  R leg longer sitting  FUNCTIONAL TESTS:  5 times sit to stand: 10 seconds without UE assist.   TODAY'S TREATMENT:                                                                                                                              DATE:     01/27/2023 Therapeutic Exercise: to improve strength and mobility.  Demo, verbal and tactile cues throughout for technique. Nustep L5 x 6 min  Seated chin tucks Seated levator and SCM stretch Seated shoulder rolls retro Seated scap squeeze Seated bil ER with scap squeeze RTB x 15 Supine PPT - needed a lot of tactile cueing to relax upper body and legs to isolate low back and engage abs Supine PPT with march Manual Therapy: to decrease muscle spasm and pain and improve mobility Muscle energy technique R hip extension to correct SIJ, negative supine to sit test following.  STM/TPR to bil UT, R glutes/piriformis and SIJ, skilled palpation and monitoring during dry needling. Trigger Point Dry-Needling  Treatment instructions: Expect mild to moderate muscle soreness. S/S of pneumothorax if dry needled over a lung field, and to seek immediate medical attention should they occur. Patient verbalized understanding of these instructions and education. Patient Consent Given: Yes Education handout provided: Previously provided Muscles treated: R piriformis, bil L4/5 multifidi, bil UT Electrical stimulation performed: No Parameters: N/A Treatment response/outcome: Twitch Response Elicited and Palpable Increase in Muscle Length   PATIENT EDUCATION:  Education details: HEP Person educated: Patient Education method: Explanation Education comprehension: verbalized understanding  HOME EXERCISE PROGRAM: Access Code: [email or web address] URL: [email or web address] Date: 01/27/2023 Prepared by: Harrie Foreman  Exercises - Seated Cervical Retraction  - 3 x daily - 7 x weekly - 1 sets - 10 reps - 5 sec  hold - Seated Scapular Retraction  - 1 x  daily - 7 x weekly - 1 sets - 10 reps - 5 sec  hold - Shoulder External Rotation and Scapular Retraction with Resistance  - 1 x daily - 7 x weekly - 2 sets - 10 reps - Gentle Levator Scapulae Stretch  - 1 x daily - 7 x weekly - 1 sets - 3 reps - 10-15 hold - Sternocleidomastoid Stretch  - 1 x daily - 7 x weekly - 1 sets - 3 reps - 10-15 sec  hold - Supine Posterior Pelvic Tilt  - 1 x daily - 7 x weekly - 2 sets - 10 reps - Supine March  - 1 x daily - 7 x weekly - 2 sets - 10 reps - Supine Hip Adduction Isometric with Ball  - 1 x daily - 7 x weekly -  1 sets - 10 reps - 10 sec hold  ASSESSMENT:  CLINICAL IMPRESSION: LONISHA BOBBY reported mostly shoulder pain today.  Started with postural exercises and core strengthening, very challenged with isolating abs and not engaging either UE or LE.  She had positive supine to long sit test, but responded well to MET with negative retest after one round.  After explanation of DN rational, procedures, outcomes and potential side effects, patient verbalized consent to DN treatment in conjunction with manual STM/DTM and TPR to reduce ttp/muscle tension. Muscles treated as indicated above. DN produced normal response with good twitches elicited resulting in palpable reduction in pain/ttp and muscle tension, with patient noting less pain upon initiation of movement following DN. Pt educated to expect mild to moderate muscle soreness for up to 24-48 hrs and instructed to continue prescribed home exercise program and current activity level with pt verbalizing understanding of theses instructions.   PURA PICINICH continues to demonstrate potential for improvement and would benefit from continued skilled therapy to address impairments.       OBJECTIVE IMPAIRMENTS: decreased activity tolerance, decreased ROM, hypomobility, increased fascial restrictions, impaired perceived functional ability, increased muscle spasms, impaired flexibility, impaired UE functional use, improper  body mechanics, postural dysfunction, and pain.   ACTIVITY LIMITATIONS: carrying, lifting, bending, sitting, reach over head, locomotion level, and caring for others  PARTICIPATION LIMITATIONS: meal prep, cleaning, laundry, driving, shopping, community activity, occupation, and yard work  PERSONAL FACTORS: Past/current experiences, Time since onset of injury/illness/exacerbation, and 1-2 comorbidities: History of migraines, hypothyroidism, lyme disease, HTN.   are also affecting patient's functional outcome.   REHAB POTENTIAL: Good  CLINICAL DECISION MAKING: Evolving/moderate complexity  EVALUATION COMPLEXITY: Moderate   GOALS: Goals reviewed with patient? Yes  SHORT TERM GOALS: Target date: 02/08/2023   Patient will be independent with initial HEP.  Baseline: needs Goal status: IN PROGRESS given  2.  Patient will demonstrate negative supine to long sit test Baseline: positive on right Goal status: MET 01/27/23   LONG TERM GOALS: Target date: 03/22/2023   Patient will be independent with advanced/ongoing HEP to improve outcomes and carryover.  Baseline:  Goal status: IN PROGRESS  2.  Patient will report 75% improvement in neck/upper back pain to improve QOL.  Baseline:  Goal status: IN PROGRESS  3.  Patient will report at least 7 points improvement on NDI to demonstrate improved functional ability.  Baseline: 14/50 Goal status: IN PROGRESS  4.  Patient will report 75% improvement in low back pain to improve QOL Baseline:  Goal status: IN PROGRESS  5. Patient will demonstrate full lumbar AROM without pain to complete ADLs   Baseline: see objective Goal status: IN PROGRESS   6. Patient will demonstrate full R shoulder AROM without pain to complete ADLs.   Baseline: see objective Goal status: IN PROGRESS    PLAN:  PT FREQUENCY: 1-2x/week  PT DURATION: 8 weeks  PLANNED INTERVENTIONS: Therapeutic exercises, Therapeutic activity, Neuromuscular re-education,  Balance training, Gait training, Patient/Family education, Self Care, Joint mobilization, Joint manipulation, Dry Needling, Spinal manipulation, Spinal mobilization, Cryotherapy, Moist heat, Taping, Ultrasound, Manual therapy, and Re-evaluation  PLAN FOR NEXT SESSION: recheck SIJ, review HEP and progress, continue manual and TrDN as indicated.  Approved for 4 more visits, so emphasis on independence and self-management.    Jena Gauss, PT 01/27/2023, 10:04 AM

## 2023-01-28 ENCOUNTER — Ambulatory Visit: Payer: Medicaid Other | Admitting: Medical

## 2023-01-28 ENCOUNTER — Encounter: Payer: Self-pay | Admitting: Medical

## 2023-01-28 VITALS — BP 120/68 | HR 76 | Ht 66.0 in | Wt 205.8 lb

## 2023-01-28 DIAGNOSIS — F419 Anxiety disorder, unspecified: Secondary | ICD-10-CM | POA: Diagnosis not present

## 2023-01-28 DIAGNOSIS — Z566 Other physical and mental strain related to work: Secondary | ICD-10-CM | POA: Diagnosis not present

## 2023-01-28 DIAGNOSIS — I10 Essential (primary) hypertension: Secondary | ICD-10-CM

## 2023-01-28 MED ORDER — HYDROXYZINE PAMOATE 25 MG PO CAPS
25.0000 mg | ORAL_CAPSULE | Freq: Three times a day (TID) | ORAL | 0 refills | Status: DC | PRN
Start: 2023-01-28 — End: 2023-03-23

## 2023-01-28 NOTE — Patient Instructions (Addendum)
Htn- bp well controlled today. Continue current avalide and tenormin.   Recent high level stress/anxiety low level with recent brief new job. Pt has used hydroxyzine in the past(refill today since you can't find the bottle). Continue cymbalta.  Follow up with Dr. Laury Axon as regularly scheduled or sooner if needed.

## 2023-01-28 NOTE — Progress Notes (Signed)
Subjective:    Patient ID: Jenna Arroyo, female    DOB: 06-Oct-1965, 57 y.o.   MRN: [id number]  HPI  Pt in for follow up for Htn and follow up form ED evaluation due to possible retained contact.   ED note reviewed.  Pt seen by ED and contact could not be found. Has seen specialist Eye MD next day. Her eye feels better now.  Htn- on avalide and tenormin. BP controlled today. Was high in the emergency dept.   Recent stress related to  brief new job and computer issues(no longer at that job). Pt states some issues with her role. 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  Review of Systems  Constitutional:  Negative for chills, fatigue and fever.  HENT:  Negative for congestion, ear discharge and ear pain.   Respiratory:  Negative for shortness of breath and wheezing.   Cardiovascular:  Negative for chest pain and palpitations.  Gastrointestinal:  Negative for abdominal pain, nausea and vomiting.  Genitourinary:  Negative for dysuria.  Musculoskeletal:  Negative for back pain and myalgias.  Skin:  Negative for rash.  Neurological:  Negative for dizziness, speech difficulty, weakness and light-headedness.  Hematological:  Negative for adenopathy. Does not bruise/bleed easily.  Psychiatric/Behavioral:  Negative for behavioral problems, decreased concentration, dysphoric mood and suicidal ideas. The patient is nervous/anxious.     Past Medical History:  Diagnosis Date   Anxiety    High cholesterol    Hypertension    Lyme disease    Thyroid disease      Social History   Socioeconomic History   Marital status: Divorced    Spouse name: Not on file   Number of children: Not on file   Years of education: Not on file   Highest education level: Not on file  Occupational History   Not on file  Tobacco Use   Smoking status: Former    Current packs/day: 0.00    Types: Cigarettes    Quit date: 2006    Years since quitting: 18.5   Smokeless tobacco: Never    Tobacco comments:    socially   Vaping Use   Vaping status: Never Used  Substance and Sexual Activity   Alcohol use: No   Drug use: No   Sexual activity: Not Currently    Birth control/protection: Post-menopausal  Other Topics Concern   Not on file  Social History Narrative   Not on file   Social Determinants of Health   Financial Resource Strain: Not on file  Food Insecurity: No Food Insecurity (11/25/2020)   Received from [facility]   Hunger Vital Sign    Worried About Running Out of Food in the Last Year: Never true    Ran Out of Food in the Last Year: Never true  Transportation Needs: No Transportation Needs (06/18/2020)   PRAPARE - Administrator, Civil Service (Medical): No    Lack of Transportation (Non-Medical): No  Physical Activity: Not on file  Stress: Not on file  Social Connections: Unknown (11/15/2021)   Received from [facility]   Social Network    Social Network: Not on file  Intimate Partner Violence: Unknown (10/07/2021)   Received from [facility]   HITS    Physically Hurt: Not on file    Insult or Talk Down To: Not on file    Threaten Physical Harm: Not on file    Scream or Curse: Not  on file    Past Surgical History:  Procedure Laterality Date   ABDOMINAL EXPLORATION SURGERY     APPENDECTOMY     MASTOIDECTOMY     TONSILECTOMY/ADENOIDECTOMY WITH MYRINGOTOMY      Family History  Problem Relation Age of Onset   Hypertension Mother    High Cholesterol Mother    Heart disease Father    High Cholesterol Father    Hypertension Sister    High Cholesterol Sister    Hypertension Brother    High Cholesterol Brother    Colon cancer Neg Hx     Allergies  Allergen Reactions   Ceftriaxone Sodium In Dextrose Other (See Comments)    "heart races and cannot breathe"   Guaifenesin & Derivatives Other (See Comments)    "heart races, dizziness, light-headedness"   Meperidine Other (See Comments)    "heart races"   Other Rash    Penicillins Hives   Pseudoephedrine Other (See Comments)    "heart races and high blood pressure"   Rosuvastatin Calcium Nausea And Vomiting    Muscle aches    Simvastatin Other (See Comments)    muscle aching    Ciprofloxacin    Rocephin [Ceftriaxone] Itching    Current Outpatient Medications on File Prior to Visit  Medication Sig Dispense Refill   albuterol (VENTOLIN HFA) 108 (90 Base) MCG/ACT inhaler Inhale 1-2 puffs into the lungs every 6 (six) hours as needed. 1 each 0   ALPRAZolam (XANAX) 0.5 MG tablet TAKE ONE TABLET EVERY MORNING, TAKE ONE TABLET DAILY AT NOON, TAKE ONE TABLET EVERY EVENING AND TAKE ONE TABLET DAILY AT BEDTIME 120 tablet 3   atenolol (TENORMIN) 25 MG tablet Take 1 tablet by mouth twice daily 180 tablet 0   Cholecalciferol (VITAMIN D) 10 MCG/ML LIQD Take by mouth.     ciclopirox (PENLAC) 8 % solution Apply topically at bedtime. Apply over nail and surrounding skin. Apply daily over previous coat. After seven (7) days, may remove with alcohol and continue cycle. 6.6 mL 0   diphenhydrAMINE (BENADRYL) 50 MG tablet Take 50 mg by mouth at bedtime as needed for itching.     DULoxetine (CYMBALTA) 30 MG capsule Take 1 capsule (30 mg total) by mouth daily. 90 capsule 1   ELDERBERRY PO Take by mouth.     EPINEPHrine (EPIPEN 2-PAK) 0.3 mg/0.3 mL IJ SOAJ injection Inject 0.3 mg into the muscle as needed for anaphylaxis. 1 each 0   fluconazole (DIFLUCAN) 150 MG tablet 1 po weekly x 4 4 tablet 0   hydrOXYzine (VISTARIL) 25 MG capsule Take 1 capsule (25 mg total) by mouth every 8 (eight) hours as needed. 60 capsule 1   irbesartan-hydrochlorothiazide (AVALIDE) 150-12.5 MG tablet Take 2 tablets by mouth daily. 180 tablet 1   levocetirizine (XYZAL) 5 MG tablet Take 1 tablet (5 mg total) by mouth every evening. 30 tablet 0   meloxicam (MOBIC) 7.5 MG tablet Take 1 tablet (7.5 mg total) by mouth daily. 30 tablet 0   nystatin cream (MYCOSTATIN) Apply 1 Application topically 2 (two) times  daily. 30 g 1   ofloxacin (OCUFLOX) 0.3 % ophthalmic solution Place 1 drop into the right eye every hour while awake.     PREBIOTIC PRODUCT PO Take by mouth. Mix in shake and drink once a day.     promethazine (PHENERGAN) 25 MG tablet Take 1 tablet (25 mg total) by mouth every 8 (eight) hours as needed for nausea or vomiting. 20 tablet 0   silver  sulfADIAZINE (SILVADENE) 1 % cream Apply 1 application topically daily. 50 g 0   thyroid (NP THYROID) 15 MG tablet Take 3 tablets (45 mg total) by mouth daily. 90 tablet 1   tizanidine (ZANAFLEX) 2 MG capsule Take 1 capsule (2 mg total) by mouth 3 (three) times daily. 30 capsule 1   Vitamin D, Ergocalciferol, (DRISDOL) 1.25 MG (50000 UNIT) CAPS capsule Take 1 capsule (50,000 Units total) by mouth once a week. 12 capsule 1   No current facility-administered medications on file prior to visit.    BP 118/65 (BP Location: Left Arm, Patient Position: Sitting, Cuff Size: Large)   Pulse 76   Ht 5\' 6"  (1.676 m)   Wt 205 lb 12.8 oz (93.4 kg)   SpO2 99%   BMI 33.22 kg/m        Objective:   Physical Exam  General Mental Status- Alert. General Appearance- Not in acute distress.   Skin General: Color- Normal Color. Moisture- Normal Moisture.  Neck Carotid Arteries- Normal color. Moisture- Normal Moisture. No carotid bruits. No JVD.  Chest and Lung Exam Auscultation: Breath Sounds:-Normal.  Cardiovascular Auscultation:Rythm- Regular. Murmurs & Other Heart Sounds:Auscultation of the heart reveals- No Murmurs.  Abdomen Inspection:-Inspeection Normal. Palpation/Percussion:Note:No mass. Palpation and Percussion of the abdomen reveal- Non Tender, Non Distended + BS, no rebound or guarding.   Neurologic Cranial Nerve exam:- CN III-XII intact(No nystagmus), symmetric smile. Strength:- 5/5 equal and symmetric strength both upper and lower extremities.       Assessment & Plan:   Patient Instructions  Htn- bp well controlled today. Continue  current avalide and tenormin.   Recent high level stress/anxiety low level with recent brief new job. Pt has used hydroxyzine in the past(refill today since you can't find the bottle). Continue cymbalta.  Follow up with Dr. Laury Axon as regularly scheduled or sooner if needed.     Esperanza Richters, PA-C

## 2023-02-01 ENCOUNTER — Ambulatory Visit: Payer: Medicaid Other | Admitting: Psychiatry

## 2023-02-01 DIAGNOSIS — Z538 Procedure and treatment not carried out for other reasons: Secondary | ICD-10-CM

## 2023-02-01 NOTE — Progress Notes (Signed)
Appt cancelled DT 1 hour wait by pt choice 

## 2023-02-02 ENCOUNTER — Telehealth: Payer: Self-pay | Admitting: *Deleted

## 2023-02-02 ENCOUNTER — Other Ambulatory Visit: Payer: Self-pay | Admitting: Family Medicine

## 2023-02-02 NOTE — Telephone Encounter (Signed)
Left detailed message on machine to call back if she would like referral.

## 2023-02-02 NOTE — Telephone Encounter (Signed)
Pt would like to know what would next steps be.

## 2023-02-02 NOTE — Telephone Encounter (Signed)
-----   Message from Donato Schultz sent at 02/01/2023  1:03 PM EDT ----- Some degenerative changes

## 2023-02-03 ENCOUNTER — Ambulatory Visit: Payer: Medicaid Other | Admitting: Physical Therapy

## 2023-02-03 ENCOUNTER — Telehealth: Payer: Self-pay | Admitting: Family Medicine

## 2023-02-03 ENCOUNTER — Encounter: Payer: Self-pay | Admitting: Physical Therapy

## 2023-02-03 DIAGNOSIS — G8929 Other chronic pain: Secondary | ICD-10-CM

## 2023-02-03 DIAGNOSIS — R252 Cramp and spasm: Secondary | ICD-10-CM

## 2023-02-03 DIAGNOSIS — R293 Abnormal posture: Secondary | ICD-10-CM

## 2023-02-03 DIAGNOSIS — M546 Pain in thoracic spine: Secondary | ICD-10-CM

## 2023-02-03 DIAGNOSIS — M542 Cervicalgia: Secondary | ICD-10-CM | POA: Diagnosis not present

## 2023-02-03 DIAGNOSIS — M5459 Other low back pain: Secondary | ICD-10-CM

## 2023-02-03 DIAGNOSIS — M25511 Pain in right shoulder: Secondary | ICD-10-CM | POA: Diagnosis not present

## 2023-02-03 NOTE — Telephone Encounter (Signed)
Pt came in stating that the pharmacy at [facility] on [address] was supposed to call because she is out of NP Thyroid medicine and is supposed to take three times every morning. Pt requested to be called with update on rx refill.

## 2023-02-03 NOTE — Telephone Encounter (Signed)
Pt called. Rx refilled yesterday

## 2023-02-03 NOTE — Therapy (Signed)
OUTPATIENT PHYSICAL THERAPY EVALUATION   Patient Name: Jenna Arroyo MRN: [id number] DOB:10-20-1965, 57 y.o., female Today's Date: 02/03/2023  END OF SESSION:  PT End of Session - 02/03/23 0936     Visit Number 3    Date for PT Re-Evaluation 03/22/23    Authorization Type [facility] Managed Medicaid    Authorization Time Period 01/25/23-03/25/23    Authorization - Visit Number 2    Authorization - Number of Visits 5    PT Start Time 0933    PT Stop Time 1028    PT Time Calculation (min) 55 min    Activity Tolerance Patient tolerated treatment well    Behavior During Therapy [facility] for tasks assessed/performed             Past Medical History:  Diagnosis Date   Anxiety    High cholesterol    Hypertension    Lyme disease    Thyroid disease    Past Surgical History:  Procedure Laterality Date   ABDOMINAL EXPLORATION SURGERY     APPENDECTOMY     MASTOIDECTOMY     TONSILECTOMY/ADENOIDECTOMY WITH MYRINGOTOMY     Patient Active Problem List   Diagnosis Date Noted   Cervical spine pain 12/08/2022   Chronic midline thoracic back pain 12/08/2022   Onychomycosis of great toe 12/08/2022   B12 deficiency 12/08/2022   Other fatigue 12/08/2022   Intertrigo 12/08/2022   Cervical strain 02/18/2021   Dyspepsia 02/18/2021   Urinary frequency 12/06/2020   Hyponatremia 09/23/2020   Anemia 06/14/2020   Nausea 04/05/2020   History of panic attacks 01/23/2019   GAD (generalized anxiety disorder) 04/22/2018   ADD (attention deficit disorder) 04/22/2018   Pharyngitis 12/11/2016   Axillary lymphadenopathy 12/11/2016   Lyme disease 12/11/2016   Dyslipidemia 06/18/2016   Essential hypertension 06/18/2016   Hypothyroidism 06/18/2016    PCP: Donato Schultz, DO   REFERRING PROVIDER: Donato Schultz, *   REFERRING DIAG:  M54.2 (ICD-10-CM) - Neck pain  M54.6,G89.29 (ICD-10-CM) - Chronic midline thoracic back pain    THERAPY DIAG:  Pain in thoracic spine  Other  low back pain  Cervicalgia  Chronic right shoulder pain  Abnormal posture  Cramp and spasm  Rationale for Evaluation and Treatment: Rehabilitation  ONSET DATE: 1.5 years ago  SUBJECTIVE:                                                                                                                                                                                                         SUBJECTIVE STATEMENT:  02/03/23: Just got her Xray, she is joining [employer], little sore after dry needling but pain is good today.    From IE: Patient reports history of upper and lower back pain that has been ongoing for about a year and a half after falling down stairs while wearing flip flops. She hit her head but was able to get up and walk two miles afterwards.  She also has history of migraines.   The pain comes and goes, gets pain if sits too long, its uncomfortable.  Pain is from neck, middle back to lower back.  She also has R shoulder pain for about a year.  She has been going through a lot right now after death of mother, losing home, and currently between jobs.   Hand dominance: Right  PERTINENT HISTORY:  History of migraines, hypothyroidism, lyme disease, HTN.   PAIN:  Are you having pain? Yes: NPRS scale: 2/10 Pain location: R shoulder  PRECAUTIONS: None  RED FLAGS: None   WEIGHT BEARING RESTRICTIONS: No  FALLS:  Has patient fallen in last 6 months? No  LIVING ENVIRONMENT: Lives with:  lives with family friend Lives in: House/apartment Stairs: Yes: Internal: 14 steps; on right going up, on left going up, and can reach both and External: 12 steps; on right going up, on left going up, and can reach both Has following equipment at home: None  OCCUPATION: in between jobs currently  PLOF: Independent and Leisure: gardening, taking walks, fish, kayak  PATIENT GOALS: "getting the most healthy spine that I can have and have less pain"  NEXT MD VISIT: 01/28/23  OBJECTIVE:    DIAGNOSTIC FINDINGS:  Imaging taken on 01/25/23, results not available yet.    PATIENT SURVEYS:  NDI 14/50 = 28% mild  COGNITION: Overall cognitive status: Within functional limits for tasks assessed  SENSATION: WFL  POSTURE: rounded shoulders and forward head R shoulder anterior  PALPATION: Diffuse tenderness throughout cervical and thoracic spine,  hypomobility throughout cervical, thoracic and lumbar spine, tenderness bil erector spinae, bil QL, bil SIJ, R piriformis.     CERVICAL ROM:   Active ROM A/PROM (deg) eval  Flexion 30  Extension 50  Right lateral flexion 22  Left lateral flexion 28  Right rotation 60  Left rotation 65   (Blank rows = not tested)  UPPER EXTREMITY ROM: 4 inches difference between functional internal rotation on R compared to left with reaching behind back*  Active ROM Right eval Left eval  Shoulder flexion 145 160  Shoulder extension 55 55  Shoulder abduction 140p! 170  Shoulder internal rotation 65 80  Shoulder external rotation 35p! 85   (Blank rows = not tested)  UPPER EXTREMITY MMT:    MMT Right eval Left eval  Shoulder flexion 5 5  Shoulder extension    Shoulder abduction 5 5  Shoulder internal rotation 5 5  Shoulder external rotation 5 5  Elbow flexion 5 5  Elbow extension 5 5  Wrist flexion 5 5  Wrist extension 5 5  Grip strength good good   (Blank rows = not tested)  LOWER EXTREMITY MMT:    MMT Right eval Left eval  Hip flexion 5 5  Hip extension    Hip abduction 5 5  Hip adduction 5 5  Knee flexion 5 5  Knee extension 5 5  Ankle dorsiflexion 5 5  Ankle plantarflexion 5 5   (Blank rows = not tested)  LUMBAR ROM:   Active  AROM  eval  Flexion To ankles  Extension WNL  Right lateral flexion To knee, inc pain  Left lateral flexion To knee, inc pain  Right rotation 50%, inc pain  Left rotation 50%, inc pain   (Blank rows = not tested)    CERVICAL SPECIAL TESTS:  Spurling's test: Negative  LUMBAR  SPECIAL TESTS:  Straight leg raise test: Negative, FABER test: Negative, and Long sit test: Positive R leg longer sitting  FUNCTIONAL TESTS:  5 times sit to stand: 10 seconds without UE assist.   TODAY'S TREATMENT:                                                                                                                              DATE:   02/03/23 Therapeutic Exercise: to improve strength and mobility.  Demo, verbal and tactile cues throughout for technique.  Nustep L5 x 6 min Open books x 10 each side On pool noodle: Pec stretch Open book Back stroke Bridges with TrA x 10 - challenging Isometric shoulder exercises -R - flexion, extension, ER and IR Table slides and shoulder ER - demo only Self Care: Review of imaging, education on posture, core, use of back braces (discouraged).     01/27/2023 Therapeutic Exercise: to improve strength and mobility.  Demo, verbal and tactile cues throughout for technique. Nustep L5 x 6 min  Seated chin tucks Seated levator and SCM stretch Seated shoulder rolls retro Seated scap squeeze Seated bil ER with scap squeeze RTB x 15 Supine PPT - needed a lot of tactile cueing to relax upper body and legs to isolate low back and engage abs Supine PPT with march Manual Therapy: to decrease muscle spasm and pain and improve mobility Muscle energy technique R hip extension to correct SIJ, negative supine to sit test following.  STM/TPR to bil UT, R glutes/piriformis and SIJ, skilled palpation and monitoring during dry needling. Trigger Point Dry-Needling  Treatment instructions: Expect mild to moderate muscle soreness. S/S of pneumothorax if dry needled over a lung field, and to seek immediate medical attention should they occur. Patient verbalized understanding of these instructions and education. Patient Consent Given: Yes Education handout provided: Previously provided Muscles treated: R piriformis, bil L4/5 multifidi, bil UT Electrical  stimulation performed: No Parameters: N/A Treatment response/outcome: Twitch Response Elicited and Palpable Increase in Muscle Length   PATIENT EDUCATION:  Education details: HEP update Person educated: Patient Education method: Explanation Education comprehension: verbalized understanding  HOME EXERCISE PROGRAM: Access Code: [email or web address] URL: [email or web address] Date: 02/03/2023 Prepared by: Harrie Foreman  Exercises - Seated Cervical Retraction  - 3 x daily - 7 x weekly - 1 sets - 10 reps - 5 sec  hold - Seated Scapular Retraction  - 1 x daily - 7 x weekly - 1 sets - 10 reps - 5 sec  hold - Shoulder External Rotation and Scapular Retraction with Resistance  - 1 x daily - 7 x weekly - 2 sets - 10 reps -  Gentle Levator Scapulae Stretch  - 1 x daily - 7 x weekly - 1 sets - 3 reps - 10-15 hold - Sternocleidomastoid Stretch  - 1 x daily - 7 x weekly - 1 sets - 3 reps - 10-15 sec  hold - Supine Posterior Pelvic Tilt  - 1 x daily - 7 x weekly - 2 sets - 10 reps - Supine March  - 1 x daily - 7 x weekly - 2 sets - 10 reps - Supine Hip Adduction Isometric with Ball  - 1 x daily - 7 x weekly - 1 sets - 10 reps - 10 sec hold - Sidelying Open Book  - 1 x daily - 7 x weekly - 1 sets - 10 reps - Supine Bridge  - 1 x daily - 7 x weekly - 3 sets - 10 reps - Seated Shoulder Flexion Towel Slide at Table Top  - 1 x daily - 7 x weekly - 3 sets - 10 reps - Seated Shoulder External Rotation PROM on Table  - 1 x daily - 7 x weekly - 3 sets - 10 reps - Isometric Shoulder External Rotation  - 3 x daily - 7 x weekly - 1 sets - 5 reps - 5 sec  hold - Isometric Shoulder Internal Rotation  - 3 x daily - 7 x weekly - 1 sets - 5 reps - 5 sec  hold - Isometric Shoulder Flexion  - 3 x daily - 7 x weekly - 1 sets - 5 reps - 5 sec  hold  ASSESSMENT:  CLINICAL IMPRESSION: Jenna Arroyo reported some soreness from TrDN but otherwise good response, she got her x-ray results which just showed mild  arthritic changes, reviewed images with her and encouraged to continue working on posture and core stabilization.  Noted R shoulder pain very limiting today, discussed importance of not pushing through pain and causing more irritation, given table slides and isometric exercises for shoulder, as well as progression of core strengthening exercises, very challenged with bridges due to core weakness. Also discussed choice of equipment at [employer].   Jenna Arroyo continues to demonstrate potential for improvement and would benefit from continued skilled therapy to address impairments.       OBJECTIVE IMPAIRMENTS: decreased activity tolerance, decreased ROM, hypomobility, increased fascial restrictions, impaired perceived functional ability, increased muscle spasms, impaired flexibility, impaired UE functional use, improper body mechanics, postural dysfunction, and pain.   ACTIVITY LIMITATIONS: carrying, lifting, bending, sitting, reach over head, locomotion level, and caring for others  PARTICIPATION LIMITATIONS: meal prep, cleaning, laundry, driving, shopping, community activity, occupation, and yard work  PERSONAL FACTORS: Past/current experiences, Time since onset of injury/illness/exacerbation, and 1-2 comorbidities: History of migraines, hypothyroidism, lyme disease, HTN.   are also affecting patient's functional outcome.   REHAB POTENTIAL: Good  CLINICAL DECISION MAKING: Evolving/moderate complexity  EVALUATION COMPLEXITY: Moderate   GOALS: Goals reviewed with patient? Yes  SHORT TERM GOALS: Target date: 02/08/2023   Patient will be independent with initial HEP.  Baseline: needs Goal status: MET 02/03/23 - met  2.  Patient will demonstrate negative supine to long sit test Baseline: positive on right Goal status: MET 01/27/23   LONG TERM GOALS: Target date: 03/22/2023   Patient will be independent with advanced/ongoing HEP to improve outcomes and carryover.  Baseline:  Goal  status: IN PROGRESS  2.  Patient will report 75% improvement in neck/upper back pain to improve QOL.  Baseline:  Goal status: IN PROGRESS  3.  Patient will report at least 7 points improvement on NDI to demonstrate improved functional ability.  Baseline: 14/50 Goal status: IN PROGRESS  4.  Patient will report 75% improvement in low back pain to improve QOL Baseline:  Goal status: IN PROGRESS  5. Patient will demonstrate full lumbar AROM without pain to complete ADLs   Baseline: see objective Goal status: IN PROGRESS   6. Patient will demonstrate full R shoulder AROM without pain to complete ADLs.   Baseline: see objective Goal status: IN PROGRESS    PLAN:  PT FREQUENCY: 1-2x/week  PT DURATION: 8 weeks  PLANNED INTERVENTIONS: Therapeutic exercises, Therapeutic activity, Neuromuscular re-education, Balance training, Gait training, Patient/Family education, Self Care, Joint mobilization, Joint manipulation, Dry Needling, Spinal manipulation, Spinal mobilization, Cryotherapy, Moist heat, Taping, Ultrasound, Manual therapy, and Re-evaluation  PLAN FOR NEXT SESSION: Approved for 3 more visits, so emphasis on independence and self-management.   Interested in [place] [facility]   Jena Gauss, PT, DPT  02/03/2023, 10:45 AM

## 2023-02-10 ENCOUNTER — Encounter: Payer: Medicaid Other | Admitting: Physical Therapy

## 2023-02-11 ENCOUNTER — Ambulatory Visit: Payer: Medicaid Other | Admitting: Psychiatry

## 2023-02-12 ENCOUNTER — Ambulatory Visit: Payer: Medicaid Other | Attending: Family Medicine | Admitting: Physical Therapy

## 2023-02-12 ENCOUNTER — Encounter: Payer: Self-pay | Admitting: Physical Therapy

## 2023-02-12 DIAGNOSIS — G8929 Other chronic pain: Secondary | ICD-10-CM | POA: Diagnosis not present

## 2023-02-12 DIAGNOSIS — M5459 Other low back pain: Secondary | ICD-10-CM | POA: Diagnosis not present

## 2023-02-12 DIAGNOSIS — R293 Abnormal posture: Secondary | ICD-10-CM

## 2023-02-12 DIAGNOSIS — R252 Cramp and spasm: Secondary | ICD-10-CM

## 2023-02-12 DIAGNOSIS — M542 Cervicalgia: Secondary | ICD-10-CM

## 2023-02-12 DIAGNOSIS — M25511 Pain in right shoulder: Secondary | ICD-10-CM | POA: Diagnosis not present

## 2023-02-12 DIAGNOSIS — M546 Pain in thoracic spine: Secondary | ICD-10-CM

## 2023-02-12 NOTE — Therapy (Signed)
OUTPATIENT PHYSICAL THERAPY EVALUATION   Patient Name: Jenna Arroyo MRN: [id number] DOB:1965/10/04, 57 y.o., female Today's Date: 02/12/2023  END OF SESSION:  PT End of Session - 02/12/23 1107     Visit Number 4    Date for PT Re-Evaluation 03/22/23    Authorization Type [facility] Managed Medicaid    Authorization Time Period 01/25/23-03/25/23    Authorization - Visit Number 3    Authorization - Number of Visits 5    PT Start Time 1102    PT Stop Time 1203    PT Time Calculation (min) 61 min    Activity Tolerance Patient tolerated treatment well    Behavior During Therapy [facility] for tasks assessed/performed             Past Medical History:  Diagnosis Date   Anxiety    High cholesterol    Hypertension    Lyme disease    Thyroid disease    Past Surgical History:  Procedure Laterality Date   ABDOMINAL EXPLORATION SURGERY     APPENDECTOMY     MASTOIDECTOMY     TONSILECTOMY/ADENOIDECTOMY WITH MYRINGOTOMY     Patient Active Problem List   Diagnosis Date Noted   Cervical spine pain 12/08/2022   Chronic midline thoracic back pain 12/08/2022   Onychomycosis of great toe 12/08/2022   B12 deficiency 12/08/2022   Other fatigue 12/08/2022   Intertrigo 12/08/2022   Cervical strain 02/18/2021   Dyspepsia 02/18/2021   Urinary frequency 12/06/2020   Hyponatremia 09/23/2020   Anemia 06/14/2020   Nausea 04/05/2020   History of panic attacks 01/23/2019   GAD (generalized anxiety disorder) 04/22/2018   ADD (attention deficit disorder) 04/22/2018   Pharyngitis 12/11/2016   Axillary lymphadenopathy 12/11/2016   Lyme disease 12/11/2016   Dyslipidemia 06/18/2016   Essential hypertension 06/18/2016   Hypothyroidism 06/18/2016    PCP: Donato Schultz, DO   REFERRING PROVIDER: Donato Schultz, *   REFERRING DIAG:  M54.2 (ICD-10-CM) - Neck pain  M54.6,G89.29 (ICD-10-CM) - Chronic midline thoracic back pain    THERAPY DIAG:  Pain in thoracic spine  Other  low back pain  Cervicalgia  Chronic right shoulder pain  Abnormal posture  Cramp and spasm  Rationale for Evaluation and Treatment: Rehabilitation  ONSET DATE: 1.5 years ago  SUBJECTIVE:                                                                                                                                                                                                         SUBJECTIVE STATEMENT:  8/9/2024Micah Flesher to [employer], equipment didn't agree with her, so "degenerative disks" are hurting (low back).     From IE: Patient reports history of upper and lower back pain that has been ongoing for about a year and a half after falling down stairs while wearing flip flops. She hit her head but was able to get up and walk two miles afterwards.  She also has history of migraines.   The pain comes and goes, gets pain if sits too long, its uncomfortable.  Pain is from neck, middle back to lower back.  She also has R shoulder pain for about a year.  She has been going through a lot right now after death of mother, losing home, and currently between jobs.   Hand dominance: Right  PERTINENT HISTORY:  History of migraines, hypothyroidism, lyme disease, HTN.   PAIN:  Are you having pain? Yes: NPRS scale: 3/10 Pain location: low back   PRECAUTIONS: None  RED FLAGS: None   WEIGHT BEARING RESTRICTIONS: No  FALLS:  Has patient fallen in last 6 months? No  LIVING ENVIRONMENT: Lives with:  lives with family friend Lives in: House/apartment Stairs: Yes: Internal: 14 steps; on right going up, on left going up, and can reach both and External: 12 steps; on right going up, on left going up, and can reach both Has following equipment at home: None  OCCUPATION: in between jobs currently  PLOF: Independent and Leisure: gardening, taking walks, fish, kayak  PATIENT GOALS: "getting the most healthy spine that I can have and have less pain"  NEXT MD VISIT: 01/28/23  OBJECTIVE:    DIAGNOSTIC FINDINGS:  Imaging taken on 01/25/23, results not available yet.    PATIENT SURVEYS:  NDI 14/50 = 28% mild  COGNITION: Overall cognitive status: Within functional limits for tasks assessed  SENSATION: WFL  POSTURE: rounded shoulders and forward head R shoulder anterior  PALPATION: Diffuse tenderness throughout cervical and thoracic spine,  hypomobility throughout cervical, thoracic and lumbar spine, tenderness bil erector spinae, bil QL, bil SIJ, R piriformis.     CERVICAL ROM:   Active ROM A/PROM (deg) eval  Flexion 30  Extension 50  Right lateral flexion 22  Left lateral flexion 28  Right rotation 60  Left rotation 65   (Blank rows = not tested)  UPPER EXTREMITY ROM: 4 inches difference between functional internal rotation on R compared to left with reaching behind back*  Active ROM Right eval Left eval  Shoulder flexion 145 160  Shoulder extension 55 55  Shoulder abduction 140p! 170  Shoulder internal rotation 65 80  Shoulder external rotation 35p! 85   (Blank rows = not tested)  UPPER EXTREMITY MMT:    MMT Right eval Left eval  Shoulder flexion 5 5  Shoulder extension    Shoulder abduction 5 5  Shoulder internal rotation 5 5  Shoulder external rotation 5 5  Elbow flexion 5 5  Elbow extension 5 5  Wrist flexion 5 5  Wrist extension 5 5  Grip strength good good   (Blank rows = not tested)  LOWER EXTREMITY MMT:    MMT Right eval Left eval  Hip flexion 5 5  Hip extension    Hip abduction 5 5  Hip adduction 5 5  Knee flexion 5 5  Knee extension 5 5  Ankle dorsiflexion 5 5  Ankle plantarflexion 5 5   (Blank rows = not tested)  LUMBAR ROM:   Active  AROM  eval  Flexion  To ankles  Extension WNL  Right lateral flexion To knee, inc pain  Left lateral flexion To knee, inc pain  Right rotation 50%, inc pain  Left rotation 50%, inc pain   (Blank rows = not tested)    CERVICAL SPECIAL TESTS:  Spurling's test: Negative  LUMBAR  SPECIAL TESTS:  Straight leg raise test: Negative, FABER test: Negative, and Long sit test: Positive R leg longer sitting  FUNCTIONAL TESTS:  5 times sit to stand: 10 seconds without UE assist.   TODAY'S TREATMENT:                                                                                                                              DATE:   02/12/2023 Therapeutic Exercise: to improve strength and mobility.  Demo, verbal and tactile cues throughout for technique. Bike L2 x 6 min  Cat cows x 10 with push-up handles  Child pose - limited knee flexion in this position  Quadruped leg extension - too much pressure on R shoulder Prone leg extension - tolerated better but rapid fatigue - 2 x 10  Prone quad stretch 2 x 30 sec with strap  PPT x 10  Supine SLR x 10 bil  Bridges x 10  Manual Therapy: to decrease muscle spasm and pain and improve mobility STM/TPR to lumbar paraspinals, skilled palpation and monitoring during dry needling. Trigger Point Dry-Needling  Treatment instructions: Expect mild to moderate muscle soreness. S/S of pneumothorax if dry needled over a lung field, and to seek immediate medical attention should they occur. Patient verbalized understanding of these instructions and education. Patient Consent Given: Yes Education handout provided: Previously provided Muscles treated: bil lumbar multifidi L2-5 Electrical stimulation performed: No Parameters: N/A Treatment response/outcome: Twitch Response Elicited and Palpable Increase in Muscle Length Self Care: Information on TENS   02/03/23 Therapeutic Exercise: to improve strength and mobility.  Demo, verbal and tactile cues throughout for technique.  Nustep L5 x 6 min Open books x 10 each side On pool noodle: Pec stretch Open book Back stroke Bridges with TrA x 10 - challenging Isometric shoulder exercises -R - flexion, extension, ER and IR Table slides and shoulder ER - demo only Self Care: Review of imaging,  education on posture, core, use of back braces (discouraged).     01/27/2023 Therapeutic Exercise: to improve strength and mobility.  Demo, verbal and tactile cues throughout for technique. Nustep L5 x 6 min  Seated chin tucks Seated levator and SCM stretch Seated shoulder rolls retro Seated scap squeeze Seated bil ER with scap squeeze RTB x 15 Supine PPT - needed a lot of tactile cueing to relax upper body and legs to isolate low back and engage abs Supine PPT with march Manual Therapy: to decrease muscle spasm and pain and improve mobility Muscle energy technique R hip extension to correct SIJ, negative supine to sit test following.  STM/TPR to bil UT, R glutes/piriformis and SIJ, skilled palpation  and monitoring during dry needling. Trigger Point Dry-Needling  Treatment instructions: Expect mild to moderate muscle soreness. S/S of pneumothorax if dry needled over a lung field, and to seek immediate medical attention should they occur. Patient verbalized understanding of these instructions and education. Patient Consent Given: Yes Education handout provided: Previously provided Muscles treated: R piriformis, bil L4/5 multifidi, bil UT Electrical stimulation performed: No Parameters: N/A Treatment response/outcome: Twitch Response Elicited and Palpable Increase in Muscle Length   PATIENT EDUCATION:  Education details: HEP update Person educated: Patient Education method: Explanation, Demonstration, Verbal cues, and Handouts Education comprehension: verbalized understanding and returned demonstration  HOME EXERCISE PROGRAM: Access Code: [email or web address] URL: [email or web address] Date: 02/12/2023 Prepared by: Harrie Foreman  Exercises - Seated Cervical Retraction  - 3 x daily - 7 x weekly - 1 sets - 10 reps - 5 sec  hold - Seated Scapular Retraction  - 1 x daily - 7 x weekly - 1 sets - 10 reps - 5 sec  hold - Shoulder External Rotation and Scapular Retraction with  Resistance  - 1 x daily - 7 x weekly - 2 sets - 10 reps - Gentle Levator Scapulae Stretch  - 1 x daily - 7 x weekly - 1 sets - 3 reps - 10-15 hold - Sternocleidomastoid Stretch  - 1 x daily - 7 x weekly - 1 sets - 3 reps - 10-15 sec  hold - Supine Posterior Pelvic Tilt  - 1 x daily - 7 x weekly - 2 sets - 10 reps - Supine March  - 1 x daily - 7 x weekly - 2 sets - 10 reps - Supine Hip Adduction Isometric with Ball  - 1 x daily - 7 x weekly - 1 sets - 10 reps - 10 sec hold - Sidelying Open Book  - 1 x daily - 7 x weekly - 1 sets - 10 reps - Supine Bridge  - 1 x daily - 7 x weekly - 3 sets - 10 reps - Seated Shoulder Flexion Towel Slide at Table Top  - 1 x daily - 7 x weekly - 3 sets - 10 reps - Seated Shoulder External Rotation PROM on Table  - 1 x daily - 7 x weekly - 3 sets - 10 reps - Isometric Shoulder External Rotation  - 3 x daily - 7 x weekly - 1 sets - 5 reps - 5 sec  hold - Isometric Shoulder Internal Rotation  - 3 x daily - 7 x weekly - 1 sets - 5 reps - 5 sec  hold - Isometric Shoulder Flexion  - 3 x daily - 7 x weekly - 1 sets - 5 reps - 5 sec  hold - Beginner Prone Single Leg Raise  - 1 x daily - 7 x weekly - 3 sets - 10 reps - Prone Quadriceps Stretch with Strap  - 1 x daily - 7 x weekly - 3 sets - 10 reps - Supine Active Straight Leg Raise  - 1 x daily - 7 x weekly - 3 sets - 10 reps  ASSESSMENT:  CLINICAL IMPRESSION: VIVIANNE DRESCHER went to [employer] but reported increased LBP afterwards from the machines, discussed trying recumbent bike instead of elliptical.  Continued exercises for core and hip strengthening, challenged with quadruped positioning due to wrist and R shoulder pain, also had hamstring cramps when tried bent knee hip extension.  Noted tightness bil quads, so added stretch to HEP as well.  Educated on diet and  exercise throughout session while exercising.  Manual therapy including TrDN at end of session with good response.   ANNELIS SPEAKER continues to demonstrate  potential for improvement and would benefit from continued skilled therapy to address impairments.   She plans to get TENS so will instruct in care in next visit.       OBJECTIVE IMPAIRMENTS: decreased activity tolerance, decreased ROM, hypomobility, increased fascial restrictions, impaired perceived functional ability, increased muscle spasms, impaired flexibility, impaired UE functional use, improper body mechanics, postural dysfunction, and pain.   ACTIVITY LIMITATIONS: carrying, lifting, bending, sitting, reach over head, locomotion level, and caring for others  PARTICIPATION LIMITATIONS: meal prep, cleaning, laundry, driving, shopping, community activity, occupation, and yard work  PERSONAL FACTORS: Past/current experiences, Time since onset of injury/illness/exacerbation, and 1-2 comorbidities: History of migraines, hypothyroidism, lyme disease, HTN.   are also affecting patient's functional outcome.   REHAB POTENTIAL: Good  CLINICAL DECISION MAKING: Evolving/moderate complexity  EVALUATION COMPLEXITY: Moderate   GOALS: Goals reviewed with patient? Yes  SHORT TERM GOALS: Target date: 02/08/2023   Patient will be independent with initial HEP.  Baseline: needs Goal status: MET 02/03/23 - met  2.  Patient will demonstrate negative supine to long sit test Baseline: positive on right Goal status: MET 01/27/23   LONG TERM GOALS: Target date: 03/22/2023   Patient will be independent with advanced/ongoing HEP to improve outcomes and carryover.  Baseline:  Goal status: IN PROGRESS  2.  Patient will report 75% improvement in neck/upper back pain to improve QOL.  Baseline:  Goal status: IN PROGRESS  3.  Patient will report at least 7 points improvement on NDI to demonstrate improved functional ability.  Baseline: 14/50 Goal status: IN PROGRESS  4.  Patient will report 75% improvement in low back pain to improve QOL Baseline:  Goal status: IN PROGRESS  5. Patient will  demonstrate full lumbar AROM without pain to complete ADLs   Baseline: see objective Goal status: IN PROGRESS   6. Patient will demonstrate full R shoulder AROM without pain to complete ADLs.   Baseline: see objective Goal status: IN PROGRESS    PLAN:  PT FREQUENCY: 1-2x/week  PT DURATION: 8 weeks - modified due to insurance limitations, only 5 visits approved.   PLANNED INTERVENTIONS: Therapeutic exercises, Therapeutic activity, Neuromuscular re-education, Balance training, Gait training, Patient/Family education, Self Care, Joint mobilization, Joint manipulation, Dry Needling, Spinal manipulation, Spinal mobilization, Cryotherapy, Moist heat, Taping, Ultrasound, Manual therapy, and Re-evaluation  PLAN FOR NEXT SESSION: Approved for only 2 more visits, so emphasis on independence and self-management.   Interested in [place] [facility] [employer]   Jena Gauss, PT, DPT  02/12/2023, 12:12 PM

## 2023-02-15 ENCOUNTER — Ambulatory Visit: Payer: Medicaid Other

## 2023-02-22 ENCOUNTER — Ambulatory Visit: Payer: Medicaid Other | Admitting: Physical Therapy

## 2023-02-24 ENCOUNTER — Encounter: Payer: Medicaid Other | Admitting: Physical Therapy

## 2023-03-01 ENCOUNTER — Encounter: Payer: Self-pay | Admitting: Physical Therapy

## 2023-03-01 ENCOUNTER — Ambulatory Visit: Payer: Medicaid Other | Admitting: Physical Therapy

## 2023-03-01 DIAGNOSIS — M5459 Other low back pain: Secondary | ICD-10-CM | POA: Diagnosis not present

## 2023-03-01 DIAGNOSIS — G8929 Other chronic pain: Secondary | ICD-10-CM

## 2023-03-01 DIAGNOSIS — M542 Cervicalgia: Secondary | ICD-10-CM | POA: Diagnosis not present

## 2023-03-01 DIAGNOSIS — R252 Cramp and spasm: Secondary | ICD-10-CM

## 2023-03-01 DIAGNOSIS — R293 Abnormal posture: Secondary | ICD-10-CM

## 2023-03-01 DIAGNOSIS — M25511 Pain in right shoulder: Secondary | ICD-10-CM | POA: Diagnosis not present

## 2023-03-01 DIAGNOSIS — M546 Pain in thoracic spine: Secondary | ICD-10-CM | POA: Diagnosis not present

## 2023-03-01 NOTE — Therapy (Signed)
OUTPATIENT PHYSICAL THERAPY EVALUATION   Patient Name: Jenna Arroyo MRN: [id number] DOB:March 27, 1966, 57 y.o., female Today's Date: 03/01/2023  END OF SESSION:  PT End of Session - 03/01/23 1450     Visit Number 5    Date for PT Re-Evaluation 03/22/23    Authorization Type [facility] Managed Medicaid    Authorization Time Period 01/25/23-03/25/23    Authorization - Visit Number 4    Authorization - Number of Visits 5    PT Start Time 1448    PT Stop Time 1535    PT Time Calculation (min) 47 min    Activity Tolerance Patient tolerated treatment well    Behavior During Therapy [facility] for tasks assessed/performed             Past Medical History:  Diagnosis Date   Anxiety    High cholesterol    Hypertension    Lyme disease    Thyroid disease    Past Surgical History:  Procedure Laterality Date   ABDOMINAL EXPLORATION SURGERY     APPENDECTOMY     MASTOIDECTOMY     TONSILECTOMY/ADENOIDECTOMY WITH MYRINGOTOMY     Patient Active Problem List   Diagnosis Date Noted   Cervical spine pain 12/08/2022   Chronic midline thoracic back pain 12/08/2022   Onychomycosis of great toe 12/08/2022   B12 deficiency 12/08/2022   Other fatigue 12/08/2022   Intertrigo 12/08/2022   Cervical strain 02/18/2021   Dyspepsia 02/18/2021   Urinary frequency 12/06/2020   Hyponatremia 09/23/2020   Anemia 06/14/2020   Nausea 04/05/2020   History of panic attacks 01/23/2019   GAD (generalized anxiety disorder) 04/22/2018   ADD (attention deficit disorder) 04/22/2018   Pharyngitis 12/11/2016   Axillary lymphadenopathy 12/11/2016   Lyme disease 12/11/2016   Dyslipidemia 06/18/2016   Essential hypertension 06/18/2016   Hypothyroidism 06/18/2016    PCP: Donato Schultz, DO   REFERRING PROVIDER: Donato Schultz, *   REFERRING DIAG:  M54.2 (ICD-10-CM) - Neck pain  M54.6,G89.29 (ICD-10-CM) - Chronic midline thoracic back pain    THERAPY DIAG:  Pain in thoracic spine  Other  low back pain  Cervicalgia  Chronic right shoulder pain  Abnormal posture  Cramp and spasm  Rationale for Evaluation and Treatment: Rehabilitation  ONSET DATE: 1.5 years ago  SUBJECTIVE:                                                                                                                                                                                                         SUBJECTIVE STATEMENT:  03/01/23 - has gotten TENS unit recommended but forgot to bring it.  Overall feeling pretty good.  Doesn't feel like the machines are good at [facility], but can still go there to walk and use massage machines.   From IE: Patient reports history of upper and lower back pain that has been ongoing for about a year and a half after falling down stairs while wearing flip flops. She hit her head but was able to get up and walk two miles afterwards.  She also has history of migraines.   The pain comes and goes, gets pain if sits too long, its uncomfortable.  Pain is from neck, middle back to lower back.  She also has R shoulder pain for about a year.  She has been going through a lot right now after death of mother, losing home, and currently between jobs.   Hand dominance: Right  PERTINENT HISTORY:  History of migraines, hypothyroidism, lyme disease, HTN.   PAIN:  Are you having pain? Yes: NPRS scale: 3/10 Pain location: low back   PRECAUTIONS: None  RED FLAGS: None   WEIGHT BEARING RESTRICTIONS: No  FALLS:  Has patient fallen in last 6 months? No  LIVING ENVIRONMENT: Lives with:  lives with family friend Lives in: House/apartment Stairs: Yes: Internal: 14 steps; on right going up, on left going up, and can reach both and External: 12 steps; on right going up, on left going up, and can reach both Has following equipment at home: None  OCCUPATION: in between jobs currently  PLOF: Independent and Leisure: gardening, taking walks, fish, kayak  PATIENT GOALS: "getting the most healthy  spine that I can have and have less pain"  NEXT MD VISIT: 01/28/23  OBJECTIVE:   DIAGNOSTIC FINDINGS:  Imaging taken on 01/25/23, results not available yet.    PATIENT SURVEYS:  NDI 14/50 = 28% mild  COGNITION: Overall cognitive status: Within functional limits for tasks assessed  SENSATION: WFL  POSTURE: rounded shoulders and forward head R shoulder anterior  PALPATION: Diffuse tenderness throughout cervical and thoracic spine,  hypomobility throughout cervical, thoracic and lumbar spine, tenderness bil erector spinae, bil QL, bil SIJ, R piriformis.     CERVICAL ROM:   Active ROM A/PROM (deg) eval  Flexion 30  Extension 50  Right lateral flexion 22  Left lateral flexion 28  Right rotation 60  Left rotation 65   (Blank rows = not tested)  UPPER EXTREMITY ROM: 4 inches difference between functional internal rotation on R compared to left with reaching behind back*  Active ROM Right eval Left eval  Shoulder flexion 145 160  Shoulder extension 55 55  Shoulder abduction 140p! 170  Shoulder internal rotation 65 80  Shoulder external rotation 35p! 85   (Blank rows = not tested)  UPPER EXTREMITY MMT:    MMT Right eval Left eval  Shoulder flexion 5 5  Shoulder extension    Shoulder abduction 5 5  Shoulder internal rotation 5 5  Shoulder external rotation 5 5  Elbow flexion 5 5  Elbow extension 5 5  Wrist flexion 5 5  Wrist extension 5 5  Grip strength good good   (Blank rows = not tested)  LOWER EXTREMITY MMT:    MMT Right eval Left eval  Hip flexion 5 5  Hip extension    Hip abduction 5 5  Hip adduction 5 5  Knee flexion 5 5  Knee extension 5 5  Ankle dorsiflexion 5 5  Ankle plantarflexion 5 5   (  Blank rows = not tested)  LUMBAR ROM:   Active  AROM  eval  Flexion To ankles  Extension WNL  Right lateral flexion To knee, inc pain  Left lateral flexion To knee, inc pain  Right rotation 50%, inc pain  Left rotation 50%, inc pain   (Blank  rows = not tested)    CERVICAL SPECIAL TESTS:  Spurling's test: Negative  LUMBAR SPECIAL TESTS:  Straight leg raise test: Negative, FABER test: Negative, and Long sit test: Positive R leg longer sitting  FUNCTIONAL TESTS:  5 times sit to stand: 10 seconds without UE assist.   TODAY'S TREATMENT:                                                                                                                              DATE:  03/01/23 Self Care:  education on TENS set up, precautions, electrode placement, electrode care, how to select and change channels, modes, time, etc.  Pads applied to both neck and back today and patient had opportunity to try different setting until comfortable with use of machine.   Manual Therapy: to decrease muscle spasm and pain and improve mobility STM/TPR to bil UT, L/S and cervical paraspinals, UPA mobs cervical spine, skilled palpation and monitoring during dry needling. Trigger Point Dry-Needling  Treatment instructions: Expect mild to moderate muscle soreness. S/S of pneumothorax if dry needled over a lung field, and to seek immediate medical attention should they occur. Patient verbalized understanding of these instructions and education. Patient Consent Given: Yes Education handout provided: Previously provided Muscles treated: bil UT & L/S Electrical stimulation performed: No Parameters: N/A Treatment response/outcome: Twitch Response Elicited and Palpable Increase in Muscle Length   02/12/2023 Therapeutic Exercise: to improve strength and mobility.  Demo, verbal and tactile cues throughout for technique. Bike L2 x 6 min  Cat cows x 10 with push-up handles  Child pose - limited knee flexion in this position  Quadruped leg extension - too much pressure on R shoulder Prone leg extension - tolerated better but rapid fatigue - 2 x 10  Prone quad stretch 2 x 30 sec with strap  PPT x 10  Supine SLR x 10 bil  Bridges x 10  Manual Therapy: to decrease muscle  spasm and pain and improve mobility STM/TPR to lumbar paraspinals, skilled palpation and monitoring during dry needling. Trigger Point Dry-Needling  Treatment instructions: Expect mild to moderate muscle soreness. S/S of pneumothorax if dry needled over a lung field, and to seek immediate medical attention should they occur. Patient verbalized understanding of these instructions and education. Patient Consent Given: Yes Education handout provided: Previously provided Muscles treated: bil lumbar multifidi L2-5 Electrical stimulation performed: No Parameters: N/A Treatment response/outcome: Twitch Response Elicited and Palpable Increase in Muscle Length Self Care: Information on TENS   02/03/23 Therapeutic Exercise: to improve strength and mobility.  Demo, verbal and tactile cues throughout for technique.  Nustep L5 x 6 min Open  books x 10 each side On pool noodle: Pec stretch Open book Back stroke Bridges with TrA x 10 - challenging Isometric shoulder exercises -R - flexion, extension, ER and IR Table slides and shoulder ER - demo only Self Care: Review of imaging, education on posture, core, use of back braces (discouraged).      PATIENT EDUCATION:  Education details: TENS Person educated: Patient Education method: Explanation, Demonstration, and Verbal cues Education comprehension: verbalized understanding and returned demonstration  HOME EXERCISE PROGRAM: Access Code: [email or web address] URL: [email or web address] Date: 02/12/2023 Prepared by: Harrie Foreman  Exercises - Seated Cervical Retraction  - 3 x daily - 7 x weekly - 1 sets - 10 reps - 5 sec  hold - Seated Scapular Retraction  - 1 x daily - 7 x weekly - 1 sets - 10 reps - 5 sec  hold - Shoulder External Rotation and Scapular Retraction with Resistance  - 1 x daily - 7 x weekly - 2 sets - 10 reps - Gentle Levator Scapulae Stretch  - 1 x daily - 7 x weekly - 1 sets - 3 reps - 10-15 hold - Sternocleidomastoid  Stretch  - 1 x daily - 7 x weekly - 1 sets - 3 reps - 10-15 sec  hold - Supine Posterior Pelvic Tilt  - 1 x daily - 7 x weekly - 2 sets - 10 reps - Supine March  - 1 x daily - 7 x weekly - 2 sets - 10 reps - Supine Hip Adduction Isometric with Ball  - 1 x daily - 7 x weekly - 1 sets - 10 reps - 10 sec hold - Sidelying Open Book  - 1 x daily - 7 x weekly - 1 sets - 10 reps - Supine Bridge  - 1 x daily - 7 x weekly - 3 sets - 10 reps - Seated Shoulder Flexion Towel Slide at Table Top  - 1 x daily - 7 x weekly - 3 sets - 10 reps - Seated Shoulder External Rotation PROM on Table  - 1 x daily - 7 x weekly - 3 sets - 10 reps - Isometric Shoulder External Rotation  - 3 x daily - 7 x weekly - 1 sets - 5 reps - 5 sec  hold - Isometric Shoulder Internal Rotation  - 3 x daily - 7 x weekly - 1 sets - 5 reps - 5 sec  hold - Isometric Shoulder Flexion  - 3 x daily - 7 x weekly - 1 sets - 5 reps - 5 sec  hold - Beginner Prone Single Leg Raise  - 1 x daily - 7 x weekly - 3 sets - 10 reps - Prone Quadriceps Stretch with Strap  - 1 x daily - 7 x weekly - 3 sets - 10 reps - Supine Active Straight Leg Raise  - 1 x daily - 7 x weekly - 3 sets - 10 reps  ASSESSMENT:  CLINICAL IMPRESSION: Jenna Arroyo reports minimal pain today.  She did receive her TENS, so spent most of session reviewing set up until comfortable using at home.  Since next session is last approved, discussed working on good home gym program focusing on body weight exercises since she does not like gym equipment.   Jenna Arroyo continues to demonstrate potential for improvement and would benefit from continued skilled therapy to address impairments.       OBJECTIVE IMPAIRMENTS: decreased activity tolerance, decreased ROM, hypomobility, increased fascial restrictions, impaired perceived functional  ability, increased muscle spasms, impaired flexibility, impaired UE functional use, improper body mechanics, postural dysfunction, and pain.   ACTIVITY  LIMITATIONS: carrying, lifting, bending, sitting, reach over head, locomotion level, and caring for others  PARTICIPATION LIMITATIONS: meal prep, cleaning, laundry, driving, shopping, community activity, occupation, and yard work  PERSONAL FACTORS: Past/current experiences, Time since onset of injury/illness/exacerbation, and 1-2 comorbidities: History of migraines, hypothyroidism, lyme disease, HTN.   are also affecting patient's functional outcome.   REHAB POTENTIAL: Good  CLINICAL DECISION MAKING: Evolving/moderate complexity  EVALUATION COMPLEXITY: Moderate   GOALS: Goals reviewed with patient? Yes  SHORT TERM GOALS: Target date: 02/08/2023   Patient will be independent with initial HEP.  Baseline: needs Goal status: MET 02/03/23 - met  2.  Patient will demonstrate negative supine to long sit test Baseline: positive on right Goal status: MET 01/27/23   LONG TERM GOALS: Target date: 03/22/2023   Patient will be independent with advanced/ongoing HEP to improve outcomes and carryover.  Baseline:  Goal status: IN PROGRESS  2.  Patient will report 75% improvement in neck/upper back pain to improve QOL.  Baseline:  Goal status: IN PROGRESS  3.  Patient will report at least 7 points improvement on NDI to demonstrate improved functional ability.  Baseline: 14/50 Goal status: IN PROGRESS  4.  Patient will report 75% improvement in low back pain to improve QOL Baseline:  Goal status: IN PROGRESS  5. Patient will demonstrate full lumbar AROM without pain to complete ADLs   Baseline: see objective Goal status: IN PROGRESS   6. Patient will demonstrate full R shoulder AROM without pain to complete ADLs.   Baseline: see objective Goal status: IN PROGRESS    PLAN:  PT FREQUENCY: 1-2x/week  PT DURATION: 8 weeks - modified due to insurance limitations, only 5 visits approved.   PLANNED INTERVENTIONS: Therapeutic exercises, Therapeutic activity, Neuromuscular re-education,  Balance training, Gait training, Patient/Family education, Self Care, Joint mobilization, Joint manipulation, Dry Needling, Spinal manipulation, Spinal mobilization, Cryotherapy, Moist heat, Taping, Ultrasound, Manual therapy, and Re-evaluation  PLAN FOR NEXT SESSION: Approved for only 1 more visits, so emphasis on independence and self-management.      Jena Gauss, PT, DPT  03/01/2023, 5:39 PM

## 2023-03-02 ENCOUNTER — Ambulatory Visit: Payer: Medicaid Other | Admitting: Physical Therapy

## 2023-03-02 ENCOUNTER — Encounter: Payer: Self-pay | Admitting: Physical Therapy

## 2023-03-02 DIAGNOSIS — G8929 Other chronic pain: Secondary | ICD-10-CM | POA: Diagnosis not present

## 2023-03-02 DIAGNOSIS — M542 Cervicalgia: Secondary | ICD-10-CM

## 2023-03-02 DIAGNOSIS — M546 Pain in thoracic spine: Secondary | ICD-10-CM

## 2023-03-02 DIAGNOSIS — R252 Cramp and spasm: Secondary | ICD-10-CM | POA: Diagnosis not present

## 2023-03-02 DIAGNOSIS — R293 Abnormal posture: Secondary | ICD-10-CM | POA: Diagnosis not present

## 2023-03-02 DIAGNOSIS — M5459 Other low back pain: Secondary | ICD-10-CM

## 2023-03-02 DIAGNOSIS — M25511 Pain in right shoulder: Secondary | ICD-10-CM | POA: Diagnosis not present

## 2023-03-02 NOTE — Therapy (Addendum)
OUTPATIENT PHYSICAL THERAPY Treatment/Discharge Summary   Patient Name: Jenna Arroyo MRN: [id number] DOB:August 17, 1965, 57 y.o., female Today's Date: 03/02/2023  END OF SESSION:  PT End of Session - 03/02/23 0809     Visit Number 6    Date for PT Re-Evaluation 03/22/23    Authorization Type [facility] Managed Medicaid    Authorization Time Period 01/25/23-03/25/23    Authorization - Visit Number 5    Authorization - Number of Visits 5    PT Start Time 0805    PT Stop Time 0848    PT Time Calculation (min) 43 min    Activity Tolerance Patient tolerated treatment well    Behavior During Therapy [facility] for tasks assessed/performed             Past Medical History:  Diagnosis Date   Anxiety    High cholesterol    Hypertension    Lyme disease    Thyroid disease    Past Surgical History:  Procedure Laterality Date   ABDOMINAL EXPLORATION SURGERY     APPENDECTOMY     MASTOIDECTOMY     TONSILECTOMY/ADENOIDECTOMY WITH MYRINGOTOMY     Patient Active Problem List   Diagnosis Date Noted   Cervical spine pain 12/08/2022   Chronic midline thoracic back pain 12/08/2022   Onychomycosis of great toe 12/08/2022   B12 deficiency 12/08/2022   Other fatigue 12/08/2022   Intertrigo 12/08/2022   Cervical strain 02/18/2021   Dyspepsia 02/18/2021   Urinary frequency 12/06/2020   Hyponatremia 09/23/2020   Anemia 06/14/2020   Nausea 04/05/2020   History of panic attacks 01/23/2019   GAD (generalized anxiety disorder) 04/22/2018   ADD (attention deficit disorder) 04/22/2018   Pharyngitis 12/11/2016   Axillary lymphadenopathy 12/11/2016   Lyme disease 12/11/2016   Dyslipidemia 06/18/2016   Essential hypertension 06/18/2016   Hypothyroidism 06/18/2016    PCP: Donato Schultz, DO   REFERRING PROVIDER: Donato Schultz, *   REFERRING DIAG:  M54.2 (ICD-10-CM) - Neck pain  M54.6,G89.29 (ICD-10-CM) - Chronic midline thoracic back pain    THERAPY DIAG:  Pain in thoracic  spine  Other low back pain  Cervicalgia  Chronic right shoulder pain  Abnormal posture  Cramp and spasm  Rationale for Evaluation and Treatment: Rehabilitation  ONSET DATE: 1.5 years ago  SUBJECTIVE:                                                                                                                                                                                                         SUBJECTIVE  STATEMENT: 03/02/23- Doing well this morning.  Reports no pain and no soreness from TrDN  From IE: Patient reports history of upper and lower back pain that has been ongoing for about a year and a half after falling down stairs while wearing flip flops. She hit her head but was able to get up and walk two miles afterwards.  She also has history of migraines.   The pain comes and goes, gets pain if sits too long, its uncomfortable.  Pain is from neck, middle back to lower back.  She also has R shoulder pain for about a year.  She has been going through a lot right now after death of mother, losing home, and currently between jobs.   Hand dominance: Right  PERTINENT HISTORY:  History of migraines, hypothyroidism, lyme disease, HTN.   PAIN:  Are you having pain? Yes: NPRS scale: 0/10 Pain location: low back   PRECAUTIONS: None  RED FLAGS: None   WEIGHT BEARING RESTRICTIONS: No  FALLS:  Has patient fallen in last 6 months? No  LIVING ENVIRONMENT: Lives with:  lives with family friend Lives in: House/apartment Stairs: Yes: Internal: 14 steps; on right going up, on left going up, and can reach both and External: 12 steps; on right going up, on left going up, and can reach both Has following equipment at home: None  OCCUPATION: in between jobs currently  PLOF: Independent and Leisure: gardening, taking walks, fish, kayak  PATIENT GOALS: "getting the most healthy spine that I can have and have less pain"  NEXT MD VISIT: 01/28/23  OBJECTIVE:   DIAGNOSTIC FINDINGS:   Imaging taken on 01/25/23, results not available yet.    PATIENT SURVEYS:  NDI 14/50 = 28% mild  COGNITION: Overall cognitive status: Within functional limits for tasks assessed  SENSATION: WFL  POSTURE: rounded shoulders and forward head R shoulder anterior  PALPATION: Diffuse tenderness throughout cervical and thoracic spine,  hypomobility throughout cervical, thoracic and lumbar spine, tenderness bil erector spinae, bil QL, bil SIJ, R piriformis.     CERVICAL ROM:   Active ROM A/PROM (deg) eval  Flexion 30  Extension 50  Right lateral flexion 22  Left lateral flexion 28  Right rotation 60  Left rotation 65   (Blank rows = not tested)  UPPER EXTREMITY ROM: 4 inches difference between functional internal rotation on R compared to left with reaching behind back*  Active ROM Right eval Left eval  Shoulder flexion 145 160  Shoulder extension 55 55  Shoulder abduction 140p! 170  Shoulder internal rotation 65 80  Shoulder external rotation 35p! 85   (Blank rows = not tested)  UPPER EXTREMITY MMT:    MMT Right eval Left eval  Shoulder flexion 5 5  Shoulder extension    Shoulder abduction 5 5  Shoulder internal rotation 5 5  Shoulder external rotation 5 5  Elbow flexion 5 5  Elbow extension 5 5  Wrist flexion 5 5  Wrist extension 5 5  Grip strength good good   (Blank rows = not tested)  LOWER EXTREMITY MMT:    MMT Right eval Left eval  Hip flexion 5 5  Hip extension    Hip abduction 5 5  Hip adduction 5 5  Knee flexion 5 5  Knee extension 5 5  Ankle dorsiflexion 5 5  Ankle plantarflexion 5 5   (Blank rows = not tested)  LUMBAR ROM:   Active  AROM  eval  Flexion To ankles  Extension WNL  Right lateral flexion To knee, inc pain  Left lateral flexion To knee, inc pain  Right rotation 50%, inc pain  Left rotation 50%, inc pain   (Blank rows = not tested)    CERVICAL SPECIAL TESTS:  Spurling's test: Negative  LUMBAR SPECIAL TESTS:   Straight leg raise test: Negative, FABER test: Negative, and Long sit test: Positive R leg longer sitting  FUNCTIONAL TESTS:  5 times sit to stand: 10 seconds without UE assist.   TODAY'S TREATMENT:                                                                                                                              DATE:  03/02/23 Therapeutic Exercise: to improve strength and mobility.  Demo, verbal and tactile cues throughout for technique. Bike L2 x 5 min  Clock exercise x 5 each side  Squats - towel roll under feet Forward T-s - very difficult Bird dogs - had a lot of difficulty with sequence - push up bars for wrists - recommended breaking down into just leg extensions  and arm extensions at first Cat/cow and child pose Demo - thread the needle Review of therex for gym    03/01/23 Self Care:  education on TENS set up, precautions, electrode placement, electrode care, how to select and change channels, modes, time, etc.  Pads applied to both neck and back today and patient had opportunity to try different setting until comfortable with use of machine.   Manual Therapy: to decrease muscle spasm and pain and improve mobility STM/TPR to bil UT, L/S and cervical paraspinals, UPA mobs cervical spine, skilled palpation and monitoring during dry needling. Trigger Point Dry-Needling  Treatment instructions: Expect mild to moderate muscle soreness. S/S of pneumothorax if dry needled over a lung field, and to seek immediate medical attention should they occur. Patient verbalized understanding of these instructions and education. Patient Consent Given: Yes Education handout provided: Previously provided Muscles treated: bil UT & L/S Electrical stimulation performed: No Parameters: N/A Treatment response/outcome: Twitch Response Elicited and Palpable Increase in Muscle Length   02/12/2023 Therapeutic Exercise: to improve strength and mobility.  Demo, verbal and tactile cues throughout for  technique. Bike L2 x 6 min  Cat cows x 10 with push-up handles  Child pose - limited knee flexion in this position  Quadruped leg extension - too much pressure on R shoulder Prone leg extension - tolerated better but rapid fatigue - 2 x 10  Prone quad stretch 2 x 30 sec with strap  PPT x 10  Supine SLR x 10 bil  Bridges x 10  Manual Therapy: to decrease muscle spasm and pain and improve mobility STM/TPR to lumbar paraspinals, skilled palpation and monitoring during dry needling. Trigger Point Dry-Needling  Treatment instructions: Expect mild to moderate muscle soreness. S/S of pneumothorax if dry needled over a lung field, and to seek immediate medical attention should they occur. Patient verbalized understanding of these instructions  and education. Patient Consent Given: Yes Education handout provided: Previously provided Muscles treated: bil lumbar multifidi L2-5 Electrical stimulation performed: No Parameters: N/A Treatment response/outcome: Twitch Response Elicited and Palpable Increase in Muscle Length Self Care: Information on TENS   02/03/23 Therapeutic Exercise: to improve strength and mobility.  Demo, verbal and tactile cues throughout for technique.  Nustep L5 x 6 min Open books x 10 each side On pool noodle: Pec stretch Open book Back stroke Bridges with TrA x 10 - challenging Isometric shoulder exercises -R - flexion, extension, ER and IR Table slides and shoulder ER - demo only Self Care: Review of imaging, education on posture, core, use of back braces (discouraged).      PATIENT EDUCATION:  Education details: HEP update Person educated: Patient Education method: Explanation, Demonstration, and Verbal cues Education comprehension: verbalized understanding and returned demonstration  HOME EXERCISE PROGRAM: Access Code: [email or web address] URL: [email or web address] Date: 03/02/2023 Prepared by: Harrie Foreman  Exercises - Seated Cervical Retraction   - 3 x daily - 7 x weekly - 1 sets - 10 reps - 5 sec  hold - Seated Scapular Retraction  - 1 x daily - 7 x weekly - 1 sets - 10 reps - 5 sec  hold - Shoulder External Rotation and Scapular Retraction with Resistance  - 1 x daily - 7 x weekly - 2 sets - 10 reps - Gentle Levator Scapulae Stretch  - 1 x daily - 7 x weekly - 1 sets - 3 reps - 10-15 hold - Sternocleidomastoid Stretch  - 1 x daily - 7 x weekly - 1 sets - 3 reps - 10-15 sec  hold - Seated Shoulder Flexion Towel Slide at Table Top  - 1 x daily - 7 x weekly - 3 sets - 10 reps - Seated Shoulder External Rotation PROM on Table  - 1 x daily - 7 x weekly - 3 sets - 10 reps - Isometric Shoulder External Rotation  - 3 x daily - 7 x weekly - 1 sets - 5 reps - 5 sec  hold - Isometric Shoulder Flexion  - 3 x daily - 7 x weekly - 1 sets - 5 reps - 5 sec  hold - Isometric Shoulder Internal Rotation  - 3 x daily - 7 x weekly - 1 sets - 5 reps - 5 sec  hold - Sidelying Open Book  - 1 x daily - 7 x weekly - 1 sets - 10 reps - Supine Posterior Pelvic Tilt  - 1 x daily - 7 x weekly - 2 sets - 10 reps - Supine Hip Adduction Isometric with Ball  - 1 x daily - 7 x weekly - 1 sets - 10 reps - 10 sec hold - Supine March  - 1 x daily - 7 x weekly - 2 sets - 10 reps - Supine Bridge  - 1 x daily - 7 x weekly - 3 sets - 10 reps - Supine Active Straight Leg Raise  - 1 x daily - 7 x weekly - 3 sets - 10 reps - Prone Quadriceps Stretch with Strap  - 1 x daily - 7 x weekly - 3 sets - 10 reps - Beginner Prone Single Leg Raise  - 1 x daily - 7 x weekly - 3 sets - 10 reps - Single Leg Balance with Clock Reach  - 1 x daily - 7 x weekly - 3 sets - 10 reps - Squat  - 1 x daily - 7 x  weekly - 3 sets - 10 reps - Bird Dog  - 1 x daily - 7 x weekly - 3 sets - 10 reps - Cat Cow  - 1 x daily - 7 x weekly - 3 sets - 10 reps  ASSESSMENT:  CLINICAL IMPRESSION: CHERRYL BABIN reports no pain or soreness today.  Today focused on updated exercises focusing on whole body exercises  that could be performed at gym without weight equipment, but other equipment that should be available (like push-up bars).  She was challenged with single leg exercises like forward T's, better with clock exercise, and had difficulty with sequencing bird dogs.  Discussed modifications like towel roll under heels or palms of hands to help with discomfort during squats or quadruped exercises.  At this time, placing on 30 day hold.   If pain returns she will request new order and we will request new authorization from insurance.         OBJECTIVE IMPAIRMENTS: decreased activity tolerance, decreased ROM, hypomobility, increased fascial restrictions, impaired perceived functional ability, increased muscle spasms, impaired flexibility, impaired UE functional use, improper body mechanics, postural dysfunction, and pain.   ACTIVITY LIMITATIONS: carrying, lifting, bending, sitting, reach over head, locomotion level, and caring for others  PARTICIPATION LIMITATIONS: meal prep, cleaning, laundry, driving, shopping, community activity, occupation, and yard work  PERSONAL FACTORS: Past/current experiences, Time since onset of injury/illness/exacerbation, and 1-2 comorbidities: History of migraines, hypothyroidism, lyme disease, HTN.   are also affecting patient's functional outcome.   REHAB POTENTIAL: Good  CLINICAL DECISION MAKING: Evolving/moderate complexity  EVALUATION COMPLEXITY: Moderate   GOALS: Goals reviewed with patient? Yes  SHORT TERM GOALS: Target date: 02/08/2023   Patient will be independent with initial HEP.  Baseline: needs Goal status: MET 02/03/23 - met  2.  Patient will demonstrate negative supine to long sit test Baseline: positive on right Goal status: MET 01/27/23   LONG TERM GOALS: Target date: 03/22/2023   Patient will be independent with advanced/ongoing HEP to improve outcomes and carryover.  Baseline:  Goal status: MET  2.  Patient will report 75% improvement in  neck/upper back pain to improve QOL.  Baseline:  Goal status: MET - 03/02/23- no pain today  3.  Patient will report at least 7 points improvement on NDI to demonstrate improved functional ability.  Baseline: 14/50 Goal status: IN PROGRESS  4.  Patient will report 75% improvement in low back pain to improve QOL Baseline:  Goal status: MET 03/02/23- no pain today  5. Patient will demonstrate full lumbar AROM without pain to complete ADLs   Baseline: see objective Goal status: IN PROGRESS   6. Patient will demonstrate full R shoulder AROM without pain to complete ADLs.   Baseline: see objective Goal status: IN PROGRESS    PLAN:  PT FREQUENCY: 1-2x/week  PT DURATION: 8 weeks - modified due to insurance limitations, only 5 visits approved.   PLANNED INTERVENTIONS: Therapeutic exercises, Therapeutic activity, Neuromuscular re-education, Balance training, Gait training, Patient/Family education, Self Care, Joint mobilization, Joint manipulation, Dry Needling, Spinal manipulation, Spinal mobilization, Cryotherapy, Moist heat, Taping, Ultrasound, Manual therapy, and Re-evaluation  PLAN FOR NEXT SESSION: 30 day hold     Jena Gauss, PT, DPT  03/02/2023, 12:17 PM  PHYSICAL THERAPY DISCHARGE SUMMARY  Visits from Start of Care: 6  Current functional level related to goals / functional outcomes: Improved neck and upper back pain   Remaining deficits: See above   Education / Equipment: HEP  Plan: Patient agrees  to discharge.  Refer to above clinical impression and goal assessment for status as of last visit on 03/02/23. Patient was placed on hold for 30 days and has not needed to return to PT, therefore will proceed with discharge from PT for this episode.     Jena Gauss, PT, DPT 04/14/2023 9:42 AM

## 2023-03-04 ENCOUNTER — Telehealth: Payer: Self-pay | Admitting: Family Medicine

## 2023-03-04 ENCOUNTER — Encounter: Payer: Medicaid Other | Admitting: Physical Therapy

## 2023-03-04 NOTE — Telephone Encounter (Signed)
Office visit or would you like to adjust meds?

## 2023-03-04 NOTE — Telephone Encounter (Signed)
Pt called stating that she took her BP today and was a little concerned about it being higher than normal. BP readings were as follows:  Left Arm - 156/94 Right Arm - 145/93  Both readings were taken around 12:45p today (8.29.24). Pt stated she had no accompanying symptoms such as headache, dizziness, loss of balance, or chest palpitations. Please Advise.

## 2023-03-05 NOTE — Telephone Encounter (Signed)
Spoke with patient. I advised patient of Lowne wanting to increases medication. Pt states that concerns her because her blood pressure goes high and then becomes normal. Pt states checking blood pressure today and it was around 140/80. I advised patient to continue checking blood pressures over the weekend and to call us if she decides she wants the increased medication. Also, advised patient to check blood pressures after resting for 15 mins.

## 2023-03-23 ENCOUNTER — Ambulatory Visit: Payer: Medicaid Other | Admitting: Medical

## 2023-03-23 ENCOUNTER — Encounter: Payer: Self-pay | Admitting: Medical

## 2023-03-23 VITALS — BP 120/78 | HR 73 | Temp 98.1°F | Resp 18 | Ht 66.0 in | Wt 204.2 lb

## 2023-03-23 DIAGNOSIS — Z566 Other physical and mental strain related to work: Secondary | ICD-10-CM

## 2023-03-23 DIAGNOSIS — I10 Essential (primary) hypertension: Secondary | ICD-10-CM

## 2023-03-23 DIAGNOSIS — F32A Depression, unspecified: Secondary | ICD-10-CM | POA: Diagnosis not present

## 2023-03-23 MED ORDER — HYDROXYZINE HCL 10 MG PO TABS: ORAL_TABLET | ORAL | 0 refills | Status: DC

## 2023-03-23 NOTE — Patient Instructions (Signed)
Hypertension Well controlled on current regimen of Atenolol 25mg  twice daily and Irbesartan-HCTZ 150-12.5mg  twice daily. Noted occasional spikes in blood pressure associated with stressful events. -Continue current antihypertensive regimen. -don't recommend trying to add bp med to current bp regimen as spikes i think are anxiety related and could over treat bp inadvertantly since bp most of time tightly controlled.   Anxiety/Depression Chronic anxiety and depression managed with Xanax 0.5mg  three times daily and add on hydroxyzine 10 mg every 8 hours as needed. for anxiety the exceeds control of xanax. -Hydroxyzine to 10mg  for use during stressful events to avoid over-sedation compared to prior 25 mg use -Discuss potential increase in Cymbalta with psychiatrist to potentially improve concentration. -Check blood pressure consecutive readings every 15 minutes up to 3 readings during stressful events to monitor response after taking hydroxyzine.  Hoping that you stress will decrease and bp will eventually follow.   update me on med changes psychiatrist makes.   Follow up 1 month or sooner if needed.
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
## 2023-03-24 ENCOUNTER — Ambulatory Visit: Payer: Medicaid Other

## 2023-03-24 ENCOUNTER — Encounter: Payer: Self-pay | Admitting: Psychiatry

## 2023-03-24 ENCOUNTER — Ambulatory Visit (INDEPENDENT_AMBULATORY_CARE_PROVIDER_SITE_OTHER): Payer: Medicaid Other | Admitting: Psychiatry

## 2023-03-24 DIAGNOSIS — F900 Attention-deficit hyperactivity disorder, predominantly inattentive type: Secondary | ICD-10-CM | POA: Diagnosis not present

## 2023-03-24 DIAGNOSIS — F4001 Agoraphobia with panic disorder: Secondary | ICD-10-CM

## 2023-03-24 DIAGNOSIS — Z8659 Personal history of other mental and behavioral disorders: Secondary | ICD-10-CM | POA: Diagnosis not present

## 2023-03-24 DIAGNOSIS — Z638 Other specified problems related to primary support group: Secondary | ICD-10-CM

## 2023-03-24 DIAGNOSIS — F411 Generalized anxiety disorder: Secondary | ICD-10-CM | POA: Diagnosis not present

## 2023-03-24 MED ORDER — ALPRAZOLAM 0.5 MG PO TABS
ORAL_TABLET | ORAL | 3 refills | Status: DC
Start: 2023-03-24 — End: 2023-04-29

## 2023-03-24 MED ORDER — MODAFINIL 100 MG PO TABS
50.0000 mg | ORAL_TABLET | Freq: Every day | ORAL | 1 refills | Status: DC
Start: 2023-03-24 — End: 2023-10-19

## 2023-03-24 MED ORDER — DULOXETINE HCL 20 MG PO CPEP
40.0000 mg | ORAL_CAPSULE | Freq: Every day | ORAL | 3 refills | Status: DC
Start: 2023-03-24 — End: 2023-08-09

## 2023-03-24 NOTE — Progress Notes (Signed)
Jenna Arroyo [id number] Jul 05, 1966 57 y.o.    Subjective:   Patient ID:  Jenna Arroyo is a 57 y.o. (DOB April 27, 1966) female.  Chief Complaint:  Chief Complaint  Patient presents with   Follow-up   Anxiety   Stress    Anxiety Symptoms include nervous/anxious behavior. Patient reports no chest pain, confusion, decreased concentration, palpitations or suicidal ideas.     Jenna Arroyo presents to the office today for follow-up of anxiety and mood.  seen October 2020.  She had been having cognitive complaints as previously noted. Still recommend trial switch to Ativan 1 mg QID to see if cognition is better.  Her cognitive problems have frequently been in a contributor to her difficulty maintaining employment.  She is fearful about having Xanax withdrawal.  This was discussed in detail.  Also discussed in detail the potential sedative effects of benzodiazepines and especially in the first week of the transition from 1 of the medications to another.  It is likely that she will be more successful with the transition if she is a little more sleepy than normal the first week by using half of each medication then if we abruptly switch medications.  That has a much lower success rate in her case because of her worry about the transition. She agrees to a trial.  Will do 1/2 of 0.5 mg tablet tablet of Xanax and 1/2 of 1 mg tablet Ativan QID for 1 week. Then DC alprazolam and take Ativan 1 mg 3 times daily or 4 times daily to control anxiety.  Call back in 2 to 3 weeks to give a report of how well she is doing with the new medication. DC hydroxyzine because of its potential to cause cognitive problems. Continue duloxetine 60 mg daily.  10/18/19 appt with the following noted and no further med changes: She had difficulty implementing the transition and then complained of insomnia with lorazepam and was switched back to alprazolam after a week.  Also CO pain and nausea.   Overall doing OK except son and D  upset with her since December.  Disc this in detail.  Hasn't seen GD's since then.  Working at job she loves but very PT. Exercising and lost 18#  02/21/20 with the following noted: Hanging in there with stress.  Not allowed to see GD still.  Doesn't know why. No Covid.  OCC BP spikes. No major med changes. Satisfied with duloxetine and Xanax.  No full panic.   Sleep waxes and wanes but lately good.  Working same job as in April and likes it. Plan: no med changes  08/21/2020 appointment with the following noted: Stopped duloxetine for a week early Feb over BP concerns from it.  Convinced BP affected by 20 mg duloxetine which she's been taking for a few months.  Felt 30 mg was too high and then it started causing problems.  When off the duloxetine for a week then BP went down. Times when doesn't have it and the most noticeable thing is feeling tired and a little disconnected.  Longest off it was 2-4 weeks in last 6 mos. Has unresponsive PCP. Chronic tiredness.  Still dealing with chronic Lyme dz.  Plan: Stop duloxetine BC of BP concerns.  Monitor and record BP and share with PCP.  Stay off for a full month and if consistently more anxious then can add pure SSRI low dose Lexapro 5 mg for anxiety.  11/18/20 TC Pt had asked to increase Lexapro to 10  mg while mo in the hospital.  11/25/20 TC:  Pt contacted doctor on call this weekend concerning her issues with Lexapro 10 mg. Since the increase to 10 mg she has been unable to sleep, reports black circles under her eyes, gets to sleep about 1-2 am. She also reports on Saturday she had a anxiety attack, she felt it was medication related not just her anxiety. Jenna Rack, NP advised her to stop Lexapro and nurse would contact her Monday.  She asks about trying just the Lexapro 5 mg, she reports it helped and it didn't effect her sleep like 10 mg.  Pt stated the lexapro made her have increased anxiety and she thinks its best if she goes back to a very  low dose of Cymbalta.She stopped Cymbalta due to it spiking her blood pressure.She says her bp was fine at the 20 mg dose. MD: ok to stop Lexapro and return to duloxetine 20  12/11/20 appt noted: Under more stress and more anxious.  Stress about 3 mos.  Dealing with condo where she lives.  Not making enough money to get basic things she needs and asking for money from parents.  Wants father to fix things in the condo but he doesn't do anything.   Place had a leak in the past.    Taking duloxetine 20 and off Lexapro and on alprazolam 0.5 mg QID.   Worries over the condition in the world and thinks the world could end and she might not be able to get the Xanax and fears withdrawal.    She feels bible is predicting end of the world soon.  But no thoughts of death were noted Plan: no med changes per her request.  02/11/2021 appointment with the following noted: Family sold condo she was in and so she moved back in with parents about 3 weeks ago.  Stressed.  Has been seeking disability for a couple of years.  Doesn't think she can work FT.  Other relationships are stressed.   Chronic difficulty with organization. Walking and losing weight but worries about BP so doesn't want to increase the duloxetine. Patient reports stable mood and denies depressed or irritable moods.  Patient has chronic difficulty with anxiety and stress. Denies appetite disturbance.  Patient reports that energy and motivation have been good.  Patient denies any difficulty with concentration.  Patient denies any suicidal ideation. No panic. Sleep is OK. Using Xanax mostly TID.  On duloxetine 20 mg daily and doesn't want to change it.. Sleep better in the day.    No questions concerns re: meds. Plan no med changes per her request  06/19/2021 appointment with the following noted: Working Therapist, nutritional retirement.  Job offers [employer] FT Conflict with sister over dependence on her parents.  Feels her sister is unreasonable.   Can't handle being under that control.  Was doing good until she started in.  Blocked her contact.  Sister blames her for parents not having enough money. Siblings forcing her to get a job. Satisfied with the meds. Upset at having to pay all her bills on her own now. Recognizes chronic difficulties managing herself well.  12/29/21 appt noted: Doing OK.  Family turned son against her.  It's tough.  Managing anxiety on current meds.  Don't want tto change the meds. Still grieving mother and dealing with family drama. On duloxetine 20 mg daily and Xanax 0.5 mg TID. Doesn't want to increase duloxetine 20 mg daily bc it increases BP.  04/24/2022 phone call complaining of depression and anxiety with low motivation and difficulty functioning at work.  Wants to increase duloxetine. MD response to increase duloxetine to 40 mg daily 04/27/2022 phone call patient indicated she wanted to go up in the dose slowly and wanted a prescription for duloxetine 30 mg daily.  05/27/2022 appointment noted: Current psych relevant meds include vitamin D 50,000 units weekly, hydroxyzine 25 mg every 8 hours as needed anxiety,  duloxetine 30 mg capsules 1 daily ( but hasn't started yet.  Plans to tomoroow) , Xanax 0.5 mg tablets 1 5 times a day as needed anxiety.  Patient states she is taking about 3 daily. Pretty rough still trying to find another job. M died Sep 11, 2021 and she was in such a fog. Traumatized  by mother's death.  Shouldn't have happened. She believes F was narcissist.  He's tuned my son against me.  Conflict with all of the family.  Not ready to talk to them about it.  Estranged from family in part over eviction from family's property.   Living with mother's good friend. Plans to increase duloxetine 20 to 30 mg daily. Some arthritis issues.  08/06/22 TC about particular technique for tapering Xanax.  She was advised this is complicated and should be discussed at an appointment.  10/01/22 appt  noted: Current Alprazolam 0.5 mg TID, duloxetine 30 mg daily.   No SE. Thinks duloxetine 15 would be the perfect dose.  60 is too much.  Feels better with 30 mg daily. Back to walking and BP has gone down. Biggest stress job interviews and trying to find decent paying job.   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 Last employed before [employer], lasted 3 mos.  Didn't feel adequate training.   Disc her phone call about tapering Xanax.  Fears shortages of the med in the future giving what's going on in world.  Plan: No med changes today.  03/24/23 appt noted:  Taking duloxetine 30 mg daily, alprazolam 0.5 mg TID, hydroxyzine prn but makes her drowsy. Thinks she needs to increase duloxetine. For depression.  At times feels disassociating from reality without psychosis.  Uses hydroxyzine when feels pressured by others like "assertive" roommate.  Roommate can trigger disassociation.May need low rent housing.  Can't live off $15K / year.   Lost last job and needing to start new job with pressure to provide for herself.  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 Visiting church [place] and looking for a place to stay.   Real financial stress.   Still trouble learning and retaining information.    Past Psychiatric Medication Trials: Duloxetine 60, imipramine, desipramine, sertraline hyper, citalopram side effects, paroxetine chest pain,  Lexapro ? Less effective for anxiety Nuvigil edgy, provigil   buspirone dizzy,  Xanax, clonazepam Ambien anxiety, trazodone side effects,  atenolol  Review of Systems:  Review of Systems  Constitutional:  Positive for fatigue.       Low exercise tolerance  Cardiovascular:  Negative for chest pain and palpitations.  Gastrointestinal:  Negative for vomiting.  Musculoskeletal:  Positive for arthralgias and neck pain.  Neurological:  Positive for headaches. Negative for tremors and weakness.       Foggy headed.   Psychiatric/Behavioral:  Positive for sleep disturbance. Negative for agitation, behavioral problems, confusion, decreased concentration, dysphoric mood, hallucinations, self-injury and suicidal ideas. The patient is nervous/anxious. The patient is not hyperactive.     Medications: I have reviewed the patient's  current medications.  Current Outpatient Medications  Medication Sig Dispense Refill   albuterol (VENTOLIN HFA) 108 (90 Base) MCG/ACT inhaler Inhale 1-2 puffs into the lungs every 6 (six) hours as needed. 1 each 0   atenolol (TENORMIN) 25 MG tablet Take 1 tablet by mouth twice daily 180 tablet 0   Cholecalciferol (VITAMIN D) 10 MCG/ML LIQD Take by mouth.     ciclopirox (PENLAC) 8 % solution Apply topically at bedtime. Apply over nail and surrounding skin. Apply daily over previous coat. After seven (7) days, may remove with alcohol and continue cycle. 6.6 mL 0   ELDERBERRY PO Take by mouth.     EPINEPHrine (EPIPEN 2-PAK) 0.3 mg/0.3 mL IJ SOAJ injection Inject 0.3 mg into the muscle as needed for anaphylaxis. 1 each 0   fluconazole (DIFLUCAN) 150 MG tablet 1 po weekly x 4 4 tablet 0   hydrOXYzine (ATARAX) 10 MG tablet 1-2 tab po every 8 hours prn anxiety 30 tablet 0   irbesartan-hydrochlorothiazide (AVALIDE) 150-12.5 MG tablet Take 2 tablets by mouth daily. 180 tablet 1   levocetirizine (XYZAL) 5 MG tablet Take 1 tablet (5 mg total) by mouth every evening. 30 tablet 0   meloxicam (MOBIC) 7.5 MG tablet Take 1 tablet (7.5 mg total) by mouth daily. 30 tablet 0   modafinil (PROVIGIL) 100 MG tablet Take 0.5 tablets (50 mg total) by mouth daily. 15 tablet 1   nystatin cream (MYCOSTATIN) Apply 1 Application topically 2 (two) times daily. 30 g 1   ofloxacin (OCUFLOX) 0.3 % ophthalmic solution Place 1 drop into the right eye every hour while awake.     PREBIOTIC PRODUCT PO Take by mouth. Mix in shake and drink once a day.     promethazine (PHENERGAN) 25 MG tablet Take 1 tablet (25 mg total) by  mouth every 8 (eight) hours as needed for nausea or vomiting. 20 tablet 0   silver sulfADIAZINE (SILVADENE) 1 % cream Apply 1 application topically daily. 50 g 0   thyroid (NP THYROID) 15 MG tablet Take 3 tablets (45 mg total) by mouth daily. 270 tablet 0   tizanidine (ZANAFLEX) 2 MG capsule Take 1 capsule (2 mg total) by mouth 3 (three) times daily. 30 capsule 1   Vitamin D, Ergocalciferol, (DRISDOL) 1.25 MG (50000 UNIT) CAPS capsule Take 1 capsule (50,000 Units total) by mouth once a week. 12 capsule 1   ALPRAZolam (XANAX) 0.5 MG tablet TAKE ONE TABLET EVERY MORNING, TAKE ONE TABLET DAILY AT NOON, TAKE ONE TABLET EVERY EVENING AND TAKE ONE TABLET DAILY AT BEDTIME 120 tablet 3   DULoxetine (CYMBALTA) 20 MG capsule Take 2 capsules (40 mg total) by mouth daily. 60 capsule 3   No current facility-administered medications for this visit.    Medication Side Effects: None  Allergies:  Allergies  Allergen Reactions   Ceftriaxone Sodium In Dextrose Other (See Comments)    "heart races and cannot breathe"   Guaifenesin & Derivatives Other (See Comments)    "heart races, dizziness, light-headedness"   Meperidine Other (See Comments)    "heart races"   Other Rash   Penicillins Hives   Pseudoephedrine Other (See Comments)    "heart races and high blood pressure"   Rosuvastatin Calcium Nausea And Vomiting    Muscle aches    Simvastatin Other (See Comments)    muscle aching    Ciprofloxacin    Rocephin [Ceftriaxone] Itching    Past Medical History:  Diagnosis Date   Anxiety  High cholesterol    Hypertension    Lyme disease    Thyroid disease     Family History  Problem Relation Age of Onset   Hypertension Mother    High Cholesterol Mother    Heart disease Father    High Cholesterol Father    Hypertension Sister    High Cholesterol Sister    Hypertension Brother    High Cholesterol Brother    Colon cancer Neg Hx     Social History   Socioeconomic History   Marital  status: Divorced    Spouse name: Not on file   Number of children: Not on file   Years of education: Not on file   Highest education level: Not on file  Occupational History   Not on file  Tobacco Use   Smoking status: Former    Current packs/day: 0.00    Types: Cigarettes    Quit date: 2006    Years since quitting: 18.7   Smokeless tobacco: Never   Tobacco comments:    socially   Advertising account planner   Vaping status: Never Used  Substance and Sexual Activity   Alcohol use: No   Drug use: No   Sexual activity: Not Currently    Birth control/protection: Post-menopausal  Other Topics Concern   Not on file  Social History Narrative   Not on file   Social Determinants of Health   Financial Resource Strain: Not on file  Food Insecurity: No Food Insecurity (11/25/2020)   Received from [facility], [facility]   Hunger Vital Sign    Worried About Running Out of Food in the Last Year: Never true    Ran Out of Food in the Last Year: Never true  Transportation Needs: No Transportation Needs (06/18/2020)   PRAPARE - Administrator, Civil Service (Medical): No    Lack of Transportation (Non-Medical): No  Physical Activity: Not on file  Stress: Not on file  Social Connections: Unknown (11/15/2021)   Received from [facility], [facility]   Social Network    Social Network: Not on file  Intimate Partner Violence: Unknown (10/07/2021)   Received from [facility]   HITS    Physically Hurt: Not on file    Insult or Talk Down To: Not on file    Threaten Physical Harm: Not on file    Scream or Curse: Not on file    Past Medical History, Surgical history, Social history, and Family history were reviewed and updated as appropriate.   Please see review of systems for further details on the patient's review from today.   Objective:   Physical Exam:  There were no vitals taken for this visit.  Physical Exam Constitutional:      General: She is not in  acute distress. Musculoskeletal:        General: No deformity.  Neurological:     Mental Status: She is alert and oriented to person, place, and time.     Cranial Nerves: No dysarthria.     Coordination: Coordination normal.  Psychiatric:        Attention and Perception: Perception normal. She is inattentive. She does not perceive auditory or visual hallucinations.        Mood and Affect: Mood is anxious and depressed. Affect is not labile, angry, tearful or inappropriate.        Speech: Speech normal. Speech is not slurred.        Behavior: Behavior normal. Behavior  is cooperative.        Thought Content: Thought content normal. Thought content is not paranoid or delusional. Thought content does not include homicidal or suicidal ideation. Thought content does not include suicidal plan.        Cognition and Memory: Cognition and memory normal.        Judgment: Judgment normal.     Comments: Insight fair.   chronically low stress tolerance.  Still some dep. Complain of chronic forgetfulness Talkative and need      Lab Review:     Component Value Date/Time   NA 137 01/06/2023 1315   K 4.2 01/06/2023 1315   CL 98 01/06/2023 1315   CO2 31 01/06/2023 1315   GLUCOSE 87 01/06/2023 1315   BUN 15 01/06/2023 1315   CREATININE 0.86 01/06/2023 1315   CREATININE 0.85 04/05/2020 1052   CALCIUM 9.6 01/06/2023 1315   PROT 6.6 01/06/2023 1315   ALBUMIN 4.0 01/06/2023 1315   AST 15 01/06/2023 1315   ALT 20 01/06/2023 1315   ALKPHOS 88 01/06/2023 1315   BILITOT 0.7 01/06/2023 1315   GFRNONAA >60 02/01/2020 1513   GFRAA >60 02/01/2020 1513       Component Value Date/Time   WBC 7.2 01/06/2023 1315   RBC 4.38 01/06/2023 1315   HGB 13.5 01/06/2023 1315   HCT 39.5 01/06/2023 1315   PLT 377.0 01/06/2023 1315   MCV 90.2 01/06/2023 1315   MCH 30.3 04/05/2020 1052   MCHC 34.3 01/06/2023 1315   RDW 13.5 01/06/2023 1315   LYMPHSABS 2.0 01/06/2023 1315   MONOABS 0.4 01/06/2023 1315    EOSABS 0.2 01/06/2023 1315   BASOSABS 0.0 01/06/2023 1315    No results found for: "POCLITH", "LITHIUM"   No results found for: "PHENYTOIN", "PHENOBARB", "VALPROATE", "CBMZ"   .res Assessment: Plan:   Generalized anxiety disorder, manageable  Attention deficit hyperactivity disorder improved with duloxetine  History of panic attacks in remission with current meds  Chronic family conflict ongoing.   Patient has been under our care for many years.  She has some chronic instability and anxiety and function but does generally get benefit from the medication.  She has not been able to reduce the dosage of alprazolam which she has taken for many years due to excessive anxiety.  Chronic problems and conflicts with parents and sister and brother and other people  Increase for dep to Cymbalta 40 mg daily of duloxetine for chronic anxiety.    She failed attempted transition from alprazolam to lorazepam in October 2020.  It appears she had some withdrawal symptoms and she is gone back to alprazolam.  It was discussed with her that we could do the transition more gradually if she wanted to pursue that to see if she can get cognitive benefit with the switch.  She does not want to do that at this time.  Anxiety interferes with cognition too.  Option switch to lorazepam.  Continue Xanax 0.5 mg QID, she's been able to limit to 3 daily.  She can continue to try hydroxyzine 10-20  mg every 8 hours as needed anxiety.  However if it does not help then do not take it.  Discussed side effect risks.. she doesn't want more meds.  Again extensively We discussed the short-term risks associated with benzodiazepines including sedation and increased fall risk among others.  Discussed long-term side effect risk including dependence, potential withdrawal symptoms, and the potential eventual dose-related risk of dementia.  But recent studies from 2020  dispute this association between benzodiazepines and dementia risk.  Newer studies in 2020 do not support an association with dementia.   Has started counseling at [facility] Supportive and problem solving techniques with family conflict with sister and son and grief with mother's death still a problem.  Mother protected her with other family members.   Educated about her father's recent dx of dementia and what it means to have the dx. Supportive therapy dealing with family conflict.  "Broken heart with my son. "   Has apologized but won't be forgiven.  He has cut ties with her.  (NAC) N-Acetylcysteine 2 of the  600 mg capsules daily to help with mild cognitive problems.  It can be combined with a B-complex vitamin as the B-12 and folate which can sometimes enhance the effect.  Disc ADD tx.  Discussed potential benefits, risks, and side effects of stimulants with patient to include increased heart rate, palpitations, insomnia, increased anxiety, increased irritability, or decreased appetite.  Instructed patient to contact office if experiencing any significant tolerability issues. Retry modafinil bc took before at 50 mg AM Less likely to cause anxiety than regular stimulants.    FU 3-4 mos  Meredith Staggers, MD, DFAPA   Please see After Visit Summary for patient specific instructions.  No future appointments.     No orders of the defined types were placed in this encounter.     -------------------------------

## 2023-03-24 NOTE — Patient Instructions (Addendum)
(  NAC) N-Acetylcysteine 2 of the  600 mg capsules daily to help with mild cognitive problems.  It can be combined with a B-complex vitamin as the B-12 and folate which can sometimes enhance the effect.  Increase duloxetine to 40 mg daily for anxiety and depression

## 2023-03-25 ENCOUNTER — Telehealth: Payer: Self-pay | Admitting: Family Medicine

## 2023-03-25 DIAGNOSIS — E559 Vitamin D deficiency, unspecified: Secondary | ICD-10-CM

## 2023-03-25 MED ORDER — VITAMIN D (ERGOCALCIFEROL) 1.25 MG (50000 UNIT) PO CAPS
50000.0000 [IU] | ORAL_CAPSULE | ORAL | 1 refills | Status: DC
Start: 2023-03-25 — End: 2024-05-02

## 2023-03-25 NOTE — Telephone Encounter (Signed)
Patient called and would like a med refill Vitamin D, Ergocalciferol, (DRISDOL) 1.25 MG (50000 UNIT) CAPS capsule

## 2023-03-25 NOTE — Telephone Encounter (Signed)
Refill sent.

## 2023-03-31 ENCOUNTER — Telehealth: Payer: Self-pay | Admitting: Family Medicine

## 2023-03-31 NOTE — Telephone Encounter (Signed)
Pt said she started taking a supplement called NAC. She said she is taking for multiple reasons but she is taking for mental clarity and inflammation to name a couple. Pt said that the pharmacist told her there shouldn't be any drug interactions but patient said her bp does drop some so she wanted to know if she should take it a different time of day or so many hours after a dose of her irbesartan. Please call to advise.

## 2023-04-05 ENCOUNTER — Telehealth: Payer: Self-pay | Admitting: Family Medicine

## 2023-04-05 MED ORDER — HYDROXYZINE HCL 10 MG PO TABS: ORAL_TABLET | ORAL | 0 refills | Status: DC

## 2023-04-05 NOTE — Addendum Note (Signed)
Addended by: Maximino Sarin on: 04/05/2023 11:15 AM   Modules accepted: Orders

## 2023-04-05 NOTE — Telephone Encounter (Signed)
Rx sent 

## 2023-04-05 NOTE — Telephone Encounter (Signed)
Prescription Request  04/05/2023  Is this a "Controlled Substance" medicine? Yes  LOV: 12/08/2022  What is the name of the medication or equipment?  hydrOXYzine (ATARAX) 10 MG tablet  Have you contacted your pharmacy to request a refill? No   Which pharmacy would you like this sent to?  [facility] [address] [place], [place] - [address] [place] [address] Phone: [phone] Fax: [phone]      Patient notified that their request is being sent to the clinical staff for review and that they should receive a response within 2 business days.   Please advise at Mobile [phone] (mobile)

## 2023-04-07 NOTE — Telephone Encounter (Signed)
Pt called. LDVM

## 2023-04-19 ENCOUNTER — Other Ambulatory Visit: Payer: Self-pay | Admitting: Family Medicine

## 2023-04-29 ENCOUNTER — Other Ambulatory Visit: Payer: Self-pay

## 2023-04-29 ENCOUNTER — Telehealth: Payer: Self-pay | Admitting: Psychiatry

## 2023-04-29 DIAGNOSIS — F411 Generalized anxiety disorder: Secondary | ICD-10-CM

## 2023-04-29 DIAGNOSIS — Z8659 Personal history of other mental and behavioral disorders: Secondary | ICD-10-CM

## 2023-04-29 MED ORDER — ALPRAZOLAM 0.5 MG PO TABS
ORAL_TABLET | ORAL | 1 refills | Status: DC
Start: 2023-04-29 — End: 2023-08-10

## 2023-04-29 NOTE — Telephone Encounter (Signed)
pended

## 2023-04-29 NOTE — Telephone Encounter (Signed)
Patient called in for refill on Alprazolam 0.5mg . Ph: [phone] appt 12/5 Pharmacy [facility] [address] [place], [place]

## 2023-05-03 ENCOUNTER — Telehealth: Payer: Self-pay | Admitting: Family Medicine

## 2023-05-03 ENCOUNTER — Other Ambulatory Visit: Payer: Self-pay | Admitting: Family Medicine

## 2023-05-03 NOTE — Telephone Encounter (Signed)
Prescription Request  05/03/2023  Is this a "Controlled Substance" medicine? No  LOV: 12/08/2022  What is the name of the medication or equipment?   thyroid (NP THYROID) 15 MG tablet   Have you contacted your pharmacy to request a refill? Yes   Which pharmacy would you like this sent to?  [facility] [address] [place], [place] - [address] [place] [address] Phone: [phone] Fax: [phone]    Patient notified that their request is being sent to the clinical staff for review and that they should receive a response within 2 business days.   Please advise at Mobile [phone] (mobile)

## 2023-05-03 NOTE — Telephone Encounter (Signed)
Refill sent.

## 2023-05-19 ENCOUNTER — Encounter: Payer: Self-pay | Admitting: Psychiatry

## 2023-05-31 ENCOUNTER — Telehealth: Payer: Self-pay | Admitting: Family Medicine

## 2023-05-31 ENCOUNTER — Ambulatory Visit: Payer: Medicaid Other | Admitting: General Practice

## 2023-05-31 ENCOUNTER — Ambulatory Visit: Payer: Medicaid Other | Admitting: Family Medicine

## 2023-05-31 NOTE — Telephone Encounter (Signed)
Initial Comment Caller states their BP is 156/96. They have a headache. Translation No Nurse Assessment Nurse: Elesa Hacker, RN, Nash Dimmer Date/Time [name]: 05/31/2023 9:07:15 AM Confirm and document reason for call. If symptomatic, describe symptoms. ---caller states that she has b/p of 156/96 with headache. Caller states that she is having sinus issues as well. Caller rechecked/p 131/91. Has taken B/P pill. Caller has a headache in the sinus area. Does the patient have any new or worsening symptoms? ---Yes Will a triage be completed? ---Yes Related visit to physician within the last 2 weeks? ---No Does the PT have any chronic conditions? (i.e. diabetes, asthma, this includes High risk factors for pregnancy, etc.) ---Yes List chronic conditions. ---HTN lyme disease , chronic , 15 years hasmotois Is this a behavioral health or substance abuse call? ---No Guidelines Guideline Title Affirmed Question Affirmed Notes Nurse Date/Time (Eastern Time) Blood Pressure - High [1] Systolic BP >= 130 OR Diastolic >= 80 AND [2] taking BP medications Deaton, RN, Nash Dimmer 05/31/2023 9:10:55 AM Disp. Time [name] Disposition Final User 05/31/2023 9:12:31 AM See PCP within 24 Hours Yes Deaton, RN, Nash Dimmer PLEASE NOTE: All timestamps contained within this report are represented as [place] Standard Time. CONFIDENTIALTY NOTICE: This fax transmission is intended only for the addressee. It contains information that is legally privileged, confidential or otherwise protected from use or disclosure. If you are not the intended recipient, you are strictly prohibited from reviewing, disclosing, copying using or disseminating any of this information or taking any action in reliance on or regarding this information. If you have received this fax in error, please notify us immediately by telephone so that we can arrange for its return to [place]. Phone: [phone], Toll-Free: [phone], Fax:  [phone] Page: 2 of 2 Call Id: [id number] Final Disposition 05/31/2023 9:12:31 AM See PCP within 24 Hours Yes Deaton, RN, Nash Dimmer Disposition Overriden: See PCP within 2 Weeks Override Reason: Patient's symptoms need a higher level of care Caller Disagree/Comply Comply Caller Understands Yes PreDisposition Did not know what to do Care Advice Given Per Guideline SEE PCP WITHIN 24 HOURS: * IF OFFICE WILL BE OPEN: You need to be examined within the next 24 hours. Call your doctor (or NP/PA) when the office opens and make an appointment. CALL BACK IF: * Weakness or numbness of the face, arm or leg on one side of the body occurs * Difficulty walking, difficulty talking, or severe headache occurs * Chest pain or difficulty breathing occurs * You become worse CARE ADVICE given per Blood Pressure - High (Adult) guideline. Comments User: Wandra Scot, RN Date/Time [name]: 05/31/2023 9:09:55 AM Has not taken any sinus OTC meds, r/t B/P. User: Wandra Scot, RN Date/Time [name]: 05/31/2023 9:11:14 AM Caller has appt at 10 am. User: Wandra Scot, RN Date/Time [name]: 05/31/2023 9:13:56 AM Caller upgraded to be seen within 24 hours. Caller has appt at 10 am already scheduled. Caller upgraded related to B/P , headache and sinus sx. Referrals REFERRED TO PCP OFFICE

## 2023-05-31 NOTE — Telephone Encounter (Signed)
Pt called and stated that her bp was at 156/94. She wanted to be added on our schedule so I scheduled her an OV at 10 am and routed the call to a triage nurse.

## 2023-05-31 NOTE — Telephone Encounter (Signed)
Appt rescheduled for tomorrow.

## 2023-06-01 ENCOUNTER — Encounter: Payer: Self-pay | Admitting: Family Medicine

## 2023-06-01 ENCOUNTER — Ambulatory Visit: Payer: Medicaid Other | Admitting: Family Medicine

## 2023-06-01 VITALS — BP 112/70 | HR 73 | Temp 98.1°F | Resp 18 | Ht 66.0 in | Wt 202.2 lb

## 2023-06-01 DIAGNOSIS — K4090 Unilateral inguinal hernia, without obstruction or gangrene, not specified as recurrent: Secondary | ICD-10-CM

## 2023-06-01 DIAGNOSIS — J209 Acute bronchitis, unspecified: Secondary | ICD-10-CM | POA: Diagnosis not present

## 2023-06-01 DIAGNOSIS — E559 Vitamin D deficiency, unspecified: Secondary | ICD-10-CM

## 2023-06-01 DIAGNOSIS — R0982 Postnasal drip: Secondary | ICD-10-CM

## 2023-06-01 DIAGNOSIS — E538 Deficiency of other specified B group vitamins: Secondary | ICD-10-CM

## 2023-06-01 DIAGNOSIS — E039 Hypothyroidism, unspecified: Secondary | ICD-10-CM | POA: Diagnosis not present

## 2023-06-01 DIAGNOSIS — T7840XA Allergy, unspecified, initial encounter: Secondary | ICD-10-CM | POA: Diagnosis not present

## 2023-06-01 MED ORDER — VENTOLIN HFA 108 (90 BASE) MCG/ACT IN AERS: 2.0000 | INHALATION_SPRAY | Freq: Four times a day (QID) | RESPIRATORY_TRACT | 5 refills | Status: AC | PRN

## 2023-06-01 MED ORDER — FLUTICASONE PROPIONATE 50 MCG/ACT NA SUSP: 2.0000 | Freq: Every day | NASAL | 6 refills | Status: DC

## 2023-06-01 NOTE — Progress Notes (Signed)
Established Patient Office Visit  Subjective   Patient ID: Jenna Arroyo, female    DOB: 1965/08/08  Age: 57 y.o. MRN: [id number]  Chief Complaint  Patient presents with   Follow-up    Concerns/ questions: 1. Pt says her BP has gone up. 2. Memory has become an issue, but she has been under a lot of stress.     HPI Discussed the use of AI scribe software for clinical note transcription with the patient, who gave verbal consent to proceed.  History of Present Illness   The patient, with a history of hypertension and memory concerns, presents with fluctuating blood pressure and ongoing cognitive issues. She reports that her blood pressure has been varying significantly, with a recent reading of 158/96 at home. She expresses concern about the accuracy of her home blood pressure cuff and its potential impact on these readings.  In addition to hypertension, the patient is also worried about her memory. She describes a lifelong struggle with concentration and focus, which she believes has worsened under recent stress. She reports a tendency to 'shut down' when overwhelmed. She has been prescribed Provigil for focus and is considering starting it, but expresses concern about potential effects on her blood pressure.  The patient also reports feeling consistently tired, despite having normal thyroid levels as of her last check in July. She mentions waking up with a heart rate of 103 and is unsure if this is related to her fatigue. She has been managing sinus pressure with Benadryl at night and is considering restarting Flonase.  The patient's living situation is also a source of stress, as she is currently living with a roommate who has a 'temper.' She expresses concern about the impact of this stress on her health and job. She is also dealing with the recent loss of her mother, which has added to her emotional burden.       Patient Active Problem List   Diagnosis Date Noted   Cervical spine pain  12/08/2022   Chronic midline thoracic back pain 12/08/2022   Onychomycosis of great toe 12/08/2022   B12 deficiency 12/08/2022   Other fatigue 12/08/2022   Intertrigo 12/08/2022   Cervical strain 02/18/2021   Dyspepsia 02/18/2021   Urinary frequency 12/06/2020   Hyponatremia 09/23/2020   Anemia 06/14/2020   Nausea 04/05/2020   History of panic attacks 01/23/2019   GAD (generalized anxiety disorder) 04/22/2018   ADD (attention deficit disorder) 04/22/2018   Pharyngitis 12/11/2016   Axillary lymphadenopathy 12/11/2016   Lyme disease 12/11/2016   Dyslipidemia 06/18/2016   Essential hypertension 06/18/2016   Hypothyroidism 06/18/2016   Past Medical History:  Diagnosis Date   Anxiety    High cholesterol    Hypertension    Lyme disease    Thyroid disease    Past Surgical History:  Procedure Laterality Date   ABDOMINAL EXPLORATION SURGERY     APPENDECTOMY     MASTOIDECTOMY     TONSILECTOMY/ADENOIDECTOMY WITH MYRINGOTOMY     Social History   Tobacco Use   Smoking status: Former    Current packs/day: 0.00    Types: Cigarettes    Quit date: 2006    Years since quitting: 18.9   Smokeless tobacco: Never   Tobacco comments:    socially   Vaping Use   Vaping status: Never Used  Substance Use Topics   Alcohol use: No   Drug use: No   Social History   Socioeconomic History   Marital status: Divorced  Spouse name: Not on file   Number of children: Not on file   Years of education: Not on file   Highest education level: Not on file  Occupational History   Not on file  Tobacco Use   Smoking status: Former    Current packs/day: 0.00    Types: Cigarettes    Quit date: 2006    Years since quitting: 18.9   Smokeless tobacco: Never   Tobacco comments:    socially   Vaping Use   Vaping status: Never Used  Substance and Sexual Activity   Alcohol use: No   Drug use: No   Sexual activity: Not Currently    Birth control/protection: Post-menopausal  Other Topics  Concern   Not on file  Social History Narrative   Not on file   Social Determinants of Health   Financial Resource Strain: Not on file  Food Insecurity: No Food Insecurity (11/25/2020)   Received from [facility], [facility]   Hunger Vital Sign    Worried About Running Out of Food in the Last Year: Never true    Ran Out of Food in the Last Year: Never true  Transportation Needs: No Transportation Needs (06/18/2020)   PRAPARE - Administrator, Civil Service (Medical): No    Lack of Transportation (Non-Medical): No  Physical Activity: Not on file  Stress: Not on file  Social Connections: Unknown (11/15/2021)   Received from [facility], [facility]   Social Network    Social Network: Not on file  Intimate Partner Violence: Unknown (10/07/2021)   Received from [facility], [facility]   HITS    Physically Hurt: Not on file    Insult or Talk Down To: Not on file    Threaten Physical Harm: Not on file    Scream or Curse: Not on file   Family Status  Relation Name Status   Mother Asencion Islam Deceased at age 21   Father Mr. Threasa Beards   Sister [address] Alive   Brother Billy,49 Alive   Son Jordan,29 Alive   Neg Hx  (Not Specified)  No partnership data on file   Family History  Problem Relation Age of Onset   Heart failure Mother    Hypertension Mother    High Cholesterol Mother    Heart disease Father    High Cholesterol Father    Dementia Father    Hypertension Sister    High Cholesterol Sister    Hypertension Brother    High Cholesterol Brother    Colon cancer Neg Hx    Allergies  Allergen Reactions   Ceftriaxone Sodium In Dextrose Other (See Comments)    "heart races and cannot breathe"   Guaifenesin & Derivatives Other (See Comments)    "heart races, dizziness, light-headedness"   Meperidine Other (See Comments)    "heart races"   Other Rash   Penicillins Hives   Pseudoephedrine Other (See Comments)    "heart races and high blood  pressure"   Rosuvastatin Calcium Nausea And Vomiting    Muscle aches    Simvastatin Other (See Comments)    muscle aching    Ciprofloxacin    Rocephin [Ceftriaxone] Itching      Review of Systems  Constitutional:  Negative for chills, fever and malaise/fatigue.  HENT:  Negative for congestion and hearing loss.   Eyes:  Negative for blurred vision and discharge.  Respiratory:  Negative for cough, sputum production and shortness of breath.   Cardiovascular:  Negative for chest pain, palpitations and leg swelling.  Gastrointestinal:  Negative for abdominal pain, blood in stool, constipation, diarrhea, heartburn, nausea and vomiting.  Genitourinary:  Negative for dysuria, frequency, hematuria and urgency.  Musculoskeletal:  Negative for back pain, falls and myalgias.  Skin:  Negative for rash.  Neurological:  Negative for dizziness, sensory change, loss of consciousness, weakness and headaches.  Endo/Heme/Allergies:  Negative for environmental allergies. Does not bruise/bleed easily.  Psychiatric/Behavioral:  Negative for depression and suicidal ideas. The patient is not nervous/anxious and does not have insomnia.       Objective:     BP 112/70 (BP Location: Left Arm, Patient Position: Sitting, Cuff Size: Large)   Pulse 73   Temp 98.1 F (36.7 C) (Temporal)   Resp 18   Ht 5\' 6"  (1.676 m)   Wt 202 lb 3.2 oz (91.7 kg)   SpO2 97%   BMI 32.64 kg/m  BP Readings from Last 3 Encounters:  06/01/23 112/70  03/23/23 120/78  01/28/23 120/68   Wt Readings from Last 3 Encounters:  06/01/23 202 lb 3.2 oz (91.7 kg)  03/23/23 204 lb 3.2 oz (92.6 kg)  01/28/23 205 lb 12.8 oz (93.4 kg)   SpO2 Readings from Last 3 Encounters:  06/01/23 97%  03/23/23 98%  01/28/23 99%      Physical Exam Vitals and nursing note reviewed.  Constitutional:      General: She is not in acute distress.    Appearance: Normal appearance. She is well-developed.  HENT:     Head: Normocephalic and  atraumatic.     Right Ear: Tympanic membrane, ear canal and external ear normal. There is no impacted cerumen.     Left Ear: Tympanic membrane, ear canal and external ear normal. There is no impacted cerumen.     Nose: Nose normal.     Mouth/Throat:     Mouth: Mucous membranes are moist.     Pharynx: Oropharynx is clear. No oropharyngeal exudate or posterior oropharyngeal erythema.  Eyes:     General: No scleral icterus.       Right eye: No discharge.        Left eye: No discharge.     Conjunctiva/sclera: Conjunctivae normal.     Pupils: Pupils are equal, round, and reactive to light.  Neck:     Thyroid: No thyromegaly or thyroid tenderness.     Vascular: No JVD.  Cardiovascular:     Rate and Rhythm: Normal rate and regular rhythm.     Heart sounds: Normal heart sounds. No murmur heard. Pulmonary:     Effort: Pulmonary effort is normal. No respiratory distress.     Breath sounds: Normal breath sounds.  Abdominal:     General: Bowel sounds are normal. There is no distension.     Palpations: Abdomen is soft. There is no mass.     Tenderness: There is no abdominal tenderness. There is no guarding or rebound.  Genitourinary:    Vagina: Normal.  Musculoskeletal:        General: Normal range of motion.     Cervical back: Normal range of motion and neck supple.     Right lower leg: No edema.     Left lower leg: No edema.  Lymphadenopathy:     Cervical: No cervical adenopathy.  Skin:    General: Skin is warm and dry.     Findings: No erythema or rash.  Neurological:     Mental Status: She is alert and oriented to  person, place, and time.     Cranial Nerves: No cranial nerve deficit.     Deep Tendon Reflexes: Reflexes are normal and symmetric.  Psychiatric:        Mood and Affect: Mood normal.        Behavior: Behavior normal.        Thought Content: Thought content normal.        Judgment: Judgment normal.      No results found for any visits on 06/01/23.  Last CBC Lab  Results  Component Value Date   WBC 7.2 01/06/2023   HGB 13.5 01/06/2023   HCT 39.5 01/06/2023   MCV 90.2 01/06/2023   MCH 30.3 04/05/2020   RDW 13.5 01/06/2023   PLT 377.0 01/06/2023   Last metabolic panel Lab Results  Component Value Date   GLUCOSE 87 01/06/2023   NA 137 01/06/2023   K 4.2 01/06/2023   CL 98 01/06/2023   CO2 31 01/06/2023   BUN 15 01/06/2023   CREATININE 0.86 01/06/2023   GFR 75.00 01/06/2023   CALCIUM 9.6 01/06/2023   PROT 6.6 01/06/2023   ALBUMIN 4.0 01/06/2023   BILITOT 0.7 01/06/2023   ALKPHOS 88 01/06/2023   AST 15 01/06/2023   ALT 20 01/06/2023   ANIONGAP 11 02/01/2020   Last lipids Lab Results  Component Value Date   CHOL 283 (H) 01/06/2023   HDL 60.90 01/06/2023   LDLCALC 195 (H) 01/06/2023   TRIG 135.0 01/06/2023   CHOLHDL 5 01/06/2023   Last hemoglobin A1c No results found for: "HGBA1C" Last thyroid functions Lab Results  Component Value Date   TSH 3.44 01/06/2023   T4TOTAL 6.5 10/15/2020   Last vitamin D Lab Results  Component Value Date   VD25OH 43.75 01/06/2023   Last vitamin B12 and Folate Lab Results  Component Value Date   VITAMINB12 733 01/06/2023      The 10-year ASCVD risk score (Arnett DK, et al., 2019) is: 3.2%    Assessment & Plan:   Problem List Items Addressed This Visit       Unprioritized   Hypothyroidism   Relevant Orders   Thyroid Panel With TSH   CBC with Differential/Platelet   Comprehensive metabolic panel   Z61 deficiency   Relevant Orders   CBC with Differential/Platelet   Vitamin B12   Other Visit Diagnoses     Vitamin D deficiency    -  Primary   Relevant Orders   VITAMIN D 25 Hydroxy (Vit-D Deficiency, Fractures)   Allergy, initial encounter       Relevant Medications   fluticasone (FLONASE) 50 MCG/ACT nasal spray   PND (post-nasal drip)       Acute bronchitis, unspecified organism          Assessment and Plan    Hypertension Intermittent elevated blood pressure  readings have been noted at home, with a recent reading of 158/96, though readings in the clinic remain normal. There are concerns regarding the accuracy of the home blood pressure cuff size. Stress impact and the importance of accurate cuff size were discussed. It was also noted that Provigil can increase blood pressure. We will schedule a nurse visit to compare home and clinic blood pressure readings and recheck blood pressure in two weeks.  Attention Deficit Disorder (ADD) She has long-standing concentration and focus issues consistent with ADD. Previous treatment with NAC was discontinued due to blood pressure concerns. The possibility of starting Provigil for ADD was discussed, considering its potential impact  on blood pressure. Provigil will be started after confirmation with Dr. Jennelle Human, and a follow-up appointment is scheduled two weeks after starting Provigil to monitor blood pressure.  Fatigue She reports persistent tiredness, which may be related to stress or thyroid function. Previous thyroid function tests in July were normal. Other causes such as vitamin deficiencies were discussed, and she reported a morning heart rate of 103. Blood work for thyroid function, vitamin B12, and vitamin D levels will be ordered.  Sinus Pressure She manages sinus pressure with Benadryl at night. The use of Flonase for additional relief was discussed. Flonase is prescribed and to be used as needed for sinus pressure.  General Health Maintenance The importance of avoiding exposure to illness was discussed, especially since she lives with someone with rheumatoid arthritis. There have been no recent surgeries or significant health changes. She is advised to avoid exposure to individuals with respiratory illnesses and to consider blood type testing for future reference.  Follow-up We will follow up with Dr. Jennelle Human regarding starting Provigil. A follow-up appointment is scheduled in two weeks to recheck blood  pressure after starting Provigil. We will also follow up on blood work results.       No follow-ups on file.    Donato Schultz, DO

## 2023-06-02 ENCOUNTER — Telehealth: Payer: Self-pay | Admitting: Family Medicine

## 2023-06-02 LAB — COMPREHENSIVE METABOLIC PANEL
ALT: 23 U/L (ref 0–35)
AST: 17 U/L (ref 0–37)
Albumin: 4.5 g/dL (ref 3.5–5.2)
Alkaline Phosphatase: 103 U/L (ref 39–117)
BUN: 19 mg/dL (ref 6–23)
CO2: 32 meq/L (ref 19–32)
Calcium: 9.8 mg/dL (ref 8.4–10.5)
Chloride: 95 meq/L — ABNORMAL LOW (ref 96–112)
Creatinine, Ser: 1.03 mg/dL (ref 0.40–1.20)
GFR: 60.23 mL/min (ref >60.00)
Glucose, Bld: 73 mg/dL (ref 70–99)
Potassium: 4 meq/L (ref 3.5–5.1)
Sodium: 136 meq/L (ref 135–145)
Total Bilirubin: 0.8 mg/dL (ref 0.2–1.2)
Total Protein: 7.1 g/dL (ref 6.0–8.3)

## 2023-06-02 LAB — THYROID PANEL WITH TSH
Free Thyroxine Index: 1.8 (ref 1.4–3.8)
T3 Uptake: 25 % (ref 22–35)
T4, Total: 7.1 ug/dL (ref 5.1–11.9)
TSH: 2.02 m[IU]/L (ref 0.40–4.50)

## 2023-06-02 LAB — CBC WITH DIFFERENTIAL/PLATELET
Basophils Absolute: 0.1 10*3/uL (ref 0.0–0.1)
Basophils Relative: 0.8 % (ref 0.0–3.0)
Eosinophils Absolute: 0.2 10*3/uL (ref 0.0–0.7)
Eosinophils Relative: 2 % (ref 0.0–5.0)
HCT: 44.3 % (ref 36.0–46.0)
Hemoglobin: 15.2 g/dL — ABNORMAL HIGH (ref 12.0–15.0)
Lymphocytes Relative: 20.2 % (ref 12.0–46.0)
Lymphs Abs: 1.7 10*3/uL (ref 0.7–4.0)
MCHC: 34.2 g/dL (ref 30.0–36.0)
MCV: 91.4 fL (ref 78.0–100.0)
Monocytes Absolute: 0.5 10*3/uL (ref 0.1–1.0)
Monocytes Relative: 6.4 % (ref 3.0–12.0)
Neutro Abs: 6 10*3/uL (ref 1.4–7.7)
Neutrophils Relative %: 70.6 % (ref 43.0–77.0)
Platelets: 381 10*3/uL (ref 150.0–400.0)
RBC: 4.85 Mil/uL (ref 3.87–5.11)
RDW: 13.7 % (ref 11.5–15.5)
WBC: 8.5 10*3/uL (ref 4.0–10.5)

## 2023-06-02 LAB — VITAMIN D 25 HYDROXY (VIT D DEFICIENCY, FRACTURES): VITD: 46.54 ng/mL (ref 30.00–100.00)

## 2023-06-02 LAB — VITAMIN B12: Vitamin B-12: 670 pg/mL (ref 211–911)

## 2023-06-02 NOTE — Telephone Encounter (Signed)
Pt states she does not understand why she got a referral for a hernia. States she has never spoken about having a hernia. Please advise.

## 2023-06-08 NOTE — Telephone Encounter (Signed)
Left detailed message on machine for pt to disregard referral.

## 2023-06-08 NOTE — Addendum Note (Signed)
Addended by: Thelma Barge D on: 06/08/2023 03:06 PM   Modules accepted: Orders

## 2023-06-16 ENCOUNTER — Ambulatory Visit: Payer: Medicaid Other | Admitting: Psychiatry

## 2023-07-08 ENCOUNTER — Telehealth: Payer: Self-pay | Admitting: Family Medicine

## 2023-07-08 NOTE — Telephone Encounter (Signed)
 Copied from CRM [id number]. Topic: Referral - Status >> Jul 08, 2023  2:28 PM Robinson H wrote: Reason for CRM: Patient calling to check the status of referral request submitted on 12/19 to Neurology for learning disability, please reach out to patient. Thanks  [place] [phone]

## 2023-07-08 NOTE — Telephone Encounter (Signed)
 Was a referral to Neuro supposed to be placed? Please advise?

## 2023-07-21 ENCOUNTER — Telehealth: Payer: Self-pay | Admitting: Family Medicine

## 2023-07-21 NOTE — Telephone Encounter (Signed)
 Copied from CRM [id number]. Topic: Referral - Status >> Jul 21, 2023 11:18 AM Jenna Arroyo wrote: Reason for CRM: THERE WAS A REFERRAL PUT IN ON 06/24/2023 AND IT HAS NOT BEEN WORKED ON. CAN SOMEONE PLEASE ASSIST THIS PATIENT WITH GETTING THIS REFERRAL TAKEN CARE OF. PATIENT STATES SHE REALLY NEED THIS APPOINTMENT. PLEASE CONTACT AT [facility]

## 2023-07-21 NOTE — Telephone Encounter (Signed)
 Pt called. Appt made for Friday

## 2023-07-23 ENCOUNTER — Encounter: Payer: Self-pay | Admitting: Family Medicine

## 2023-07-23 ENCOUNTER — Ambulatory Visit (INDEPENDENT_AMBULATORY_CARE_PROVIDER_SITE_OTHER): Payer: Medicaid Other | Admitting: Family Medicine

## 2023-07-23 VITALS — BP 110/88 | HR 85 | Temp 98.6°F | Resp 18 | Ht 66.0 in | Wt 210.8 lb

## 2023-07-23 DIAGNOSIS — I10 Essential (primary) hypertension: Secondary | ICD-10-CM | POA: Diagnosis not present

## 2023-07-23 DIAGNOSIS — F819 Developmental disorder of scholastic skills, unspecified: Secondary | ICD-10-CM | POA: Diagnosis not present

## 2023-07-23 NOTE — Patient Instructions (Signed)
I have placed a referral to [facility] for the neuropsychologist  If you have not heard anything in 2 weeks call [facility] neurology

## 2023-07-23 NOTE — Progress Notes (Signed)
Established Patient Office Visit  Subjective   Patient ID: Jenna Arroyo, female    DOB: [birth year]  Age: 58 y.o. MRN: [id number]  Chief Complaint  Patient presents with   Learning Problems    HPI Discussed the use of AI scribe software for clinical note transcription with the patient, who gave verbal consent to proceed.  History of Present Illness   The patient presents with concerns of a potential auditory processing disorder, as suggested by a friend with a background in psychology. She reports instances of miscommunication, where she believes she understands and responds appropriately to conversations, but others indicate otherwise. The patient recalls a history of a concussion, which she believes may be contributing to this issue. She also has known learning disabilities, which have not been adequately addressed due to financial constraints.  The patient is seeking evaluation from a neuropsychiatrist to confirm this diagnosis and guide management. She is aware of the potential wait time for an appointment and is willing to travel for care.  In addition to these concerns, the patient mentions a recurring pilonidal cyst, which she has been attempting to manage with home remedies, including tea tree oil and castor oil. She expresses a desire to avoid increasing her antidepressant medication due to concerns about potential effects on blood pressure.  The patient is currently living in a stressful environment with a roommate, which she believes may be contributing to her fluctuating blood pressure. She is actively seeking alternative living arrangements, including potential involvement in a tiny house community project. 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  The patient also mentions a past appendectomy, performed approximately ten years ago, and expresses curiosity about her blood type. She understands that this  information is typically only necessary for surgical procedures and is considering seeking this information from the hospital where the surgery was performed.      Patient Active Problem List   Diagnosis Date Noted   Cervical spine pain 12/08/2022   Chronic midline thoracic back pain 12/08/2022   Onychomycosis of great toe 12/08/2022   B12 deficiency 12/08/2022   Other fatigue 12/08/2022   Intertrigo 12/08/2022   Cervical strain 02/18/2021   Dyspepsia 02/18/2021   Urinary frequency 12/06/2020   Hyponatremia 09/23/2020   Anemia 06/14/2020   Nausea 04/05/2020   History of panic attacks 01/23/2019   GAD (generalized anxiety disorder) 04/22/2018   ADD (attention deficit disorder) 04/22/2018   Pharyngitis 12/11/2016   Axillary lymphadenopathy 12/11/2016   Lyme disease 12/11/2016   Dyslipidemia 06/18/2016   Essential hypertension 06/18/2016   Hypothyroidism 06/18/2016   Past Medical History:  Diagnosis Date   Anxiety    High cholesterol    Hypertension    Lyme disease    Thyroid disease    Past Surgical History:  Procedure Laterality Date   ABDOMINAL EXPLORATION SURGERY     APPENDECTOMY     MASTOIDECTOMY     TONSILECTOMY/ADENOIDECTOMY WITH MYRINGOTOMY     Social History   Tobacco Use   Smoking status: Former    Current packs/day: 0.00    Types: Cigarettes    Quit date: 2006    Years since quitting: 19.0   Smokeless tobacco: Never   Tobacco comments:    socially   Vaping Use   Vaping status: Never Used  Substance Use Topics   Alcohol use: No   Drug use: No   Social History   Socioeconomic History  Marital status: Divorced    Spouse name: Not on file   Number of children: Not on file   Years of education: Not on file   Highest education level: Not on file  Occupational History   Not on file  Tobacco Use   Smoking status: Former    Current packs/day: 0.00    Types: Cigarettes    Quit date: 2006    Years since quitting: 19.0   Smokeless tobacco: Never    Tobacco comments:    socially   Vaping Use   Vaping status: Never Used  Substance and Sexual Activity   Alcohol use: No   Drug use: No   Sexual activity: Not Currently    Birth control/protection: Post-menopausal  Other Topics Concern   Not on file  Social History Narrative   Not on file   Social Drivers of Health   Financial Resource Strain: Not on file  Food Insecurity: No Food Insecurity (11/25/2020)   Received from [facility], [facility]   Hunger Vital Sign    Worried About Running Out of Food in the Last Year: Never true    Ran Out of Food in the Last Year: Never true  Transportation Needs: No Transportation Needs (06/18/2020)   PRAPARE - Administrator, Civil Service (Medical): No    Lack of Transportation (Non-Medical): No  Physical Activity: Not on file  Stress: Not on file  Social Connections: Unknown (11/15/2021)   Received from [facility], [facility]   Social Network    Social Network: Not on file  Intimate Partner Violence: Unknown (10/07/2021)   Received from [facility], [facility]   HITS    Physically Hurt: Not on file    Insult or Talk Down To: Not on file    Threaten Physical Harm: Not on file    Scream or Curse: Not on file   Family Status  Relation Name Status   Mother Asencion Islam Deceased at age 65   Father Mr. Threasa Beards   Sister [address] Alive   Brother Billy,49 Alive   Son Jordan,29 Alive   Neg Hx  (Not Specified)  No partnership data on file   Family History  Problem Relation Age of Onset   Heart failure Mother    Hypertension Mother    High Cholesterol Mother    Heart disease Father    High Cholesterol Father    Dementia Father    Hypertension Sister    High Cholesterol Sister    Hypertension Brother    High Cholesterol Brother    Colon cancer Neg Hx    Allergies  Allergen Reactions   Ceftriaxone Sodium In Dextrose Other (See Comments)    "heart races and cannot breathe"   Guaifenesin & Derivatives  Other (See Comments)    "heart races, dizziness, light-headedness"   Meperidine Other (See Comments)    "heart races"   Other Rash   Penicillins Hives   Pseudoephedrine Other (See Comments)    "heart races and high blood pressure"   Rosuvastatin Calcium Nausea And Vomiting    Muscle aches    Simvastatin Other (See Comments)    muscle aching    Ciprofloxacin    Rocephin [Ceftriaxone] Itching      Review of Systems  Constitutional:  Negative for fever and malaise/fatigue.  HENT:  Negative for congestion.   Eyes:  Negative for blurred vision.  Respiratory:  Negative for shortness of breath.   Cardiovascular:  Negative for chest pain,  palpitations and leg swelling.  Gastrointestinal:  Negative for abdominal pain, blood in stool and nausea.  Genitourinary:  Negative for dysuria and frequency.  Musculoskeletal:  Negative for falls.  Skin:  Negative for rash.  Neurological:  Negative for dizziness, loss of consciousness and headaches.  Endo/Heme/Allergies:  Negative for environmental allergies.  Psychiatric/Behavioral:  Negative for depression and suicidal ideas. The patient is not nervous/anxious.       Objective:     BP 110/88 (BP Location: Left Arm, Patient Position: Sitting, Cuff Size: Large)   Pulse 85   Temp 98.6 F (37 C) (Oral)   Resp 18   Ht 5\' 6"  (1.676 m)   Wt 210 lb 12.8 oz (95.6 kg)   SpO2 99%   BMI 34.02 kg/m  BP Readings from Last 3 Encounters:  07/23/23 110/88  06/01/23 112/70  03/23/23 120/78   Wt Readings from Last 3 Encounters:  07/23/23 210 lb 12.8 oz (95.6 kg)  06/01/23 202 lb 3.2 oz (91.7 kg)  03/23/23 204 lb 3.2 oz (92.6 kg)   SpO2 Readings from Last 3 Encounters:  07/23/23 99%  06/01/23 97%  03/23/23 98%      Physical Exam Vitals and nursing note reviewed.  Constitutional:      General: She is not in acute distress.    Appearance: Normal appearance. She is well-developed.  HENT:     Head: Normocephalic and atraumatic.  Eyes:      General: No scleral icterus.       Right eye: No discharge.        Left eye: No discharge.  Cardiovascular:     Rate and Rhythm: Normal rate and regular rhythm.     Heart sounds: No murmur heard. Pulmonary:     Effort: Pulmonary effort is normal. No respiratory distress.     Breath sounds: Normal breath sounds.  Musculoskeletal:        General: Normal range of motion.     Cervical back: Normal range of motion and neck supple.     Right lower leg: No edema.     Left lower leg: No edema.  Skin:    General: Skin is warm and dry.  Neurological:     Mental Status: She is alert and oriented to person, place, and time.  Psychiatric:        Mood and Affect: Mood normal.        Behavior: Behavior normal.        Thought Content: Thought content normal.        Judgment: Judgment normal.      No results found for any visits on 07/23/23.  Last CBC Lab Results  Component Value Date   WBC 8.5 06/01/2023   HGB 15.2 (H) 06/01/2023   HCT 44.3 06/01/2023   MCV 91.4 06/01/2023   MCH 30.3 04/05/2020   RDW 13.7 06/01/2023   PLT 381.0 06/01/2023   Last metabolic panel Lab Results  Component Value Date   GLUCOSE 73 06/01/2023   NA 136 06/01/2023   K 4.0 06/01/2023   CL 95 (L) 06/01/2023   CO2 32 06/01/2023   BUN 19 06/01/2023   CREATININE 1.03 06/01/2023   GFR 60.23 06/01/2023   CALCIUM 9.8 06/01/2023   PROT 7.1 06/01/2023   ALBUMIN 4.5 06/01/2023   BILITOT 0.8 06/01/2023   ALKPHOS 103 06/01/2023   AST 17 06/01/2023   ALT 23 06/01/2023   ANIONGAP 11 02/01/2020   Last lipids Lab Results  Component Value Date  CHOL 283 (H) 01/06/2023   HDL 60.90 01/06/2023   LDLCALC 195 (H) 01/06/2023   TRIG 135.0 01/06/2023   CHOLHDL 5 01/06/2023   Last hemoglobin A1c No results found for: "HGBA1C" Last thyroid functions Lab Results  Component Value Date   TSH 2.02 06/01/2023   T4TOTAL 7.1 06/01/2023   Last vitamin D Lab Results  Component Value Date   VD25OH 46.54 06/01/2023    Last vitamin B12 and Folate Lab Results  Component Value Date   VITAMINB12 670 06/01/2023      The 10-year ASCVD risk score (Arnett DK, et al., 2019) is: 3.1%    Assessment & Plan:   Problem List Items Addressed This Visit   None Visit Diagnoses       Learning disability    -  Primary   Relevant Orders   Ambulatory referral to Neuropsychology     Primary hypertension         Assessment and Plan    Auditory Processing Disorder Experiences difficulty processing auditory information despite normal hearing, possibly due to a history of concussion. Prefers evaluation at [facility] for its proximity and familiarity. Refer to a neuropsychiatrist at [facility].  Hypertension Blood pressure fluctuates, likely due to stress and living conditions. Prefers not to increase antidepressant dosage to avoid elevating blood pressure. Monitor blood pressure regularly.  Pilonidal Cyst Recurrent pilonidal cyst is being treated with tea tree oil and castor oil, though effectiveness is doubted. Discussed the potential need for medical or surgical intervention if home remedies fail. Consider medical or surgical treatment if home remedies are ineffective.  General Health Maintenance Inquired about blood type, which is not currently documented. Advised to check with [facility] for previous records due to a history of passing out during blood donation. Check with [facility] for previous blood type records and consider type and crossmatch if future surgery is planned.  Follow-up Follow up with [facility] if no call is received within a few weeks.        No follow-ups on file.    Donato Schultz, DO

## 2023-08-08 ENCOUNTER — Other Ambulatory Visit: Payer: Self-pay | Admitting: Psychiatry

## 2023-08-08 DIAGNOSIS — F4001 Agoraphobia with panic disorder: Secondary | ICD-10-CM

## 2023-08-08 DIAGNOSIS — F411 Generalized anxiety disorder: Secondary | ICD-10-CM

## 2023-08-09 ENCOUNTER — Ambulatory Visit: Payer: Self-pay | Admitting: Family Medicine

## 2023-08-09 NOTE — Telephone Encounter (Signed)
Copied from CRM [id number]. Topic: Clinical - Red Word Triage >> Aug 09, 2023  8:45 AM Gaetano Hawthorne wrote: Red Word that prompted transfer to Nurse Triage: Patient was walking and noticed some swelling and a vein over her right ankle.   Chief Complaint: right ankle swelling "puffy" Symptoms: pain with walking 4/10 Frequency: ongoing since last night  Pertinent Negatives: Patient denies calf pain or redness Disposition: [] ED /[] Urgent Care (no appt availability in office) / [x] Appointment(In office/virtual)/ []  [facility]/ [] Home Care/ [] Refused Recommended Disposition /[] [facility]/ []  Follow-up with PCP Additional Notes: The patient reported that she walked 5 miles Saturday.  Sunday evening she noticed mild swelling in her right ankle and soreness with walking, 4/10.  She denied pain at rest.  She was scheduled for a next day appointment for further assessment.  Reason for Disposition  MILD or MODERATE ankle swelling (e.g., can't move joint normally, can't do usual activities) (Exceptions: Itchy, localized swelling; swelling is chronic.)  Answer Assessment - Initial Assessment Questions 1. LOCATION: "Which ankle is swollen?" "Where is the swelling?"     Right ankle  2. ONSET: "When did the swelling start?"     08/09/23 at night  3. SWELLING: "How bad is the swelling?" Or, "How large is it?" (e.g., mild, moderate, severe; size of localized swelling)    - NONE: No joint swelling.   - LOCALIZED: Localized; small area of puffy or swollen skin (e.g., insect bite, skin irritation).   - MILD: Joint looks or feels mildly swollen or puffy.   - MODERATE: Swollen; interferes with normal activities (e.g., work or school); decreased range of movement; may be limping.   - SEVERE: Very swollen; can't move swollen joint at all; limping a lot or unable to walk.     Puffy and sore with walking  4. PAIN: "Is there any pain?" If Yes, ask: "How bad is it?" (Scale 1-10; or mild, moderate,  severe)   - NONE (0): no pain.   - MILD (1-3): doesn't interfere with normal activities.    - MODERATE (4-7): interferes with normal activities (e.g., work or school) or awakens from sleep, limping.    - SEVERE (8-10): excruciating pain, unable to do any normal activities, unable to walk.      4/10 when walking None at rest  5. CAUSE: "What do you think caused the ankle swelling?"     Walked 5 miles Saturday  6. OTHER SYMPTOMS: "Do you have any other symptoms?" (e.g., fever, chest pain, difficulty breathing, calf pain)     None  Protocols used: Ankle Swelling-A-AH

## 2023-08-10 ENCOUNTER — Ambulatory Visit: Payer: Medicaid Other | Admitting: Family Medicine

## 2023-08-10 ENCOUNTER — Ambulatory Visit: Payer: Medicaid Other | Admitting: Psychiatry

## 2023-08-10 ENCOUNTER — Encounter: Payer: Self-pay | Admitting: Psychiatry

## 2023-08-10 ENCOUNTER — Encounter: Payer: Self-pay | Admitting: Family Medicine

## 2023-08-10 VITALS — BP 112/78 | HR 74 | Temp 98.2°F | Resp 18 | Ht 66.0 in | Wt 208.2 lb

## 2023-08-10 DIAGNOSIS — S93401A Sprain of unspecified ligament of right ankle, initial encounter: Secondary | ICD-10-CM | POA: Diagnosis not present

## 2023-08-10 DIAGNOSIS — Z8659 Personal history of other mental and behavioral disorders: Secondary | ICD-10-CM

## 2023-08-10 DIAGNOSIS — Z638 Other specified problems related to primary support group: Secondary | ICD-10-CM

## 2023-08-10 DIAGNOSIS — F900 Attention-deficit hyperactivity disorder, predominantly inattentive type: Secondary | ICD-10-CM | POA: Diagnosis not present

## 2023-08-10 DIAGNOSIS — F411 Generalized anxiety disorder: Secondary | ICD-10-CM | POA: Diagnosis not present

## 2023-08-10 DIAGNOSIS — E538 Deficiency of other specified B group vitamins: Secondary | ICD-10-CM

## 2023-08-10 DIAGNOSIS — F4001 Agoraphobia with panic disorder: Secondary | ICD-10-CM

## 2023-08-10 MED ORDER — ALPRAZOLAM 0.5 MG PO TABS
ORAL_TABLET | ORAL | 4 refills | Status: DC
Start: 2023-08-10 — End: 2023-11-08

## 2023-08-10 MED ORDER — DULOXETINE HCL 30 MG PO CPEP: 30.0000 mg | ORAL_CAPSULE | Freq: Every day | ORAL | 0 refills | Status: DC

## 2023-08-10 MED ORDER — DULOXETINE HCL 20 MG PO CPEP
20.0000 mg | ORAL_CAPSULE | Freq: Every day | ORAL | 0 refills | Status: DC
Start: 2023-08-10 — End: 2023-09-08

## 2023-08-10 MED ORDER — CYANOCOBALAMIN 1000 MCG/ML IJ SOLN
1000.0000 ug | Freq: Once | INTRAMUSCULAR | Status: AC
Start: 2023-08-10 — End: 2023-08-10
  Administered 2023-08-10: 1000 ug via INTRAMUSCULAR

## 2023-08-10 NOTE — Progress Notes (Signed)
 Jenna Arroyo [id number] 1965-11-20 58 y.o.    Subjective:   Patient ID:  Jenna Arroyo is a 58 y.o. (DOB 10/11/1965) female.  Chief Complaint:  No chief complaint on file.   Anxiety Symptoms include nervous/anxious behavior. Patient reports no chest pain, confusion, decreased concentration, palpitations or suicidal ideas.     Jenna Arroyo presents to the office today for follow-up of anxiety and mood.  seen October 2020.  She had been having cognitive complaints as previously noted. Still recommend trial switch to Ativan  1 mg QID to see if cognition is better.  Her cognitive problems have frequently been in a contributor to her difficulty maintaining employment.  She is fearful about having Xanax  withdrawal.  This was discussed in detail.  Also discussed in detail the potential sedative effects of benzodiazepines and especially in the first week of the transition from 1 of the medications to another.  It is likely that she will be more successful with the transition if she is a little more sleepy than normal the first week by using half of each medication then if we abruptly switch medications.  That has a much lower success rate in her case because of her worry about the transition. She agrees to a trial.  Will do 1/2 of 0.5 mg tablet tablet of Xanax  and 1/2 of 1 mg tablet Ativan  QID for 1 week. Then DC alprazolam  and take Ativan  1 mg 3 times daily or 4 times daily to control anxiety.  Call back in 2 to 3 weeks to give a report of how well she is doing with the new medication. DC hydroxyzine  because of its potential to cause cognitive problems. Continue duloxetine  60 mg daily.  10/18/19 appt with the following noted and no further med changes: She had difficulty implementing the transition and then complained of insomnia with lorazepam  and was switched back to alprazolam  after a week.  Also CO pain and nausea.   Overall doing OK except son and D upset with her since December.  Disc this in  detail.  Hasn't seen GD's since then.  Working at job she loves but very PT. Exercising and lost 18#  02/21/20 with the following noted: Hanging in there with stress.  Not allowed to see GD still.  Doesn't know why. No Covid.  OCC BP spikes. No major med changes. Satisfied with duloxetine  and Xanax .  No full panic.   Sleep waxes and wanes but lately good.  Working same job as in April and likes it. Plan: no med changes  08/21/2020 appointment with the following noted: Stopped duloxetine  for a week early Feb over BP concerns from it.  Convinced BP affected by 20 mg duloxetine  which she's been taking for a few months.  Felt 30 mg was too high and then it started causing problems.  When off the duloxetine  for a week then BP went down. Times when doesn't have it and the most noticeable thing is feeling tired and a little disconnected.  Longest off it was 2-4 weeks in last 6 mos. Has unresponsive PCP. Chronic tiredness.  Still dealing with chronic Lyme dz.  Plan: Stop duloxetine  BC of BP concerns.  Monitor and record BP and share with PCP.  Stay off for a full month and if consistently more anxious then can add pure SSRI low dose Lexapro  5 mg for anxiety.  11/18/20 TC Pt had asked to increase Lexapro  to 10 mg while mo in the hospital.  11/25/20 TC:  Pt  contacted doctor on call this weekend concerning her issues with Lexapro  10 mg. Since the increase to 10 mg she has been unable to sleep, reports black circles under her eyes, gets to sleep about 1-2 am. She also reports on Saturday she had a anxiety attack, she felt it was medication related not just her anxiety. Jenna Mozingo, NP advised her to stop Lexapro  and nurse would contact her Monday.  She asks about trying just the Lexapro  5 mg, she reports it helped and it didn't effect her sleep like 10 mg.  Pt stated the lexapro  made her have increased anxiety and she thinks its best if she goes back to a very low dose of Cymbalta .She stopped Cymbalta  due  to it spiking her blood pressure.She says her bp was fine at the 20 mg dose. MD: ok to stop Lexapro  and return to duloxetine  20  12/11/20 appt noted: Under more stress and more anxious.  Stress about 3 mos.  Dealing with condo where she lives.  Not making enough money to get basic things she needs and asking for money from parents.  Wants father to fix things in the condo but he doesn't do anything.   Place had a leak in the past.    Taking duloxetine  20 and off Lexapro  and on alprazolam  0.5 mg QID.   Worries over the condition in the world and thinks the world could end and she might not be able to get the Xanax  and fears withdrawal.    She feels bible is predicting end of the world soon.  But no thoughts of death were noted Plan: no med changes per her request.  02/11/2021 appointment with the following noted: Family sold condo she was in and so she moved back in with parents about 3 weeks ago.  Stressed.  Has been seeking disability for a couple of years.  Doesn't think she can work FT.  Other relationships are stressed.   Chronic difficulty with organization. Walking and losing weight but worries about BP so doesn't want to increase the duloxetine . Patient reports stable mood and denies depressed or irritable moods.  Patient has chronic difficulty with anxiety and stress. Denies appetite disturbance.  Patient reports that energy and motivation have been good.  Patient denies any difficulty with concentration.  Patient denies any suicidal ideation. No panic. Sleep is OK. Using Xanax  mostly TID.  On duloxetine  20 mg daily and doesn't want to change it.. Sleep better in the day.    No questions concerns re: meds. Plan no med changes per her request  06/19/2021 appointment with the following noted: Working Therapist, Nutritional retirement.  Job offers [employer] FT Conflict with sister over dependence on her parents.  Feels her sister is unreasonable.  Can't handle being under that control.  Was  doing good until she started in.  Blocked her contact.  Sister blames her for parents not having enough money. Siblings forcing her to get a job. Satisfied with the meds. Upset at having to pay all her bills on her own now. Recognizes chronic difficulties managing herself well.  12/29/21 appt noted: Doing OK.  Family turned son against her.  It's tough.  Managing anxiety on current meds.  Don't want tto change the meds. Still grieving mother and dealing with family drama. On duloxetine  20 mg daily and Xanax  0.5 mg TID. Doesn't want to increase duloxetine  20 mg daily bc it increases BP.  04/24/2022 phone call complaining of depression and anxiety with low motivation  and difficulty functioning at work.  Wants to increase duloxetine . MD response to increase duloxetine  to 40 mg daily 04/27/2022 phone call patient indicated she wanted to go up in the dose slowly and wanted a prescription for duloxetine  30 mg daily.  05/27/2022 appointment noted: Current psych relevant meds include vitamin D  50,000 units weekly, hydroxyzine  25 mg every 8 hours as needed anxiety,  duloxetine  30 mg capsules 1 daily ( but hasn't started yet.  Plans to tomoroow) , Xanax  0.5 mg tablets 1 5 times a day as needed anxiety.  Patient states she is taking about 3 daily. Pretty rough still trying to find another job. M died 15-Sep-2021 and she was in such a fog. Traumatized  by mother's death.  Shouldn't have happened. She believes F was narcissist.  He's tuned my son against me.  Conflict with all of the family.  Not ready to talk to them about it.  Estranged from family in part over eviction from family's property.   Living with mother's good friend. Plans to increase duloxetine  20 to 30 mg daily. Some arthritis issues.  08/06/22 TC about particular technique for tapering Xanax .  She was advised this is complicated and should be discussed at an appointment.  10/01/22 appt noted: Current Alprazolam  0.5 mg TID, duloxetine  30 mg  daily.   No SE. Thinks duloxetine  15 would be the perfect dose.  60 is too much.  Feels better with 30 mg daily. Back to walking and BP has gone down. Biggest stress job interviews and trying to find decent paying job.   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 Last employed before [employer], lasted 3 mos.  Didn't feel adequate training.   Disc her phone call about tapering Xanax .  Fears shortages of the med in the future giving what's going on in world.  Plan: No med changes today.  03/24/23 appt noted:  Taking duloxetine  30 mg daily, alprazolam  0.5 mg TID, hydroxyzine  prn but makes her drowsy. Thinks she needs to increase duloxetine . For depression.  At times feels disassociating from reality without psychosis.  Uses hydroxyzine  when feels pressured by others like assertive roommate.  Roommate can trigger disassociation.May need low rent housing.  Can't live off $15K / year.   Lost last job and needing to start new job with pressure to provide for herself.  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 Visiting church [place] and looking for a place to stay.   Real financial stress.   Still trouble learning and retaining information.  Plan: Increase for dep to Cymbalta  40 mg daily of duloxetine  for chronic anxiety.    08/10/23 appt noted: Taking duloxetine  40 mg daily, alprazolam  0.5 mg TID, hydroxyzine  prn but makes her drowsy. Modafinil  100 mg tab, 1/2 Am prn. Stress women living with is narcissist and stressful.  I might have to stay at the [facility].  No car yet.  Would live out of car.  Hurts most that son doesn't care.   Couldn't keep last job bc they didn't train.  Has interview for [employer]. She thinks incr duloxetine  with minimal effect but didn't affect BP.   Lately dep and anxiety with stressors.  With landlord. Interest in increasing duloxetine  to 50 mg daily.  Past Psychiatric Medication Trials: Duloxetine  60, imipramine, desipramine,  sertraline hyper, citalopram side effects, paroxetine chest pain,  Lexapro  ? Less effective for anxiety Nuvigil edgy, provigil    buspirone dizzy,  Xanax , clonazepam Ambien anxiety, trazodone side  effects,  atenolol   Review of Systems:  Review of Systems  Constitutional:  Positive for fatigue.       Low exercise tolerance  Cardiovascular:  Negative for chest pain and palpitations.  Gastrointestinal:  Negative for vomiting.  Musculoskeletal:  Positive for arthralgias and neck pain.  Neurological:  Positive for headaches. Negative for tremors.       Foggy headed.  Psychiatric/Behavioral:  Positive for dysphoric mood and sleep disturbance. Negative for agitation, behavioral problems, confusion, decreased concentration, hallucinations, self-injury and suicidal ideas. The patient is nervous/anxious. The patient is not hyperactive.     Medications: I have reviewed the patient's current medications.  Current Outpatient Medications  Medication Sig Dispense Refill   albuterol  (VENTOLIN  HFA) 108 (90 Base) MCG/ACT inhaler Inhale 2 puffs into the lungs every 6 (six) hours as needed for wheezing or shortness of breath. 18 g 5   ALPRAZolam  (XANAX ) 0.5 MG tablet TAKE ONE TABLET EVERY MORNING, TAKE ONE TABLET DAILY AT NOON, TAKE ONE TABLET EVERY EVENING AND TAKE ONE TABLET DAILY AT BEDTIME 120 tablet 1   atenolol  (TENORMIN ) 25 MG tablet Take 1 tablet by mouth twice daily 180 tablet 0   Cholecalciferol (VITAMIN D ) 10 MCG/ML LIQD Take by mouth.     ciclopirox  (PENLAC ) 8 % solution Apply topically at bedtime. Apply over nail and surrounding skin. Apply daily over previous coat. After seven (7) days, may remove with alcohol and continue cycle. 6.6 mL 0   DULoxetine  (CYMBALTA ) 20 MG capsule Take 2 capsules by mouth once daily 60 capsule 0   EPINEPHrine  (EPIPEN  2-PAK) 0.3 mg/0.3 mL IJ SOAJ injection Inject 0.3 mg into the muscle as needed for anaphylaxis. 1 each 0   fluticasone  (FLONASE ) 50 MCG/ACT nasal  spray Place 2 sprays into both nostrils daily. 16 g 6   hydrOXYzine  (ATARAX ) 10 MG tablet TAKE 1 TO 2 TABLETS BY MOUTH EVERY 8 HOURS AS NEEDED FOR ANXIETY 60 tablet 2   irbesartan -hydrochlorothiazide  (AVALIDE) 150-12.5 MG tablet Take 2 tablets by mouth daily. 180 tablet 1   modafinil  (PROVIGIL ) 100 MG tablet Take 0.5 tablets (50 mg total) by mouth daily. 15 tablet 1   nystatin  cream (MYCOSTATIN ) Apply 1 Application topically 2 (two) times daily. 30 g 1   ofloxacin  (OCUFLOX ) 0.3 % ophthalmic solution Place 1 drop into the right eye every hour while awake.     silver  sulfADIAZINE  (SILVADENE ) 1 % cream Apply 1 application topically daily. 50 g 0   thyroid  (NP THYROID ) 15 MG tablet Take 3 tablets by mouth once daily 270 tablet 1   Vitamin D , Ergocalciferol , (DRISDOL ) 1.25 MG (50000 UNIT) CAPS capsule Take 1 capsule (50,000 Units total) by mouth once a week. 12 capsule 1   No current facility-administered medications for this visit.    Medication Side Effects: None  Allergies:  Allergies  Allergen Reactions   Ceftriaxone Sodium In Dextrose Other (See Comments)    heart races and cannot breathe   Guaifenesin & Derivatives Other (See Comments)    heart races, dizziness, light-headedness   Meperidine Other (See Comments)    heart races   Other Rash   Penicillins Hives   Pseudoephedrine Other (See Comments)    heart races and high blood pressure   Rosuvastatin Calcium Nausea And Vomiting    Muscle aches    Simvastatin Other (See Comments)    muscle aching    Ciprofloxacin    Rocephin [Ceftriaxone] Itching    Past Medical History:  Diagnosis Date  Anxiety    High cholesterol    Hypertension    Lyme disease    Thyroid  disease     Family History  Problem Relation Age of Onset   Heart failure Mother    Hypertension Mother    High Cholesterol Mother    Heart disease Father    High Cholesterol Father    Dementia Father    Hypertension Sister    High Cholesterol Sister     Hypertension Brother    High Cholesterol Brother    Colon cancer Neg Hx     Social History   Socioeconomic History   Marital status: Divorced    Spouse name: Not on file   Number of children: Not on file   Years of education: Not on file   Highest education level: Not on file  Occupational History   Not on file  Tobacco Use   Smoking status: Former    Current packs/day: 0.00    Types: Cigarettes    Quit date: 2006    Years since quitting: 19.1   Smokeless tobacco: Never   Tobacco comments:    socially   Advertising Account Planner   Vaping status: Never Used  Substance and Sexual Activity   Alcohol use: No   Drug use: No   Sexual activity: Not Currently    Birth control/protection: Post-menopausal  Other Topics Concern   Not on file  Social History Narrative   Not on file   Social Drivers of Health   Financial Resource Strain: Not on file  Food Insecurity: No Food Insecurity (11/25/2020)   Received from [facility], [facility]   Hunger Vital Sign    Worried About Running Out of Food in the Last Year: Never true    Ran Out of Food in the Last Year: Never true  Transportation Needs: No Transportation Needs (06/18/2020)   PRAPARE - Administrator, Civil Service (Medical): No    Lack of Transportation (Non-Medical): No  Physical Activity: Not on file  Stress: Not on file  Social Connections: Unknown (11/15/2021)   [facility], [facility]   Social Network    Social Network: Not on file  Intimate Partner Violence: Unknown (10/07/2021)   Received from [facility], [facility]   HITS    Physically Hurt: Not on file    Insult or Talk Down To: Not on file    Threaten Physical Harm: Not on file    Scream or Curse: Not on file    Past Medical History, Surgical history, Social history, and Family history were reviewed and updated as appropriate.   Please see review of systems for further details on the patient's review from today.    Objective:   Physical Exam:  There were no vitals taken for this visit.  Physical Exam Constitutional:      General: She is not in acute distress. Musculoskeletal:        General: No deformity.  Neurological:     Mental Status: She is alert and oriented to person, place, and time.     Cranial Nerves: No dysarthria.     Coordination: Coordination normal.  Psychiatric:        Attention and Perception: Perception normal. She is inattentive. She does not perceive auditory or visual hallucinations.        Mood and Affect: Mood is anxious and depressed. Affect is not labile, angry, tearful or inappropriate.        Speech: Speech normal.  Speech is not slurred.        Behavior: Behavior normal. Behavior is cooperative.        Thought Content: Thought content normal. Thought content is not paranoid or delusional. Thought content does not include homicidal or suicidal ideation. Thought content does not include suicidal plan.        Cognition and Memory: Cognition and memory normal.        Judgment: Judgment normal.     Comments: Insight fair.   chronically low stress tolerance.  Chronic stress.. Complain of chronic forgetfulness Talkative and need      Lab Review:     Component Value Date/Time   NA 136 06/01/2023 1421   K 4.0 06/01/2023 1421   CL 95 (L) 06/01/2023 1421   CO2 32 06/01/2023 1421   GLUCOSE 73 06/01/2023 1421   BUN 19 06/01/2023 1421   CREATININE 1.03 06/01/2023 1421   CREATININE 0.85 04/05/2020 1052   CALCIUM 9.8 06/01/2023 1421   PROT 7.1 06/01/2023 1421   ALBUMIN 4.5 06/01/2023 1421   AST 17 06/01/2023 1421   ALT 23 06/01/2023 1421   ALKPHOS 103 06/01/2023 1421   BILITOT 0.8 06/01/2023 1421   GFRNONAA >60 02/01/2020 1513   GFRAA >60 02/01/2020 1513       Component Value Date/Time   WBC 8.5 06/01/2023 1421   RBC 4.85 06/01/2023 1421   HGB 15.2 (H) 06/01/2023 1421   HCT 44.3 06/01/2023 1421   PLT 381.0 06/01/2023 1421   MCV 91.4 06/01/2023 1421    MCH 30.3 04/05/2020 1052   MCHC 34.2 06/01/2023 1421   RDW 13.7 06/01/2023 1421   LYMPHSABS 1.7 06/01/2023 1421   MONOABS 0.5 06/01/2023 1421   EOSABS 0.2 06/01/2023 1421   BASOSABS 0.1 06/01/2023 1421    No results found for: POCLITH, LITHIUM   No results found for: PHENYTOIN, PHENOBARB, VALPROATE, CBMZ   .res Assessment: Plan:   Generalized anxiety disorder, manageable  Attention deficit hyperactivity disorder improved with duloxetine   History of panic attacks in remission with current meds  Chronic family conflict ongoing.   Patient has been under our care for many years.  She has chronic  instability , anxiety and dysfunction function but does generally get benefit from the medication.  She has not been able to reduce the dosage of alprazolam  which she has taken for many years due to excessive anxiety.  Chronic problems and conflicts with parents and sister and brother and other people  Increase for dep to Cymbalta  50 mg daily of duloxetine  for chronic anxiety.    She failed attempted transition from alprazolam  to lorazepam  in October 2020.  It appears she had some withdrawal symptoms and she is gone back to alprazolam .  It was discussed with her that we could do the transition more gradually if she wanted to pursue that to see if she can get cognitive benefit with the switch.  She does not want to do that at this time.  Anxiety interferes with cognition too.  Option switch to lorazepam .  Continue Xanax  0.5 mg QID, she's been able to limit to 3 daily.  She can continue to try hydroxyzine  10-20  mg every 8 hours as needed anxiety.  However if it does not help then do not take it.  Discussed side effect risks.. she doesn't want more meds.  Again extensively We discussed the short-term risks associated with benzodiazepines including sedation and increased fall risk among others.  Discussed long-term side effect risk including dependence,  potential withdrawal symptoms, and  the potential eventual dose-related risk of dementia.  But recent studies from 2020 dispute this association between benzodiazepines and dementia risk. Newer studies in 2020 do not support an association with dementia.   Has started counseling at [facility] Supportive and problem solving techniques with family conflict with sister and son and grief with mother's death still a problem.  Mother protected her with other family members.   Educated about her father's recent dx of dementia and what it means to have the dx. Supportive therapy dealing with family conflict.  Broken heart with my son.    Has apologized but won't be forgiven.  He has cut ties with her.  (NAC) N-Acetylcysteine 2 of the  600 mg capsules daily to help with mild cognitive problems.  It can be combined with a B-complex vitamin as the B-12 and folate which can sometimes enhance the effect.  Disc ADD tx.  Discussed potential benefits, risks, and side effects of stimulants with patient to include increased heart rate, palpitations, insomnia, increased anxiety, increased irritability, or decreased appetite.  Instructed patient to contact office if experiencing any significant tolerability issues. Option Retry modafinil  bc took before at 50 mg AM Less likely to cause anxiety than regular stimulants.    She asks about neuropsych for poss auditory processing disorder.  Good idea.   FU 3-4 mos  Lorene Macintosh, MD, DFAPA   Please see After Visit Summary for patient specific instructions.  Future Appointments  Date Time Provider Department Center  08/10/2023  2:40 PM Antonio Meth, Jamee R, DO [id number]       No orders of the defined types were placed in this encounter.     -------------------------------

## 2023-08-10 NOTE — Progress Notes (Signed)
 Established Patient Office Visit  Subjective   Patient ID: Jenna Arroyo, female    DOB: 08-15-1965  Age: 58 y.o. MRN: [id number]  Chief Complaint  Patient presents with   Ankle Pain    X2 days, Right ankle, pain and swelling    HPI Discussed the use of AI scribe software for clinical note transcription with the patient, who gave verbal consent to proceed.  History of Present Illness   Jenna Arroyo is a 57 year old female who presents with ankle pain and swelling.  She experiences throbbing pain in her ankle that began at 4 AM after walking five miles the previous day. Swelling is present on the side of the ankle and has decreased over time. She describes the sensation as feeling like 'water' in the ankle when sleeping. There is no recollection of twisting the ankle, but it has buckled underneath her a few times since the onset of pain. A large vein is noted over the area, but she is not concerned about it. She has been able to walk on the ankle, initially gingerly, but has since resumed normal walking due to daily activities. The pain is bearable and not severe enough to prevent walking.  She questions if her Brooks shoes might not be suitable for her, as she was wearing them during the walk when the pain began.  Her past medical history includes a significant leg fracture approximately eight years ago, which required hospitalization and cast removal due to severe swelling.      Patient Active Problem List   Diagnosis Date Noted   Cervical spine pain 12/08/2022   Chronic midline thoracic back pain 12/08/2022   Onychomycosis of great toe 12/08/2022   B12 deficiency 12/08/2022   Other fatigue 12/08/2022   Intertrigo 12/08/2022   Cervical strain 02/18/2021   Dyspepsia 02/18/2021   Urinary frequency 12/06/2020   Hyponatremia 09/23/2020   Anemia 06/14/2020   Nausea 04/05/2020   History of panic attacks 01/23/2019   GAD (generalized anxiety disorder) 04/22/2018   ADD (attention  deficit disorder) 04/22/2018   Pharyngitis 12/11/2016   Axillary lymphadenopathy 12/11/2016   Lyme disease 12/11/2016   Dyslipidemia 06/18/2016   Essential hypertension 06/18/2016   Hypothyroidism 06/18/2016   Past Medical History:  Diagnosis Date   Anxiety    High cholesterol    Hypertension    Lyme disease    Thyroid  disease    Past Surgical History:  Procedure Laterality Date   ABDOMINAL EXPLORATION SURGERY     APPENDECTOMY     MASTOIDECTOMY     TONSILECTOMY/ADENOIDECTOMY WITH MYRINGOTOMY     Social History   Tobacco Use   Smoking status: Former    Current packs/day: 0.00    Types: Cigarettes    Quit date: 2006    Years since quitting: 19.1   Smokeless tobacco: Never   Tobacco comments:    socially   Vaping Use   Vaping status: Never Used  Substance Use Topics   Alcohol use: No   Drug use: No   Social History   Socioeconomic History   Marital status: Divorced    Spouse name: Not on file   Number of children: Not on file   Years of education: Not on file   Highest education level: Not on file  Occupational History   Not on file  Tobacco Use   Smoking status: Former    Current packs/day: 0.00    Types: Cigarettes    Quit date: 2006  Years since quitting: 19.1   Smokeless tobacco: Never   Tobacco comments:    socially   Vaping Use   Vaping status: Never Used  Substance and Sexual Activity   Alcohol use: No   Drug use: No   Sexual activity: Not Currently    Birth control/protection: Post-menopausal  Other Topics Concern   Not on file  Social History Narrative   Not on file   Social Drivers of Health   Financial Resource Strain: Not on file  Food Insecurity: No Food Insecurity (11/25/2020)   Received from [facility]   Hunger Vital Sign    Worried About Running Out of Food in the Last Year: Never true    Ran Out of Food in the Last Year: Never true  Transportation Needs: No Transportation Needs (06/18/2020)   PRAPARE -  Administrator, Civil Service (Medical): No    Lack of Transportation (Non-Medical): No  Physical Activity: Not on file  Stress: Not on file  Social Connections: Unknown (11/15/2021)   Received from [facility]   Social Network    Social Network: Not on file  Intimate Partner Violence: Unknown (10/07/2021)   Received from [facility]   HITS    Physically Hurt: Not on file    Insult or Talk Down To: Not on file    Threaten Physical Harm: Not on file    Scream or Curse: Not on file   Family Status  Relation Name Status   Mother Antoine Deceased at age 78   Father Mr. Tanda Searles   Sister [address] Alive   Brother Billy,49 Alive   Son Jordan,29 Alive   Neg Hx  (Not Specified)  No partnership data on file   Family History  Problem Relation Age of Onset   Heart failure Mother    Hypertension Mother    High Cholesterol Mother    Heart disease Father    High Cholesterol Father    Dementia Father    Hypertension Sister    High Cholesterol Sister    Hypertension Brother    High Cholesterol Brother    Colon cancer Neg Hx    Allergies  Allergen Reactions   Ceftriaxone Sodium In Dextrose Other (See Comments)    heart races and cannot breathe   Guaifenesin & Derivatives Other (See Comments)    heart races, dizziness, light-headedness   Meperidine Other (See Comments)    heart races   Other Rash   Penicillins Hives   Pseudoephedrine Other (See Comments)    heart races and high blood pressure   Rosuvastatin Calcium Nausea And Vomiting    Muscle aches    Simvastatin Other (See Comments)    muscle aching    Ciprofloxacin    Rocephin [Ceftriaxone] Itching      Review of Systems  Constitutional:  Negative for fever and malaise/fatigue.  HENT:  Negative for congestion.   Eyes:  Negative for blurred vision.  Respiratory:  Negative for shortness of breath.   Cardiovascular:  Negative for chest pain, palpitations and leg  swelling.  Gastrointestinal:  Negative for abdominal pain, blood in stool and nausea.  Genitourinary:  Negative for dysuria and frequency.  Musculoskeletal:  Positive for joint pain. Negative for falls.  Skin:  Negative for rash.  Neurological:  Negative for dizziness, loss of consciousness and headaches.  Endo/Heme/Allergies:  Negative for environmental allergies.  Psychiatric/Behavioral:  Negative for depression. The patient is not nervous/anxious.  Objective:     BP 112/78 (BP Location: Left Arm, Patient Position: Sitting, Cuff Size: Large)   Pulse 74   Temp 98.2 F (36.8 C) (Oral)   Resp 18   Ht 5' 6 (1.676 m)   Wt 208 lb 3.2 oz (94.4 kg)   SpO2 99%   BMI 33.60 kg/m  BP Readings from Last 3 Encounters:  08/10/23 112/78  07/23/23 110/88  06/01/23 112/70   Wt Readings from Last 3 Encounters:  08/10/23 208 lb 3.2 oz (94.4 kg)  07/23/23 210 lb 12.8 oz (95.6 kg)  06/01/23 202 lb 3.2 oz (91.7 kg)   SpO2 Readings from Last 3 Encounters:  08/10/23 99%  07/23/23 99%  06/01/23 97%      Physical Exam Vitals and nursing note reviewed.  Constitutional:      General: She is not in acute distress.    Appearance: Normal appearance. She is well-developed.  HENT:     Head: Normocephalic and atraumatic.  Eyes:     General: No scleral icterus.       Right eye: No discharge.        Left eye: No discharge.  Cardiovascular:     Rate and Rhythm: Normal rate and regular rhythm.     Heart sounds: No murmur heard. Pulmonary:     Effort: Pulmonary effort is normal. No respiratory distress.     Breath sounds: Normal breath sounds.  Musculoskeletal:        General: Swelling and tenderness present. Normal range of motion.     Cervical back: Normal range of motion and neck supple.     Right lower leg: No edema.     Left lower leg: No edema.  Skin:    General: Skin is warm and dry.  Neurological:     Mental Status: She is alert and oriented to person, place, and time.   Psychiatric:        Mood and Affect: Mood normal.        Behavior: Behavior normal.        Thought Content: Thought content normal.        Judgment: Judgment normal.      No results found for any visits on 08/10/23.  Last CBC Lab Results  Component Value Date   WBC 8.5 06/01/2023   HGB 15.2 (H) 06/01/2023   HCT 44.3 06/01/2023   MCV 91.4 06/01/2023   MCH 30.3 04/05/2020   RDW 13.7 06/01/2023   PLT 381.0 06/01/2023   Last metabolic panel Lab Results  Component Value Date   GLUCOSE 73 06/01/2023   NA 136 06/01/2023   K 4.0 06/01/2023   CL 95 (L) 06/01/2023   CO2 32 06/01/2023   BUN 19 06/01/2023   CREATININE 1.03 06/01/2023   GFR 60.23 06/01/2023   CALCIUM 9.8 06/01/2023   PROT 7.1 06/01/2023   ALBUMIN 4.5 06/01/2023   BILITOT 0.8 06/01/2023   ALKPHOS 103 06/01/2023   AST 17 06/01/2023   ALT 23 06/01/2023   ANIONGAP 11 02/01/2020   Last lipids Lab Results  Component Value Date   CHOL 283 (H) 01/06/2023   HDL 60.90 01/06/2023   LDLCALC 195 (H) 01/06/2023   TRIG 135.0 01/06/2023   CHOLHDL 5 01/06/2023   Last hemoglobin A1c No results found for: HGBA1C Last thyroid  functions Lab Results  Component Value Date   TSH 2.02 06/01/2023   T4TOTAL 7.1 06/01/2023   Last vitamin D  Lab Results  Component Value Date   VD25OH 46.54  06/01/2023   Last vitamin B12 and Folate Lab Results  Component Value Date   VITAMINB12 670 06/01/2023      The 10-year ASCVD risk score (Arnett DK, et al., 2019) is: 3.2%    Assessment & Plan:   Problem List Items Addressed This Visit       Unprioritized   B12 deficiency - Primary  Assessment and Plan    Ankle Sprain Experiences throbbing pain in the ankle since 4 AM after walking five miles the previous day. There is swelling on the side of the ankle and episodes of buckling, but no recollection of twisting it. Pain is bearable, and walking is possible, though initially gingerly. There is a history of a severe leg  fracture in the same leg eight years ago. Ankle sprain is suspected, with a stress fracture as a differential diagnosis. Given the improvement in swelling and ability to bear weight, an x-ray is not necessary at this time. Discussed risks of untreated potential fracture, including prolonged pain and worsening injury. If the condition does not improve or worsens, further evaluation, including an x-ray or referral to sports medicine, will be considered. Use an air splint or ACE wrap, elevate the ankle, apply ice, and take ibuprofen  or acetaminophen  for pain. Consider an x-ray if the condition worsens or does not improve. Refer to sports medicine if no improvement.  Vitamin B12 Deficiency Inquired about receiving a B12 injection and it is unclear if it is due today. Agreed to administer the B12 shot if it is due.        No follow-ups on file.    Tamitha Norell R Lowne Chase, DO

## 2023-08-11 ENCOUNTER — Telehealth: Payer: Self-pay | Admitting: Psychiatry

## 2023-08-11 NOTE — Telephone Encounter (Signed)
 [facility] called office regarding referral sent over. They aren't accepting outside referrals for neurology testing at this time.

## 2023-08-11 NOTE — Telephone Encounter (Signed)
 Referral sent to Dr. Rowan Cooter at [facility]

## 2023-08-13 ENCOUNTER — Telehealth: Payer: Self-pay | Admitting: Psychiatry

## 2023-08-13 NOTE — Telephone Encounter (Signed)
 LVM that I would call again on Monday.

## 2023-08-13 NOTE — Telephone Encounter (Signed)
 Spoke with the patient via telephone. She states that since going up on Cymbalta  to 50mg  daily she has felt out of it. Discussed the options of staying at this dose or going back to 40mg  for the weekend until she speaks with Dr Geoffry. She would prefer to take 40mg  for the weekend. No other concerns at this time. No SI/HI/AVH.

## 2023-08-13 NOTE — Telephone Encounter (Signed)
 PT lvm that dr. Toi Foster increased her cymbalta . She said that she feels out of it and off. Please call her at [phone]

## 2023-08-16 ENCOUNTER — Other Ambulatory Visit: Payer: Self-pay | Admitting: Family Medicine

## 2023-08-16 DIAGNOSIS — I10 Essential (primary) hypertension: Secondary | ICD-10-CM

## 2023-08-16 MED ORDER — ATENOLOL 25 MG PO TABS: 25.0000 mg | ORAL_TABLET | Freq: Two times a day (BID) | ORAL | 0 refills | Status: DC

## 2023-08-16 NOTE — Telephone Encounter (Signed)
 Last Fill: 12/02/21  Last OV: 08/10/23 Next OV: None Scheduled  Routing to provider for review/authorization.

## 2023-08-16 NOTE — Telephone Encounter (Signed)
 Ok, but she wasn't on 50 mg very long.  She might get used to it.  But her choice.

## 2023-08-16 NOTE — Telephone Encounter (Unsigned)
 Copied from CRM [id number]. Topic: Clinical - Medication Refill >> Aug 16, 2023 10:01 AM Howard Macho wrote: Most Recent Primary Care Visit:  Provider: Roel Clarity R  Department: [facility]  Visit Type: ACUTE  Date: 08/10/2023  Medication: atenolol    Has the patient contacted their pharmacy? Yes (Agent: If no, request that the patient contact the pharmacy for the refill. If patient does not wish to contact the pharmacy document the reason why and proceed with request.) (Agent: If yes, when and what did the pharmacy advise?)  Is this the correct pharmacy for this prescription? Yes If no, delete pharmacy and type the correct one.  This is the patient's preferred pharmacy:  [facility] [address] [place], [place] - [address] [place] [address] Phone: [phone] Fax: [phone]  Has the prescription been filled recently? No  Is the patient out of the medication? Yes  Has the patient been seen for an appointment in the last year OR does the patient have an upcoming appointment? Yes  Can we respond through MyChart? Yes  Agent: Please be advised that Rx refills may take up to 3 business days. We ask that you follow-up with your pharmacy.

## 2023-08-16 NOTE — Telephone Encounter (Signed)
 Pt reduced her Cymbalta  back to 40 mg and said she is feeling better. She wants to stay on 40 mg until she sees you for FU in May.

## 2023-08-16 NOTE — Telephone Encounter (Signed)
 Copied from CRM [id number]. Topic: Clinical - Medication Refill >> Aug 16, 2023 10:01 AM Howard Macho wrote: Most Recent Primary Care Visit:  Provider: Roel Clarity R  Department: [facility]  Visit Type: ACUTE  Date: 08/10/2023  Medication: atenolol    Has the patient contacted their pharmacy? Yes (Agent: If no, request that the patient contact the pharmacy for the refill. If patient does not wish to contact the pharmacy document the reason why and proceed with request.) (Agent: If yes, when and what did the pharmacy advise?)  Is this the correct pharmacy for this prescription? Yes If no, delete pharmacy and type the correct one.  This is the patient's preferred pharmacy:  [facility] [address] [place], [place] - [address] [place] [address] Phone: [phone] Fax: [phone]  Has the prescription been filled recently? No  Is the patient out of the medication? Yes  Has the patient been seen for an appointment in the last year OR does the patient have an upcoming appointment? Yes  Can we respond through MyChart? Yes  Agent: Please be advised that Rx refills may take up to 3 business days. We ask that you follow-up with your pharmacy.

## 2023-08-16 NOTE — Telephone Encounter (Signed)
 Addressed in another note

## 2023-08-17 NOTE — Telephone Encounter (Signed)
Patient was notified. She said she is starting a new job soon and didn't have time to give the medication longer to take effect.

## 2023-08-31 ENCOUNTER — Telehealth: Payer: Self-pay | Admitting: Neurology

## 2023-08-31 NOTE — Telephone Encounter (Signed)
 Copied from CRM [id number]. Topic: General - Other >> Aug 31, 2023  2:18 PM Alcus Dad H wrote: Reason for CRM: Patient wants to let Dr. Laury Axon know the b12 shot isn't doing anything and she still doesn't have any energy

## 2023-09-01 ENCOUNTER — Telehealth: Payer: Self-pay | Admitting: Neurology

## 2023-09-01 ENCOUNTER — Ambulatory Visit: Payer: Medicaid Other | Admitting: Physician Assistant

## 2023-09-01 NOTE — Telephone Encounter (Signed)
 Pt has visit with Lillia Abed today

## 2023-09-01 NOTE — Telephone Encounter (Signed)
 Has appt today   Copied from CRM [id number]. Topic: General - Other >> Sep 01, 2023  1:09 PM Rodman Pickle T wrote: Reason for CRM: patient is extremely tired and eye has dark eyes and patient just had a b12 she wants to know if the over the counter flu test are accurate she k

## 2023-09-01 NOTE — Telephone Encounter (Signed)
 Unsure what this means. I am seeing her at 4 pm

## 2023-09-08 ENCOUNTER — Other Ambulatory Visit: Payer: Self-pay | Admitting: Family Medicine

## 2023-09-08 ENCOUNTER — Other Ambulatory Visit: Payer: Self-pay | Admitting: Psychiatry

## 2023-09-08 DIAGNOSIS — I10 Essential (primary) hypertension: Secondary | ICD-10-CM

## 2023-09-08 DIAGNOSIS — F411 Generalized anxiety disorder: Secondary | ICD-10-CM

## 2023-09-08 DIAGNOSIS — F4001 Agoraphobia with panic disorder: Secondary | ICD-10-CM

## 2023-09-12 ENCOUNTER — Other Ambulatory Visit: Payer: Self-pay | Admitting: Family Medicine

## 2023-09-13 ENCOUNTER — Telehealth: Payer: Self-pay | Admitting: Neurology

## 2023-09-13 NOTE — Telephone Encounter (Signed)
 Copied from CRM [id number]. Topic: General - Other >> Sep 13, 2023  3:20 PM [place] A wrote: Reason for CRM: Patient wants a referral for Endocrinologist-please contact

## 2023-09-14 ENCOUNTER — Other Ambulatory Visit: Payer: Self-pay | Admitting: Family Medicine

## 2023-09-14 ENCOUNTER — Telehealth: Payer: Self-pay | Admitting: Neurology

## 2023-09-14 DIAGNOSIS — E039 Hypothyroidism, unspecified: Secondary | ICD-10-CM

## 2023-09-14 NOTE — Telephone Encounter (Signed)
 Copied from CRM [id number]. Topic: General - Other >> Sep 13, 2023  3:20 PM [place] A wrote: Reason for CRM: Patient wants a referral for Endocrinologist-please contact >> Sep 14, 2023  8:42 AM Fonda Kinder J wrote: Pt called in to see if the referral had been placed. I advised her that her request had been sent to her pcp and she will receive a call once a decision had been made

## 2023-09-14 NOTE — Telephone Encounter (Signed)
 Copied from CRM [id number]. Topic: General - Other >> Sep 13, 2023  3:20 PM [place] A wrote: Reason for CRM: Patient wants a referral for Endocrinologist-please contact >> Sep 14, 2023 11:38 AM [place] E wrote: Patient called back to see if the Endocrinologist referral has been placed. Please call patient back when the referral is in. >> Sep 14, 2023  8:42 AM Fonda Kinder J wrote: Pt called in to see if the referral had been placed. I advised her that her request had been sent to her pcp and she will receive a call once a decision had been made

## 2023-09-16 ENCOUNTER — Telehealth: Payer: Self-pay

## 2023-09-16 NOTE — Telephone Encounter (Signed)
 Copied from CRM [id number]. Topic: General - Other >> Sep 16, 2023  9:01 AM Almira Coaster wrote: Reason for CRM: Patient is calling because she was referred to neuropsychology due to a learning disability:however, the provider she was referred to is far and she would like to know if Seabron Spates had any recommendations for a provider within [place]. >> Sep 16, 2023  9:08 AM Almira Coaster wrote: Patient would also like to know If our office is able to assist with scheduling the ENDOCRINOLOGY  department.

## 2023-09-21 ENCOUNTER — Telehealth: Payer: Self-pay | Admitting: Family Medicine

## 2023-09-21 NOTE — Telephone Encounter (Signed)
 Pt is requesting referral be switched to [facility] please

## 2023-09-21 NOTE — Telephone Encounter (Signed)
 Copied from CRM [id number]. Topic: Referral - Status >> Sep 20, 2023  4:52 PM [name] wrote: Reason for CRM: Pt called to follow up on referral for endocrinologist.Please call pt to update her on the referral request. Pt is requesting the provider to contact the endocrinologist to schedule an appt  please call pt back at  [phone]

## 2023-09-21 NOTE — Telephone Encounter (Signed)
 Copied from CRM [id number]. Topic: Referral - Status >> Sep 20, 2023  4:52 PM [name] wrote: Reason for CRM: Pt called to follow up on referral for endocrinologist.Please call pt to update her on the referral request. Pt is requesting the provider to contact the endocrinologist to schedule an appt  please call pt back at  [phone] >> Sep 21, 2023 11:27 AM [name] wrote: Patient called back stating she called the Endocrinology department and they stated how one person is handling 500+ referrals, patient questioning if her referral is able to get moved up and she wanted to make PCP aware of this.

## 2023-09-29 DIAGNOSIS — E063 Autoimmune thyroiditis: Secondary | ICD-10-CM | POA: Diagnosis not present

## 2023-09-29 DIAGNOSIS — R5383 Other fatigue: Secondary | ICD-10-CM | POA: Diagnosis not present

## 2023-09-29 DIAGNOSIS — R0683 Snoring: Secondary | ICD-10-CM | POA: Diagnosis not present

## 2023-09-30 ENCOUNTER — Telehealth: Payer: Self-pay | Admitting: Psychiatry

## 2023-09-30 NOTE — Telephone Encounter (Signed)
 Referral has been faxed to [facility] in [place].  They stated that they are scheduling into November.

## 2023-09-30 NOTE — Telephone Encounter (Signed)
 Noted, thank you

## 2023-10-07 ENCOUNTER — Telehealth: Payer: Self-pay | Admitting: Psychiatry

## 2023-10-07 NOTE — Telephone Encounter (Addendum)
 Pt said she would discuss need for referral at next appt, but called back to say what she needed. The referral was for an auditory processing disorder and it was denied. She thought it was for an audiology referral. Told her that the processing disorder was not the same as a hearing issue. She said she needs to know where in her brain her learning disorder(s) are. Has FU 5/5.

## 2023-10-07 NOTE — Telephone Encounter (Signed)
 Pt called back and talk with [name].  Jenna Arroyo will just talk with Dr. Jennelle Human about this at her next appt.  We did also receive notice that the referral was denied.  I will forward that to Dr. Jennelle Human.  There is not a need to call Marquasia back because she will need to discuss with Dr. Jennelle Human the diagnosis for the referral.

## 2023-10-07 NOTE — Telephone Encounter (Signed)
 See  telephone encounter on 09/30/23. Patient called today stating the referral submitted stated it was an Audiology Referral. She stated it was supposed to say for a Learning Disability Referral. She is requesting a call back as soon as the nurse is available, so she doesn't miss her place in line at [facility].

## 2023-10-19 ENCOUNTER — Telehealth: Payer: Self-pay | Admitting: Psychiatry

## 2023-10-19 ENCOUNTER — Other Ambulatory Visit: Payer: Self-pay

## 2023-10-19 DIAGNOSIS — F900 Attention-deficit hyperactivity disorder, predominantly inattentive type: Secondary | ICD-10-CM

## 2023-10-19 MED ORDER — MODAFINIL 100 MG PO TABS
50.0000 mg | ORAL_TABLET | Freq: Every day | ORAL | 0 refills | Status: DC
Start: 2023-10-19 — End: 2024-01-12

## 2023-10-19 NOTE — Telephone Encounter (Signed)
 Pended modafinil to [facility].

## 2023-10-19 NOTE — Telephone Encounter (Signed)
 Pt called asking for a refill on her provigil 100 mg. Pharmacy is [facility] [address] in [place]

## 2023-10-19 NOTE — Telephone Encounter (Signed)
 Pt called at 10:52a.  She said the referral was to the audiologist, but Dr Toi Foster was supposed to do the referral for the Neuropsychologist.

## 2023-10-19 NOTE — Telephone Encounter (Signed)
 She would like a call back.  She said she will be at work but you can leave a message on her voice mail.

## 2023-10-20 NOTE — Telephone Encounter (Signed)
 I think Trenia Fritter is working on this.

## 2023-10-21 NOTE — Telephone Encounter (Signed)
 Please see message and advise. I know you had sent a referral but she said the reason for the referral was not documented correctly and it was denied. Needs to be sent with the correct reason. She said she wants to know the location in her brain for her learning disabilities.

## 2023-10-24 ENCOUNTER — Telehealth: Payer: Self-pay

## 2023-10-24 NOTE — Telephone Encounter (Signed)
 Prior authorization submitted with [facility] for Modafinil  100 mg, response is denial due to pt's diagnosis of ADHD, approved only for narcolepsy, excessive sleepiness with shift work disorder, obstructive sleep apnea, or excessive fatigue associated with MS.

## 2023-10-25 NOTE — Telephone Encounter (Signed)
 Make sure she knows about [facility].  [facility] for $27 for #30 of 200 mg .  If she splits it in half it will last 2 mos.

## 2023-10-25 NOTE — Telephone Encounter (Signed)
 LVM per DPR with the info that PA for modafinil  had been denied.

## 2023-10-26 NOTE — Telephone Encounter (Signed)
 LVM per DPR with info on using [facility] for modafinil .

## 2023-11-01 ENCOUNTER — Telehealth: Payer: Self-pay | Admitting: Family Medicine

## 2023-11-01 NOTE — Telephone Encounter (Unsigned)
 Copied from CRM [id number]. Topic: Referral - Question >> Nov 01, 2023 11:28 AM Danae Duncans wrote: Reason for CRM: pt would like a referral as she is experiencing muscle tightness/back pain ( Did decline nurse triage) pt state she will be at work if she missed call.

## 2023-11-05 ENCOUNTER — Telehealth: Payer: Self-pay

## 2023-11-05 NOTE — Telephone Encounter (Signed)
 Routing to PCP: Per previous telephone encounter on 11/01/23, pt requested a referral as she is experiencing muscle tightness/back pain ( Did decline nurse triage) pt state she will be at work if she missed call.

## 2023-11-05 NOTE — Telephone Encounter (Signed)
 Copied from CRM [id number]. Topic: Referral - Status >> Nov 05, 2023  9:29 AM Jenna Arroyo wrote: Reason for CRM: Patient is calling because she would like to know if there is any progress on her referral to see someone for her back

## 2023-11-05 NOTE — Telephone Encounter (Signed)
 FYI previous message from 11/01/23 was not routed to us . Shelvy Dickens has been made aware. Please advise for referral

## 2023-11-08 ENCOUNTER — Ambulatory Visit (INDEPENDENT_AMBULATORY_CARE_PROVIDER_SITE_OTHER): Payer: Medicaid Other | Admitting: Psychiatry

## 2023-11-08 ENCOUNTER — Encounter: Payer: Self-pay | Admitting: Psychiatry

## 2023-11-08 DIAGNOSIS — F411 Generalized anxiety disorder: Secondary | ICD-10-CM

## 2023-11-08 DIAGNOSIS — Z8659 Personal history of other mental and behavioral disorders: Secondary | ICD-10-CM | POA: Diagnosis not present

## 2023-11-08 DIAGNOSIS — F4001 Agoraphobia with panic disorder: Secondary | ICD-10-CM

## 2023-11-08 DIAGNOSIS — F900 Attention-deficit hyperactivity disorder, predominantly inattentive type: Secondary | ICD-10-CM

## 2023-11-08 MED ORDER — DULOXETINE HCL 20 MG PO CPEP
40.0000 mg | ORAL_CAPSULE | Freq: Every day | ORAL | 5 refills | Status: DC
Start: 2023-11-08 — End: 2024-04-19

## 2023-11-08 MED ORDER — ALPRAZOLAM 0.5 MG PO TABS
ORAL_TABLET | ORAL | 5 refills | Status: DC
Start: 2023-11-08 — End: 2024-04-19

## 2023-11-08 NOTE — Progress Notes (Signed)
 Jenna Arroyo [id number] 22-Jan-1966 58 y.o.    Subjective:   Patient ID:  Jenna Arroyo is a 58 y.o. (DOB [birth year]) female.  Chief Complaint:  Chief Complaint  Patient presents with   Follow-up   Anxiety   ADD   Stress    Jenna Arroyo presents to the office today for follow-up of anxiety and mood.  seen October 2020.  She had been having cognitive complaints as previously noted. Still recommend trial switch to Ativan  1 mg QID to see if cognition is better.  Her cognitive problems have frequently been in a contributor to her difficulty maintaining employment.  She is fearful about having Xanax  withdrawal.  This was discussed in detail.  Also discussed in detail the potential sedative effects of benzodiazepines and especially in the first week of the transition from 1 of the medications to another.  It is likely that she will be more successful with the transition if she is a little more sleepy than normal the first week by using half of each medication then if we abruptly switch medications.  That has a much lower success rate in her case because of her worry about the transition. She agrees to a trial.  Will do 1/2 of 0.5 mg tablet tablet of Xanax  and 1/2 of 1 mg tablet Ativan  QID for 1 week. Then DC alprazolam  and take Ativan  1 mg 3 times daily or 4 times daily to control anxiety.  Call back in 2 to 3 weeks to give a report of how well she is doing with the new medication. DC hydroxyzine  because of its potential to cause cognitive problems. Continue duloxetine  60 mg daily.  10/18/19 appt with the following noted and no further med changes: She had difficulty implementing the transition and then complained of insomnia with lorazepam  and was switched back to alprazolam  after a week.  Also CO pain and nausea.   Overall doing OK except son and D upset with her since December.  Disc this in detail.  Hasn't seen GD's since then.  Working at job she loves but very PT. Exercising and lost  18#  02/21/20 with the following noted: Hanging in there with stress.  Not allowed to see GD still.  Doesn't know why. No Covid.  OCC BP spikes. No major med changes. Satisfied with duloxetine  and Xanax .  No full panic.   Sleep waxes and wanes but lately good.  Working same job as in April and likes it. Plan: no med changes  08/21/2020 appointment with the following noted: Stopped duloxetine  for a week early Feb over BP concerns from it.  Convinced BP affected by 20 mg duloxetine  which she's been taking for a few months.  Felt 30 mg was too high and then it started causing problems.  When off the duloxetine  for a week then BP went down. Times when doesn't have it and the most noticeable thing is feeling tired and a little disconnected.  Longest off it was 2-4 weeks in last 6 mos. Has unresponsive PCP. Chronic tiredness.  Still dealing with chronic Lyme dz.  Plan: Stop duloxetine  BC of BP concerns.  Monitor and record BP and share with PCP.  Stay off for a full month and if consistently more anxious then can add pure SSRI low dose Lexapro  5 mg for anxiety.  11/18/20 TC Pt had asked to increase Lexapro  to 10 mg while mo in the hospital.  11/25/20 TC:  Pt contacted doctor on call this weekend concerning  her issues with Lexapro  10 mg. Since the increase to 10 mg she has been unable to sleep, reports black circles under her eyes, gets to sleep about 1-2 am. She also reports on Saturday she had a anxiety attack, she felt it was medication related not just her anxiety. Jenna Mozingo, NP advised her to stop Lexapro  and nurse would contact her Monday.  She asks about trying just the Lexapro  5 mg, she reports it helped and it didn't effect her sleep like 10 mg.  Pt stated the lexapro  made her have increased anxiety and she thinks its best if she goes back to a very low dose of Cymbalta .She stopped Cymbalta  due to it spiking her blood pressure.She says her bp was fine at the 20 mg dose. MD: ok to stop  Lexapro  and return to duloxetine  20  12/11/20 appt noted: Under more stress and more anxious.  Stress about 3 mos.  Dealing with condo where she lives.  Not making enough money to get basic things she needs and asking for money from parents.  Wants father to fix things in the condo but he doesn't do anything.   Place had a leak in the past.    Taking duloxetine  20 and off Lexapro  and on alprazolam  0.5 mg QID.   Worries over the condition in the world and thinks the world could end and she might not be able to get the Xanax  and fears withdrawal.    She feels bible is predicting end of the world soon.  But no thoughts of death were noted Plan: no med changes per her request.  02/11/2021 appointment with the following noted: Family sold condo she was in and so she moved back in with parents about 3 weeks ago.  Stressed.  Has been seeking disability for a couple of years.  Doesn't think she can work FT.  Other relationships are stressed.   Chronic difficulty with organization. Walking and losing weight but worries about BP so doesn't want to increase the duloxetine . Patient reports stable mood and denies depressed or irritable moods.  Patient has chronic difficulty with anxiety and stress. Denies appetite disturbance.  Patient reports that energy and motivation have been good.  Patient denies any difficulty with concentration.  Patient denies any suicidal ideation. No panic. Sleep is OK. Using Xanax  mostly TID.  On duloxetine  20 mg daily and doesn't want to change it.. Sleep better in the day.    No questions concerns re: meds. Plan no med changes per her request  06/19/2021 appointment with the following noted: Working Therapist, nutritional retirement.  Job offers [employer] FT Conflict with sister over dependence on her parents.  Feels her sister is unreasonable.  Can't handle being under that control.  Was doing good until she started in.  Blocked her contact.  Sister blames her for parents not  having enough money. Siblings forcing her to get a job. Satisfied with the meds. Upset at having to pay all her bills on her own now. Recognizes chronic difficulties managing herself well.  12/29/21 appt noted: Doing OK.  Family turned son against her.  It's tough.  Managing anxiety on current meds.  Don't want tto change the meds. Still grieving mother and dealing with family drama. On duloxetine  20 mg daily and Xanax  0.5 mg TID. Doesn't want to increase duloxetine  20 mg daily bc it increases BP.  04/24/2022 phone call complaining of depression and anxiety with low motivation and difficulty functioning at work.  Wants  to increase duloxetine . MD response to increase duloxetine  to 40 mg daily 04/27/2022 phone call patient indicated she wanted to go up in the dose slowly and wanted a prescription for duloxetine  30 mg daily.  05/27/2022 appointment noted: Current psych relevant meds include vitamin D  50,000 units weekly, hydroxyzine  25 mg every 8 hours as needed anxiety,  duloxetine  30 mg capsules 1 daily ( but hasn't started yet.  Plans to tomoroow) , Xanax  0.5 mg tablets 1 5 times a day as needed anxiety.  Patient states she is taking about 3 daily. Pretty rough still trying to find another job. M died 09-18-21 and she was in such a fog. Traumatized  by mother's death.  Shouldn't have happened. She believes F was narcissist.  He's tuned my son against me.  Conflict with all of the family.  Not ready to talk to them about it.  Estranged from family in part over eviction from family's property.   Living with mother's good friend. Plans to increase duloxetine  20 to 30 mg daily. Some arthritis issues.  08/06/22 TC about particular technique for tapering Xanax .  She was advised this is complicated and should be discussed at an appointment.  10/01/22 appt noted: Current Alprazolam  0.5 mg TID, duloxetine  30 mg daily.   No SE. Thinks duloxetine  15 would be the perfect dose.  60 is too much.  Feels  better with 30 mg daily. Back to walking and BP has gone down. Biggest stress job interviews and trying to find decent paying job.   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 Last employed before [employer], lasted 3 mos.  Didn't feel adequate training.   Disc her phone call about tapering Xanax .  Fears shortages of the med in the future giving what's going on in world.  Plan: No med changes today.  03/24/23 appt noted:  Taking duloxetine  30 mg daily, alprazolam  0.5 mg TID, hydroxyzine  prn but makes her drowsy. Thinks she needs to increase duloxetine . For depression.  At times feels disassociating from reality without psychosis.  Uses hydroxyzine  when feels pressured by others like "assertive" roommate.  Roommate can trigger disassociation.May need low rent housing.  Can't live off $15K / year.   Lost last job and needing to start new job with pressure to provide for herself.  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 Visiting church [place] and looking for a place to stay.   Real financial stress.   Still trouble learning and retaining information.  Plan: Increase for dep to Cymbalta  40 mg daily of duloxetine  for chronic anxiety.    08/10/23 appt noted: Taking duloxetine  40 mg daily, alprazolam  0.5 mg TID, hydroxyzine  prn but makes her drowsy. Modafinil  100 mg tab, 1/2 Am prn. Stress women living with is narcissist and stressful.  I might have to stay at [facility].  No car yet.  Would live out of car.  Hurts most that son doesn't care.   Couldn't keep last job bc "they didn't train".  Has interview for [employer]. She thinks incr duloxetine  with minimal effect but didn't affect BP.   Lately dep and anxiety with stressors.  With landlord. Interest in increasing duloxetine  to 50 mg daily. Plan: Option Retry modafinil  bc took before at 50 mg AM Increase duloxetine  to 50 mg daily  11/08/23 appt noted: Med: duloxetine  40 mg daily, alprazolam  0.5 mg TID-QID,  never got modafinil  100 mg tab, 1/2 Am prn. SE at 50 mg weird feeling  and feeling blunted.  Went away when went down.   Living with 2 women who are negative.  One had rage at restaurant and won't go with her places anymore.   Work at [employer]. Estranged from F, sister, son.   Wanted auditory processing testing and now says wants it for LD.     Past Psychiatric Medication Trials:  Duloxetine  60, imipramine, desipramine, sertraline hyper, citalopram side effects, paroxetine chest pain,  Lexapro  ? Less effective for anxiety Nuvigil edgy, provigil    buspirone dizzy,  Xanax , clonazepam Ambien anxiety, trazodone side effects,  atenolol   Review of Systems:  Review of Systems  Constitutional:  Positive for fatigue.       Low exercise tolerance  Cardiovascular:  Negative for chest pain and palpitations.  Gastrointestinal:  Negative for vomiting.  Musculoskeletal:  Positive for arthralgias and neck pain.  Neurological:  Positive for headaches. Negative for tremors.       Foggy headed.  Psychiatric/Behavioral:  Positive for sleep disturbance. Negative for agitation, behavioral problems, confusion, decreased concentration, dysphoric mood, hallucinations, self-injury and suicidal ideas. The patient is nervous/anxious. The patient is not hyperactive.     Medications: I have reviewed the patient's current medications.  Current Outpatient Medications  Medication Sig Dispense Refill   albuterol  (VENTOLIN  HFA) 108 (90 Base) MCG/ACT inhaler Inhale 2 puffs into the lungs every 6 (six) hours as needed for wheezing or shortness of breath. 18 g 5   atenolol  (TENORMIN ) 25 MG tablet Take 1 tablet (25 mg total) by mouth 2 (two) times daily. 180 tablet 0   Cholecalciferol (VITAMIN D ) 10 MCG/ML LIQD Take by mouth.     ciclopirox  (PENLAC ) 8 % solution Apply topically at bedtime. Apply over nail and surrounding skin. Apply daily over previous coat. After seven (7) days, may remove with alcohol and  continue cycle. 6.6 mL 0   EPINEPHrine  (EPIPEN  2-PAK) 0.3 mg/0.3 mL IJ SOAJ injection Inject 0.3 mg into the muscle as needed for anaphylaxis. 1 each 0   fluticasone  (FLONASE ) 50 MCG/ACT nasal spray Place 2 sprays into both nostrils daily. 16 g 6   hydrOXYzine  (ATARAX ) 10 MG tablet TAKE 1 TO 2 TABLETS BY MOUTH EVERY 8 HOURS AS NEEDED FOR ANXIETY 60 tablet 2   irbesartan -hydrochlorothiazide  (AVALIDE) 150-12.5 MG tablet Take 2 tablets by mouth once daily 180 tablet 1   modafinil  (PROVIGIL ) 100 MG tablet Take 0.5 tablets (50 mg total) by mouth daily. 15 tablet 0   NP THYROID  15 MG tablet Take 3 tablets by mouth once daily 270 tablet 0   nystatin  cream (MYCOSTATIN ) Apply 1 Application topically 2 (two) times daily. 30 g 1   ofloxacin  (OCUFLOX ) 0.3 % ophthalmic solution Place 1 drop into the right eye every hour while awake.     silver  sulfADIAZINE  (SILVADENE ) 1 % cream Apply 1 application topically daily. 50 g 0   Vitamin D , Ergocalciferol , (DRISDOL ) 1.25 MG (50000 UNIT) CAPS capsule Take 1 capsule (50,000 Units total) by mouth once a week. 12 capsule 1   ALPRAZolam  (XANAX ) 0.5 MG tablet TAKE ONE TABLET EVERY MORNING, TAKE ONE TABLET DAILY AT NOON, TAKE ONE TABLET EVERY EVENING AND TAKE ONE TABLET DAILY AT BEDTIME 120 tablet 5   DULoxetine  (CYMBALTA ) 20 MG capsule Take 2 capsules (40 mg total) by mouth daily. 60 capsule 5   No current facility-administered medications for this visit.    Medication Side Effects: None  Allergies:  Allergies  Allergen Reactions   Ceftriaxone Sodium In Dextrose Other (  See Comments)    "heart races and cannot breathe"   Guaifenesin & Derivatives Other (See Comments)    "heart races, dizziness, light-headedness"   Meperidine Other (See Comments)    "heart races"   Other Rash   Penicillins Hives   Pseudoephedrine Other (See Comments)    "heart races and high blood pressure"   Rosuvastatin Calcium Nausea And Vomiting    Muscle aches    Simvastatin Other (See  Comments)    muscle aching    Ciprofloxacin    Rocephin [Ceftriaxone] Itching    Past Medical History:  Diagnosis Date   Anxiety    High cholesterol    Hypertension    Lyme disease    Thyroid  disease     Family History  Problem Relation Age of Onset   Heart failure Mother    Hypertension Mother    High Cholesterol Mother    Heart disease Father    High Cholesterol Father    Dementia Father    Hypertension Sister    High Cholesterol Sister    Hypertension Brother    High Cholesterol Brother    Colon cancer Neg Hx     Social History   Socioeconomic History   Marital status: Divorced    Spouse name: Not on file   Number of children: Not on file   Years of education: Not on file   Highest education level: Not on file  Occupational History   Not on file  Tobacco Use   Smoking status: Former    Current packs/day: 0.00    Types: Cigarettes    Quit date: 2006    Years since quitting: 19.3   Smokeless tobacco: Never   Tobacco comments:    socially   Advertising account planner   Vaping status: Never Used  Substance and Sexual Activity   Alcohol use: No   Drug use: No   Sexual activity: Not Currently    Birth control/protection: Post-menopausal  Other Topics Concern   Not on file  Social History Narrative   Not on file   Social Drivers of Health   Financial Resource Strain: Not on file  Food Insecurity: No Food Insecurity (11/25/2020)   Received from [facility]   Hunger Vital Sign    Worried About Running Out of Food in the Last Year: Never true    Ran Out of Food in the Last Year: Never true  Transportation Needs: No Transportation Needs (06/18/2020)   PRAPARE - Administrator, Civil Service (Medical): No    Lack of Transportation (Non-Medical): No  Physical Activity: Not on file  Stress: Not on file  Social Connections: Unknown (11/15/2021)   Received from [facility], [facility]   Social Network    Social Network: Not on file   Intimate Partner Violence: Unknown (10/07/2021)   Received from [facility], [facility]   HITS    Physically Hurt: Not on file    Insult or Talk Down To: Not on file    Threaten Physical Harm: Not on file    Scream or Curse: Not on file    Past Medical History, Surgical history, Social history, and Family history were reviewed and updated as appropriate.   Please see review of systems for further details on the patient's review from today.   Objective:   Physical Exam:  There were no vitals taken for this visit.  Physical Exam Constitutional:      General: She is not in  acute distress. Musculoskeletal:        General: No deformity.  Neurological:     Mental Status: She is alert and oriented to person, place, and time.     Cranial Nerves: No dysarthria.     Coordination: Coordination normal.  Psychiatric:        Attention and Perception: Perception normal. She is inattentive. She does not perceive auditory or visual hallucinations.        Mood and Affect: Mood is anxious. Mood is not depressed. Affect is not labile, angry, tearful or inappropriate.        Speech: Speech normal. Speech is not slurred.        Behavior: Behavior normal. Behavior is cooperative.        Thought Content: Thought content normal. Thought content is not paranoid or delusional. Thought content does not include homicidal or suicidal ideation. Thought content does not include suicidal plan.        Cognition and Memory: Cognition and memory normal.        Judgment: Judgment is inappropriate.     Comments: Insight fair.   chronically low stress tolerance.  Chronic stress.. Complain of chronic forgetfulness Talkative and need      Lab Review:     Component Value Date/Time   NA 136 06/01/2023 1421   K 4.0 06/01/2023 1421   CL 95 (L) 06/01/2023 1421   CO2 32 06/01/2023 1421   GLUCOSE 73 06/01/2023 1421   BUN 19 06/01/2023 1421   CREATININE 1.03 06/01/2023 1421   CREATININE 0.85 04/05/2020 1052    CALCIUM 9.8 06/01/2023 1421   PROT 7.1 06/01/2023 1421   ALBUMIN 4.5 06/01/2023 1421   AST 17 06/01/2023 1421   ALT 23 06/01/2023 1421   ALKPHOS 103 06/01/2023 1421   BILITOT 0.8 06/01/2023 1421   GFRNONAA >60 02/01/2020 1513   GFRAA >60 02/01/2020 1513       Component Value Date/Time   WBC 8.5 06/01/2023 1421   RBC 4.85 06/01/2023 1421   HGB 15.2 (H) 06/01/2023 1421   HCT 44.3 06/01/2023 1421   PLT 381.0 06/01/2023 1421   MCV 91.4 06/01/2023 1421   MCH 30.3 04/05/2020 1052   MCHC 34.2 06/01/2023 1421   RDW 13.7 06/01/2023 1421   LYMPHSABS 1.7 06/01/2023 1421   MONOABS 0.5 06/01/2023 1421   EOSABS 0.2 06/01/2023 1421   BASOSABS 0.1 06/01/2023 1421    No results found for: "POCLITH", "LITHIUM"   No results found for: "PHENYTOIN", "PHENOBARB", "VALPROATE", "CBMZ"   .res Assessment: Plan:   GAD (generalized anxiety disorder) - Plan: ALPRAZolam  (XANAX ) 0.5 MG tablet, DULoxetine  (CYMBALTA ) 20 MG capsule  Panic disorder with agoraphobia - Plan: DULoxetine  (CYMBALTA ) 20 MG capsule  Attention deficit hyperactivity disorder (ADHD), predominantly inattentive type  History of panic attacks - Plan: ALPRAZolam  (XANAX ) 0.5 MG tablet  Chronic family conflict ongoing.   Patient has been under our care for many years.  She has chronic  instability , anxiety and dysfunction function but does generally get benefit from the medication.  She has not been able to reduce the dosage of alprazolam  which she has taken for many years due to excessive anxiety.  Chronic problems and conflicts with parents and sister and brother and other people  continue Cymbalta  40 mg daily of duloxetine  for chronic anxiety.  Couldn't tolerate higher doses.    She failed attempted transition from alprazolam  to lorazepam  in October 2020.  It appears she had some withdrawal symptoms and she is  gone back to alprazolam .  It was discussed with her that we could do the transition more gradually if she wanted to  pursue that to see if she can get cognitive benefit with the switch.  She does not want to do that at this time.  Anxiety interferes with cognition too.  Option switch to lorazepam .  Continue Xanax  0.5 mg QID, she's been able to limit to 3 daily.  She can continue to try hydroxyzine  10-20  mg every 8 hours as needed anxiety.  However if it does not help then do not take it.  Discussed side effect risks.. she doesn't want more meds.  Previously discussed the short-term risks associated with benzodiazepines including sedation and increased fall risk among others.  Discussed long-term side effect risk including dependence, potential withdrawal symptoms, and the potential eventual dose-related risk of dementia.  But recent studies from 2020 dispute this association between benzodiazepines and dementia risk. Newer studies in 2020 do not support an association with dementia.   Has been to counseling at [facility] in recent past.  Counseling 20 min:  Supportive and problem solving techniques with family conflict with sister and son and more recently stress of living arrangement with woman who is aggressive verbally. Supportive therapy dealing with family conflict.  Chronic issues with sister and father.   (NAC) N-Acetylcysteine 2 of the  600 mg capsules daily to help with mild cognitive problems.  It can be combined with a B-complex vitamin as the B-12 and folate which can sometimes enhance the effect.  Disc ADD tx and LD issues.   Option Retry modafinil  off label  bc took before at 50 mg AM.  She never got it DT insurance wouldn't cover. Less likely to cause anxiety than regular stimulants.    She asked about neuropsych for poss auditory processing disorder, but couldn't arrange it.  Now says wanted it for LD  FU 6 mos  Nori Beat, MD, DFAPA   Please see After Visit Summary for patient specific instructions.  No future appointments.      No orders of the defined types were placed  in this encounter.     -------------------------------

## 2023-11-10 NOTE — Telephone Encounter (Signed)
 Pt called. Called three times. Phone would ring and go to dead air.

## 2023-12-20 ENCOUNTER — Telehealth: Payer: Self-pay | Admitting: Family Medicine

## 2023-12-20 NOTE — Telephone Encounter (Signed)
 Copied from CRM [id number]. Topic: Referral - Status >> Nov 05, 2023  9:29 AM [name] wrote: Reason for CRM: Patient is calling because she would like to know if there is any progress on her referral to see someone for her back >> Dec 20, 2023 12:27 PM [name] wrote: Patient is recalling in for an update  Would like to be contacted by number on file

## 2023-12-20 NOTE — Telephone Encounter (Signed)
 Chart reviewed, unsure which referral she is referring to, will leave for CMA.

## 2023-12-21 NOTE — Telephone Encounter (Signed)
 Please advise? I don't see an ortho or sports medicine referral?

## 2023-12-21 NOTE — Telephone Encounter (Signed)
 Copied from CRM [id number]. Topic: Referral - Status >> Nov 05, 2023  9:29 AM [name] wrote: Reason for CRM: Patient is calling because she would like to know if there is any progress on her referral to see someone for her back

## 2023-12-23 ENCOUNTER — Other Ambulatory Visit: Payer: Self-pay | Admitting: Family Medicine

## 2023-12-23 DIAGNOSIS — G8929 Other chronic pain: Secondary | ICD-10-CM

## 2023-12-28 ENCOUNTER — Other Ambulatory Visit: Payer: Self-pay | Admitting: Family Medicine

## 2023-12-28 ENCOUNTER — Telehealth: Payer: Self-pay

## 2023-12-28 MED ORDER — THYROID 15 MG PO TABS: 45.0000 mg | ORAL_TABLET | Freq: Every day | ORAL | 0 refills | Status: DC

## 2023-12-28 NOTE — Telephone Encounter (Signed)
 Refill sent.

## 2023-12-28 NOTE — Telephone Encounter (Signed)
 Copied from CRM [id number]. Topic: Clinical - Prescription Issue >> Dec 28, 2023  1:50 PM Berneda FALCON wrote: Reason for CRM: Pt states she called the pharmacy to get an emergency refill for thyroid  (NP THYROID ) 15 MG tablet as she misplaced it, believes at work and cannot find it. Has not had medication in 10 days and would like an emergency refill please.  [facility] [address] [place], [place] - [address] [place] [address] Phone: [phone] Fax: [phone] Hours: Not open 24 hours  Patient callback is [phone] Please call patient back when this is completed so she knows it is at the pharmacy.

## 2023-12-31 ENCOUNTER — Telehealth: Payer: Self-pay | Admitting: Psychiatry

## 2023-12-31 NOTE — Telephone Encounter (Signed)
 Pt called stating that she heard you can get a DUI if you are taking Xanax .  I told her driving under the influence doesn't just mean alcohol.  But she wants Dr Geoffry to tell her what she needs to do if she's taking Xanax  and gets pulled over.  No upcoming appt scheduled.

## 2023-12-31 NOTE — Telephone Encounter (Signed)
 Please see message from pt. I told her if she felt impaired while taking Xanax  she shouldn't drive. She said she was aware but someone mentioned to her about possibility of a DUI if under the influence of Xanax  and how to deal with it. I asked her if the issue had come up and she said no.

## 2024-01-03 ENCOUNTER — Telehealth: Payer: Self-pay | Admitting: Psychiatry

## 2024-01-03 NOTE — Telephone Encounter (Signed)
 Pt called asking for  a letter stating that she has been diagnosed with ADD and is on medicine for this. She needs this letter to take work so that she can have more time learning her job.Please call her when letter is ready for pick up. Her number is [phone]

## 2024-01-03 NOTE — Telephone Encounter (Signed)
 Please see message.

## 2024-01-05 NOTE — Telephone Encounter (Signed)
 Signed letter

## 2024-01-10 ENCOUNTER — Telehealth: Payer: Self-pay

## 2024-01-10 NOTE — Telephone Encounter (Signed)
 Copied from CRM [id number]. Topic: Referral - Status >> Jan 10, 2024 10:46 AM Franky GRADE wrote: Reason for CRM: Patient is calling because the referral that was placed is incorrect she is not looking for a sports medicine provider, she would like to have physical therapy.

## 2024-01-10 NOTE — Telephone Encounter (Signed)
 Okay to place new referral?

## 2024-01-11 ENCOUNTER — Other Ambulatory Visit: Payer: Self-pay

## 2024-01-11 DIAGNOSIS — G8929 Other chronic pain: Secondary | ICD-10-CM

## 2024-01-11 NOTE — Telephone Encounter (Signed)
 New referral placed.

## 2024-01-12 ENCOUNTER — Telehealth: Payer: Self-pay | Admitting: Psychiatry

## 2024-01-12 ENCOUNTER — Other Ambulatory Visit: Payer: Self-pay

## 2024-01-12 DIAGNOSIS — F900 Attention-deficit hyperactivity disorder, predominantly inattentive type: Secondary | ICD-10-CM

## 2024-01-12 NOTE — Telephone Encounter (Signed)
 Pt needs refill of Provigil  100mg  sent to  [facility] [address] [place]

## 2024-01-12 NOTE — Telephone Encounter (Signed)
 Pended.

## 2024-01-13 MED ORDER — MODAFINIL 100 MG PO TABS: 50.0000 mg | ORAL_TABLET | Freq: Every day | ORAL | 3 refills | Status: DC

## 2024-01-14 ENCOUNTER — Telehealth: Payer: Self-pay

## 2024-01-14 DIAGNOSIS — G8929 Other chronic pain: Secondary | ICD-10-CM

## 2024-01-14 NOTE — Telephone Encounter (Signed)
 Copied from CRM [id number]. Topic: Referral - Question >> Jan 14, 2024 12:14 PM Jenna Arroyo wrote: Reason for CRM: Pt is requesting a referral for dry needling with [facility] for her Back. Fax number is [phone] and pt would like a callback with an update on this request.

## 2024-01-20 NOTE — Addendum Note (Signed)
 Addended by: Mylo Driskill D on: 01/20/2024 08:33 AM   Modules accepted: Orders

## 2024-01-20 NOTE — Telephone Encounter (Signed)
 Referral placed.

## 2024-02-21 ENCOUNTER — Other Ambulatory Visit: Payer: Self-pay | Admitting: Family Medicine

## 2024-03-07 ENCOUNTER — Ambulatory Visit: Payer: Self-pay

## 2024-03-07 NOTE — Telephone Encounter (Signed)
 Appt scheduled

## 2024-03-07 NOTE — Telephone Encounter (Signed)
 FYI Only or Action Required?: FYI only for provider.  Patient was last seen in primary care on 08/10/2023 by Antonio Meth, [name], DO.  Called Nurse Triage reporting Cyst.  Symptoms began several months ago.  Interventions attempted: Rest, hydration, or home remedies.  Symptoms are: gradually worsening.  Triage Disposition: See PCP Within 2 Weeks (overriding See Physician Within 24 Hours)  Patient/caregiver understands and will follow disposition?: Yes                             Copied from CRM #[id number]. 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 Patient stated she had this when she was younger. Patient stated it is leaking slowly Reason for Disposition  [1] Swelling is painful to touch AND [2] no fever  Answer Assessment - Initial Assessment Questions 1. APPEARANCE of SWELLING: What does it look like?     Unsure, states she cannot see it  2. SIZE: How large is the swelling? (e.g., inches, cm; or compare to size of pinhead, tip of pen, eraser, coin, pea, grape, ping pong ball)      Unsure, states she cannot see it 3. LOCATION: Where is the swelling located?     Directly below tailbone 4. ONSET: When did the swelling start?     4 months ago, worsening 5. COLOR: What color is it? Is there more than one color?     Unsure, states she cannot see it 6. PAIN: Is there any pain? If Yes, ask: How bad is the pain? (Scale 1-10; or mild, moderate, severe)       States pain is mild at this time, rates pain 3-4 7. ITCH: Does it itch? If Yes, ask: How bad is the itch?      Denies 8. CAUSE: What do you think caused the swelling?     Pilonidal cyst 9 OTHER SYMPTOMS: Do you have any other symptoms? (e.g., fever)     Cyst is slowly draining- clear with pink tinge, denies fever    Patient stated she had surgery  when she was 15 to remove a pilonidal cyst. Offered patient appointment with PCP today and patient declined. Patient requested appointment with PCP next Tuesday, due to work schedule. This RN scheduled patient for next Tuesday with PCP and advised patient to call back if symptoms worsen. Patient verbalized understanding.  Protocols used: Skin Lump or Localized Swelling-A-AH

## 2024-03-14 ENCOUNTER — Ambulatory Visit: Admitting: Family Medicine

## 2024-03-28 ENCOUNTER — Telehealth: Payer: Self-pay | Admitting: Neurology

## 2024-03-28 ENCOUNTER — Other Ambulatory Visit: Payer: Self-pay

## 2024-03-28 ENCOUNTER — Other Ambulatory Visit: Payer: Self-pay | Admitting: Family Medicine

## 2024-03-28 DIAGNOSIS — M51369 Other intervertebral disc degeneration, lumbar region without mention of lumbar back pain or lower extremity pain: Secondary | ICD-10-CM

## 2024-03-28 DIAGNOSIS — M5134 Other intervertebral disc degeneration, thoracic region: Secondary | ICD-10-CM

## 2024-03-28 NOTE — Telephone Encounter (Signed)
 Please advise.   Copied from CRM #[id number]. Topic: Referral - Question >> Mar 28, 2024  8:19 AM Mia F wrote: Reason for CRM: Pt is asking if she can be referred to someone who performs dry needling. Pt would like to be contacted with providers. She states someone across the hall from office may perform it

## 2024-03-30 ENCOUNTER — Ambulatory Visit: Admitting: Family Medicine

## 2024-03-30 ENCOUNTER — Telehealth: Payer: Self-pay

## 2024-03-30 NOTE — Telephone Encounter (Signed)
 Received a PA request for her Modafinil  100 mg but she will continue to have to use GoodRx. Her diagnosis of ADHD is not covered with [facility].

## 2024-03-30 NOTE — Telephone Encounter (Signed)
Sent MyChart message with this info

## 2024-04-04 ENCOUNTER — Ambulatory Visit: Admitting: Family Medicine

## 2024-04-06 ENCOUNTER — Telehealth: Payer: Self-pay

## 2024-04-06 NOTE — Telephone Encounter (Signed)
 Pt is reporting modafinil  is making her tired. She is taking 50 mg and asking if she should reduce it further. I told her it should make her more alert. She is sleeping fine at night. She reports her living situation is stressful because she doesn't get along with roommate. Rates depression at 4.5/10. Reports some anxiety but did not rate.   Takes Alprazolam  0.5 TID, though is prescribed QID Duloxetine  40 mg every day Hydroxyzine  10 mg 1-2 tabs q8h prn prescribed by PCP. Reports has an appt with PCP tomorrow.  Last seen 5/5, has FU 11/17.

## 2024-04-07 ENCOUNTER — Encounter: Payer: Self-pay | Admitting: Family Medicine

## 2024-04-07 ENCOUNTER — Ambulatory Visit: Admitting: Family Medicine

## 2024-04-07 VITALS — BP 108/70 | HR 76 | Temp 98.3°F | Resp 18 | Ht 66.0 in | Wt 202.0 lb

## 2024-04-07 DIAGNOSIS — I10 Essential (primary) hypertension: Secondary | ICD-10-CM

## 2024-04-07 DIAGNOSIS — E559 Vitamin D deficiency, unspecified: Secondary | ICD-10-CM

## 2024-04-07 DIAGNOSIS — F419 Anxiety disorder, unspecified: Secondary | ICD-10-CM | POA: Diagnosis not present

## 2024-04-07 DIAGNOSIS — M51369 Other intervertebral disc degeneration, lumbar region without mention of lumbar back pain or lower extremity pain: Secondary | ICD-10-CM

## 2024-04-07 DIAGNOSIS — M5134 Other intervertebral disc degeneration, thoracic region: Secondary | ICD-10-CM

## 2024-04-07 DIAGNOSIS — E039 Hypothyroidism, unspecified: Secondary | ICD-10-CM | POA: Diagnosis not present

## 2024-04-07 DIAGNOSIS — Z1211 Encounter for screening for malignant neoplasm of colon: Secondary | ICD-10-CM

## 2024-04-07 MED ORDER — HYDROXYZINE HCL 10 MG PO TABS: ORAL_TABLET | ORAL | 2 refills | Status: DC

## 2024-04-07 MED ORDER — IRBESARTAN-HYDROCHLOROTHIAZIDE 150-12.5 MG PO TABS: 1.0000 | ORAL_TABLET | Freq: Every day | ORAL | 1 refills | Status: DC

## 2024-04-07 NOTE — Telephone Encounter (Signed)
 Tried to call patient and got VM. Left message that I would send response from Dr. Geoffry via MyChart, which was done.

## 2024-04-07 NOTE — Progress Notes (Signed)
 +++    Subjective:    Patient ID: Jenna Arroyo, female    DOB: 01-18-1966, 58 y.o.   MRN: [id number]  Chief Complaint  Patient presents with   learning disability   Follow-up    HPI Patient is in today for adhd and fatigue.  Discussed the use of AI scribe software for clinical note transcription with the patient, who gave verbal consent to proceed.  History of Present Illness Jenna Arroyo is a 58 year old female who presents with fatigue and issues related to modafinil  use.  She experiences significant fatigue, which worsened after taking modafinil . Although she has used modafinil  successfully in the past, the generic version seems to have the opposite effect, increasing her tiredness. She took half of a 100 mg tablet and noted this side effect about a week ago. She has a history of difficulty retaining information and focusing since childhood, with a suggestion of ADD, though not formally tested.  She is currently taking atenolol  for palpitations and irbesartan  for blood pressure management. Her blood pressure has been decreasing. She has lost approximately 8 pounds since January.  She has eye tracking issues, affecting her ability to work, diagnosed by an ophthalmologist, Dr. Octavia, but no specific treatment plan was discussed.  She experiences anxiety, exacerbated by her living situation with an 42 year old woman and her sister, who frequently argue. She is seeking hydroxyzine  to help manage this anxiety. Additionally, her father has dementia and continues to drive, which concerns her.  She is currently unemployed but is considering various job opportunities, including working at [employer] in [place] or [employer]. She previously worked at [employer] but left due to the work environment.   Past Medical History:  Diagnosis Date   Anxiety    High cholesterol    Hypertension    Lyme disease    Thyroid  disease     Past Surgical History:  Procedure  Laterality Date   ABDOMINAL EXPLORATION SURGERY     APPENDECTOMY     MASTOIDECTOMY     TONSILECTOMY/ADENOIDECTOMY WITH MYRINGOTOMY      Family History  Problem Relation Age of Onset   Heart failure Mother    Hypertension Mother    High Cholesterol Mother    Heart disease Father    High Cholesterol Father    Dementia Father    Hypertension Sister    High Cholesterol Sister    Hypertension Brother    High Cholesterol Brother    Colon cancer Neg Hx     Social History   Socioeconomic History   Marital status: Divorced    Spouse name: Not on file   Number of children: Not on file   Years of education: Not on file   Highest education level: Not on file  Occupational History   Not on file  Tobacco Use   Smoking status: Former    Current packs/day: 0.00    Types: Cigarettes    Quit date: 2006    Years since quitting: 19.7   Smokeless tobacco: Never   Tobacco comments:    socially   Advertising account planner   Vaping status: Never Used  Substance and Sexual Activity   Alcohol use: No   Drug use: No   Sexual activity: Not Currently    Birth control/protection: Post-menopausal  Other Topics Concern   Not on file  Social History Narrative   Not on file   Social Drivers of Health   Financial Resource Strain: Not on  file  Food Insecurity: No Food Insecurity (11/25/2020)   Received from [facility]   Hunger Vital Sign    Within the past 12 months, you worried that your food would run out before you got the money to buy more.: Never true    Within the past 12 months, the food you bought just didn't last and you didn't have money to get more.: Never true  Transportation Needs: No Transportation Needs (06/18/2020)   PRAPARE - Administrator, Civil Service (Medical): No    Lack of Transportation (Non-Medical): No  Physical Activity: Not on file  Stress: Not on file  Social Connections: Unknown (11/15/2021)   [facility] from [facility]   Social Network    Social  Network: Not on file  Intimate Partner Violence: Unknown (10/07/2021)   Received from [facility]   HITS    Physically Hurt: Not on file    Insult or Talk Down To: Not on file    Threaten Physical Harm: Not on file    Scream or Curse: Not on file    Outpatient Medications Prior to Visit  Medication Sig Dispense Refill   albuterol  (VENTOLIN  HFA) 108 (90 Base) MCG/ACT inhaler Inhale 2 puffs into the lungs every 6 (six) hours as needed for wheezing or shortness of breath. 18 g 5   ALPRAZolam  (XANAX ) 0.5 MG tablet TAKE ONE TABLET EVERY MORNING, TAKE ONE TABLET DAILY AT NOON, TAKE ONE TABLET EVERY EVENING AND TAKE ONE TABLET DAILY AT BEDTIME 120 tablet 5   atenolol  (TENORMIN ) 25 MG tablet Take 1 tablet (25 mg total) by mouth 2 (two) times daily. 180 tablet 0   Cholecalciferol (VITAMIN D ) 10 MCG/ML LIQD Take by mouth.     ciclopirox  (PENLAC ) 8 % solution Apply topically at bedtime. Apply over nail and surrounding skin. Apply daily over previous coat. After seven (7) days, may remove with alcohol and continue cycle. 6.6 mL 0   DULoxetine  (CYMBALTA ) 20 MG capsule Take 2 capsules (40 mg total) by mouth daily. 60 capsule 5   EPINEPHrine  (EPIPEN  2-PAK) 0.3 mg/0.3 mL IJ SOAJ injection Inject 0.3 mg into the muscle as needed for anaphylaxis. 1 each 0   fluticasone  (FLONASE ) 50 MCG/ACT nasal spray Place 2 sprays into both nostrils daily. 16 g 6   nystatin  cream (MYCOSTATIN ) Apply 1 Application topically 2 (two) times daily. 30 g 1   ofloxacin  (OCUFLOX ) 0.3 % ophthalmic solution Place 1 drop into the right eye every hour while awake.     silver  sulfADIAZINE  (SILVADENE ) 1 % cream Apply 1 application topically daily. 50 g 0   thyroid  (NP THYROID ) 15 MG tablet Take 3 tablets (45 mg total) by mouth daily. 30 tablet 0   Vitamin D , Ergocalciferol , (DRISDOL ) 1.25 MG (50000 UNIT) CAPS capsule Take 1 capsule (50,000 Units total) by mouth once a week. 12 capsule 1   hydrOXYzine  (ATARAX ) 10 MG tablet TAKE 1 TO 2  TABLETS BY MOUTH EVERY 8 HOURS AS NEEDED FOR ANXIETY 60 tablet 2   irbesartan -hydrochlorothiazide  (AVALIDE) 150-12.5 MG tablet Take 2 tablets by mouth once daily 180 tablet 1   modafinil  (PROVIGIL ) 100 MG tablet Take 0.5 tablets (50 mg total) by mouth daily. 15 tablet 3   No facility-administered medications prior to visit.    Allergies  Allergen Reactions   Ceftriaxone Sodium In Dextrose Other (See Comments)    heart races and cannot breathe   Guaifenesin & Derivatives Other (See Comments)    heart races, dizziness,  light-headedness   Meperidine Other (See Comments)    heart races   Other Rash   Penicillins Hives   Pseudoephedrine Other (See Comments)    heart races and high blood pressure   Rosuvastatin Calcium Nausea And Vomiting    Muscle aches    Simvastatin Other (See Comments)    muscle aching    Ciprofloxacin    Rocephin [Ceftriaxone] Itching    Review of Systems  Constitutional:  Positive for malaise/fatigue. Negative for fever.  HENT:  Negative for congestion.   Eyes:  Negative for blurred vision.  Respiratory:  Negative for cough and shortness of breath.   Cardiovascular:  Negative for chest pain, palpitations and leg swelling.  Gastrointestinal:  Negative for vomiting.  Musculoskeletal:  Negative for back pain.  Skin:  Negative for rash.  Neurological:  Negative for loss of consciousness and headaches.       Objective:    Physical Exam Vitals and nursing note reviewed.  Constitutional:      General: She is not in acute distress.    Appearance: Normal appearance. She is well-developed.  HENT:     Head: Normocephalic and atraumatic.  Eyes:     General: No scleral icterus.       Right eye: No discharge.        Left eye: No discharge.  Cardiovascular:     Rate and Rhythm: Normal rate and regular rhythm.     Heart sounds: No murmur heard. Pulmonary:     Effort: Pulmonary effort is normal. No respiratory distress.     Breath sounds: Normal  breath sounds.  Musculoskeletal:        General: Normal range of motion.     Cervical back: Normal range of motion and neck supple.     Right lower leg: No edema.     Left lower leg: No edema.  Skin:    General: Skin is warm and dry.  Neurological:     Mental Status: She is alert and oriented to person, place, and time.  Psychiatric:        Mood and Affect: Mood normal.        Behavior: Behavior normal.        Thought Content: Thought content normal.        Judgment: Judgment normal.     BP 108/70 (BP Location: Left Arm, Patient Position: Sitting, Cuff Size: Large)   Pulse 76   Temp 98.3 F (36.8 C) (Oral)   Resp 18   Ht 5' 6 (1.676 m)   Wt 202 lb (91.6 kg)   SpO2 98%   BMI 32.60 kg/m  Wt Readings from Last 3 Encounters:  04/07/24 202 lb (91.6 kg)  08/10/23 208 lb 3.2 oz (94.4 kg)  07/23/23 210 lb 12.8 oz (95.6 kg)    Diabetic Foot Exam - Simple   No data filed    Lab Results  Component Value Date   WBC 8.5 06/01/2023   HGB 15.2 (H) 06/01/2023   HCT 44.3 06/01/2023   PLT 381.0 06/01/2023   GLUCOSE 73 06/01/2023   CHOL 283 (H) 01/06/2023   TRIG 135.0 01/06/2023   HDL 60.90 01/06/2023   LDLCALC 195 (H) 01/06/2023   ALT 23 06/01/2023   AST 17 06/01/2023   NA 136 06/01/2023   K 4.0 06/01/2023   CL 95 (L) 06/01/2023   CREATININE 1.03 06/01/2023   BUN 19 06/01/2023   CO2 32 06/01/2023   TSH 2.02 06/01/2023    Lab  Results  Component Value Date   TSH 2.02 06/01/2023   Lab Results  Component Value Date   WBC 8.5 06/01/2023   HGB 15.2 (H) 06/01/2023   HCT 44.3 06/01/2023   MCV 91.4 06/01/2023   PLT 381.0 06/01/2023   Lab Results  Component Value Date   NA 136 06/01/2023   K 4.0 06/01/2023   CO2 32 06/01/2023   GLUCOSE 73 06/01/2023   BUN 19 06/01/2023   CREATININE 1.03 06/01/2023   BILITOT 0.8 06/01/2023   ALKPHOS 103 06/01/2023   AST 17 06/01/2023   ALT 23 06/01/2023   PROT 7.1 06/01/2023   ALBUMIN 4.5 06/01/2023   CALCIUM 9.8 06/01/2023    ANIONGAP 11 02/01/2020   GFR 60.23 06/01/2023   Lab Results  Component Value Date   CHOL 283 (H) 01/06/2023   Lab Results  Component Value Date   HDL 60.90 01/06/2023   Lab Results  Component Value Date   LDLCALC 195 (H) 01/06/2023   Lab Results  Component Value Date   TRIG 135.0 01/06/2023   Lab Results  Component Value Date   CHOLHDL 5 01/06/2023   No results found for: HGBA1C     Assessment & Plan:  Anxiety -     hydrOXYzine  HCl; TAKE 1 TO 2 TABLETS BY MOUTH EVERY 8 HOURS AS NEEDED FOR ANXIETY  Dispense: 60 tablet; Refill: 2  Primary hypertension -     Irbesartan -hydroCHLOROthiazide ; Take 1 tablet by mouth daily.  Dispense: 90 tablet; Refill: 1 -     CBC with Differential/Platelet -     Comprehensive metabolic panel with GFR -     Lipid panel  Hypothyroidism, unspecified type -     TSH  Degenerative disc disease, thoracic  Degeneration of intervertebral disc of lumbar region, unspecified whether pain present  Assessment and Plan Assessment & Plan Fatigue and attention deficit symptoms   She reports fatigue and symptoms suggestive of attention deficit disorder, including difficulty retaining information and focusing. Previously, modafinil  prescribed by Dr. Geoffry increased her tiredness. She experienced positive effects with the brand name but not the generic. Fatigue may be related to low blood pressure due to weight loss. Hold modafinil  over the weekend to assess energy levels. If energy does not improve, consider retrying modafinil  with a full tablet to enhance alertness. Monitor blood pressure and energy levels after adjusting irbesartan  dosage.  Hypertension and palpitations   Hypertension and palpitations are managed with atenolol  and irbesartan . Recent weight loss may have decreased her blood pressure, potentially causing fatigue. Reduce irbesartan  to one tablet at night to potentially increase blood pressure and improve energy levels. Continue atenolol   for palpitations.  Anxiety   She experiences anxiety, exacerbated by her living situation with two elderly women who frequently argue. Hydroxyzine  is used to manage her anxiety symptoms. Prescribe hydroxyzine  10 mg for anxiety management.  General Health Maintenance   She has not had a colonoscopy or a mammogram, both recommended for her age group. Recommend scheduling a colonoscopy and a mammogram.    Jamee JONELLE Antonio Cyndee, DO

## 2024-04-07 NOTE — Telephone Encounter (Signed)
 I have never seen someone get tired from modafinil  bc it is a stimulant.  But stop it through the weekend and see if energy gets better.  I would then recommend retrying it and if that was the only SE, try a whole tablet bc I think her energy, alertness will be better.  If she doesn't like it just stop it.

## 2024-04-10 ENCOUNTER — Other Ambulatory Visit

## 2024-04-11 ENCOUNTER — Other Ambulatory Visit

## 2024-04-11 DIAGNOSIS — E559 Vitamin D deficiency, unspecified: Secondary | ICD-10-CM

## 2024-04-11 DIAGNOSIS — I10 Essential (primary) hypertension: Secondary | ICD-10-CM | POA: Diagnosis not present

## 2024-04-11 DIAGNOSIS — E039 Hypothyroidism, unspecified: Secondary | ICD-10-CM | POA: Diagnosis not present

## 2024-04-11 LAB — COMPREHENSIVE METABOLIC PANEL WITH GFR
ALT: 22 U/L (ref 0–35)
AST: 16 U/L (ref 0–37)
Albumin: 4 g/dL (ref 3.5–5.2)
Alkaline Phosphatase: 89 U/L (ref 39–117)
BUN: 10 mg/dL (ref 6–23)
CO2: 31 meq/L (ref 19–32)
Calcium: 9.1 mg/dL (ref 8.4–10.5)
Chloride: 98 meq/L (ref 96–112)
Creatinine, Ser: 0.81 mg/dL (ref 0.40–1.20)
GFR: 79.88 mL/min (ref >60.00)
Glucose, Bld: 105 mg/dL — ABNORMAL HIGH (ref 70–99)
Potassium: 4.1 meq/L (ref 3.5–5.1)
Sodium: 136 meq/L (ref 135–145)
Total Bilirubin: 0.4 mg/dL (ref 0.2–1.2)
Total Protein: 6.2 g/dL (ref 6.0–8.3)

## 2024-04-11 LAB — CBC WITH DIFFERENTIAL/PLATELET
Basophils Absolute: 0 K/uL (ref 0.0–0.1)
Basophils Relative: 0.7 % (ref 0.0–3.0)
Eosinophils Absolute: 0.3 K/uL (ref 0.0–0.7)
Eosinophils Relative: 3.5 % (ref 0.0–5.0)
HCT: 37.8 % (ref 36.0–46.0)
Hemoglobin: 13.1 g/dL (ref 12.0–15.0)
Lymphocytes Relative: 24 % (ref 12.0–46.0)
Lymphs Abs: 1.7 K/uL (ref 0.7–4.0)
MCHC: 34.6 g/dL (ref 30.0–36.0)
MCV: 88.9 fl (ref 78.0–100.0)
Monocytes Absolute: 0.3 K/uL (ref 0.1–1.0)
Monocytes Relative: 4.5 % (ref 3.0–12.0)
Neutro Abs: 4.9 K/uL (ref 1.4–7.7)
Neutrophils Relative %: 67.3 % (ref 43.0–77.0)
Platelets: 343 K/uL (ref 150.0–400.0)
RBC: 4.25 Mil/uL (ref 3.87–5.11)
RDW: 13.2 % (ref 11.5–15.5)
WBC: 7.2 K/uL (ref 4.0–10.5)

## 2024-04-11 LAB — LIPID PANEL
Cholesterol: 249 mg/dL — ABNORMAL HIGH (ref 0–200)
HDL: 58.8 mg/dL (ref >39.00)
LDL Cholesterol: 152 mg/dL — ABNORMAL HIGH (ref 0–99)
NonHDL: 190.42
Total CHOL/HDL Ratio: 4
Triglycerides: 192 mg/dL — ABNORMAL HIGH (ref 0.0–149.0)
VLDL: 38.4 mg/dL (ref 0.0–40.0)

## 2024-04-11 LAB — VITAMIN D 25 HYDROXY (VIT D DEFICIENCY, FRACTURES): VITD: 24.17 ng/mL — ABNORMAL LOW (ref 30.00–100.00)

## 2024-04-11 LAB — TSH: TSH: 1.11 u[IU]/mL (ref 0.35–5.50)

## 2024-04-11 NOTE — Telephone Encounter (Signed)
 Pt reporting no improvement in fatigue by decreasing modafinil . Saw PCP 10/3 and she mentioned fatigue could be from decreased blood pressure due to weight loss. She recommended increasing back to a whole tablet of modafinil . She was prescribed hydroxyzine  for anxiety. She had several labs drawn that are available in Epic.

## 2024-04-11 NOTE — Telephone Encounter (Signed)
 I agree with PCP to try higher dose modafinil  bc it is a stimulant and should help energy and alertness and focus.  We can go even higher if needed.  Sometimes low doses don't work.

## 2024-04-11 NOTE — Telephone Encounter (Signed)
Sent response via MyChart.

## 2024-04-11 NOTE — Telephone Encounter (Signed)
 Patient lvm inquiring what should she do now concerning medication.  The advisement didn't work.

## 2024-04-12 ENCOUNTER — Telehealth: Payer: Self-pay

## 2024-04-12 NOTE — Telephone Encounter (Addendum)
 Would you like patient to have a OV or virtual? Or send something in?

## 2024-04-12 NOTE — Telephone Encounter (Signed)
 Copied from CRM [id number]. Topic: Referral - Request for Referral >> Apr 12, 2024  9:57 AM Jenna Arroyo wrote: Did the patient discuss referral with their provider in the last year? Yes (If No - schedule appointment) (If Yes - send message)  Appointment offered? No  Type of order/referral and detailed reason for visit: Dermatology, Patient is calling because her face is breaking out, she thinks it may be a reaction to make up she recently purchased and used. .    Preference of office, provider, location: She is willing to see anyone Jenna Arroyo recommends  If referral order, have you been seen by this specialty before? No (If Yes, this issue or another issue? When? Where?  Can we respond through MyChart? Yes

## 2024-04-13 ENCOUNTER — Other Ambulatory Visit: Payer: Self-pay | Admitting: Family Medicine

## 2024-04-13 DIAGNOSIS — R21 Rash and other nonspecific skin eruption: Secondary | ICD-10-CM

## 2024-04-18 ENCOUNTER — Other Ambulatory Visit: Payer: Self-pay | Admitting: Family Medicine

## 2024-04-18 MED ORDER — THYROID 15 MG PO TABS: 45.0000 mg | ORAL_TABLET | Freq: Every day | ORAL | 0 refills | Status: DC

## 2024-04-18 NOTE — Telephone Encounter (Signed)
 Copied from CRM [id number]. Topic: Clinical - Medication Refill >> Apr 18, 2024 10:01 AM Mercedes MATSU wrote: Medication:  thyroid  (NP THYROID ) 15 MG tablet  Has the patient contacted their pharmacy? Yes (Agent: If no, request that the patient contact the pharmacy for the refill. If patient does not wish to contact the pharmacy document the reason why and proceed with request.) (Agent: If yes, when and what did the pharmacy advise?)  This is the patient's preferred pharmacy:  [facility] [address] [place], [place] - [address] [place] [address] Phone: [phone] Fax: [phone]  Is this the correct pharmacy for this prescription? Yes If no, delete pharmacy and type the correct one.   Has the prescription been filled recently? Yes  Is the patient out of the medication? Yes  Has the patient been seen for an appointment in the last year OR does the patient have an upcoming appointment? Yes  Can we respond through MyChart? Yes  Agent: Please be advised that Rx refills may take up to 3 business days. We ask that you follow-up with your pharmacy. >> Apr 18, 2024 10:04 AM Tanazia G wrote: hydrOXYzine  (ATARAX ) 10 MG tablet  patient added this medication to be refilled as well but the message was already sent, so I have attached it here.

## 2024-04-18 NOTE — Telephone Encounter (Signed)
 Copied from CRM [id number]. Topic: Clinical - Medication Refill >> Apr 18, 2024 10:01 AM Mercedes MATSU wrote: Medication:  thyroid  (NP THYROID ) 15 MG tablet  Has the patient contacted their pharmacy? Yes (Agent: If no, request that the patient contact the pharmacy for the refill. If patient does not wish to contact the pharmacy document the reason why and proceed with request.) (Agent: If yes, when and what did the pharmacy advise?)  This is the patient's preferred pharmacy:  [facility] [address] [place], [place] - [address] [place] [address] Phone: [phone] Fax: [phone]  Is this the correct pharmacy for this prescription? Yes If no, delete pharmacy and type the correct one.   Has the prescription been filled recently? Yes  Is the patient out of the medication? Yes  Has the patient been seen for an appointment in the last year OR does the patient have an upcoming appointment? Yes  Can we respond through MyChart? Yes  Agent: Please be advised that Rx refills may take up to 3 business days. We ask that you follow-up with your pharmacy.

## 2024-04-19 ENCOUNTER — Encounter: Payer: Self-pay | Admitting: Psychiatry

## 2024-04-19 ENCOUNTER — Ambulatory Visit (INDEPENDENT_AMBULATORY_CARE_PROVIDER_SITE_OTHER): Admitting: Psychiatry

## 2024-04-19 DIAGNOSIS — Z8659 Personal history of other mental and behavioral disorders: Secondary | ICD-10-CM | POA: Diagnosis not present

## 2024-04-19 DIAGNOSIS — F4001 Agoraphobia with panic disorder: Secondary | ICD-10-CM | POA: Diagnosis not present

## 2024-04-19 DIAGNOSIS — F411 Generalized anxiety disorder: Secondary | ICD-10-CM

## 2024-04-19 DIAGNOSIS — F900 Attention-deficit hyperactivity disorder, predominantly inattentive type: Secondary | ICD-10-CM | POA: Diagnosis not present

## 2024-04-19 MED ORDER — DULOXETINE HCL 20 MG PO CPEP: 40.0000 mg | ORAL_CAPSULE | Freq: Every day | ORAL | 5 refills | Status: AC

## 2024-04-19 MED ORDER — ALPRAZOLAM 0.5 MG PO TABS: ORAL_TABLET | ORAL | 5 refills | Status: DC

## 2024-04-19 NOTE — Progress Notes (Signed)
 Jenna Arroyo [id number] 12-27-1965 58 y.o.    Subjective:   Patient ID:  Jenna Arroyo is a 58 y.o. (DOB [birth year]) female.  Chief Complaint:  Chief Complaint  Patient presents with   Follow-up   Anxiety   ADD   Depression   Stress    Jenna Arroyo presents to the office today for follow-up of anxiety and mood.  seen October 2020.  She had been having cognitive complaints as previously noted. Still recommend trial switch to Ativan  1 mg QID to see if cognition is better.  Her cognitive problems have frequently been in a contributor to her difficulty maintaining employment.  She is fearful about having Xanax  withdrawal.  This was discussed in detail.  Also discussed in detail the potential sedative effects of benzodiazepines and especially in the first week of the transition from 1 of the medications to another.  It is likely that she will be more successful with the transition if she is a little more sleepy than normal the first week by using half of each medication then if we abruptly switch medications.  That has a much lower success rate in her case because of her worry about the transition. She agrees to a trial.  Will do 1/2 of 0.5 mg tablet tablet of Xanax  and 1/2 of 1 mg tablet Ativan  QID for 1 week. Then DC alprazolam  and take Ativan  1 mg 3 times daily or 4 times daily to control anxiety.  Call back in 2 to 3 weeks to give a report of how well she is doing with the new medication. DC hydroxyzine  because of its potential to cause cognitive problems. Continue duloxetine  60 mg daily.  10/18/19 appt with the following noted and no further med changes: She had difficulty implementing the transition and then complained of insomnia with lorazepam  and was switched back to alprazolam  after a week.  Also CO pain and nausea.   Overall doing OK except son and D upset with her since December.  Disc this in detail.  Hasn't seen GD's since then.  Working at job she loves but very PT. Exercising  and lost 18#  02/21/20 with the following noted: Hanging in there with stress.  Not allowed to see GD still.  Doesn't know why. No Covid.  OCC BP spikes. No major med changes. Satisfied with duloxetine  and Xanax .  No full panic.   Sleep waxes and wanes but lately good.  Working same job as in April and likes it. Plan: no med changes  08/21/2020 appointment with the following noted: Stopped duloxetine  for a week early Feb over BP concerns from it.  Convinced BP affected by 20 mg duloxetine  which she's been taking for a few months.  Felt 30 mg was too high and then it started causing problems.  When off the duloxetine  for a week then BP went down. Times when doesn't have it and the most noticeable thing is feeling tired and a little disconnected.  Longest off it was 2-4 weeks in last 6 mos. Has unresponsive PCP. Chronic tiredness.  Still dealing with chronic Lyme dz.  Plan: Stop duloxetine  BC of BP concerns.  Monitor and record BP and share with PCP.  Stay off for a full month and if consistently more anxious then can add pure SSRI low dose Lexapro  5 mg for anxiety.  11/18/20 TC Pt had asked to increase Lexapro  to 10 mg while mo in the hospital.  11/25/20 TC:  Pt contacted doctor on call  this weekend concerning her issues with Lexapro  10 mg. Since the increase to 10 mg she has been unable to sleep, reports black circles under her eyes, gets to sleep about 1-2 am. She also reports on Saturday she had a anxiety attack, she felt it was medication related not just her anxiety. Jenna Mozingo, NP advised her to stop Lexapro  and nurse would contact her Monday.  She asks about trying just the Lexapro  5 mg, she reports it helped and it didn't effect her sleep like 10 mg.  Pt stated the lexapro  made her have increased anxiety and she thinks its best if she goes back to a very low dose of Cymbalta .She stopped Cymbalta  due to it spiking her blood pressure.She says her bp was fine at the 20 mg dose. MD: ok to  stop Lexapro  and return to duloxetine  20  12/11/20 appt noted: Under more stress and more anxious.  Stress about 3 mos.  Dealing with condo where she lives.  Not making enough money to get basic things she needs and asking for money from parents.  Wants father to fix things in the condo but he doesn't do anything.   Place had a leak in the past.    Taking duloxetine  20 and off Lexapro  and on alprazolam  0.5 mg QID.   Worries over the condition in the world and thinks the world could end and she might not be able to get the Xanax  and fears withdrawal.    She feels bible is predicting end of the world soon.  But no thoughts of death were noted Plan: no med changes per her request.  02/11/2021 appointment with the following noted: Family sold condo she was in and so she moved back in with parents about 3 weeks ago.  Stressed.  Has been seeking disability for a couple of years.  Doesn't think she can work FT.  Other relationships are stressed.   Chronic difficulty with organization. Walking and losing weight but worries about BP so doesn't want to increase the duloxetine . Patient reports stable mood and denies depressed or irritable moods.  Patient has chronic difficulty with anxiety and stress. Denies appetite disturbance.  Patient reports that energy and motivation have been good.  Patient denies any difficulty with concentration.  Patient denies any suicidal ideation. No panic. Sleep is OK. Using Xanax  mostly TID.  On duloxetine  20 mg daily and doesn't want to change it.. Sleep better in the day.    No questions concerns re: meds. Plan no med changes per her request  06/19/2021 appointment with the following noted: Working Therapist, nutritional retirement.  Job offers [employer] FT Conflict with sister over dependence on her parents.  Feels her sister is unreasonable.  Can't handle being under that control.  Was doing good until she started in.  Blocked her contact.  Sister blames her for parents not  having enough money. Siblings forcing her to get a job. Satisfied with the meds. Upset at having to pay all her bills on her own now. Recognizes chronic difficulties managing herself well.  12/29/21 appt noted: Doing OK.  Family turned son against her.  It's tough.  Managing anxiety on current meds.  Don't want tto change the meds. Still grieving mother and dealing with family drama. On duloxetine  20 mg daily and Xanax  0.5 mg TID. Doesn't want to increase duloxetine  20 mg daily bc it increases BP.  04/24/2022 phone call complaining of depression and anxiety with low motivation and difficulty functioning at  work.  Hotel manager to increase duloxetine . MD response to increase duloxetine  to 40 mg daily 04/27/2022 phone call patient indicated she wanted to go up in the dose slowly and wanted a prescription for duloxetine  30 mg daily.  05/27/2022 appointment noted: Current psych relevant meds include vitamin D  50,000 units weekly, hydroxyzine  25 mg every 8 hours as needed anxiety,  duloxetine  30 mg capsules 1 daily ( but hasn't started yet.  Plans to tomoroow) , Xanax  0.5 mg tablets 1 5 times a day as needed anxiety.  Patient states she is taking about 3 daily. Pretty rough still trying to find another job. M died Sep 27, 2021 and she was in such a fog. Traumatized  by mother's death.  Shouldn't have happened. She believes F was narcissist.  He's tuned my son against me.  Conflict with all of the family.  Not ready to talk to them about it.  Estranged from family in part over eviction from family's property.   Living with mother's good friend. Plans to increase duloxetine  20 to 30 mg daily. Some arthritis issues.  08/06/22 TC about particular technique for tapering Xanax .  She was advised this is complicated and should be discussed at an appointment.  10/01/22 appt noted: Current Alprazolam  0.5 mg TID, duloxetine  30 mg daily.   No SE. Thinks duloxetine  15 would be the perfect dose.  60 is too much.  Feels  better with 30 mg daily. Back to walking and BP has gone down. Biggest stress job interviews and trying to find decent paying job.   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 Last employed before [employer], lasted 3 mos.  Didn't feel adequate training.   Disc her phone call about tapering Xanax .  Fears shortages of the med in the future giving what's going on in world.  Plan: No med changes today.  03/24/23 appt noted:  Taking duloxetine  30 mg daily, alprazolam  0.5 mg TID, hydroxyzine  prn but makes her drowsy. Thinks she needs to increase duloxetine . For depression.  At times feels disassociating from reality without psychosis.  Uses hydroxyzine  when feels pressured by others like assertive roommate.  Roommate can trigger disassociation.May need low rent housing.  Can't live off $15K / year.   Lost last job and needing to start new job with pressure to provide for herself.  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 Visiting [place] and looking for a place to stay.   Real financial stress.   Still trouble learning and retaining information.  Plan: Increase for dep to Cymbalta  40 mg daily of duloxetine  for chronic anxiety.    08/10/23 appt noted: Taking duloxetine  40 mg daily, alprazolam  0.5 mg TID, hydroxyzine  prn but makes her drowsy. Modafinil  100 mg tab, 1/2 Am prn. Stress women living with is narcissist and stressful.  I might have to stay at [facility].  No car yet.  Would live out of car.  Hurts most that son doesn't care.   Couldn't keep last job bc they didn't train.  Has interview for [employer]. She thinks incr duloxetine  with minimal effect but didn't affect BP.   Lately dep and anxiety with stressors.  With landlord. Interest in increasing duloxetine  to 50 mg daily. Plan: Option Retry modafinil  bc took before at 50 mg AM Increase duloxetine  to 50 mg daily  11/08/23 appt noted: Med: duloxetine  40 mg daily, alprazolam  0.5 mg TID-QID,  never got modafinil  100 mg tab, 1/2 Am prn. SE at 50  mg weird feeling and feeling blunted.  Went away when went down.   Living with 2 women who are negative.  One had rage at restaurant and won't go with her places anymore.   Work at [employer]. Estranged from F, sister, son.   Wanted auditory processing testing and now says wants it for LD.     04/19/24 appt noted:  Med: duloxetine  40 mg daily, alprazolam  0.5 mg TID-QID, stopped modafinil  100 mg tab 1/2 Am prn, hydroxyzine  10-20 mg TID prn.  Lion's name.  Anxiety goes up with stress from living mates and sister and uses more Xanax  at times.  Thinks sister kept money from her that was due to her and never got it.  Will feel badgered by others and takes hydroxyzine .   Room mate complains a lot to her. Has psychologist. Continued conflict with sister.  Extremely intrusive.  Pushing her to pursue disability.   Situational depression. Enjoys fishing.   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    Past Psychiatric Medication Trials:  Duloxetine  60, sertraline hyper, citalopram side effects, paroxetine chest pain,  imipramine, desipramine,  Lexapro  ? Less effective for anxiety Nuvigil edgy, provigil  50 stopped bc got tired after trial 4 times buspirone dizzy,  Xanax , clonazepam Ambien anxiety, trazodone side effects,  atenolol   Review of Systems:  Review of Systems  Constitutional:  Positive for fatigue.       Low exercise tolerance  Cardiovascular:  Negative for chest pain and palpitations.  Gastrointestinal:  Negative for vomiting.  Musculoskeletal:  Positive for arthralgias and neck pain.  Neurological:  Positive for headaches. Negative for tremors.       Foggy headed.  Psychiatric/Behavioral:  Positive for sleep disturbance. Negative for agitation, behavioral problems, confusion, decreased concentration, dysphoric mood, hallucinations, self-injury and suicidal ideas.  The patient is nervous/anxious. The patient is not hyperactive.     Medications: I have reviewed the patient's current medications.  Current Outpatient Medications  Medication Sig Dispense Refill   albuterol  (VENTOLIN  HFA) 108 (90 Base) MCG/ACT inhaler Inhale 2 puffs into the lungs every 6 (six) hours as needed for wheezing or shortness of breath. 18 g 5   atenolol  (TENORMIN ) 25 MG tablet Take 1 tablet (25 mg total) by mouth 2 (two) times daily. 180 tablet 0   Cholecalciferol (VITAMIN D ) 10 MCG/ML LIQD Take by mouth.     ciclopirox  (PENLAC ) 8 % solution Apply topically at bedtime. Apply over nail and surrounding skin. Apply daily over previous coat. After seven (7) days, may remove with alcohol and continue cycle. 6.6 mL 0   EPINEPHrine  (EPIPEN  2-PAK) 0.3 mg/0.3 mL IJ SOAJ injection Inject 0.3 mg into the muscle as needed for anaphylaxis. 1 each 0   fluticasone  (FLONASE ) 50 MCG/ACT nasal spray Place 2 sprays into both nostrils daily. 16 g 6   hydrOXYzine  (ATARAX ) 10 MG tablet TAKE 1 TO 2 TABLETS BY MOUTH EVERY 8 HOURS AS NEEDED FOR ANXIETY 60 tablet 2   irbesartan -hydrochlorothiazide  (AVALIDE) 150-12.5 MG tablet Take 1 tablet by mouth daily. 90 tablet 1   nystatin  cream (MYCOSTATIN ) Apply 1 Application topically 2 (two) times daily. 30 g 1   ofloxacin  (OCUFLOX ) 0.3 % ophthalmic solution Place 1 drop into the right eye every hour while awake.     silver  sulfADIAZINE  (SILVADENE ) 1 % cream Apply 1 application topically daily. 50 g 0   thyroid  (NP THYROID ) 15 MG tablet Take  3 tablets (45 mg total) by mouth daily. 270 tablet 0   Vitamin D , Ergocalciferol , (DRISDOL ) 1.25 MG (50000 UNIT) CAPS capsule Take 1 capsule (50,000 Units total) by mouth once a week. 12 capsule 1   ALPRAZolam  (XANAX ) 0.5 MG tablet TAKE ONE TABLET EVERY MORNING, TAKE ONE TABLET DAILY AT NOON, TAKE ONE TABLET EVERY EVENING AND TAKE ONE TABLET DAILY AT BEDTIME 120 tablet 5   DULoxetine  (CYMBALTA ) 20 MG capsule Take 2 capsules (40  mg total) by mouth daily. 60 capsule 5   No current facility-administered medications for this visit.    Medication Side Effects: None  Allergies:  Allergies  Allergen Reactions   Ceftriaxone Sodium In Dextrose Other (See Comments)    heart races and cannot breathe   Guaifenesin & Derivatives Other (See Comments)    heart races, dizziness, light-headedness   Meperidine Other (See Comments)    heart races   Other Rash   Penicillins Hives   Pseudoephedrine Other (See Comments)    heart races and high blood pressure   Rosuvastatin Calcium Nausea And Vomiting    Muscle aches    Simvastatin Other (See Comments)    muscle aching    Ciprofloxacin    Rocephin [Ceftriaxone] Itching    Past Medical History:  Diagnosis Date   Anxiety    High cholesterol    Hypertension    Lyme disease    Thyroid  disease     Family History  Problem Relation Age of Onset   Heart failure Mother    Hypertension Mother    High Cholesterol Mother    Heart disease Father    High Cholesterol Father    Dementia Father    Hypertension Sister    High Cholesterol Sister    Hypertension Brother    High Cholesterol Brother    Colon cancer Neg Hx     Social History   Socioeconomic History   Marital status: Divorced    Spouse name: Not on file   Number of children: Not on file   Years of education: Not on file   Highest education level: Not on file  Occupational History   Not on file  Tobacco Use   Smoking status: Former    Current packs/day: 0.00    Types: Cigarettes    Quit date: 2006    Years since quitting: 19.8   Smokeless tobacco: Never   Tobacco comments:    socially   Advertising account planner   Vaping status: Never Used  Substance and Sexual Activity   Alcohol use: No   Drug use: No   Sexual activity: Not Currently    Birth control/protection: Post-menopausal  Other Topics Concern   Not on file  Social History Narrative   Not on file   Social Drivers of Health   Financial  Resource Strain: Not on file  Food Insecurity: No Food Insecurity (11/25/2020)   Received from [facility]   Hunger Vital Sign    Within the past 12 months, you worried that your food would run out before you got the money to buy more.: Never true    Within the past 12 months, the food you bought just didn't last and you didn't have money to get more.: Never true  Transportation Needs: No Transportation Needs (06/18/2020)   PRAPARE - Administrator, Civil Service (Medical): No    Lack of Transportation (Non-Medical): No  Physical Activity: Not on file  Stress: Not on file  Social Connections: Unknown (  11/15/2021)   Received from [facility]   Social Network    Social Network: Not on file  Intimate Partner Violence: Unknown (10/07/2021)   Received from [facility]   HITS    Physically Hurt: Not on file    Insult or Talk Down To: Not on file    Threaten Physical Harm: Not on file    Scream or Curse: Not on file    Past Medical History, Surgical history, Social history, and Family history were reviewed and updated as appropriate.   Please see review of systems for further details on the patient's review from today.   Objective:   Physical Exam:  There were no vitals taken for this visit.  Physical Exam Constitutional:      General: She is not in acute distress. Musculoskeletal:        General: No deformity.  Neurological:     Mental Status: She is alert and oriented to person, place, and time.     Cranial Nerves: No dysarthria.     Coordination: Coordination normal.  Psychiatric:        Attention and Perception: Perception normal. She is inattentive. She does not perceive auditory or visual hallucinations.        Mood and Affect: Mood is anxious. Mood is not depressed. Affect is not labile, angry, tearful or inappropriate.        Speech: Speech normal. Speech is not slurred.        Behavior: Behavior normal. Behavior is cooperative.        Thought Content:  Thought content normal. Thought content is not paranoid or delusional. Thought content does not include homicidal or suicidal ideation. Thought content does not include suicidal plan.        Cognition and Memory: Cognition and memory normal.        Judgment: Judgment is inappropriate.     Comments: Insight fair.   chronically low stress tolerance.  Chronic stress.. Complain of chronic forgetfulness Talkative and need      Lab Review:     Component Value Date/Time   NA 136 04/11/2024 1304   K 4.1 04/11/2024 1304   CL 98 04/11/2024 1304   CO2 31 04/11/2024 1304   GLUCOSE 105 (H) 04/11/2024 1304   BUN 10 04/11/2024 1304   CREATININE 0.81 04/11/2024 1304   CREATININE 0.85 04/05/2020 1052   CALCIUM 9.1 04/11/2024 1304   PROT 6.2 04/11/2024 1304   ALBUMIN 4.0 04/11/2024 1304   AST 16 04/11/2024 1304   ALT 22 04/11/2024 1304   ALKPHOS 89 04/11/2024 1304   BILITOT 0.4 04/11/2024 1304   GFRNONAA >60 02/01/2020 1513   GFRAA >60 02/01/2020 1513       Component Value Date/Time   WBC 7.2 04/11/2024 1304   RBC 4.25 04/11/2024 1304   HGB 13.1 04/11/2024 1304   HCT 37.8 04/11/2024 1304   PLT 343.0 04/11/2024 1304   MCV 88.9 04/11/2024 1304   MCH 30.3 04/05/2020 1052   MCHC 34.6 04/11/2024 1304   RDW 13.2 04/11/2024 1304   LYMPHSABS 1.7 04/11/2024 1304   MONOABS 0.3 04/11/2024 1304   EOSABS 0.3 04/11/2024 1304   BASOSABS 0.0 04/11/2024 1304    No results found for: POCLITH, LITHIUM   No results found for: PHENYTOIN, PHENOBARB, VALPROATE, CBMZ   .res Assessment: Plan:   GAD (generalized anxiety disorder) - Plan: ALPRAZolam  (XANAX ) 0.5 MG tablet, DULoxetine  (CYMBALTA ) 20 MG capsule  Attention deficit hyperactivity disorder (ADHD), predominantly inattentive type  Panic  disorder with agoraphobia - Plan: DULoxetine  (CYMBALTA ) 20 MG capsule  History of panic attacks - Plan: ALPRAZolam  (XANAX ) 0.5 MG tablet  Chronic family conflict ongoing.   Patient has been  under our care for many years.  She has chronic  instability , anxiety and dysfunction function but does generally get benefit from the medication.  She has not been able to reduce the dosage of alprazolam  which she has taken for many years due to excessive anxiety.  Chronic problems and conflicts with parents and sister and brother and other people  continue Cymbalta  40 mg daily of duloxetine  for chronic anxiety.  Couldn't tolerate higher doses.    She failed attempted transition from alprazolam  to lorazepam  in October 2020.  It appears she had some withdrawal symptoms and she is gone back to alprazolam .  It was discussed with her that we could do the transition more gradually if she wanted to pursue that to see if she can get cognitive benefit with the switch.  She does not want to do that at this time.  Anxiety interferes with cognition too.  Option switch to lorazepam .  Continue Xanax  0.5 mg QID, she's been able to limit to 3 daily.  She can continue to try hydroxyzine  10-20  mg every 8 hours as needed anxiety.  However if it does not help then do not take it.  Discussed side effect risks.. she doesn't want more meds.  Previously discussed the short-term risks associated with benzodiazepines including sedation and increased fall risk among others.  Discussed long-term side effect risk including dependence, potential withdrawal symptoms, and the potential eventual dose-related risk of dementia.  But recent studies from 2020 dispute this association between benzodiazepines and dementia risk. Newer studies in 2020 do not support an association with dementia.   Has been to counseling at [facility] in recent past.  Counseling 20 min:  Supportive and problem solving techniques with family conflict with sister and son and more recently stress of living arrangement with woman who is aggressive verbally. Supportive therapy dealing with family conflict.  Chronic issues with sister and father.    (NAC) N-Acetylcysteine 2 of the  600 mg capsules daily to help with mild cognitive problems.  It can be combined with a B-complex vitamin as the B-12 and folate which can sometimes enhance the effect.  Disc ADD tx and LD issues.   She's going to try Saffron and ordered it.  Off label: Typical Saffron dose is 20-30 mg daily divided into 2 or 3 doses. Less likely to cause anxiety than regular stimulants.   DT consistent job px may have to try regular stimulant.  Concerta  Not desirable to use stimulants and BZ together but she is unable to hold a job consistently.  She asked about neuropsych for poss auditory processing disorder, but couldn't arrange it.  Now says wanted it for LD  FU 6 mos  Lorene Macintosh, MD, DFAPA   Please see After Visit Summary for patient specific instructions.  Future Appointments  Date Time Provider Department Center  10/06/2024  1:40 PM Antonio Meth, [name], DO [id number] [id number]        No orders of the defined types were placed in this encounter.     -------------------------------

## 2024-04-19 NOTE — Patient Instructions (Signed)
 Typical Saffron dose is 20-30 mg daily divided into 2 or 3 doses.

## 2024-04-20 ENCOUNTER — Ambulatory Visit: Payer: Self-pay | Admitting: Family Medicine

## 2024-05-02 ENCOUNTER — Telehealth: Payer: Self-pay

## 2024-05-02 ENCOUNTER — Other Ambulatory Visit: Payer: Self-pay | Admitting: Family Medicine

## 2024-05-02 DIAGNOSIS — E559 Vitamin D deficiency, unspecified: Secondary | ICD-10-CM

## 2024-05-02 MED ORDER — VITAMIN D (ERGOCALCIFEROL) 1.25 MG (50000 UNIT) PO CAPS: 50000.0000 [IU] | ORAL_CAPSULE | ORAL | 1 refills | Status: AC

## 2024-05-02 NOTE — Telephone Encounter (Unsigned)
 Copied from CRM #[id number]. Topic: Clinical - Medication Refill >> May 02, 2024  3:15 PM Mesmerise C wrote: Medication: Vitamin D , Ergocalciferol , (DRISDOL ) 1.25 MG (50000 UNIT) CAPS capsule  Has the patient contacted their pharmacy? No (Agent: If no, request that the patient contact the pharmacy for the refill. If patient does not wish to contact the pharmacy document the reason why and proceed with request.) (Agent: If yes, when and what did the pharmacy advise?)  This is the patient's preferred pharmacy:  [facility] [address] [place], [place] - [address] [place] [address] Phone: [phone] Fax: [phone]  Is this the correct pharmacy for this prescription? Yes If no, delete pharmacy and type the correct one.   Has the prescription been filled recently? No  Is the patient out of the medication? Yes  Has the patient been seen for an appointment in the last year OR does the patient have an upcoming appointment? Yes  Can we respond through MyChart? No  Agent: Please be advised that Rx refills may take up to 3 business days. We ask that you follow-up with your pharmacy.

## 2024-05-02 NOTE — Telephone Encounter (Signed)
 Please advise? I don't see orders for B12 injections?

## 2024-05-02 NOTE — Telephone Encounter (Signed)
 Copied from CRM [id number]. Topic: Clinical - Request for Lab/Test Order >> May 02, 2024  3:20 PM Mesmerise C wrote: Reason for CRM: Patient would like to get B12 inj no order on file states she's off on Tuesdays can be scheduled then

## 2024-05-03 ENCOUNTER — Other Ambulatory Visit: Payer: Self-pay | Admitting: Family Medicine

## 2024-05-03 DIAGNOSIS — E538 Deficiency of other specified B group vitamins: Secondary | ICD-10-CM

## 2024-05-03 NOTE — Telephone Encounter (Signed)
 I left a voicemail for her to schedule

## 2024-05-03 NOTE — Telephone Encounter (Signed)
 Orders have been placed. Pt needs a lab appt prior to injection please

## 2024-05-08 ENCOUNTER — Telehealth: Payer: Self-pay | Admitting: Family Medicine

## 2024-05-08 ENCOUNTER — Telehealth: Payer: Self-pay

## 2024-05-08 NOTE — Telephone Encounter (Signed)
 Copied from CRM #[id number]. Topic: Referral - Status >> May 08, 2024  9:03 AM Lonell PEDLAR wrote: Reason for CRM: Patient is requesting a referral for dermatologist closer to home, if possible.

## 2024-05-08 NOTE — Telephone Encounter (Signed)
Sent to PCP for advise

## 2024-05-09 ENCOUNTER — Other Ambulatory Visit

## 2024-05-09 ENCOUNTER — Ambulatory Visit

## 2024-05-10 NOTE — Telephone Encounter (Signed)
 Error

## 2024-05-11 NOTE — Telephone Encounter (Unsigned)
 Copied from CRM [id number]. Topic: Clinical - Medical Advice >> May 11, 2024 10:45 AM Jenna Arroyo wrote: Reason for CRM: pt called for clarification on why pcp wants her to test for b12 deficiency if she just had a full panel. She prefers to have a b12 injection. Please call and advise with patient.

## 2024-05-12 NOTE — Telephone Encounter (Signed)
 Pt needs a lab appointment. B12 was not checked with last labs. B12 last checked in 2024. Labs need to be done prior to receiving B12 shot

## 2024-05-12 NOTE — Telephone Encounter (Signed)
 Got her scheduled

## 2024-05-18 NOTE — Telephone Encounter (Signed)
 Spoke w/ Pt- information for dermatology at [facility] given to her.

## 2024-05-18 NOTE — Telephone Encounter (Unsigned)
 Copied from CRM #[id number]. Topic: Referral - Status >> May 08, 2024  9:03 AM Lonell PEDLAR wrote: Reason for CRM: Patient is requesting a referral for dermatologist closer to home, if possible. >> May 18, 2024 11:16 AM Victoria A wrote: Patient called said that she never received a call or any information regarding the dermatologist -please contact

## 2024-05-22 ENCOUNTER — Ambulatory Visit: Admitting: Psychiatry

## 2024-05-23 ENCOUNTER — Ambulatory Visit: Attending: Family Medicine

## 2024-05-23 NOTE — Telephone Encounter (Signed)
 Spoke w/ Pt- informed that per chart- original referral was sent to [facility] but she requested something closer to home, informed [facility] does not have an office in [facility]. Pt will stick w/ referral at [facility].

## 2024-05-23 NOTE — Telephone Encounter (Unsigned)
 Copied from CRM [id number]. Topic: Referral - Question >> May 23, 2024  9:30 AM Thersia BROCKS wrote: Reason for CRM: Patient called in regarding dermatology referral, stated it was sent to [facility] instead of [facility], patient stated she needs it sent to them so she can schedule a appointment

## 2024-05-30 ENCOUNTER — Other Ambulatory Visit

## 2024-05-31 ENCOUNTER — Other Ambulatory Visit: Payer: Self-pay | Admitting: Family Medicine

## 2024-05-31 DIAGNOSIS — F419 Anxiety disorder, unspecified: Secondary | ICD-10-CM

## 2024-05-31 MED ORDER — HYDROXYZINE HCL 10 MG PO TABS: 10.0000 mg | ORAL_TABLET | Freq: Three times a day (TID) | ORAL | 2 refills | Status: AC | PRN
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
## 2024-06-03 ENCOUNTER — Other Ambulatory Visit: Payer: Self-pay | Admitting: Family Medicine

## 2024-06-06 ENCOUNTER — Telehealth: Payer: Self-pay | Admitting: Family Medicine

## 2024-06-06 ENCOUNTER — Other Ambulatory Visit (INDEPENDENT_AMBULATORY_CARE_PROVIDER_SITE_OTHER)

## 2024-06-06 ENCOUNTER — Other Ambulatory Visit: Payer: Self-pay | Admitting: Family Medicine

## 2024-06-06 DIAGNOSIS — E538 Deficiency of other specified B group vitamins: Secondary | ICD-10-CM

## 2024-06-06 DIAGNOSIS — I10 Essential (primary) hypertension: Secondary | ICD-10-CM

## 2024-06-06 LAB — CBC WITH DIFFERENTIAL/PLATELET
Basophils Absolute: 0.1 K/uL (ref 0.0–0.1)
Basophils Relative: 0.8 % (ref 0.0–3.0)
Eosinophils Absolute: 0.2 K/uL (ref 0.0–0.7)
Eosinophils Relative: 2.6 % (ref 0.0–5.0)
HCT: 41.7 % (ref 36.0–46.0)
Hemoglobin: 14.2 g/dL (ref 12.0–15.0)
Lymphocytes Relative: 24.8 % (ref 12.0–46.0)
Lymphs Abs: 1.8 K/uL (ref 0.7–4.0)
MCHC: 34 g/dL (ref 30.0–36.0)
MCV: 89.8 fl (ref 78.0–100.0)
Monocytes Absolute: 0.5 K/uL (ref 0.1–1.0)
Monocytes Relative: 6.6 % (ref 3.0–12.0)
Neutro Abs: 4.7 K/uL (ref 1.4–7.7)
Neutrophils Relative %: 65.2 % (ref 43.0–77.0)
Platelets: 372 K/uL (ref 150.0–400.0)
RBC: 4.64 Mil/uL (ref 3.87–5.11)
RDW: 13.4 % (ref 11.5–15.5)
WBC: 7.2 K/uL (ref 4.0–10.5)

## 2024-06-06 LAB — VITAMIN B12: Vitamin B-12: 466 pg/mL (ref 211–911)

## 2024-06-06 MED ORDER — IRBESARTAN-HYDROCHLOROTHIAZIDE 150-12.5 MG PO TABS: 1.0000 | ORAL_TABLET | Freq: Every day | ORAL | 1 refills | Status: DC

## 2024-06-06 NOTE — Telephone Encounter (Signed)
 Copied from CRM #[id number]. Topic: Clinical - Medication Refill >> Jun 06, 2024  1:48 PM Montie POUR wrote: Medication:  irbesartan -hydrochlorothiazide  (AVALIDE) 150-12.5 MG tablet   Has the patient contacted their pharmacy? Yes (Agent: If no, request that the patient contact the pharmacy for the refill. If patient does not wish to contact the pharmacy document the reason why and proceed with request.) (Agent: If yes, when and what did the pharmacy advise?) Pharmacy needs order to refill  This is the patient's preferred pharmacy:  [facility] [address] [place], [place] - [address] [place] [address] Phone: [phone] Fax: [phone]  Is this the correct pharmacy for this prescription? Yes If no, delete pharmacy and type the correct one.   Has the prescription been filled recently? Yes  Is the patient out of the medication? Yes  Has the patient been seen for an appointment in the last year OR does the patient have an upcoming appointment? Yes  Can we respond through MyChart? No  Agent: Please be advised that Rx refills may take up to 3 business days. We ask that you follow-up with your pharmacy.

## 2024-06-06 NOTE — Telephone Encounter (Signed)
 Pt wants to know if it is ok for her to take her daily vitamin. She is not sure because she has had issues with her vitamin d  levels. She did not have the vitamins with her. Please reach out to patient.

## 2024-06-09 ENCOUNTER — Other Ambulatory Visit: Payer: Self-pay | Admitting: Family Medicine

## 2024-06-09 NOTE — Telephone Encounter (Signed)
 Copied from CRM #[id number]. Topic: Clinical - Medication Refill >> Jun 09, 2024  8:20 AM Mercedes MATSU wrote: Medication:  NP THYROID  15 MG tablet  Has the patient contacted their pharmacy? Yes (Agent: If no, request that the patient contact the pharmacy for the refill. If patient does not wish to contact the pharmacy document the reason why and proceed with request.) (Agent: If yes, when and what did the pharmacy advise?)  This is the patient's preferred pharmacy:  [facility] [address] [place], [place] - [address] [place] [address] Phone: [phone] Fax: [phone]  Is this the correct pharmacy for this prescription? Yes If no, delete pharmacy and type the correct one.   Has the prescription been filled recently? Yes  Is the patient out of the medication? Yes  Has the patient been seen for an appointment in the last year OR does the patient have an upcoming appointment? Yes  Can we respond through MyChart? Yes  Agent: Please be advised that Rx refills may take up to 3 business days. We ask that you follow-up with your pharmacy.

## 2024-06-09 NOTE — Telephone Encounter (Signed)
 Chart not reviewed, Rx sent on 06/05/24. Error CRM sent.   NP THYROID  15 MG tablet 270 tablet 0 06/05/2024 --   Sig - Route: Take 3 tablets by mouth once daily - Oral   Sent to pharmacy as: NP THYROID  15 MG tablet   E-Prescribing Status: Receipt confirmed by pharmacy (06/05/2024  9:44 AM EST)

## 2024-06-09 NOTE — Telephone Encounter (Signed)
 Gretta Lauraine HERO, RN   06/09/2024  1:57 PM  Thank you for submitting this issue for investigation. After a thorough review, we have determined that an error was made by an E2C2 team member. We have addressed the matter directly with the agent involved. We appreciate you bringing this to our attention.   Error/Investigation Details: Investigated by RN TL, RN coached on appropriate refill workflow.

## 2024-06-14 ENCOUNTER — Ambulatory Visit: Payer: Self-pay | Admitting: Family Medicine

## 2024-06-15 ENCOUNTER — Ambulatory Visit: Payer: Self-pay

## 2024-06-15 NOTE — Telephone Encounter (Signed)
3rd attempt, left message 

## 2024-06-15 NOTE — Telephone Encounter (Signed)
 FYI Only or Action Required?: FYI only for provider: homecare advised for wound.  Patient was last seen in primary care on 04/07/2024 by Antonio Meth, [name], DO.  Called Nurse Triage reporting Rash.  Symptoms began several days ago.  Interventions attempted: Nothing.  Symptoms are: gradually worsening.  Triage Disposition: Home Care  Patient/caregiver understands and will follow disposition?: Yes Reason for Disposition  1 or 2 small sores  Answer Assessment - Initial Assessment Questions Got antibiotic ointment but hasn't tried yet. Told patient to try this and warm compress and call back if doesn't see improvement. 1. APPEARANCE of SORES: What do the sores look like?     Looks like ingrown hair 2. NUMBER: How many sores are there?     1 4. LOCATION: Where are the sores located?     Inner thigh 5. ONSET: When did the sores begin?     3 days ago 6. TENDER: Does it hurt when you touch it?  (Scale 1-10; or mild, moderate, severe)      7 7. CAUSE: What do you think is causing the sores?     Lives in shelter 8. OTHER SYMPTOMS: Do you have any other symptoms? (e.g., fever, new weakness)     denies  Protocols used: Sores-A-AH
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
## 2024-06-15 NOTE — Telephone Encounter (Signed)
 2nd attempt, LVM

## 2024-06-20 ENCOUNTER — Ambulatory Visit: Admitting: Psychiatry

## 2024-06-20 NOTE — Progress Notes (Signed)
 No show

## 2024-06-21 ENCOUNTER — Telehealth: Payer: Self-pay | Admitting: Psychiatry

## 2024-06-21 ENCOUNTER — Other Ambulatory Visit: Payer: Self-pay | Admitting: Psychiatry

## 2024-06-21 DIAGNOSIS — F411 Generalized anxiety disorder: Secondary | ICD-10-CM

## 2024-06-21 DIAGNOSIS — Z8659 Personal history of other mental and behavioral disorders: Secondary | ICD-10-CM

## 2024-06-21 MED ORDER — ALPRAZOLAM 0.5 MG PO TABS: ORAL_TABLET | ORAL | 0 refills | Status: DC

## 2024-06-21 NOTE — Telephone Encounter (Signed)
 [facility] called stating need approval to fill Alprazolam . 7 days early. RTC [phone]
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
## 2024-06-21 NOTE — Telephone Encounter (Signed)
 Sent 5 day RX

## 2024-06-21 NOTE — Telephone Encounter (Signed)
 LF 11/24. Has RF available, but will need to authorize early RF if appropriate.

## 2024-06-21 NOTE — Telephone Encounter (Signed)
 Addressed via previous message.
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
## 2024-06-23 ENCOUNTER — Encounter: Payer: Self-pay | Admitting: Family Medicine

## 2024-06-23 ENCOUNTER — Ambulatory Visit: Admitting: Family Medicine

## 2024-06-23 ENCOUNTER — Ambulatory Visit: Payer: Self-pay

## 2024-06-23 VITALS — BP 143/69 | HR 90 | Temp 98.6°F | Resp 16 | Ht 66.0 in | Wt 206.0 lb

## 2024-06-23 DIAGNOSIS — M791 Myalgia, unspecified site: Secondary | ICD-10-CM

## 2024-06-23 DIAGNOSIS — G4730 Sleep apnea, unspecified: Secondary | ICD-10-CM | POA: Diagnosis not present

## 2024-06-23 DIAGNOSIS — J029 Acute pharyngitis, unspecified: Secondary | ICD-10-CM | POA: Diagnosis not present

## 2024-06-23 LAB — POCT RAPID STREP A (OFFICE): Rapid Strep A Screen: NEGATIVE

## 2024-06-23 LAB — POC COVID19 BINAXNOW: SARS Coronavirus 2 Ag: NEGATIVE

## 2024-06-23 MED ORDER — PROMETHAZINE-DM 6.25-15 MG/5ML PO SYRP: 5.0000 mL | ORAL_SOLUTION | Freq: Four times a day (QID) | ORAL | 0 refills | Status: AC | PRN

## 2024-06-23 MED ORDER — AZITHROMYCIN 250 MG PO TABS: ORAL_TABLET | ORAL | 0 refills | Status: AC

## 2024-06-23 NOTE — Telephone Encounter (Signed)
 FYI Only or Action Required?: FYI only for provider: pt is already scheduled for today. She is outside sleeping in her car as she is early to the appt. .  Patient was last seen in primary care on 04/07/2024 by Antonio Meth, [name], DO.  Called Nurse Triage reporting Fatigue.  Symptoms began several weeks ago.  Interventions attempted: Nothing.  Symptoms are: unchanged.  Triage Disposition: See PCP When Office is Open (Within 3 Days)  Patient/caregiver understands and will follow disposition?: Yes  Copied from CRM #[id number]. Topic: Clinical - Red Word Triage >> Jun 23, 2024  9:37 AM China J wrote: [facility] that prompted transfer to Nurse Triage: The patient is having extreme fatigue and stated that she almost passed out while doing regular tasks because of how tired she's been. She also said that there are times when people would try shaking her out of it but she won't respond. Reason for Disposition  [1] Fatigue (i.e., tires easily, decreased energy) AND [2] persists > 1 week  Answer Assessment - Initial Assessment Questions Pt believes she is having sleep apnea, she lives at a shelter and they have told her she stops breathing at periods through the night. She denies any shortness of breath, chest pain or dizziness. She states she is just exceptionally tired throughout the day. She also began to have a cough about 5 days ago. Non productive, no fever, she does have sore throat and nasal congestion.  As RN is pulling up her appts, see there is one scheduled for today. Pt is aware, she is just calling to see if she could be seen sooner because she is currently sitting in the parking lot. Rn advised no open slots as of right now.     1. DESCRIPTION: Describe how you are feeling.     Very tired 2. SEVERITY: How bad is it?  Can you stand and walk?     yes 3. ONSET: When did these symptoms begin? (e.g., hours, days, weeks, months)     unknown 4. CAUSE: What do you think is  causing the weakness or fatigue? (e.g., not drinking enough fluids, medical problem, trouble sleeping)     Thinks sleep apnea 5. OTHER SYMPTOMS: Do you have any other symptoms? (e.g., chest pain, fever, cough, SOB, vomiting, diarrhea, bleeding, other areas of pain)     Currently has cough, sore throat, nasal congestion.  Answer Assessment - Initial Assessment Questions 1. ONSET: When did the cough begin?      5 days 2. SEVERITY: How bad is the cough today?      Mild/mod 3. SPUTUM: Describe the color of your sputum (e.g., none, dry cough; clear, white, yellow, green)     none 4. HEMOPTYSIS: Are you coughing up any blood? If Yes, ask: How much? (e.g., flecks, streaks, tablespoons, etc.)     none 5. DIFFICULTY BREATHING: Are you having difficulty breathing? If Yes, ask: How bad is it? (e.g., mild, moderate, severe)      denies 6. FEVER: Do you have a fever? If Yes, ask: What is your temperature, how was it measured, and when did it start?     denies 7. OTHER SYMPTOMS: Do you have any other symptoms? (e.g., runny nose, wheezing, chest pain)       Sore throat, nasal congestion  Protocols used: Weakness (Generalized) and Fatigue-A-AH, Cough - Acute Non-Productive-A-AH
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
## 2024-06-23 NOTE — Assessment & Plan Note (Signed)
 Neg strep and covid  Z pak

## 2024-06-25 DIAGNOSIS — R051 Acute cough: Secondary | ICD-10-CM | POA: Diagnosis not present

## 2024-06-26 ENCOUNTER — Telehealth: Payer: Self-pay

## 2024-06-26 NOTE — Telephone Encounter (Signed)
 Pt seen at ED

## 2024-06-26 NOTE — Telephone Encounter (Signed)
 Copied from CRM [id number]. Topic: Referral - Question >> Jun 24, 2023  2:13 PM Pinkey ORN wrote: Patient needs a referral to Dr. Tonuzi, Lirim in regards to brain disorder and learning disability. >> Jun 24, 2023  2:15 PM Pinkey ORN wrote: Patient needs a referral to Dr. Tonuzi, Lirim in regards to brain disorder and learning disability.

## 2024-06-26 NOTE — Telephone Encounter (Signed)
 Initial Comment Caller states she was prescribed a medication 2 days ago but it is not working . she is still having sleep apnea , she is feeling really tired Translation No Nurse Assessment Nurse: Clois, RN, Almarie Date/Time (Eastern Time): 06/25/2024 1:33:19 PM Confirm and document reason for call. If symptomatic, describe symptoms. ---Caller states that she was prescribed medication 2 days ago for sleep apnea. States is not helping. States still having sleep apnea. States is very tired. States that the medication is azithromycin . States was just for precaution. States cough for 6 days that is none productive. Denies fever. Denies known exposure to anything specific. Does the patient have any new or worsening symptoms? ---Yes Will a triage be completed? ---Yes Related visit to physician within the last 2 weeks? ---Yes Does the PT have any chronic conditions? (i.e. diabetes, asthma, this includes High risk factors for pregnancy, etc.) ---Yes List chronic conditions. ---Hashimoto's Is this a behavioral health or substance abuse call? ---No Guidelines Guideline Title Affirmed Question Affirmed Notes Nurse Date/Time (Eastern Time) Infection on Antibiotic Follow-up Call Patient sounds very sick or weak to the triager Clois, RN, Almarie 06/25/2024 1:36:36 PM PLEASE NOTE: All timestamps contained within this report are represented as Eastern Standard Time. CONFIDENTIALTY NOTICE: This fax transmission is intended only for the addressee. It contains information that is legally privileged, confidential or otherwise protected from use or disclosure. If you are not the intended recipient, you are strictly prohibited from reviewing, disclosing, copying using or disseminating any of this information or taking any action in reliance on or regarding this information. If you have received this fax in error, please notify us  immediately by telephone so that we can arrange for its  return to us . Phone: [phone], Toll-Free: [phone], Fax: [phone] [email or web address] 07-30-1965 Page: 1 of2 CallId: [id number] Disp. Time Titus Time) Disposition Final User 06/25/2024 1:20:15 PM Attempt made - no message left Cantrell, RN, Almarie 06/25/2024 1:38:50 PM Go to ED/UCC Now (or PCP triage) Yes Cantrell, RN, Almarie Final Disposition 06/25/2024 1:38:50 PM Go to ED/UCC Now (or PCP triage) Yes Cantrell, RN, Almarie [place] Disagree/Comply Comply Caller Understands Yes PreDisposition Go to ED Care Advice Given Per Guideline GO TO ED/UCC NOW (OR PCP TRIAGE): * IF NO PCP (PRIMARY CARE PROVIDER) SECOND-LEVEL TRIAGE: You need to be seen within the next hour. Go to the ED/UCC at _____________ Hospital. Leave as soon as you can. CAUTION: See Sources of Care below when considering where to send the patient. CARE ADVICE given per Infection on Antibiotic Follow-up Call (Adult) guideline. BRING MEDICINES: * Bring a list of your current medicines when you go to the Emergency Department (ER). * Bring the pill bottles too. This will help the doctor (or NP/PA) to make certain you are taking the right medicines and the right dose. Comments User: Almarie Clois, RN Date/Time Titus Time): 06/25/2024 1:19:58 PM Someone appeared to answer phone. Could hear people talking in the background. No one was responding. User: Almarie Clois, RN Date/Time Titus Time): 06/25/2024 1:39:40 PM Caller states that she is not worse/breathing is not worse, but her sleep apnea is worse. Believe she needs to be seen. Referrals [facility] - ED

## 2024-06-27 ENCOUNTER — Encounter: Payer: Self-pay | Admitting: Psychiatry

## 2024-06-27 ENCOUNTER — Telehealth: Payer: Self-pay

## 2024-06-27 ENCOUNTER — Ambulatory Visit (INDEPENDENT_AMBULATORY_CARE_PROVIDER_SITE_OTHER): Admitting: Psychiatry

## 2024-06-27 DIAGNOSIS — Z638 Other specified problems related to primary support group: Secondary | ICD-10-CM | POA: Diagnosis not present

## 2024-06-27 DIAGNOSIS — F4001 Agoraphobia with panic disorder: Secondary | ICD-10-CM

## 2024-06-27 DIAGNOSIS — F411 Generalized anxiety disorder: Secondary | ICD-10-CM

## 2024-06-27 DIAGNOSIS — Z5900 Homelessness unspecified: Secondary | ICD-10-CM | POA: Diagnosis not present

## 2024-06-27 DIAGNOSIS — F900 Attention-deficit hyperactivity disorder, predominantly inattentive type: Secondary | ICD-10-CM

## 2024-06-27 DIAGNOSIS — Z8659 Personal history of other mental and behavioral disorders: Secondary | ICD-10-CM | POA: Diagnosis not present

## 2024-06-27 MED ORDER — THYROID 15 MG PO TABS: 45.0000 mg | ORAL_TABLET | Freq: Every day | ORAL | 0 refills | Status: DC

## 2024-06-27 MED ORDER — ALPRAZOLAM 0.5 MG PO TABS: ORAL_TABLET | ORAL | 2 refills | Status: DC

## 2024-06-27 MED ORDER — ATOMOXETINE HCL 25 MG PO CAPS: ORAL_CAPSULE | ORAL | 0 refills | Status: AC

## 2024-06-27 NOTE — Patient Instructions (Signed)
 The most common therapeutic range of Saffron used in research is 20-30 mg per day
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
## 2024-07-02 NOTE — ED Provider Notes (Signed)
 [facility] Emergency Department Emergency Department Provider Note  This document was created using the aid of voice recognition Dragon dictation software.   Provider at bedside: 07/04/2024 8:53 AM   History obtained from the: Patient  History   Chief Complaint  Patient presents with   Cough    HPI  Jenna Arroyo is a 58 y.o. female who presents to the ED with complaints of cough.  Patient states she has been dealing with a cough over a week however she is currently on antibiotics and taking cough medication and reports cough is well manage and is not worsening.  She also endorses fatigue for the last 5 months after being diagnosed with sleep apnea and states her sleep has been poor.  Her primary care provider is currently working on obtaining a machine for her sleep apnea.  She also states she is currently in a shelter and has been getting poor sleep for the last 5 months due to surrounding environment making it difficult for her to sleep. Patient also is frustrated with the rules of the shelter and states that shelter has been taking a toll on her physical and mental health however she is currently working on getting her own place.  Denies any suicidal homicidal ideation.  She also mention having an single episode of vomiting and diarrhea yesterday after consuming food at the shelter that did not taste well.  Denies any further episodes since.  She also reports that she is frustrated with the facility rules on not allow her to bring electrolyte supplements inside and states she feels much better and well-hydrated when she is consuming electrolytes.  Patient also mention having a brief episode of shortness of breath after arriving to the emergency department however she states it only lasted a few seconds and has not returned.  She currently denies any fever, chest pain, shortness breath, nausea,  abdominal pain, rash, headache, dizziness, neck sickness,  rash. ______________________ ROS: Pertinent positives and negatives per HPI. Pertinent past medical, surgical, social and family history records were reviewed. Current Medications and Allergies were reviewed.  Physical Exam   Vitals:   07/02/24 0730 07/02/24 0731 07/02/24 1007  BP:  (!) 161/94 (!) 156/92  BP Location:  Left arm Left arm  Patient Position:   Sitting  Pulse:  88 88  Resp:  18 16  Temp:  97.7 F (36.5 C)   TempSrc:  Oral   SpO2:  98% 100%  Weight: 93.5 kg (206 lb 3.2 oz)    Height:  167.6 cm (5' 6)     Physical Exam Vitals reviewed.  Constitutional:      General: She is not in acute distress.    Appearance: Normal appearance. She is not ill-appearing.  HENT:     Head: Normocephalic and atraumatic.     Nose: Nose normal.     Mouth/Throat:     Mouth: Mucous membranes are moist.  Eyes:     Conjunctiva/sclera: Conjunctivae normal.  Cardiovascular:     Rate and Rhythm: Normal rate and regular rhythm.     Pulses: Normal pulses.     Heart sounds: Normal heart sounds.  Pulmonary:     Effort: Pulmonary effort is normal.     Breath sounds: Normal breath sounds.  Abdominal:     General: Abdomen is flat. There is no distension.     Palpations: Abdomen is soft.     Tenderness: There is no abdominal tenderness.  Musculoskeletal:  General: Normal range of motion.     Cervical back: Normal range of motion.  Neurological:     Mental Status: She is alert. Mental status is at baseline.     Results  EKG Impression:  (Interpreted by me)  ED Course     Clinical Complexity  Patient's presentation is most consistent with acute, uncomplicated illness.  Patient's Impaired access to primary care increases the complexity of managing their  presentation with cough.    Provider time spent in patient care today, inclusive of but not limited to clinical reassessment, review of diagnostic studies, and discharge preparation, was less than 30 minutes.    Procedure Note  Procedures  Medical Decision Making  Medical Decision Making 58 year old female presents to the ED with complaints of cough and chronic fatigue.  Upon initial evaluation patient is stable, nontoxic and in no acute distress.  Vital signs were significant for elevated blood pressure but no fever or tachycardia.  Physical exam was unremarkable. On exam, patient appears overall well and is in no acute distress. Mucous membranes are moist and patient does not appear dehydrated. Oropharynx is clear and there is no tonsillar enlargement, erythema or exudate to suggest acute strep infection. Heart sounds are normal. Lungs are clear to auscultation bilaterally with no increased work of breathing. Abdomen is soft and non tender.  Patient had a recent x-ray several days ago on her initial visit which showed no signs of pneumonia or acute finding.  Given her reassuring vital signs and physical exam I have low suspicion for pneumonia and do not need to repeat x-rays at this time.   I also believe patient not require emergent workup with labs given her overall well-appearing, unremarkable physical exam, and being hemodynamically stable.  I believe patient's chronic fatigue is due to mental stress from her current living situation, sleep apnea, and being unable to sleep secondary to environment.  Patient episode of vomiting and diarrhea was likely due to possible food poisoning from food consumed prior to episodes.  She has had no further episode, showed no signs of dehydration, and does not require rehydration or further evaluation.  Patient was also evaluated by social worker at prior visit and was provided with documentation on other housing opportunities and resources.  At this time she is stable and appropriate to be discharged with outpatient symptom management.  Advised patient to seek her primary care provider regarding obtaining sleep apnea machine.  Also encourage patient to use resources  that were given to her by social worker at prior visit to seek other housing abilities.  Return precautions were advised.  Patient understands and agree with the plan.   Problems Addressed: Acute cough: acute illness or injury Chronic fatigue: acute illness or injury    Brief Overview Jenna Arroyo is a 58 y.o. female who presents with complaint as per above.  I have reviewed the nursing documentation for past medical history, family history, and social history and agree.  Initial Differential Diagnoses: The differential include DDX: viral URI, bacterial URI, bronchitis, sinusitis, seasonal allergies, pharyngitis, influenza, COVID-19.  Therapies: These medications and interventions were provided for the patient while in the ED.  See the EMR for full details regarding lab and imaging results. Discrepancies: I reviewed the available nursing and EMS notes. There were the following discrepancies which I have clarified with the patient: None  Other: Other Documentation: I reviewed the patient's past medical history, including notes from our facility and what is available in CareEverywhere.  Consideration for hospitalization: yes  Final Differential: The patient's presentation is most consistent with cough.   Post-ED Care: I believe that the patient is safe for discharge home with outpatient followup. We discussed following up PCP. I provided thorough ED return precautions. The patient feels safe and comfortable with this plan.  Discussed findings, test results and plan of care with patient. Patient in agreement to plan of care. All questions were answered to the patient's satisfaction. Patient will be discharged with follow up to PCP or return to the ED if symptoms persist, worsen or change.  ED Clinical Impression  Clinical picture was discussed with patient along with risks and benefits of management options, shared decision making will discharge the patient home with close  outpatient follow-up for continued management.  1. Acute cough   2. Chronic fatigue    FOLLOW [address] [place], [place] [address] [place] [address] [phone]      ED Disposition     ED Disposition  Discharge   Condition  Stable   Comment  --        _____________________________

## 2024-07-04 ENCOUNTER — Ambulatory Visit: Payer: Self-pay

## 2024-07-04 ENCOUNTER — Ambulatory Visit: Payer: Self-pay | Admitting: Family Medicine

## 2024-07-04 NOTE — Telephone Encounter (Signed)
 First attempt to reach patient, left message.   Message from Flanders S sent at 07/04/2024 10:35 AM EST  Reason for Triage: patient is calling due to ongoing sore throat and would like a fill for atomoxetine  (STRATTERA ) 25 MG capsule.

## 2024-07-04 NOTE — Telephone Encounter (Signed)
 FYI Only or Action Required?: Action required by provider: request for appointment, clinical question for provider, and update on patient condition.  Patient was last seen in primary care on 06/23/2024 by Antonio Meth, [name], DO.  Called Nurse Triage reporting Cough.  Symptoms began prior to ER visit on 06/25/2024.  Interventions attempted: Rest, hydration, or home remedies.  Symptoms are: gradually worsening.  Triage Disposition: See Physician Within 24 Hours  Patient/caregiver understands and will follow disposition?: No, wishes to speak with PCP                  Copied from CRM #[id number]. Topic: Clinical - Red Word Triage >> Jul 04, 2024  3:37 PM Eva FALCON wrote: Red Word that prompted transfer to Nurse Triage: went to ED for cough, sore throat, fatigue, difficulty breathing especially out in the cold, also has ear ache is not better. Reason for Disposition  [1] Continuous (nonstop) coughing interferes with work or school AND [2] no improvement using cough treatment per Care Advice  Answer Assessment - Initial Assessment Questions Patient went to the ER on 06/25/2024 and 07/02/2024 for symptoms Patient states she is still coughing, has a sore throat, and right ear is still hurting Pale yellow mucous when she coughs it up Patient denies chest pain, difficulty breathing, known fever, coughing up blood Patient states she is at a [employer] and she can't have over the counter medications or a thermometer to check her temperature There are no available appointments at the patient's PCP office within the next 24 hours or within the surrounding offices within the region.  Patient is advised Urgent Care at this time.  Patient states that she cannot go to Urgent Care due to financial reasons. Patient wants to also know about if a sleep apnea test has been scheduled for her yet.  She wants to get an appointment to see her PCP as soon as possible. Patient is advised to  call us  back if anything changes or with any further questions/concerns. Patient is advised that if anything worsens to go to the Emergency Room. Patient verbalized understanding.  Protocols used: Cough - Acute Productive-A-AH
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
## 2024-07-05 ENCOUNTER — Ambulatory Visit: Payer: Self-pay

## 2024-07-05 ENCOUNTER — Telehealth: Payer: Self-pay

## 2024-07-05 ENCOUNTER — Other Ambulatory Visit: Payer: Self-pay | Admitting: Psychiatry

## 2024-07-05 DIAGNOSIS — Z8659 Personal history of other mental and behavioral disorders: Secondary | ICD-10-CM

## 2024-07-05 DIAGNOSIS — F411 Generalized anxiety disorder: Secondary | ICD-10-CM

## 2024-07-05 MED ORDER — ALPRAZOLAM 0.5 MG PO TABS: ORAL_TABLET | ORAL | 1 refills | Status: AC

## 2024-07-05 NOTE — Telephone Encounter (Addendum)
 Pt reporting that again her Xanax  has been lost or taken from her and is asking for a RF. Reports no way to secure medication in the shelter.  Told pt that you might only Rx a week at a time due to inability to secure medication.   Comer from [facility] said you would need to call for an early RF and this would be the last one. She does have RF available.   07/02/2024 06/27/2024 1  Alprazolam  0.5 Mg Tablet 120.00 30 Ca Cot [id number] [facility] (7344) 0/2 4.00 LME Medicaid [place] 06/22/2024 06/21/2024 1  Alprazolam  0.5 Mg Tablet 20.00 5 Ca Cot [id number] [facility] (7344) 0/0 4.00 LME Private Pay [place] 05/29/2024 04/19/2024 1  Alprazolam  0.5 Mg Tablet 120.00 30 Ca Cot

## 2024-07-05 NOTE — Telephone Encounter (Signed)
 FYI Only or Action Required?: Action required by provider: clinical question for provider and update on patient condition.  Patient was last seen in primary care on 06/23/2024 by Jenna Arroyo, Jenna SAUNDERS, DO.  Called Nurse Triage reporting Insomnia.  Symptoms began several months ago.  Interventions attempted: Rest, hydration, or home remedies.  Symptoms are: unchanged.  Triage Disposition: See PCP Within 2 Weeks  Patient/caregiver understands and will follow disposition?: No, wishes to speak with PCP   Copied from CRM #[id number]. Topic: Clinical - Red Word Triage >> Jul 05, 2024  2:41 PM Jayma L wrote: Red Word that prompted transfer to Nurse Triage: struggle to sleep . Extreme tiredness Reason for Disposition  [1] Insomnia lasts > 2 weeks AND [2] no improvement after using Care Advice  Answer Assessment - Initial Assessment Questions Patient reports struggling to sleep and extreme tiredness.   1. DESCRIPTION: Tell me about your sleeping problem. (e.g., waking frequently during night, sleeping during day and awake at night, trouble falling asleep) How bad is it?      Waking up not feeling like she has slept well 2. ONSET: How long have you been having trouble sleeping? (e.g., days, weeks, months; longstanding sleep problems)     4-5 months 3. DAYTIME SLEEP PATTERN: How much time do you spend sleeping or napping during the day?     NA 4. STRESSORS: Is there anything that is making you feel stressed? Is there something that worries you?     Family stressors 5. PAIN: Do you have any pain that is keeping you awake? (e.g., back pain, joint pain) If Yes, ask How bad is the pain? (e.g., scale 0-10; mild, moderate, severe).     no 6. CAFFEINE: Do you drink caffeinated beverages? If Yes, ask How much each day? (e.g., coffee, tea, colas)     no 7. ALCOHOL USE OR SUBSTANCE USE: Do you drink alcohol or use any substances?     no 8. MEDICINE CHANGE: Has there been any  recent change in medicines? (e.g., new medicine started, stopped, or dose changed).      No medication changes 9. TREATMENT: What have you done so far to treat this sleep problem? (e.g., prescription or OTC sleep medicines, herbal or dietary supplements, cannabis, relaxation strategies)     medication 10. OTHER SYMPTOMS: Do you have any other symptoms?  (e.g., difficulty breathing)       Getting over bronchitis  Protocols used: Insomnia-A-AH

## 2024-07-05 NOTE — Telephone Encounter (Signed)
 Cancelled other RF and sent 1 week supply with 1 RF until stability reestablished.

## 2024-07-05 NOTE — Telephone Encounter (Signed)
 See NT note on yesterday  Message from Brittany M sent at 07/05/2024 10:27 AM EST  Reason for Triage: Patient is still not feeling well- coughing is not letting up. Wanting to know if another medication can be called in to help.

## 2024-07-07 ENCOUNTER — Ambulatory Visit: Payer: Self-pay

## 2024-07-07 ENCOUNTER — Telehealth: Payer: Self-pay | Admitting: Psychiatry

## 2024-07-07 NOTE — Telephone Encounter (Signed)
 FYI Only or Action Required?: Action required by provider: update on patient condition and please advise on abx for sinus/ear infection .  Patient was last seen in primary care on 06/23/2024 by Antonio Meth, [name], DO.  Called Nurse Triage reporting Cough and Otalgia.  Symptoms began several weeks ago.  Interventions attempted: Prescription medications: cough  and Rest, hydration, or home remedies.  Symptoms are: gradually worsening.  Triage Disposition: See Physician Within 24 Hours  Patient/caregiver understands and will follow disposition?: No, wishes to speak with PCP  Reason for Triage: pt called because she hasn't received antibiotics for lingering cough. She would like to be prescribed meds that can help with her cold. Please call back soon as possible with recommendations.   Reason for Disposition  [1] Continuous (nonstop) coughing interferes with work or school AND [2] no improvement using cough treatment per Care Advice  Answer Assessment - Initial Assessment Questions Pt with lingering cough for the last 3 weeks at least-- ED visit and given cough syrup. Sinus congestion has progressed to Right ear ache. Denies drainage or fever. Ache is not severe but uncomfortable. Mild headache. Nausea with occasional vomiting- (contributing at least some to the food at the shelter)  Sleep apnea- hasn't heard back from a date to get assessed. Lives in shelter and everyone can here her snore   Patient asking if medication can be sent into the pharmacy for her. She understands that an appt would be advised and one made for 1/5. Advised UC for sooner eval as unable to get into her MyChart account right now. Ed precautions understood  1. ONSET: When did the cough begin?      2-3 weeks  2. SEVERITY: How bad is the cough today?      Coughing so hard she is throwing up even with cough syrup  3. SPUTUM: Describe the color of your sputum (e.g., none, dry cough; clear, white, yellow,  green)     Light green yellow 4. HEMOPTYSIS: Are you coughing up any blood? If Yes, ask: How much? (e.g., flecks, streaks, tablespoons, etc.)     Denies  5. DIFFICULTY BREATHING: Are you having difficulty breathing? If Yes, ask: How bad is it? (e.g., mild, moderate, severe)      Denies  6. FEVER: Do you have a fever? If Yes, ask: What is your temperature, how was it measured, and when did it start?     denies 7. CARDIAC HISTORY: Do you have any history of heart disease? (e.g., heart attack, congestive heart failure)      HTN  8. LUNG HISTORY: Do you have any history of lung disease?  (e.g., pulmonary embolus, asthma, emphysema)     Denies  9. PE RISK FACTORS: Do you have a history of blood clots? (or: recent major surgery, recent prolonged travel, bedridden)     Denies  10. OTHER SYMPTOMS: Do you have any other symptoms? (e.g., runny nose, wheezing, chest pain)       Earache  Protocols used: Cough - Acute Productive-A-AH

## 2024-07-07 NOTE — Telephone Encounter (Signed)
 First attempt to contact patient for triage. LVM for patient to return call to [phone]   Reason for Triage: pt called because she hasn't received antibiotics for lingering cough. She would like to be prescribed meds that can help with her cold. Please call back soon as possible with recommendations.

## 2024-07-07 NOTE — Telephone Encounter (Signed)
 Pt LVM @ 12/31@ 5:53p that she found her Alprazolam .

## 2024-07-10 ENCOUNTER — Ambulatory Visit: Admitting: Family Medicine

## 2024-07-10 NOTE — Telephone Encounter (Signed)
 Patient/caregiver understands and will follow disposition?: No, wishes to speak with PCP   Reason for Triage: pt called because she hasn't received antibiotics for lingering cough. She would like to be prescribed meds that can help with her cold. Please call back soon as possible with recommendations.

## 2024-07-10 NOTE — Telephone Encounter (Signed)
 Left message on machine to call back

## 2024-07-11 NOTE — Telephone Encounter (Signed)
Left detailed message on machine to call back to schedule appointment.

## 2024-07-11 NOTE — Progress Notes (Deleted)
 No chief complaint on file.   Jenna Arroyo Grumbles here for URI complaints.   She has has lingering cough.  Smoking Hx?  She was given Azithromycin  on 06/23/2024.  Duration: {Numbers; 1-10:13787} {Time; day/wk/mo/yr:20843}  Associated symptoms: {URI Symptoms :210800001} Denies: {URI Symptoms :210800001} Treatment to date: *** Sick contacts: {yes/no:20286}  Past Medical History:  Diagnosis Date   Anxiety    High cholesterol    Hypertension    Lyme disease    Thyroid  disease     Objective There were no vitals taken for this visit. General: Awake, alert, appears stated age HEENT: AT, [place], ears patent b/l and TM's neg, nares patent w/o discharge, pharynx pink and without exudates, MMM Neck: No masses or asymmetry Heart: RRR Lungs: CTAB, no accessory muscle use Psych: Age appropriate judgment and insight, normal mood and affect  No diagnosis found.  Continue to push fluids, practice good hand hygiene, cover mouth when coughing. F/u prn. If starting to experience fevers, shaking, or shortness of breath, seek immediate care. Pt voiced understanding and agreement to the plan.  Harlene LITTIE Jolly, DNP, AGNP-C 07/11/2024 11:16 AM

## 2024-07-12 ENCOUNTER — Inpatient Hospital Stay: Admitting: Student

## 2024-07-14 ENCOUNTER — Ambulatory Visit: Admitting: Student

## 2024-07-14 ENCOUNTER — Encounter: Payer: Self-pay | Admitting: Internal Medicine

## 2024-07-14 ENCOUNTER — Ambulatory Visit: Admitting: Internal Medicine

## 2024-07-14 ENCOUNTER — Ambulatory Visit: Payer: Self-pay

## 2024-07-14 VITALS — BP 136/86 | HR 79 | Temp 97.8°F | Resp 16 | Ht 66.0 in | Wt 207.0 lb

## 2024-07-14 DIAGNOSIS — E538 Deficiency of other specified B group vitamins: Secondary | ICD-10-CM

## 2024-07-14 DIAGNOSIS — R052 Subacute cough: Secondary | ICD-10-CM | POA: Diagnosis not present

## 2024-07-14 MED ORDER — FLUTICASONE PROPIONATE 50 MCG/ACT NA SUSP: 2.0000 | Freq: Every day | NASAL | 6 refills | Status: AC

## 2024-07-14 MED ORDER — CYANOCOBALAMIN 1000 MCG/ML IJ SOLN
1000.0000 ug | Freq: Once | INTRAMUSCULAR | Status: AC
  Administered 2024-07-14: 1000 ug via INTRAMUSCULAR

## 2024-07-14 MED ORDER — AZELASTINE HCL 0.1 % NA SOLN: 2.0000 | Freq: Two times a day (BID) | NASAL | 6 refills | Status: AC

## 2024-07-14 MED ORDER — BENZONATATE 200 MG PO CAPS: 200.0000 mg | ORAL_CAPSULE | Freq: Three times a day (TID) | ORAL | 0 refills | Status: AC | PRN

## 2024-07-14 NOTE — Patient Instructions (Addendum)
 Recommend the following. Get  DELSYM over-the-counter, use it as needed for cough Tessalon  Perles 3 times a day as needed for cough Consistent use of nasal sprays: - Flonase  2 sprays on each side of the nose every day - Astepro : 2 sprays on each side of the nose twice daily I anticipate you will gradually improve in the next 10 to 14 days.

## 2024-07-14 NOTE — Progress Notes (Unsigned)
 "  Subjective:    Patient ID: Jenna Arroyo, female    DOB: [birth year], 59 y.o.   MRN: [id number]  DOS:  07/14/2024 Acute Here for evaluation of ongoing cough. Extensive chart review: PCP visit 06/23/2024: Had respiratory symptoms, strep and COVID test negative, prescribed Z-Pak.  ER visit 06/25/2024: Presented with cough for 5 days,Chest x-ray negative, clinically stable, lungs clear, released home  ER visit 07/02/2024: Exam was benign, rec to take Tylenol , hydration, continue antibiotics  She is here because she continue with cough with occasional sputum production.  No hemoptysis. She also have some nasal discharge described as pale green or yellow in color. Denies fever or chills. No wheezing. No history of asthma or tobacco abuse.  Review of Systems See above   Past Medical History:  Diagnosis Date   Anxiety    High cholesterol    Hypertension    Lyme disease    Thyroid  disease     Past Surgical History:  Procedure Laterality Date   ABDOMINAL EXPLORATION SURGERY     APPENDECTOMY     MASTOIDECTOMY     TONSILECTOMY/ADENOIDECTOMY WITH MYRINGOTOMY      Current Outpatient Medications  Medication Instructions   albuterol  (VENTOLIN  HFA) 108 (90 Base) MCG/ACT inhaler 2 puffs, Inhalation, Every 6 hours PRN   ALPRAZolam  (XANAX ) 0.5 MG tablet TAKE ONE TABLET EVERY MORNING, TAKE ONE TABLET DAILY AT NOON, TAKE ONE TABLET EVERY EVENING AND TAKE ONE TABLET DAILY AT BEDTIME   atenolol  (TENORMIN ) 25 mg, Oral, 2 times daily   atomoxetine  (STRATTERA ) 25 MG capsule 1 capsule daily for 1 week, then 2 capsules daily for 1 week, then 3 capsules daily   Cholecalciferol (VITAMIN D ) 10 MCG/ML LIQD Take by mouth.   ciclopirox  (PENLAC ) 8 % solution Topical, Daily at bedtime, Apply over nail and surrounding skin. Apply daily over previous coat. After seven (7) days, may remove with alcohol and continue cycle.   DULoxetine  (CYMBALTA ) 40 mg, Oral, Daily   EPINEPHrine  (EPIPEN  2-PAK) 0.3 mg,  Intramuscular, As needed   fluticasone  (FLONASE ) 50 MCG/ACT nasal spray 2 sprays, Each Nare, Daily   hydrOXYzine  (ATARAX ) 10-20 mg, Oral, Every 8 hours PRN   irbesartan -hydrochlorothiazide  (AVALIDE) 150-12.5 MG tablet 1 tablet, Oral, Daily   nystatin  cream (MYCOSTATIN ) 1 Application, Topical, 2 times daily   ofloxacin  (OCUFLOX ) 0.3 % ophthalmic solution 1 drop, Right Eye, Every hour while awake   promethazine -dextromethorphan (PROMETHAZINE -DM) 6.25-15 MG/5ML syrup 5 mLs, Oral, 4 times daily PRN   silver  sulfADIAZINE  (SILVADENE ) 1 % cream 1 application , Topical, Daily   thyroid  (NP THYROID ) 45 mg, Oral, Daily   Vitamin D  (Ergocalciferol ) (DRISDOL ) 50,000 Units, Oral, Weekly       Objective:   Physical Exam BP 136/86   Pulse 79   Temp 97.8 F (36.6 C) (Oral)   Resp 16   Ht 5' 6 (1.676 m)   Wt 207 lb (93.9 kg)   SpO2 99%   BMI 33.41 kg/m  General:   Well developed, NAD, BMI noted. HEENT:  Normocephalic . Face symmetric, atraumatic. Nose slightly congested.  Throat symmetric and not red. Lungs:  CTA B Normal respiratory effort, no intercostal retractions, no accessory muscle use. Heart: RRR,  no murmur.  Lower extremities: no pretibial edema bilaterally  Skin: Not pale. Not jaundice Neurologic:  alert & oriented X3.  Speech normal, gait appropriate for age and unassisted Psych--  Cognition and judgment appear intact.  Cooperative with normal attention span and concentration.  Behavior appropriate. No  anxious or depressed appearing.      Assessment   59 year old female, PMH includes thyroid  disease, HTN, high cholesterol, anxiety-sees psychiatry, presents with  Cough, sinus congestion: Persistent cough and sinus congestion since approximately 06/25/2024, status post a Z-Pak, had a chest x-ray -- negative. Exam today is benign. Albuterol  is on her medication list but she is not taking it and denies any history of asthma, no wheezing on exam. She is unable to take  Robitussin, pseudoephedrine, ceftriaxone or prednisone. Plan: Delsym, Flonase , Astepro , Tessalon  Perles. Advised patient after upper respiratory infection cough may be persistent but anticipate she should be better in 10 to 14 days. B12 deficiency: Reports has a history of deficiency requested shot.  Done.    "

## 2024-07-14 NOTE — Telephone Encounter (Signed)
" °  FYI Only or Action Required?: Action required by provider: request for appointment.  Patient was last seen in primary care on 06/23/2024 by Antonio Meth, [name], DO.  Called Nurse Triage reporting Facial Pain.  Symptoms began several days ago.  Interventions attempted: Rest, hydration, or home remedies.  Symptoms are: gradually worsening. Sinus pain, pressure. Green-yellow mucus. Ear pain.  Triage Disposition: See Physician Within 24 Hours  Patient/caregiver understands and will follow disposition?: Yes     Copied from CRM #[id number]. Topic: Clinical - Red Word Triage >> Jul 14, 2024  9:33 AM Ivette P wrote: Red Word that prompted transfer to Nurse Triage: sinus infection from Bronchitis   not able to get here   runny nose, alot of mucus - pale greenish yellow.    some phlegm Reason for Disposition  Earache  Answer Assessment - Initial Assessment Questions 1. LOCATION: Where does it hurt?      Under her eyes 2. ONSET: When did the sinus pain start?  (e.g., hours, days)      4 days 3. SEVERITY: How bad is the pain?   (Scale 0-10; or none, mild, moderate or severe)     4-5 4. RECURRENT SYMPTOM: Have you ever had sinus problems before? If Yes, ask: When was the last time? and What happened that time?      yes 5. NASAL CONGESTION: Is the nose blocked? If Yes, ask: Can you open it or must you breathe through your mouth?     no 6. NASAL DISCHARGE: Do you have discharge from your nose? If so ask, What color?     Green-yellow 7. FEVER: Do you have a fever? If Yes, ask: What is it, how was it measured, and when did it start?      no 8. OTHER SYMPTOMS: Do you have any other symptoms? (e.g., sore throat, cough, earache, difficulty breathing)     Ear pain 9. PREGNANCY: Is there any chance you are pregnant? When was your last menstrual period?     no  Protocols used: Sinus Pain or Congestion-A-AH  "

## 2024-07-25 ENCOUNTER — Telehealth: Payer: Self-pay | Admitting: Psychiatry

## 2024-07-25 ENCOUNTER — Ambulatory Visit: Admitting: Psychiatry

## 2024-07-25 NOTE — Telephone Encounter (Signed)
 LVM to [facility]
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
## 2024-07-26 ENCOUNTER — Other Ambulatory Visit: Payer: Self-pay | Admitting: Psychiatry
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
## 2024-07-26 NOTE — Telephone Encounter (Signed)
Left third VM to RC.

## 2024-07-26 NOTE — Telephone Encounter (Signed)
 07/02/2024   1 Alprazolam  0.5 Mg Tablet 120.00  Is last fill   Unfortunately she cannot keep the alprazolam  secure living in the shelter, so I cannot send any more RX until a remedy can be found.   We are not in position to provide counseling for her anxiety management.  I suggest she contact [facility] .  I think they allow walk ins and might be able to get her a counselor or refer her.  My understanding is there's a difference between [facility] and [facility] but she can try both.

## 2024-07-26 NOTE — Telephone Encounter (Addendum)
 Pt left message that call was not returned yesterday. Left second VM to RC.

## 2024-07-27 NOTE — Telephone Encounter (Signed)
 Pt notified of recommendations. She reports she has a therapist and has an appt already.

## 2024-07-28 ENCOUNTER — Telehealth: Payer: Self-pay
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
## 2024-07-31 ENCOUNTER — Telehealth: Payer: Self-pay

## 2024-07-31 DIAGNOSIS — I10 Essential (primary) hypertension: Secondary | ICD-10-CM

## 2024-07-31 MED ORDER — ATENOLOL 25 MG PO TABS: 25.0000 mg | ORAL_TABLET | Freq: Two times a day (BID) | ORAL | 0 refills | Status: AC

## 2024-07-31 MED ORDER — THYROID 15 MG PO TABS: 45.0000 mg | ORAL_TABLET | Freq: Every day | ORAL | 0 refills | Status: AC

## 2024-07-31 NOTE — Telephone Encounter (Signed)
 Copied from CRM #[id number]. Topic: Clinical - Medication Refill >> Jul 31, 2024  9:25 AM Deleta RAMAN wrote: Medication: thyroid  (NP THYROID ) 15 MG tablet atenolol  (TENORMIN ) 25 MG tablet Has the patient contacted their pharmacy? Yes (Agent: If no, request that the patient contact the pharmacy for the refill. If patient does not wish to contact the pharmacy document the reason why and proceed with request.) (Agent: If yes, when and what did the pharmacy advise?)  This is the patient's preferred pharmacy:  [facility] [address] [place], [place] - [address] [place] [address] Phone: [phone] Fax: [phone]  Is this the correct pharmacy for this prescription? Yes If no, delete pharmacy and type the correct one.   Has the prescription been filled recently? Yes  Is the patient out of the medication? Yes  Has the patient been seen for an appointment in the last year OR does the patient have an upcoming appointment? Yes  Can we respond through MyChart? No  Agent: Please be advised that Rx refills may take up to 3 business days. We ask that you follow-up with your pharmacy.

## 2024-07-31 NOTE — Telephone Encounter (Signed)
 Rxs sent

## 2024-08-01 ENCOUNTER — Other Ambulatory Visit: Payer: Self-pay | Admitting: Family Medicine

## 2024-08-01 DIAGNOSIS — I10 Essential (primary) hypertension: Secondary | ICD-10-CM

## 2024-08-01 MED ORDER — IRBESARTAN 150 MG PO TABS: 150.0000 mg | ORAL_TABLET | Freq: Every day | ORAL | 2 refills | Status: DC

## 2024-08-03 ENCOUNTER — Other Ambulatory Visit: Payer: Self-pay

## 2024-08-03 DIAGNOSIS — I10 Essential (primary) hypertension: Secondary | ICD-10-CM

## 2024-08-03 MED ORDER — IRBESARTAN-HYDROCHLOROTHIAZIDE 150-12.5 MG PO TABS: 1.0000 | ORAL_TABLET | Freq: Every day | ORAL | 1 refills | Status: AC

## 2024-08-22 ENCOUNTER — Institutional Professional Consult (permissible substitution): Admitting: Neurology

## 2024-10-06 ENCOUNTER — Ambulatory Visit: Admitting: Family Medicine

## 2024-10-17 ENCOUNTER — Ambulatory Visit (INDEPENDENT_AMBULATORY_CARE_PROVIDER_SITE_OTHER): Admitting: Psychiatry

## 2024-12-26 ENCOUNTER — Ambulatory Visit (INDEPENDENT_AMBULATORY_CARE_PROVIDER_SITE_OTHER): Admitting: Psychiatry
